# Patient Record
Sex: Female | Born: 1980 | Race: White | Hispanic: No | Marital: Married | Smoking: Never smoker
Health system: Southern US, Community
[De-identification: ages and names within clinical notes are randomized; demographics above are authoritative.]

## PROBLEM LIST (undated history)

## (undated) ENCOUNTER — Emergency Department (HOSPITAL_COMMUNITY): Admission: EM | Discharge status: Against Medical Advice | Payer: 59

## (undated) DIAGNOSIS — N879 Dysplasia of cervix uteri, unspecified: Secondary | ICD-10-CM

## (undated) DIAGNOSIS — B3731 Acute candidiasis of vulva and vagina: Secondary | ICD-10-CM

## (undated) DIAGNOSIS — E669 Obesity, unspecified: Secondary | ICD-10-CM

## (undated) DIAGNOSIS — K219 Gastro-esophageal reflux disease without esophagitis: Secondary | ICD-10-CM

## (undated) DIAGNOSIS — B373 Candidiasis of vulva and vagina: Secondary | ICD-10-CM

## (undated) DIAGNOSIS — D649 Anemia, unspecified: Secondary | ICD-10-CM

## (undated) DIAGNOSIS — J189 Pneumonia, unspecified organism: Secondary | ICD-10-CM

## (undated) DIAGNOSIS — F419 Anxiety disorder, unspecified: Secondary | ICD-10-CM

## (undated) DIAGNOSIS — R002 Palpitations: Secondary | ICD-10-CM

## (undated) DIAGNOSIS — N390 Urinary tract infection, site not specified: Secondary | ICD-10-CM

## (undated) DIAGNOSIS — R42 Dizziness and giddiness: Secondary | ICD-10-CM

## (undated) DIAGNOSIS — R7303 Prediabetes: Secondary | ICD-10-CM

## (undated) DIAGNOSIS — Z8489 Family history of other specified conditions: Secondary | ICD-10-CM

## (undated) DIAGNOSIS — E559 Vitamin D deficiency, unspecified: Secondary | ICD-10-CM

## (undated) HISTORY — DX: Obesity, unspecified: E66.9

## (undated) HISTORY — DX: Vitamin D deficiency, unspecified: E55.9

## (undated) HISTORY — DX: Dysplasia of cervix uteri, unspecified: N87.9

## (undated) HISTORY — DX: Candidiasis of vulva and vagina: B37.3

## (undated) HISTORY — PX: TUBAL LIGATION: SHX77

## (undated) HISTORY — DX: Urinary tract infection, site not specified: N39.0

## (undated) HISTORY — DX: Anxiety disorder, unspecified: F41.9

## (undated) HISTORY — DX: Acute candidiasis of vulva and vagina: B37.31

## (undated) HISTORY — DX: Palpitations: R00.2

## (undated) HISTORY — PX: COLPOSCOPY: SHX161

---
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
## 2001-04-18 ENCOUNTER — Other Ambulatory Visit: Admission: RE | Admit: 2001-04-18 | Discharge: 2001-04-18 | Payer: Self-pay | Admitting: Gynecology

## 2002-05-02 ENCOUNTER — Other Ambulatory Visit: Admission: RE | Admit: 2002-05-02 | Discharge: 2002-05-02 | Payer: Self-pay | Admitting: Gynecology

## 2003-05-07 ENCOUNTER — Other Ambulatory Visit: Admission: RE | Admit: 2003-05-07 | Discharge: 2003-05-07 | Payer: Self-pay | Admitting: Gynecology

## 2004-06-03 ENCOUNTER — Other Ambulatory Visit: Admission: RE | Admit: 2004-06-03 | Discharge: 2004-06-03 | Payer: Self-pay | Admitting: Gynecology

## 2005-06-08 ENCOUNTER — Other Ambulatory Visit: Admission: RE | Admit: 2005-06-08 | Discharge: 2005-06-08 | Payer: Self-pay | Admitting: Gynecology

## 2005-12-27 ENCOUNTER — Encounter (INDEPENDENT_AMBULATORY_CARE_PROVIDER_SITE_OTHER): Payer: Self-pay | Admitting: *Deleted

## 2005-12-27 ENCOUNTER — Inpatient Hospital Stay (HOSPITAL_COMMUNITY): Admission: RE | Admit: 2005-12-27 | Discharge: 2005-12-30 | Payer: Self-pay | Admitting: Obstetrics & Gynecology

## 2005-12-31 ENCOUNTER — Encounter: Admission: RE | Admit: 2005-12-31 | Discharge: 2006-01-08 | Payer: Self-pay | Admitting: Gynecology

## 2006-02-07 ENCOUNTER — Other Ambulatory Visit: Admission: RE | Admit: 2006-02-07 | Discharge: 2006-02-07 | Payer: Self-pay | Admitting: Gynecology

## 2007-03-25 ENCOUNTER — Other Ambulatory Visit: Admission: RE | Admit: 2007-03-25 | Discharge: 2007-03-25 | Payer: Self-pay | Admitting: Gynecology

## 2007-09-17 ENCOUNTER — Emergency Department (HOSPITAL_COMMUNITY): Admission: EM | Admit: 2007-09-17 | Discharge: 2007-09-17 | Payer: Self-pay | Admitting: Emergency Medicine

## 2007-09-19 ENCOUNTER — Other Ambulatory Visit: Admission: RE | Admit: 2007-09-19 | Discharge: 2007-09-19 | Payer: Self-pay | Admitting: Gynecology

## 2007-12-22 ENCOUNTER — Emergency Department (HOSPITAL_COMMUNITY): Admission: EM | Admit: 2007-12-22 | Discharge: 2007-12-22 | Payer: Self-pay | Admitting: Family Medicine

## 2008-04-09 ENCOUNTER — Other Ambulatory Visit: Admission: RE | Admit: 2008-04-09 | Discharge: 2008-04-09 | Payer: Self-pay | Admitting: Gynecology

## 2008-04-10 ENCOUNTER — Emergency Department (HOSPITAL_COMMUNITY): Admission: EM | Admit: 2008-04-10 | Discharge: 2008-04-11 | Payer: Self-pay | Admitting: Emergency Medicine

## 2008-09-11 ENCOUNTER — Ambulatory Visit: Payer: Self-pay | Admitting: Gynecology

## 2008-10-08 ENCOUNTER — Ambulatory Visit: Payer: Self-pay | Admitting: Women's Health

## 2009-01-08 ENCOUNTER — Ambulatory Visit: Payer: Self-pay | Admitting: Women's Health

## 2009-02-15 ENCOUNTER — Ambulatory Visit: Payer: Self-pay | Admitting: Women's Health

## 2009-04-02 ENCOUNTER — Ambulatory Visit: Payer: Self-pay | Admitting: Women's Health

## 2009-04-06 ENCOUNTER — Ambulatory Visit: Payer: Self-pay | Admitting: Women's Health

## 2009-04-09 ENCOUNTER — Ambulatory Visit: Payer: Self-pay | Admitting: Women's Health

## 2009-04-14 ENCOUNTER — Ambulatory Visit: Payer: Self-pay | Admitting: Gynecology

## 2009-04-21 ENCOUNTER — Inpatient Hospital Stay (HOSPITAL_COMMUNITY): Admission: AD | Admit: 2009-04-21 | Discharge: 2009-04-21 | Payer: Self-pay | Admitting: Obstetrics and Gynecology

## 2009-04-28 ENCOUNTER — Inpatient Hospital Stay (HOSPITAL_COMMUNITY): Admission: AD | Admit: 2009-04-28 | Discharge: 2009-04-28 | Payer: Self-pay | Admitting: Obstetrics and Gynecology

## 2009-11-08 ENCOUNTER — Ambulatory Visit: Payer: Self-pay | Admitting: Women's Health

## 2009-11-10 ENCOUNTER — Ambulatory Visit: Payer: Self-pay | Admitting: Women's Health

## 2009-12-10 ENCOUNTER — Ambulatory Visit: Payer: Self-pay | Admitting: Women's Health

## 2010-03-03 ENCOUNTER — Ambulatory Visit (HOSPITAL_COMMUNITY): Admission: RE | Admit: 2010-03-03 | Discharge: 2010-03-03 | Payer: Self-pay | Admitting: Obstetrics and Gynecology

## 2010-04-01 ENCOUNTER — Ambulatory Visit (HOSPITAL_COMMUNITY): Admission: RE | Admit: 2010-04-01 | Discharge: 2010-04-01 | Payer: Self-pay | Admitting: Obstetrics and Gynecology

## 2010-06-04 ENCOUNTER — Ambulatory Visit: Payer: Self-pay | Admitting: Nurse Practitioner

## 2010-06-04 ENCOUNTER — Inpatient Hospital Stay (HOSPITAL_COMMUNITY): Admission: AD | Admit: 2010-06-04 | Discharge: 2010-06-04 | Payer: Self-pay | Admitting: Obstetrics and Gynecology

## 2010-06-20 ENCOUNTER — Inpatient Hospital Stay (HOSPITAL_COMMUNITY): Admission: AD | Admit: 2010-06-20 | Discharge: 2010-06-20 | Payer: Self-pay | Admitting: Obstetrics and Gynecology

## 2010-06-23 ENCOUNTER — Inpatient Hospital Stay (HOSPITAL_COMMUNITY): Admission: AD | Admit: 2010-06-23 | Discharge: 2010-06-23 | Payer: Self-pay | Admitting: Obstetrics and Gynecology

## 2010-06-28 ENCOUNTER — Inpatient Hospital Stay (HOSPITAL_COMMUNITY): Admission: AD | Admit: 2010-06-28 | Discharge: 2010-06-28 | Payer: Self-pay | Admitting: Obstetrics and Gynecology

## 2010-06-28 DIAGNOSIS — O479 False labor, unspecified: Secondary | ICD-10-CM

## 2010-06-29 ENCOUNTER — Inpatient Hospital Stay (HOSPITAL_COMMUNITY): Admission: RE | Admit: 2010-06-29 | Discharge: 2010-07-02 | Payer: Self-pay | Admitting: Obstetrics and Gynecology

## 2010-07-02 ENCOUNTER — Encounter: Admission: RE | Admit: 2010-07-02 | Discharge: 2010-08-01 | Payer: Self-pay | Admitting: Obstetrics and Gynecology

## 2010-07-05 ENCOUNTER — Ambulatory Visit: Admission: RE | Admit: 2010-07-05 | Discharge: 2010-07-05 | Payer: Self-pay | Admitting: Obstetrics and Gynecology

## 2010-08-02 ENCOUNTER — Encounter: Admission: RE | Admit: 2010-08-02 | Discharge: 2010-08-02 | Payer: Self-pay | Admitting: Obstetrics and Gynecology

## 2010-09-02 ENCOUNTER — Encounter: Admission: RE | Admit: 2010-09-02 | Discharge: 2010-09-19 | Payer: Self-pay | Admitting: Obstetrics and Gynecology

## 2011-01-20 LAB — CBC
HCT: 27.2 % — ABNORMAL LOW (ref 36.0–46.0)
HCT: 33.3 % — ABNORMAL LOW (ref 36.0–46.0)
Hemoglobin: 11.2 g/dL — ABNORMAL LOW (ref 12.0–15.0)
Hemoglobin: 9.3 g/dL — ABNORMAL LOW (ref 12.0–15.0)
MCH: 30 pg (ref 26.0–34.0)
MCH: 30.2 pg (ref 26.0–34.0)
MCHC: 33.7 g/dL (ref 30.0–36.0)
MCHC: 34.1 g/dL (ref 30.0–36.0)
MCV: 88.6 fL (ref 78.0–100.0)
MCV: 89 fL (ref 78.0–100.0)
Platelets: 202 10*3/uL (ref 150–400)
Platelets: 247 10*3/uL (ref 150–400)
RBC: 3.07 MIL/uL — ABNORMAL LOW (ref 3.87–5.11)
RBC: 3.74 MIL/uL — ABNORMAL LOW (ref 3.87–5.11)
RDW: 15.1 % (ref 11.5–15.5)
RDW: 15.3 % (ref 11.5–15.5)
WBC: 10.4 10*3/uL (ref 4.0–10.5)
WBC: 9.3 10*3/uL (ref 4.0–10.5)

## 2011-01-20 LAB — RPR: RPR Ser Ql: NONREACTIVE

## 2011-01-20 LAB — SURGICAL PCR SCREEN
MRSA, PCR: NEGATIVE
Staphylococcus aureus: NEGATIVE

## 2011-01-21 LAB — GLUCOSE, CAPILLARY: Glucose-Capillary: 119 mg/dL — ABNORMAL HIGH (ref 70–99)

## 2011-01-21 LAB — BASIC METABOLIC PANEL
BUN: 5 mg/dL — ABNORMAL LOW (ref 6–23)
CO2: 25 mEq/L (ref 19–32)
Calcium: 8.8 mg/dL (ref 8.4–10.5)
Chloride: 105 mEq/L (ref 96–112)
Creatinine, Ser: 0.48 mg/dL (ref 0.4–1.2)
GFR calc Af Amer: >60 mL/min (ref >60)
GFR calc non Af Amer: >60 mL/min (ref >60)
Glucose, Bld: 116 mg/dL — ABNORMAL HIGH (ref 70–99)
Potassium: 3.7 mEq/L (ref 3.5–5.1)
Sodium: 135 mEq/L (ref 135–145)

## 2011-01-21 LAB — URINALYSIS, ROUTINE W REFLEX MICROSCOPIC
Bilirubin Urine: NEGATIVE
Glucose, UA: NEGATIVE mg/dL
Ketones, ur: 15 mg/dL — AB
Leukocytes, UA: NEGATIVE
Nitrite: NEGATIVE
Protein, ur: NEGATIVE mg/dL
Specific Gravity, Urine: 1.015 (ref 1.005–1.030)
Urobilinogen, UA: 0.2 mg/dL (ref 0.0–1.0)
pH: 7 (ref 5.0–8.0)

## 2011-01-21 LAB — CBC
HCT: 31 % — ABNORMAL LOW (ref 36.0–46.0)
Hemoglobin: 10.6 g/dL — ABNORMAL LOW (ref 12.0–15.0)
MCH: 30.1 pg (ref 26.0–34.0)
MCHC: 34.1 g/dL (ref 30.0–36.0)
MCV: 88.5 fL (ref 78.0–100.0)
Platelets: 245 10*3/uL (ref 150–400)
RBC: 3.5 MIL/uL — ABNORMAL LOW (ref 3.87–5.11)
RDW: 14.8 % (ref 11.5–15.5)
WBC: 9.9 10*3/uL (ref 4.0–10.5)

## 2011-01-21 LAB — URINE MICROSCOPIC-ADD ON

## 2011-02-13 LAB — CBC
HCT: 34.2 % — ABNORMAL LOW (ref 36.0–46.0)
HCT: 35.4 % — ABNORMAL LOW (ref 36.0–46.0)
Hemoglobin: 11.9 g/dL — ABNORMAL LOW (ref 12.0–15.0)
Hemoglobin: 12.3 g/dL (ref 12.0–15.0)
MCHC: 34.7 g/dL (ref 30.0–36.0)
MCHC: 34.9 g/dL (ref 30.0–36.0)
MCV: 90.8 fL (ref 78.0–100.0)
MCV: 92.5 fL (ref 78.0–100.0)
Platelets: 277 10*3/uL (ref 150–400)
Platelets: 283 10*3/uL (ref 150–400)
RBC: 3.76 MIL/uL — ABNORMAL LOW (ref 3.87–5.11)
RBC: 3.83 MIL/uL — ABNORMAL LOW (ref 3.87–5.11)
RDW: 13 % (ref 11.5–15.5)
RDW: 13.5 % (ref 11.5–15.5)
WBC: 10.7 10*3/uL — ABNORMAL HIGH (ref 4.0–10.5)
WBC: 9.8 10*3/uL (ref 4.0–10.5)

## 2011-02-13 LAB — BUN: BUN: 5 mg/dL — ABNORMAL LOW (ref 6–23)

## 2011-02-13 LAB — COMPREHENSIVE METABOLIC PANEL
ALT: 18 U/L (ref 0–35)
AST: 20 U/L (ref 0–37)
Albumin: 3.8 g/dL (ref 3.5–5.2)
Alkaline Phosphatase: 67 U/L (ref 39–117)
BUN: 6 mg/dL (ref 6–23)
CO2: 26 mEq/L (ref 19–32)
Calcium: 9 mg/dL (ref 8.4–10.5)
Chloride: 107 mEq/L (ref 96–112)
Creatinine, Ser: 0.69 mg/dL (ref 0.4–1.2)
GFR calc Af Amer: >60 mL/min (ref >60)
GFR calc non Af Amer: >60 mL/min (ref >60)
Glucose, Bld: 90 mg/dL (ref 70–99)
Potassium: 3.9 mEq/L (ref 3.5–5.1)
Sodium: 138 mEq/L (ref 135–145)
Total Bilirubin: 0.6 mg/dL (ref 0.3–1.2)
Total Protein: 6.7 g/dL (ref 6.0–8.3)

## 2011-02-13 LAB — DIFFERENTIAL
Basophils Absolute: 0 10*3/uL (ref 0.0–0.1)
Basophils Absolute: 0.1 10*3/uL (ref 0.0–0.1)
Basophils Relative: 0 % (ref 0–1)
Basophils Relative: 1 % (ref 0–1)
Eosinophils Absolute: 0.1 10*3/uL (ref 0.0–0.7)
Eosinophils Absolute: 0.2 10*3/uL (ref 0.0–0.7)
Eosinophils Relative: 1 % (ref 0–5)
Eosinophils Relative: 2 % (ref 0–5)
Lymphocytes Relative: 26 % (ref 12–46)
Lymphocytes Relative: 30 % (ref 12–46)
Lymphs Abs: 2.8 10*3/uL (ref 0.7–4.0)
Lymphs Abs: 2.9 10*3/uL (ref 0.7–4.0)
Monocytes Absolute: 0.4 10*3/uL (ref 0.1–1.0)
Monocytes Absolute: 0.4 10*3/uL (ref 0.1–1.0)
Monocytes Relative: 4 % (ref 3–12)
Monocytes Relative: 5 % (ref 3–12)
Neutro Abs: 6.2 10*3/uL (ref 1.7–7.7)
Neutro Abs: 7.3 10*3/uL (ref 1.7–7.7)
Neutrophils Relative %: 64 % (ref 43–77)
Neutrophils Relative %: 68 % (ref 43–77)

## 2011-02-13 LAB — CREATININE, SERUM
Creatinine, Ser: 0.67 mg/dL (ref 0.4–1.2)
GFR calc Af Amer: >60 mL/min (ref >60)
GFR calc non Af Amer: >60 mL/min (ref >60)

## 2011-02-13 LAB — ABO/RH: ABO/RH(D): A POS

## 2011-02-13 LAB — HCG, QUANTITATIVE, PREGNANCY: hCG, Beta Chain, Quant, S: 252 m[IU]/mL — ABNORMAL HIGH (ref <5)

## 2011-02-13 LAB — AST: AST: 24 U/L (ref 0–37)

## 2011-02-17 ENCOUNTER — Ambulatory Visit (INDEPENDENT_AMBULATORY_CARE_PROVIDER_SITE_OTHER): Payer: 59 | Admitting: Women's Health

## 2011-02-17 DIAGNOSIS — N644 Mastodynia: Secondary | ICD-10-CM

## 2011-02-17 DIAGNOSIS — N949 Unspecified condition associated with female genital organs and menstrual cycle: Secondary | ICD-10-CM

## 2011-03-24 NOTE — Discharge Summary (Signed)
NAME:  Julie Mcneil, Julie Mcneil              ACCOUNT NO.:  [id number]   MEDICAL RECORD NO.:  [id number]          PATIENT TYPE:  INP   LOCATION:  9130                          FACILITY:  [facility]   PHYSICIAN:  Ivor Costa. Farrel Gobble, M.D. DATE OF BIRTH:  06-03-1981   DATE OF ADMISSION:  12/27/2005  DATE OF DISCHARGE:  12/30/2005                                 DISCHARGE SUMMARY   PRINCIPAL DIAGNOSIS:  Macrosomia.   PRINCIPAL PROCEDURE:  Elective primary cesarean section.   HOSPITAL COURSE:  The patient presented in the afternoon of December 27, 2005 for an elective primary cesarean section secondary to suspected  macrosomia that was picked up on a 33-week ultrasound; at  which point, the  infant was noted to be 6 pounds 4 ounces and greater than 97th percentile in  almost all the diameters. She underwent the cesarean under spinal anesthesia  for delivery of a viable female with a floating vertex. Apgar's 9/9, birth  weight 10 pounds 2 ounces. Normal uterus, tubes and ovaries with an  estimated blood loss of approximately 600 cc.   Her postpartum course was unremarkable. By postpartum day #3, the patient  was ready for discharge. She was breast-feeding without any difficulty. Her  pain was well-controlled with oral analgesia. She was walking without any  problem and was tolerating regular diet.   She remained afebrile and her vitals were stable throughout. On examination,  she was well-appearing. Her abdomen was obese, soft and nontender. Incision  was clean, dry, intact with both staples and Steri-Strips. Her uterus was  difficult to palpate secondary to habitus. The extremities were negative.  The patient was discharged home with instructions to follow up in the office  in six weeks. She had been given Tylox preoperatively. She will use over-the-  counter Motrin as needed. Discharge instructions were reviewed with the  patient.      Ivor Costa. Farrel Gobble, M.D.  Electronically Signed     THL/MEDQ   D:  12/30/2005  T:  12/30/2005  Job:  [id number]

## 2011-03-24 NOTE — Op Note (Signed)
NAME:  Julie Mcneil, Julie Mcneil              ACCOUNT NO.:  [id number]   MEDICAL RECORD NO.:  [id number]          PATIENT TYPE:  INP   LOCATION:  9130                          FACILITY:  [facility]   PHYSICIAN:  Ivor Costa. Farrel Gobble, M.D. DATE OF BIRTH:  1980-12-27   DATE OF PROCEDURE:  12/27/2005  DATE OF DISCHARGE:                                 OPERATIVE REPORT   PREOPERATIVE DIAGNOSIS:  Macrosomia.   POSTOPERATIVE DIAGNOSIS:  Macrosomia.   OPERATION/PROCEDURE:  Primary cesarean section, low flap, transverse.   SURGEON:  Ivor Costa. Farrel Gobble, M.D.   ASSISTANTMarcial Pacas P. Fontaine, M.D.   ANESTHESIA:  Spinal.   FLUIDS REPLACED:  2800 mL lactated Ringer's.   ESTIMATED BLOOD LOSS:  600 mL.   URINE OUTPUT:  100 mL of clear urine.   FINDINGS:  Viable female with a floating vertex, clear amniotic fluid.  Apgars 9 and 9, birth weight 10 pounds 2 ounces.  Normal uterus, tubes and  ovaries.   COMPLICATIONS:  None.   DESCRIPTION OF PROCEDURE:  The patient was taken to the operating room,  spinal anesthesia induced, placed in the supine position with left lateral  displacement, prepped and draped in the usual sterile fashion.  A  Pfannenstiel skin incision was made with scalpel and carried through to the  fascia which was scored in the midline and extended laterally.  The inferior  aspect of the fascial incision was grasped with the Kochers.  Underlying  rectus muscles were dissected off with blunt and sharp dissection.  In  similar fashion, the superior aspect of incision was grasped with Kochers  and underlying rectus muscles were dissected off.  The rectus muscles were  naturally separated in the midline.  The peritoneum was identified and  entered bluntly.  The incision was then extended superiorly and inferiorly.  Good visualization of the underlying bowel and bladder.  Bladder blade was  inserted.  The vesicouterine peritoneum was identified, tented up and  entered sharply with the Metzenbaums.   Bladder flap was created digitally.  The bladder blade was reinserted and the lower uterine segment was incised  in a transverse fashion with the scalpel.  Copious amount of clear amniotic  fluid was noted.  The infant was delivered with the aid of baby Elliots when  the vertex started floating away.  The cord was cut and clamped and the  infant handed off to the waiting pediatricians.  Cord bloods were obtained.  The uterus was massaged and placenta was allowed to separate naturally.  The  uterus was then cleared of all clots and debris.  The uterine incision was  repaired with running locked layer of 0 chromic and a second suture was used  for imbrication.  The pelvis was irrigated with copious amounts of warm  saline.  The adnexa were inspected and noted to be unremarkable as was the  remainder of the pelvis.  The fascia, peritoneum and muscles were inspected  and were noted to be hemostatic.  The fascia was closed with 0 Vicryl in a  running fashion.  The subcutaneous tissue was reapproximated with 3-0 plain.  Skin was closed  with staples.  The patient tolerated the procedure well.  Sponge, lap and  needle counts were correct x2.  She was transferred to the PACU in stable  condition.      Ivor Costa. Farrel Gobble, M.D.  Electronically Signed     THL/MEDQ  D:  12/27/2005  T:  12/28/2005  Job:  [id number]

## 2011-04-04 ENCOUNTER — Ambulatory Visit (INDEPENDENT_AMBULATORY_CARE_PROVIDER_SITE_OTHER): Payer: 59 | Admitting: Gynecology

## 2011-04-04 DIAGNOSIS — N912 Amenorrhea, unspecified: Secondary | ICD-10-CM

## 2011-04-06 ENCOUNTER — Other Ambulatory Visit (INDEPENDENT_AMBULATORY_CARE_PROVIDER_SITE_OTHER): Payer: 59

## 2011-04-06 DIAGNOSIS — O9989 Other specified diseases and conditions complicating pregnancy, childbirth and the puerperium: Secondary | ICD-10-CM

## 2011-04-07 ENCOUNTER — Other Ambulatory Visit: Payer: 59

## 2011-04-12 ENCOUNTER — Other Ambulatory Visit (INDEPENDENT_AMBULATORY_CARE_PROVIDER_SITE_OTHER): Payer: 59

## 2011-04-12 DIAGNOSIS — N912 Amenorrhea, unspecified: Secondary | ICD-10-CM

## 2011-04-14 ENCOUNTER — Ambulatory Visit (INDEPENDENT_AMBULATORY_CARE_PROVIDER_SITE_OTHER): Payer: 59 | Admitting: Gynecology

## 2011-04-14 ENCOUNTER — Inpatient Hospital Stay (HOSPITAL_COMMUNITY)
Admission: AD | Admit: 2011-04-14 | Discharge: 2011-04-14 | Disposition: A | Discharge status: Home / Self Care | Payer: 59 | Source: Ambulatory Visit | Attending: Obstetrics and Gynecology | Admitting: Obstetrics and Gynecology

## 2011-04-14 ENCOUNTER — Inpatient Hospital Stay (HOSPITAL_COMMUNITY): Payer: 59

## 2011-04-14 DIAGNOSIS — O9989 Other specified diseases and conditions complicating pregnancy, childbirth and the puerperium: Secondary | ICD-10-CM

## 2011-04-14 DIAGNOSIS — O200 Threatened abortion: Secondary | ICD-10-CM

## 2011-04-14 DIAGNOSIS — O99891 Other specified diseases and conditions complicating pregnancy: Secondary | ICD-10-CM | POA: Insufficient documentation

## 2011-04-14 DIAGNOSIS — R1031 Right lower quadrant pain: Secondary | ICD-10-CM | POA: Insufficient documentation

## 2011-04-14 LAB — URINALYSIS, ROUTINE W REFLEX MICROSCOPIC
Bilirubin Urine: NEGATIVE
Glucose, UA: NEGATIVE mg/dL
Ketones, ur: NEGATIVE mg/dL
Leukocytes, UA: NEGATIVE
Nitrite: NEGATIVE
Protein, ur: NEGATIVE mg/dL
Specific Gravity, Urine: >1.03 — ABNORMAL HIGH (ref 1.005–1.030)
Urobilinogen, UA: 0.2 mg/dL (ref 0.0–1.0)
pH: 5.5 (ref 5.0–8.0)

## 2011-04-14 LAB — URINE MICROSCOPIC-ADD ON

## 2011-04-14 LAB — POCT PREGNANCY, URINE: Preg Test, Ur: POSITIVE

## 2011-04-21 ENCOUNTER — Other Ambulatory Visit: Payer: 59

## 2011-04-21 ENCOUNTER — Ambulatory Visit (INDEPENDENT_AMBULATORY_CARE_PROVIDER_SITE_OTHER): Payer: 59 | Admitting: Women's Health

## 2011-04-21 DIAGNOSIS — O26849 Uterine size-date discrepancy, unspecified trimester: Secondary | ICD-10-CM

## 2011-04-21 DIAGNOSIS — N912 Amenorrhea, unspecified: Secondary | ICD-10-CM

## 2011-04-24 ENCOUNTER — Ambulatory Visit: Payer: 59 | Admitting: Women's Health

## 2011-04-24 ENCOUNTER — Other Ambulatory Visit: Payer: 59

## 2011-05-01 LAB — RUBELLA ANTIBODY, IGM: Rubella: IMMUNE

## 2011-05-01 LAB — ABO/RH: RH Type: POSITIVE

## 2011-05-01 LAB — ANTIBODY SCREEN: Antibody Screen: NEGATIVE

## 2011-05-01 LAB — RPR: RPR: NONREACTIVE

## 2011-05-01 LAB — HIV ANTIBODY (ROUTINE TESTING W REFLEX): HIV: NONREACTIVE

## 2011-05-01 LAB — HEPATITIS B SURFACE ANTIGEN: Hepatitis B Surface Ag: NEGATIVE

## 2011-05-11 ENCOUNTER — Inpatient Hospital Stay (HOSPITAL_COMMUNITY)
Admission: AD | Admit: 2011-05-11 | Discharge: 2011-05-11 | Disposition: A | Discharge status: Home / Self Care | Payer: 59 | Source: Ambulatory Visit | Attending: Obstetrics and Gynecology | Admitting: Obstetrics and Gynecology

## 2011-05-11 ENCOUNTER — Inpatient Hospital Stay (HOSPITAL_COMMUNITY): Payer: 59

## 2011-05-11 DIAGNOSIS — O9989 Other specified diseases and conditions complicating pregnancy, childbirth and the puerperium: Secondary | ICD-10-CM

## 2011-05-11 DIAGNOSIS — R109 Unspecified abdominal pain: Secondary | ICD-10-CM

## 2011-05-11 DIAGNOSIS — O99891 Other specified diseases and conditions complicating pregnancy: Secondary | ICD-10-CM | POA: Insufficient documentation

## 2011-05-11 LAB — URINALYSIS, ROUTINE W REFLEX MICROSCOPIC
Bilirubin Urine: NEGATIVE
Glucose, UA: NEGATIVE mg/dL
Ketones, ur: NEGATIVE mg/dL
Leukocytes, UA: NEGATIVE
Nitrite: NEGATIVE
Protein, ur: NEGATIVE mg/dL
Specific Gravity, Urine: >1.03 — ABNORMAL HIGH (ref 1.005–1.030)
Urobilinogen, UA: 0.2 mg/dL (ref 0.0–1.0)
pH: 6 (ref 5.0–8.0)

## 2011-05-11 LAB — URINE MICROSCOPIC-ADD ON

## 2011-06-12 ENCOUNTER — Encounter (HOSPITAL_COMMUNITY): Payer: Self-pay | Admitting: *Deleted

## 2011-06-12 ENCOUNTER — Inpatient Hospital Stay (HOSPITAL_COMMUNITY)
Admission: AD | Admit: 2011-06-12 | Discharge: 2011-06-13 | Disposition: A | Discharge status: Home / Self Care | Payer: 59 | Source: Ambulatory Visit | Attending: Obstetrics and Gynecology | Admitting: Obstetrics and Gynecology

## 2011-06-12 DIAGNOSIS — O99891 Other specified diseases and conditions complicating pregnancy: Secondary | ICD-10-CM | POA: Insufficient documentation

## 2011-06-12 DIAGNOSIS — R51 Headache: Secondary | ICD-10-CM | POA: Insufficient documentation

## 2011-06-12 DIAGNOSIS — J329 Chronic sinusitis, unspecified: Secondary | ICD-10-CM

## 2011-06-12 DIAGNOSIS — O210 Mild hyperemesis gravidarum: Secondary | ICD-10-CM | POA: Insufficient documentation

## 2011-06-12 DIAGNOSIS — R109 Unspecified abdominal pain: Secondary | ICD-10-CM | POA: Insufficient documentation

## 2011-06-12 DIAGNOSIS — O219 Vomiting of pregnancy, unspecified: Secondary | ICD-10-CM

## 2011-06-12 LAB — COMPREHENSIVE METABOLIC PANEL
ALT: 8 U/L (ref 0–35)
AST: 9 U/L (ref 0–37)
Albumin: 3.1 g/dL — ABNORMAL LOW (ref 3.5–5.2)
Alkaline Phosphatase: 99 U/L (ref 39–117)
BUN: 13 mg/dL (ref 6–23)
CO2: 27 mEq/L (ref 19–32)
Calcium: 10.2 mg/dL (ref 8.4–10.5)
Chloride: 100 mEq/L (ref 96–112)
Creatinine, Ser: 0.63 mg/dL (ref 0.50–1.10)
GFR calc Af Amer: >60 mL/min (ref >60)
GFR calc non Af Amer: >60 mL/min (ref >60)
Glucose, Bld: 97 mg/dL (ref 70–99)
Potassium: 4 mEq/L (ref 3.5–5.1)
Sodium: 137 mEq/L (ref 135–145)
Total Bilirubin: 0.3 mg/dL (ref 0.3–1.2)
Total Protein: 7.2 g/dL (ref 6.0–8.3)

## 2011-06-12 LAB — URINE MICROSCOPIC-ADD ON

## 2011-06-12 LAB — DIFFERENTIAL
Basophils Absolute: 0 10*3/uL (ref 0.0–0.1)
Basophils Relative: 0 % (ref 0–1)
Eosinophils Absolute: 0.2 10*3/uL (ref 0.0–0.7)
Eosinophils Relative: 1 % (ref 0–5)
Lymphocytes Relative: 20 % (ref 12–46)
Lymphs Abs: 2.8 10*3/uL (ref 0.7–4.0)
Monocytes Absolute: 0.5 10*3/uL (ref 0.1–1.0)
Monocytes Relative: 4 % (ref 3–12)
Neutro Abs: 10.7 10*3/uL — ABNORMAL HIGH (ref 1.7–7.7)
Neutrophils Relative %: 75 % (ref 43–77)

## 2011-06-12 LAB — CBC
HCT: 34.1 % — ABNORMAL LOW (ref 36.0–46.0)
Hemoglobin: 11.7 g/dL — ABNORMAL LOW (ref 12.0–15.0)
MCH: 30.6 pg (ref 26.0–34.0)
MCHC: 34.3 g/dL (ref 30.0–36.0)
MCV: 89.3 fL (ref 78.0–100.0)
Platelets: 261 10*3/uL (ref 150–400)
RBC: 3.82 MIL/uL — ABNORMAL LOW (ref 3.87–5.11)
RDW: 13.6 % (ref 11.5–15.5)
WBC: 14.3 10*3/uL — ABNORMAL HIGH (ref 4.0–10.5)

## 2011-06-12 LAB — URINALYSIS, ROUTINE W REFLEX MICROSCOPIC
Bilirubin Urine: NEGATIVE
Glucose, UA: NEGATIVE mg/dL
Ketones, ur: NEGATIVE mg/dL
Leukocytes, UA: NEGATIVE
Nitrite: NEGATIVE
Protein, ur: NEGATIVE mg/dL
Specific Gravity, Urine: >1.03 — ABNORMAL HIGH (ref 1.005–1.030)
Urobilinogen, UA: 0.2 mg/dL (ref 0.0–1.0)
pH: 6 (ref 5.0–8.0)

## 2011-06-12 MED ORDER — ONDANSETRON 8 MG PO TBDP
8.0000 mg | ORAL_TABLET | Freq: Once | ORAL | Status: AC
  Administered 2011-06-12: 8 mg via ORAL
  Filled 2011-06-12: qty 1

## 2011-06-12 MED ORDER — ONDANSETRON HCL 4 MG/2ML IJ SOLN: 4.0000 mg | Freq: Once | INTRAMUSCULAR | Status: DC

## 2011-06-12 MED ORDER — ACETAMINOPHEN 500 MG PO TABS
1000.0000 mg | ORAL_TABLET | Freq: Once | ORAL | Status: AC
  Administered 2011-06-13: 1000 mg via ORAL
  Filled 2011-06-12: qty 2

## 2011-06-12 MED ORDER — LACTATED RINGERS IV SOLN: INTRAVENOUS | Status: DC

## 2011-06-12 NOTE — ED Provider Notes (Addendum)
History     CSN: [id number] Arrival date & time: 06/12/2011  8:51 PM  Chief Complaint  Patient presents with  . Headache  . Abdominal Cramping   HPI Julie Mcneil is a 30 y.o. WF at 14.[redacted] weeks gestation presents to MAU with a headache that started 4 days ago. Took tylenol and helped a little but seems to get worse at night. Also took tylenol sinus. Tonight began feeling dizzy, nausea, and lower abdominal cramping while outside.     Past Medical History  Diagnosis Date  . Vaginal yeast infection     recurrent  . UTI (urinary tract infection)     recurrent    Past Surgical History  Procedure Date  . Cesarean section     No family history on file.  History  Substance Use Topics  . Smoking status: Never Smoker   . Smokeless tobacco: Not on file  . Alcohol Use: No    OB History    Grav Para Term Preterm Abortions TAB SAB Ect Mult Living   4 2 2  1   1  2       Review of Systems  Constitutional: Positive for fever and fatigue. Negative for chills.  HENT: Positive for sinus pressure. Negative for ear pain, facial swelling and neck pain.   Eyes: Negative.   Respiratory: Negative for cough and wheezing.   Genitourinary: Negative for vaginal bleeding, vaginal discharge and pelvic pain.    Physical Exam  BP 109/70  Pulse 101  Temp(Src) 98.7 F (37.1 C) (Oral)  Resp 16  Ht 5\' 4"  (1.626 m)  Wt 239 lb 6.4 oz (108.591 kg)  BMI 41.09 kg/m2  Physical Exam  Nursing note and vitals reviewed. Constitutional: She is oriented to person, place, and time. She appears well-developed and well-nourished.  HENT:  Head: Normocephalic and atraumatic.         Tenderness over maxillary and frontal sinuses on the left.  Eyes: EOM are normal. Pupils are equal, round, and reactive to light.  Neck: Neck supple.  Cardiovascular: Normal rate and regular rhythm.   Pulmonary/Chest: No respiratory distress. She has no wheezes.  Abdominal: Soft. There is no tenderness.       Gravid,  fundal ht. Consistent with dates. +FHT  Musculoskeletal: Normal range of motion. She exhibits no edema.  Neurological: She is alert and oriented to person, place, and time. No cranial nerve deficit.  Skin: Skin is warm and dry.  Psychiatric: She has a normal mood and affect.    ED Course  Procedures Urine SG > 30.  CBC, CMET, WNL  Assessment: Sinusitis                        Low abdominal cramping in 2nd trimester pregnancy                        Nausea                        Headache  Plan:     Consult with Dr. Henderson Cloud               Tylenol for headache                Rx:       Zofran 8 mg. Po bid, prn             Amoxicillin 500 mg. Po tid x  10 days             Increase po fluids             Follow up in the office.                      [place], [place] 06/13/11 [phone]

## 2011-06-12 NOTE — Progress Notes (Signed)
PT SAYS SHE HAS HAD H/A  FOR 5 DAYS AND AT NIGHT AND SINCE STORM - IT IS WORSE AND  ACROSS FOREHEAD.   FEELS NAUSEA BECAUSE OF H/A     . CRAMPS STARTED  AT 6PM- WHILE SHE WAS OUTSIDE- WENT AND LAYED DOWN-  HELPED SOME. - CRAMPING LESS NOW.   CALLED OFFICE ON Friday ABOUT H/A-  TOLD HER TO TAKE TYLENOL SINUS-    HAS HELPED SOME- IN EVENING H/A WORSE.

## 2011-06-12 NOTE — Progress Notes (Signed)
PT REFUSED IV - DOES NOT LIKE NEEDLES-  AND HAD RATHER  NOT HAVE IV

## 2011-06-12 NOTE — Progress Notes (Signed)
Pt G4 P2 at 15wks, having headache x 5 days and lower abd cramping since 1800.  Denies bleeding or discharge.

## 2011-06-13 MED ORDER — AMOXICILLIN 500 MG PO CAPS: 500.0000 mg | ORAL_CAPSULE | Freq: Three times a day (TID) | ORAL | Status: AC

## 2011-06-13 MED ORDER — ONDANSETRON HCL 4 MG PO TABS: 4.0000 mg | ORAL_TABLET | Freq: Four times a day (QID) | ORAL | Status: AC

## 2011-07-07 ENCOUNTER — Encounter (HOSPITAL_COMMUNITY): Payer: Self-pay

## 2011-07-07 ENCOUNTER — Inpatient Hospital Stay (HOSPITAL_COMMUNITY): Payer: 59

## 2011-07-07 ENCOUNTER — Inpatient Hospital Stay (HOSPITAL_COMMUNITY)
Admission: AD | Admit: 2011-07-07 | Discharge: 2011-07-07 | Disposition: A | Discharge status: Home / Self Care | Payer: 59 | Source: Ambulatory Visit | Attending: Obstetrics and Gynecology | Admitting: Obstetrics and Gynecology

## 2011-07-07 DIAGNOSIS — B9689 Other specified bacterial agents as the cause of diseases classified elsewhere: Secondary | ICD-10-CM | POA: Insufficient documentation

## 2011-07-07 DIAGNOSIS — O2390 Unspecified genitourinary tract infection in pregnancy, unspecified trimester: Secondary | ICD-10-CM | POA: Insufficient documentation

## 2011-07-07 DIAGNOSIS — N760 Acute vaginitis: Secondary | ICD-10-CM | POA: Insufficient documentation

## 2011-07-07 DIAGNOSIS — A499 Bacterial infection, unspecified: Secondary | ICD-10-CM

## 2011-07-07 LAB — WET PREP, GENITAL
Trich, Wet Prep: NONE SEEN
Yeast Wet Prep HPF POC: NONE SEEN

## 2011-07-07 LAB — POCT FERN TEST: Fern Test: NEGATIVE

## 2011-07-07 MED ORDER — METRONIDAZOLE 500 MG PO TABS: 500.0000 mg | ORAL_TABLET | Freq: Two times a day (BID) | ORAL | Status: AC
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
## 2011-07-07 NOTE — ED Provider Notes (Signed)
History     Chief Complaint  Patient presents with  . Vaginal Discharge   HPI  Pt is here with report of leaking of fluid at 1730;  Denies vaginal bleeding or contractions. +sexual intercourse last night.  +fetal movement.   Past Medical History  Diagnosis Date  . Vaginal yeast infection     recurrent  . UTI (urinary tract infection)     recurrent  . No pertinent past medical history     Past Surgical History  Procedure Date  . Cesarean section     No family history on file.  History  Substance Use Topics  . Smoking status: Never Smoker   . Smokeless tobacco: Not on file  . Alcohol Use: No    Allergies:  Allergies  Allergen Reactions  . Macrobid Nausea And Vomiting    Prescriptions prior to admission  Medication Sig Dispense Refill  . Prenatal Vit-Fe Sulfate-FA (PRENATAL VITAMIN PO) Take by mouth.        . pseudoephedrine-acetaminophen (TYLENOL SINUS) 30-500 MG TABS Take 1 tablet by mouth every 6 (six) hours as needed. headache         Review of Systems  Genitourinary:       +vaginal discharge  All other systems reviewed and are negative.   Physical Exam   Blood pressure 121/79, pulse 103, temperature 99.3 F (37.4 C), resp. rate 20, height 5\' 3"  (1.6 m), weight 111.131 kg (245 lb), unknown if currently breastfeeding.  Physical Exam  Constitutional: She is oriented to person, place, and time. She appears well-developed and well-nourished.  HENT:  Head: Normocephalic.  Neck: Normal range of motion. Neck supple.  GI: There is no tenderness.  Genitourinary: No bleeding around the vagina. Vaginal discharge (mucusy) found.       Negative ferns; no pooling seen; cervix closed  Neurological: She is alert and oriented to person, place, and time. She has normal reflexes.  Skin: Skin is warm and dry.    MAU Course  Procedures Consulted w/Dr. Holland>obtain AFI [place] - AFI 12 Wet Prep - Clue  Assessment and Plan  Bacterial Vaginosis  Plan: RX   Flagyl FU in office as scheduled  [facility] 07/07/2011, 8:17 PM

## 2011-07-07 NOTE — Consult Note (Signed)
Reviewed HPI/Exam/labs with Dr. Angelique Holm [place] to check AFI.

## 2011-08-03 LAB — POCT I-STAT, CHEM 8
BUN: 12
Calcium, Ion: 1.21
Chloride: 104
Creatinine, Ser: 1.1
Glucose, Bld: 97
HCT: 41
Hemoglobin: 13.9
Potassium: 3.7
Sodium: 138
TCO2: 25

## 2011-08-03 LAB — URINE MICROSCOPIC-ADD ON

## 2011-08-03 LAB — URINALYSIS, ROUTINE W REFLEX MICROSCOPIC
Bilirubin Urine: NEGATIVE
Glucose, UA: NEGATIVE
Ketones, ur: NEGATIVE
Leukocytes, UA: NEGATIVE
Nitrite: NEGATIVE
Protein, ur: NEGATIVE
Specific Gravity, Urine: 1.013
Urobilinogen, UA: 0.2
pH: 6.5

## 2011-08-03 LAB — PREGNANCY, URINE: Preg Test, Ur: NEGATIVE

## 2011-08-19 ENCOUNTER — Inpatient Hospital Stay (HOSPITAL_COMMUNITY)
Admission: AD | Admit: 2011-08-19 | Discharge: 2011-08-19 | Disposition: A | Discharge status: Home / Self Care | Payer: 59 | Source: Ambulatory Visit | Attending: Obstetrics and Gynecology | Admitting: Obstetrics and Gynecology

## 2011-08-19 ENCOUNTER — Encounter (HOSPITAL_COMMUNITY): Payer: Self-pay

## 2011-08-19 DIAGNOSIS — R1012 Left upper quadrant pain: Secondary | ICD-10-CM | POA: Insufficient documentation

## 2011-08-19 DIAGNOSIS — K219 Gastro-esophageal reflux disease without esophagitis: Secondary | ICD-10-CM | POA: Insufficient documentation

## 2011-08-19 DIAGNOSIS — O99891 Other specified diseases and conditions complicating pregnancy: Secondary | ICD-10-CM | POA: Insufficient documentation

## 2011-08-19 DIAGNOSIS — O212 Late vomiting of pregnancy: Secondary | ICD-10-CM | POA: Insufficient documentation

## 2011-08-19 LAB — CBC
HCT: 33.2 % — ABNORMAL LOW (ref 36.0–46.0)
Hemoglobin: 11 g/dL — ABNORMAL LOW (ref 12.0–15.0)
MCH: 30.4 pg (ref 26.0–34.0)
MCHC: 33.1 g/dL (ref 30.0–36.0)
MCV: 91.7 fL (ref 78.0–100.0)
Platelets: 224 10*3/uL (ref 150–400)
RBC: 3.62 MIL/uL — ABNORMAL LOW (ref 3.87–5.11)
RDW: 14.1 % (ref 11.5–15.5)
WBC: 8.9 10*3/uL (ref 4.0–10.5)

## 2011-08-19 LAB — COMPREHENSIVE METABOLIC PANEL
ALT: 7 U/L (ref 0–35)
AST: 10 U/L (ref 0–37)
Albumin: 2.7 g/dL — ABNORMAL LOW (ref 3.5–5.2)
Alkaline Phosphatase: 89 U/L (ref 39–117)
BUN: 7 mg/dL (ref 6–23)
CO2: 24 mEq/L (ref 19–32)
Calcium: 8.7 mg/dL (ref 8.4–10.5)
Chloride: 102 mEq/L (ref 96–112)
Creatinine, Ser: 0.47 mg/dL — ABNORMAL LOW (ref 0.50–1.10)
Glucose, Bld: 97 mg/dL (ref 70–99)
Potassium: 4 mEq/L (ref 3.5–5.1)
Sodium: 133 mEq/L — ABNORMAL LOW (ref 135–145)
Total Bilirubin: 0.5 mg/dL (ref 0.3–1.2)
Total Protein: 6.6 g/dL (ref 6.0–8.3)

## 2011-08-19 LAB — URINALYSIS, ROUTINE W REFLEX MICROSCOPIC
Bilirubin Urine: NEGATIVE
Glucose, UA: NEGATIVE mg/dL
Ketones, ur: NEGATIVE mg/dL
Leukocytes, UA: NEGATIVE
Nitrite: NEGATIVE
Protein, ur: NEGATIVE mg/dL
Specific Gravity, Urine: 1.015 (ref 1.005–1.030)
Urobilinogen, UA: 0.2 mg/dL (ref 0.0–1.0)
pH: 8.5 — ABNORMAL HIGH (ref 5.0–8.0)

## 2011-08-19 LAB — URINE MICROSCOPIC-ADD ON

## 2011-08-19 MED ORDER — RANITIDINE HCL 150 MG PO CAPS: 150.0000 mg | ORAL_CAPSULE | Freq: Two times a day (BID) | ORAL | Status: DC

## 2011-08-19 MED ORDER — GI COCKTAIL ~~LOC~~
30.0000 mL | Freq: Once | ORAL | Status: AC
  Administered 2011-08-19: 30 mL via ORAL
  Filled 2011-08-19: qty 30

## 2011-08-19 NOTE — ED Provider Notes (Signed)
History   Pt presents today c/o upper abd pain and LUQ pain today. She has also had N&V on and off for the past week. She denies lower abd pain, vag dc, bleeding, fever, or any other sx. She states she may just be having indigestion.  Chief Complaint  Patient presents with  . Emesis   HPI  OB History    Grav Para Term Preterm Abortions TAB SAB Ect Mult Living   4 2 2  0 1 0 0 1 0 2      Past Medical History  Diagnosis Date  . Vaginal yeast infection     recurrent  . UTI (urinary tract infection)     recurrent  . No pertinent past medical history     Past Surgical History  Procedure Date  . Cesarean section     No family history on file.  History  Substance Use Topics  . Smoking status: Never Smoker   . Smokeless tobacco: Not on file  . Alcohol Use: No    Allergies:  Allergies  Allergen Reactions  . Macrobid Nausea And Vomiting    Prescriptions prior to admission  Medication Sig Dispense Refill  . Prenatal Vit-Fe Sulfate-FA (PRENATAL VITAMIN PO) Take by mouth.          Review of Systems  Constitutional: Negative for fever.  Respiratory: Negative for cough, hemoptysis, sputum production, shortness of breath and wheezing.   Cardiovascular: Negative for chest pain, palpitations, orthopnea, claudication and leg swelling.  Gastrointestinal: Positive for nausea, vomiting and abdominal pain. Negative for diarrhea, constipation and blood in stool.  Genitourinary: Negative for dysuria, urgency, frequency and hematuria.  Neurological: Negative for dizziness and headaches.  Psychiatric/Behavioral: Negative for depression and suicidal ideas.   Physical Exam   Blood pressure 100/58, pulse 98, temperature 98.8 F (37.1 C), temperature source Oral, resp. rate 18, height 5\' 4"  (1.626 m), weight 254 lb 9.6 oz (115.486 kg).  Physical Exam  Constitutional: She is oriented to person, place, and time. She appears well-developed and well-nourished. No distress.  HENT:  Head:  Normocephalic and atraumatic.  Eyes: EOM are normal. Pupils are equal, round, and reactive to light.  GI: Soft. She exhibits no distension. There is Tenderness: epigastric tenderness to palpation.. There is no rebound and no guarding.  Neurological: She is alert and oriented to person, place, and time.  Skin: Skin is warm and dry. She is not diaphoretic.  Psychiatric: She has a normal mood and affect. Her behavior is normal. Judgment and thought content normal.    MAU Course  Procedures  Pt given GI cocktail.  NST shows no ctx or uterine irritability.   Assessment and Plan  Gerd/N&V: discussed with pt at length. Will give Rx for zantac and phenergan. Discussed diet, activity, risks, and precautions.  Clinton Gallant. Arbor Leer III, DrHSc, MPAS, PA-C  08/19/2011, 6:27 PM   Henrietta Hoover, PA 08/19/11 1918

## 2011-08-19 NOTE — Progress Notes (Signed)
Pt reports vomiting x 1 week. Vomiting worse today . Reports upper abd pain that radiated toward left side and back

## 2011-09-07 ENCOUNTER — Inpatient Hospital Stay (HOSPITAL_COMMUNITY)
Admission: AD | Admit: 2011-09-07 | Discharge: 2011-09-07 | Disposition: A | Discharge status: Home / Self Care | Payer: 59 | Source: Ambulatory Visit | Attending: Obstetrics and Gynecology | Admitting: Obstetrics and Gynecology

## 2011-09-07 ENCOUNTER — Encounter (HOSPITAL_COMMUNITY): Payer: Self-pay

## 2011-09-07 DIAGNOSIS — O368190 Decreased fetal movements, unspecified trimester, not applicable or unspecified: Secondary | ICD-10-CM

## 2011-09-07 LAB — URINALYSIS, ROUTINE W REFLEX MICROSCOPIC
Bilirubin Urine: NEGATIVE
Glucose, UA: NEGATIVE mg/dL
Ketones, ur: NEGATIVE mg/dL
Nitrite: NEGATIVE
Protein, ur: NEGATIVE mg/dL
Specific Gravity, Urine: 1.025 (ref 1.005–1.030)
Urobilinogen, UA: 0.2 mg/dL (ref 0.0–1.0)
pH: 5.5 (ref 5.0–8.0)

## 2011-09-07 LAB — URINE MICROSCOPIC-ADD ON

## 2011-09-07 NOTE — Progress Notes (Signed)
DENIES  HSV AND MRSA. HAS FELT BABY MOVE  LESS  THAN NORMAL TODAY.  PT HAS EATEN  AND DRANK -  CALLED OFFICE - TOLD TO COME IN.  HAS R LOWER  BACK PAIN- STARTED  ON WED -  DID NOT TELL OFFICE .   NEXT APPOINTMENT  09-18-2011

## 2011-09-07 NOTE — ED Provider Notes (Signed)
Julie Mcneil [id number] @[redacted]w[redacted]d   SUBJECTIVE  HPI:  She reports less fetal movement than usual all day today. She is felt to several sporadic movements but no sustained moving. Since she's been on the monitor she is feeling lots of fetal activity. She is also concerned with right low back to buttock pain that does not radiate down her leg. It's worse when she walks. She has urinary frequency but no dysuria or urgency.  Past Medical History  Diagnosis Date  . Vaginal yeast infection     recurrent  . UTI (urinary tract infection)     recurrent  . No pertinent past medical history     Past Surgical History  Procedure Date  . Cesarean section    History   Social History  . Marital Status: Married    Spouse Name: N/A    Number of Children: N/A  . Years of Education: N/A   Occupational History  . Not on file.   Social History Main Topics  . Smoking status: Never Smoker   . Smokeless tobacco: Not on file  . Alcohol Use: No  . Drug Use: No  . Sexually Active: Yes    Birth Control/ Protection: None   Other Topics Concern  . Not on file   Social History Narrative  . No narrative on file   No current facility-administered medications on file prior to encounter.   Current Outpatient Prescriptions on File Prior to Encounter  Medication Sig Dispense Refill  . prenatal vitamin w/FE, FA (PRENATAL 1 + 1) 27-1 MG TABS Take 1 tablet by mouth daily.        Allergies  Allergen Reactions  . Macrobid Nausea And Vomiting    ROS: Pertinent items in HPI  OBJECTIVE  BP 93/56  Pulse 100  Temp(Src) 98.7 F (37.1 C) (Oral)  Resp 20  Ht 5\' 2"  (1.575 m)  Wt 117.935 kg (260 lb)  BMI 47.55 kg/m2   FHR :135 baseline, reactive, mod variability Toco: No UCs  Physical Exam  Constitutional: She is well-developed, well-nourished, and in no distress.  HENT:  Head: Normocephalic.  Eyes: Pupils are equal, round, and reactive to light.  Neck: Normal range of motion. Neck supple.    Abdominal: Soft. There is no tenderness.  Musculoskeletal: Normal range of motion.       Neg CVAT. TTP in right sciatic region.     Results for orders placed during the hospital encounter of 09/07/11 (from the past 24 hour(s))  URINALYSIS, ROUTINE W REFLEX MICROSCOPIC     Status: Abnormal   Collection Time   09/07/11  7:33 PM      Component Value Range   Color, Urine YELLOW  YELLOW    Appearance CLEAR  CLEAR    Specific Gravity, Urine 1.025  1.005 - 1.030    pH 5.5  5.0 - 8.0    Glucose, UA NEGATIVE  NEGATIVE (mg/dL)   Hgb urine dipstick SMALL (*) NEGATIVE    Bilirubin Urine NEGATIVE  NEGATIVE    Ketones, ur NEGATIVE  NEGATIVE (mg/dL)   Protein, ur NEGATIVE  NEGATIVE (mg/dL)   Urobilinogen, UA 0.2  0.0 - 1.0 (mg/dL)   Nitrite NEGATIVE  NEGATIVE    Leukocytes, UA SMALL (*) NEGATIVE   URINE MICROSCOPIC-ADD ON     Status: Abnormal   Collection Time   09/07/11  7:33 PM      Component Value Range   Squamous Epithelial / LPF FEW (*) RARE    WBC, UA  3-6  <3 (WBC/hpf)   RBC / HPF 0-2  <3 (RBC/hpf)   Bacteria, UA MANY (*) RARE    ASSESSMENT G4P2 at [redacted]w[redacted]d Cat 1 FHR tracing and good maternal perception of FM Musculoskeletal back pain    PLAN Consulted Dr. Marcelle Overlie re equivocal UA. Advise push fluids and await urine C&S result

## 2011-10-14 ENCOUNTER — Encounter (HOSPITAL_COMMUNITY): Payer: Self-pay | Admitting: *Deleted

## 2011-10-14 ENCOUNTER — Inpatient Hospital Stay (HOSPITAL_COMMUNITY)
Admission: AD | Admit: 2011-10-14 | Discharge: 2011-10-14 | Disposition: A | Discharge status: Home / Self Care | Payer: 59 | Source: Ambulatory Visit | Attending: Obstetrics and Gynecology | Admitting: Obstetrics and Gynecology

## 2011-10-14 DIAGNOSIS — O4700 False labor before 37 completed weeks of gestation, unspecified trimester: Secondary | ICD-10-CM | POA: Insufficient documentation

## 2011-10-14 DIAGNOSIS — O479 False labor, unspecified: Secondary | ICD-10-CM

## 2011-10-14 LAB — URINALYSIS, ROUTINE W REFLEX MICROSCOPIC
Bilirubin Urine: NEGATIVE
Glucose, UA: NEGATIVE mg/dL
Ketones, ur: NEGATIVE mg/dL
Nitrite: NEGATIVE
Protein, ur: NEGATIVE mg/dL
Specific Gravity, Urine: >1.03 — ABNORMAL HIGH (ref 1.005–1.030)
Urobilinogen, UA: 0.2 mg/dL (ref 0.0–1.0)
pH: 6 (ref 5.0–8.0)

## 2011-10-14 LAB — URINE MICROSCOPIC-ADD ON

## 2011-10-14 NOTE — Progress Notes (Signed)
Written and verbal d/c instructions given and understanding voiced. Pt d/c with family

## 2011-10-14 NOTE — Progress Notes (Signed)
2045 T. Burleson NP in to see pt

## 2011-10-14 NOTE — ED Notes (Signed)
Easton Rice PA in room with pt  

## 2011-10-14 NOTE — Progress Notes (Signed)
Lilyan Punt NP aware of pt's uterine activity

## 2011-10-14 NOTE — Progress Notes (Signed)
Pt reports started having ctx 2 hrs ago. About 6 min aoart. Reports she generally just doesn't feel well. Pt reports good fetal movement. Denies vag bleeding or discharge.

## 2011-10-14 NOTE — Progress Notes (Signed)
Pitcher of water to pt 

## 2011-10-14 NOTE — ED Provider Notes (Signed)
History   Pt presents today c/o "not feeling well" and having some ctx since earlier today. She reports GFM and denies vag dc or bleeding. Her last episode of intercourse was last night.  Chief Complaint  Patient presents with  . Contractions   HPI  OB History    Grav Para Term Preterm Abortions TAB SAB Ect Mult Living   4 2 2  0 1 0 0 1 0 2      Past Medical History  Diagnosis Date  . Vaginal yeast infection     recurrent  . UTI (urinary tract infection)     recurrent  . No pertinent past medical history     Past Surgical History  Procedure Date  . Cesarean section     Family History  Problem Relation Age of Onset  . Anesthesia problems Neg Hx   . Hypotension Neg Hx   . Malignant hyperthermia Neg Hx   . Pseudochol deficiency Neg Hx     History  Substance Use Topics  . Smoking status: Never Smoker   . Smokeless tobacco: Never Used  . Alcohol Use: No    Allergies:  Allergies  Allergen Reactions  . Macrobid Hives and Nausea And Vomiting    Prescriptions prior to admission  Medication Sig Dispense Refill  . prenatal vitamin w/FE, FA (PRENATAL 1 + 1) 27-1 MG TABS Take 1 tablet by mouth daily.       . ranitidine (ZANTAC) 150 MG capsule Take 150 mg by mouth 2 (two) times daily.          Review of Systems  Constitutional: Negative for fever.  Eyes: Negative for blurred vision.  Cardiovascular: Negative for chest pain.  Gastrointestinal: Positive for abdominal pain. Negative for nausea, vomiting, diarrhea and constipation.  Genitourinary: Negative for dysuria, urgency, frequency and hematuria.  Neurological: Negative for dizziness and headaches.  Psychiatric/Behavioral: Negative for depression and suicidal ideas.   Physical Exam   Blood pressure 125/63, pulse 99, temperature 98.8 F (37.1 C), temperature source Oral, resp. rate 18, height 5\' 4"  (1.626 m), weight 263 lb (119.296 kg), unknown if currently breastfeeding.  Physical Exam  Nursing note and  vitals reviewed. Constitutional: She is oriented to person, place, and time. She appears well-developed and well-nourished. No distress.  HENT:  Head: Normocephalic and atraumatic.  Eyes: EOM are normal. Pupils are equal, round, and reactive to light.  GI: Soft. She exhibits no distension. There is no tenderness. There is no rebound and no guarding.  Genitourinary: No bleeding around the vagina. No vaginal discharge found.       Cervix Lg/closed.  Neurological: She is alert and oriented to person, place, and time.  Skin: Skin is warm and dry. She is not diaphoretic.  Psychiatric: She has a normal mood and affect. Her behavior is normal. Judgment and thought content normal.    MAU Course  Procedures Client given 2 large pitchers of water to drink.  After finishing, mild contractions have stopped. 2100 - Plan of care reviewed with Dr. Renaldo Fiddler and client is to go home. Results for orders placed during the hospital encounter of 10/14/11 (from the past 24 hour(s))  URINALYSIS, ROUTINE W REFLEX MICROSCOPIC     Status: Abnormal   Collection Time   10/14/11  6:00 PM      Component Value Range   Color, Urine YELLOW  YELLOW    APPearance HAZY (*) CLEAR    Specific Gravity, Urine >1.030 (*) 1.005 - 1.030    pH  6.0  5.0 - 8.0    Glucose, UA NEGATIVE  NEGATIVE (mg/dL)   Hgb urine dipstick TRACE (*) NEGATIVE    Bilirubin Urine NEGATIVE  NEGATIVE    Ketones, ur NEGATIVE  NEGATIVE (mg/dL)   Protein, ur NEGATIVE  NEGATIVE (mg/dL)   Urobilinogen, UA 0.2  0.0 - 1.0 (mg/dL)   Nitrite NEGATIVE  NEGATIVE    Leukocytes, UA SMALL (*) NEGATIVE   URINE MICROSCOPIC-ADD ON     Status: Abnormal   Collection Time   10/14/11  6:00 PM      Component Value Range   Squamous Epithelial / LPF MANY (*) RARE    WBC, UA 3-6  <3 (WBC/hpf)   RBC / HPF 0-2  <3 (RBC/hpf)   Bacteria, UA MANY (*) RARE      Assessment and Plan  Care of pt turned over to Nolene Bernheim, FNP.  Assessment False labor - cervix closed  and contractions have stopped.  Plan Will discharge Drink at least 8 8-oz glasses of water every day. Call your doctor if your symptoms worsen   Clinton Gallant. Rice III, DrHSc, MPAS, PA-C  10/14/2011, 7:56 PM   Henrietta Hoover, PA 10/14/11 2001  Nolene Bernheim, NP 10/14/11 2104

## 2011-10-23 ENCOUNTER — Inpatient Hospital Stay (HOSPITAL_COMMUNITY)
Admission: AD | Admit: 2011-10-23 | Discharge: 2011-10-23 | Disposition: A | Discharge status: Home / Self Care | Payer: 59 | Source: Ambulatory Visit | Attending: Obstetrics and Gynecology | Admitting: Obstetrics and Gynecology

## 2011-10-23 ENCOUNTER — Encounter (HOSPITAL_COMMUNITY): Payer: Self-pay | Admitting: *Deleted

## 2011-10-23 DIAGNOSIS — O368190 Decreased fetal movements, unspecified trimester, not applicable or unspecified: Secondary | ICD-10-CM | POA: Insufficient documentation

## 2011-10-23 LAB — URINALYSIS, ROUTINE W REFLEX MICROSCOPIC
Bilirubin Urine: NEGATIVE
Glucose, UA: NEGATIVE mg/dL
Ketones, ur: NEGATIVE mg/dL
Nitrite: NEGATIVE
Protein, ur: NEGATIVE mg/dL
Specific Gravity, Urine: 1.02 (ref 1.005–1.030)
Urobilinogen, UA: 0.2 mg/dL (ref 0.0–1.0)
pH: 6 (ref 5.0–8.0)

## 2011-10-23 LAB — URINE MICROSCOPIC-ADD ON

## 2011-10-23 NOTE — Progress Notes (Signed)
Pt states, " I still have felt movements today but not big movements today. She just feels different"

## 2011-10-29 ENCOUNTER — Encounter (HOSPITAL_COMMUNITY): Payer: Self-pay | Admitting: *Deleted

## 2011-10-29 ENCOUNTER — Emergency Department (INDEPENDENT_AMBULATORY_CARE_PROVIDER_SITE_OTHER)
Admission: EM | Admit: 2011-10-29 | Discharge: 2011-10-29 | Disposition: A | Discharge status: Home / Self Care | Payer: 59 | Source: Home / Self Care

## 2011-10-29 DIAGNOSIS — J019 Acute sinusitis, unspecified: Secondary | ICD-10-CM

## 2011-10-29 DIAGNOSIS — H6690 Otitis media, unspecified, unspecified ear: Secondary | ICD-10-CM

## 2011-10-29 MED ORDER — ACETAMINOPHEN 500 MG PO TABS
1000.0000 mg | ORAL_TABLET | Freq: Once | ORAL | Status: AC
  Administered 2011-10-29: 1000 mg via ORAL

## 2011-10-29 MED ORDER — ACETAMINOPHEN 325 MG PO TABS
ORAL_TABLET | ORAL | Status: AC
  Filled 2011-10-29: qty 3

## 2011-10-29 MED ORDER — AMOXICILLIN-POT CLAVULANATE 875-125 MG PO TABS: 1.0000 | ORAL_TABLET | Freq: Two times a day (BID) | ORAL | Status: DC

## 2011-10-29 NOTE — ED Notes (Signed)
Pt with onset of cough/congestion/fever x one week - onset of earache yesterday right ear increased pain today - pt taking azithromycin x 4 days - feeling worse - pt pregnant due 12/05/11

## 2011-10-29 NOTE — ED Provider Notes (Signed)
History     CSN: [id number]  Arrival date & time 10/29/11  2130   None     Chief Complaint  Patient presents with  . Otalgia  . Nasal Congestion  . Cough  . Fever    (Consider location/radiation/quality/duration/timing/severity/associated sxs/prior treatment) HPI Comments: Pt presents with c/o worsening nasal congestion, sinus pressure and cough. Onset of Rt ear pain yesterday. She was seen by her OB/GYN for her symptoms 3 days ago and prescribed ZPak but states she continues to worsen. Temp yesterday was 101, and this morning was 102.1.  She is taking Tylenol for fever and pain. Nasal mucus is yellow and green in color. Rt maxillary sinus pressure. Cough is sometimes productive with clear phlegm. She is pregnant.   Patient is a 30 y.o. female presenting with ear pain, cough, and fever.  Otalgia Associated symptoms include rhinorrhea and cough. Pertinent negatives include no sore throat.  Cough Associated symptoms include ear pain and rhinorrhea. Pertinent negatives include no chest pain, no chills, no sore throat, no shortness of breath and no wheezing.  Fever Primary symptoms of the febrile illness include fever and cough. Primary symptoms do not include wheezing or shortness of breath.    Past Medical History  Diagnosis Date  . Vaginal yeast infection     recurrent  . UTI (urinary tract infection)     recurrent  . No pertinent past medical history   . Pregnant state, incidental     Past Surgical History  Procedure Date  . Cesarean section     Family History  Problem Relation Age of Onset  . Anesthesia problems Neg Hx   . Hypotension Neg Hx   . Malignant hyperthermia Neg Hx   . Pseudochol deficiency Neg Hx   . Early death Mother   . Heart disease Maternal Grandmother   . Diabetes Maternal Grandmother   . Cancer Maternal Grandmother   . Hypertension Paternal Grandmother   . Hypertension Paternal Grandfather     History  Substance Use Topics  . Smoking  status: Never Smoker   . Smokeless tobacco: Never Used  . Alcohol Use: No    OB History    Grav Para Term Preterm Abortions TAB SAB Ect Mult Living   4 2 2  0 1 0 0 1 0 2      Review of Systems  Constitutional: Positive for fever. Negative for chills.  HENT: Positive for ear pain, congestion, rhinorrhea, postnasal drip and sinus pressure. Negative for sore throat.   Respiratory: Positive for cough. Negative for shortness of breath and wheezing.   Cardiovascular: Negative for chest pain.    Allergies  Macrobid  Home Medications   Current Outpatient Rx  Name Route Sig Dispense Refill  . AMOXICILLIN-POT CLAVULANATE 875-125 MG PO TABS Oral Take 1 tablet by mouth 2 (two) times daily with a meal. 20 tablet 0  . PRENATAL PLUS 27-1 MG PO TABS Oral Take 1 tablet by mouth daily.     Marland Kitchen RANITIDINE HCL 150 MG PO CAPS Oral Take 150 mg by mouth 2 (two) times daily.        BP 125/53  Pulse 103  Temp(Src) 98.3 F (36.8 C) (Oral)  Resp 18  SpO2 100%  Breastfeeding? Unknown  Physical Exam  Nursing note and vitals reviewed. Constitutional: She appears well-developed and well-nourished. No distress.  HENT:  Head: Normocephalic and atraumatic.  Right Ear: External ear and ear canal normal. Tympanic membrane is erythematous.  Left Ear: Tympanic membrane, external  ear and ear canal normal.  Nose: Mucosal edema and rhinorrhea present.  Mouth/Throat: Uvula is midline, oropharynx is clear and moist and mucous membranes are normal. No oropharyngeal exudate, posterior oropharyngeal edema or posterior oropharyngeal erythema.  Neck: Neck supple.  Cardiovascular: Normal rate, regular rhythm and normal heart sounds.   Pulmonary/Chest: Effort normal and breath sounds normal. No respiratory distress.  Lymphadenopathy:    She has no cervical adenopathy.  Neurological: She is alert.  Skin: Skin is warm and dry.  Psychiatric: She has a normal mood and affect.    ED Course  Procedures (including  critical care time)  Labs Reviewed - No data to display No results found.   1. Otitis media, acute   2. Acute sinusitis       MDM   Pregnant pt worsening sinusitis symptoms with Zithromax treatment and onset of ROM.        Melody Comas, [place] 10/29/11 1209

## 2011-10-30 NOTE — ED Provider Notes (Signed)
Medical screening examination/treatment/procedure(s) were performed by non-physician practitioner and as supervising physician I was immediately available for consultation/collaboration.   KINDL,JAMES DOUGLAS MD.    James Douglas Kindl, MD 10/30/11 1744 

## 2011-11-07 ENCOUNTER — Encounter (HOSPITAL_COMMUNITY): Payer: Self-pay | Admitting: *Deleted

## 2011-11-07 ENCOUNTER — Inpatient Hospital Stay (HOSPITAL_COMMUNITY): Payer: 59

## 2011-11-07 ENCOUNTER — Inpatient Hospital Stay (HOSPITAL_COMMUNITY)
Admission: AD | Admit: 2011-11-07 | Discharge: 2011-11-07 | Disposition: A | Discharge status: Home / Self Care | Payer: 59 | Source: Ambulatory Visit | Attending: Obstetrics and Gynecology | Admitting: Obstetrics and Gynecology

## 2011-11-07 DIAGNOSIS — O479 False labor, unspecified: Secondary | ICD-10-CM

## 2011-11-07 DIAGNOSIS — O4700 False labor before 37 completed weeks of gestation, unspecified trimester: Secondary | ICD-10-CM | POA: Insufficient documentation

## 2011-11-07 NOTE — ED Provider Notes (Signed)
History   Pt presents today c/o ctx "on and off" all day. She states they have become more regular over the past 2 hours and she became concerned. She reports GFM and denies vag dc or bleeding. She is scheduled for repeat C/Section on 11/28/11.  Chief Complaint  Patient presents with  . Contractions   HPI  OB History    Grav Para Term Preterm Abortions TAB SAB Ect Mult Living   4 2 2  0 1 0 0 1 0 2      Past Medical History  Diagnosis Date  . Vaginal yeast infection     recurrent  . UTI (urinary tract infection)     recurrent  . No pertinent past medical history   . Pregnant state, incidental     Past Surgical History  Procedure Date  . Cesarean section     Family History  Problem Relation Age of Onset  . Anesthesia problems Neg Hx   . Hypotension Neg Hx   . Malignant hyperthermia Neg Hx   . Pseudochol deficiency Neg Hx   . Early death Mother   . Heart disease Maternal Grandmother   . Diabetes Maternal Grandmother   . Cancer Maternal Grandmother   . Hypertension Paternal Grandmother   . Hypertension Paternal Grandfather     History  Substance Use Topics  . Smoking status: Never Smoker   . Smokeless tobacco: Never Used  . Alcohol Use: No    Allergies:  Allergies  Allergen Reactions  . Macrobid Hives and Nausea And Vomiting    Prescriptions prior to admission  Medication Sig Dispense Refill  . amoxicillin-clavulanate (AUGMENTIN) 875-125 MG per tablet Take 1 tablet by mouth 2 (two) times daily with a meal. Completed course 11/07/11       . prenatal vitamin w/FE, FA (PRENATAL 1 + 1) 27-1 MG TABS Take 1 tablet by mouth daily.       . ranitidine (ZANTAC) 150 MG capsule Take 150 mg by mouth 2 (two) times daily.        Marland Kitchen DISCONTD: amoxicillin-clavulanate (AUGMENTIN) 875-125 MG per tablet Take 1 tablet by mouth 2 (two) times daily with a meal.  20 tablet  0    Review of Systems  Constitutional: Negative for fever.  Eyes: Negative for blurred vision.    Cardiovascular: Negative for chest pain and palpitations.  Gastrointestinal: Positive for abdominal pain. Negative for nausea, vomiting, diarrhea and constipation.  Genitourinary: Negative for dysuria, urgency, frequency and hematuria.  Neurological: Negative for dizziness and headaches.  Psychiatric/Behavioral: Negative for depression and suicidal ideas.   Physical Exam   Blood pressure 116/56, pulse 105, temperature 97.1 F (36.2 C), temperature source Oral, resp. rate 18, height 5\' 4"  (1.626 m), weight 271 lb (122.925 kg).  Physical Exam  Nursing note and vitals reviewed. Constitutional: She is oriented to person, place, and time. She appears well-developed and well-nourished. No distress.  HENT:  Head: Normocephalic and atraumatic.  Eyes: EOM are normal. Pupils are equal, round, and reactive to light.  GI: Soft. She exhibits no distension. There is no tenderness. There is no rebound and no guarding.  Genitourinary: No bleeding around the vagina. No vaginal discharge found.       Cervix Lg/closed.  Neurological: She is alert and oriented to person, place, and time.  Skin: Skin is warm and dry. She is not diaphoretic.  Psychiatric: She has a normal mood and affect. Her behavior is normal. Judgment and thought content normal.    MAU  Course  Procedures  BPP 8/8.  Discussed pt with Dr. Henderson Cloud. Ok to dc to home.  Assessment and Plan  Braxton Hicks: discussed with pt at length. She has f/u scheduled. Reminded of [facility]. Discussed signs and sx of labor. Discussed diet, activity, risks, and precautions.  Clinton Gallant. Zarah Carbon III, DrHSc, MPAS, PA-C  11/07/2011, 8:59 PM   Henrietta Hoover, PA 11/07/11 2250

## 2011-11-07 NOTE — Progress Notes (Signed)
Pt presents to mau for contractions that have been on and off all day but became regular over the last 2 hours.

## 2011-11-07 NOTE — Progress Notes (Signed)
Jenean Lindau, PA at bedside.  Assessment done and poc discussed with pt.  VE done.

## 2011-11-17 ENCOUNTER — Encounter (HOSPITAL_COMMUNITY): Payer: Self-pay | Admitting: Pharmacist

## 2011-11-22 ENCOUNTER — Encounter (HOSPITAL_COMMUNITY): Payer: Self-pay | Admitting: *Deleted

## 2011-11-22 NOTE — H&P (Signed)
  Patient name  Julie Mcneil DICTATION#   [id number] CSN# [id number]  Juluis Mire, MD 11/22/2011 8:04 AM

## 2011-11-22 NOTE — H&P (Signed)
NAME:  Julie Mcneil, Julie Mcneil                   ACCOUNT NO.:  MEDICAL RECORD NO.:  [id number]  LOCATION:                                 FACILITY:  PHYSICIAN:  Juluis Mire, M.D.   DATE OF BIRTH:  1981-06-17  DATE OF ADMISSION: DATE OF DISCHARGE:                             HISTORY & PHYSICAL   The patient is a 31 year old gravida 4, para 2, abortus 1 female, last menstrual period of April 24, given estimated date of confinement of January 29.  This gives her estimated gestational age of [redacted] weeks consistent with prior ultrasound.  The patient has had 2 prior cesarean section.  We are going to proceed with a repeat cesarean section.  She is desirous of permanent sterilization.  Alternatives for birth control have been discussed.  The potential irreversibility of sterilization is explained.  Prenatal course has otherwise been uncomplicated.  ALLERGIES:  She is allergic to [facility].  MEDICATION:  Prenatal vitamins.  PAST MEDICAL HISTORY/FAMILY HISTORY/SOCIAL HISTORY:  Please see prenatal records.  REVIEW OF SYSTEMS:  Noncontributory.  PHYSICAL EXAMINATION:  VITAL SIGNS:  The patient is afebrile, stable vital signs. HEENT:  The patient is normocephalic.  Pupils equal, round, react to light and accommodation.  Extraocular movements were intact.  Sclerae and conjunctivae are clear.  Oropharynx clear. NECK:  Not examined. BREASTS:  No discrete masses. LUNGS:  Clear. CARDIOVASCULAR:  Regular rhythm and rate without murmurs or gallops. ABDOMEN:  Gravid uterus consistent with dates. PELVIC:  Deferred. EXTREMITIES:  Trace edema. NEUROLOGIC:  Grossly within normal limits.  IMPRESSION: 1. Intrauterine pregnancy at 39 weeks with 2 prior cesarean section     for repeat. 2. Multiparity, desires sterility.  PLAN:  The patient to undergo repeat cesarean section with bilateral tubal ligation.  The risks have been discussed including the risk of infection.  The risk of hemorrhage that  could require transfusion with the risk of AIDS or hepatitis.  Excessive bleeding could require hysterectomy.  There is a risk of injury to adjacent organs such as bowel, bladder, or ureters that could require further exploratory surgery.  Risk of deep venous thrombosis and pulmonary embolus.  In terms of sterilization, alternative forms of birth control have been discussed.  Potential irreversibility of sterilization is explained.  Failure rate of 1 in 200 is quoted.  Failures can be in the form of ectopic pregnancy requiring further surgical management.  The patient does understand the indications, risks, and alternatives.     Juluis Mire, M.D.     JSM/MEDQ  D:  11/22/2011  T:  11/22/2011  Job:  [id number]

## 2011-11-23 ENCOUNTER — Encounter (HOSPITAL_COMMUNITY): Payer: Self-pay

## 2011-11-23 ENCOUNTER — Encounter (HOSPITAL_COMMUNITY)
Admission: RE | Admit: 2011-11-23 | Discharge: 2011-11-23 | Disposition: A | Discharge status: Home / Self Care | Payer: 59 | Source: Ambulatory Visit | Attending: Obstetrics and Gynecology | Admitting: Obstetrics and Gynecology

## 2011-11-23 HISTORY — DX: Anemia, unspecified: D64.9

## 2011-11-23 HISTORY — DX: Gastro-esophageal reflux disease without esophagitis: K21.9

## 2011-11-23 LAB — CBC
HCT: 32.2 % — ABNORMAL LOW (ref 36.0–46.0)
Hemoglobin: 10.3 g/dL — ABNORMAL LOW (ref 12.0–15.0)
MCH: 28.5 pg (ref 26.0–34.0)
MCHC: 32 g/dL (ref 30.0–36.0)
MCV: 89.2 fL (ref 78.0–100.0)
Platelets: 222 10*3/uL (ref 150–400)
RBC: 3.61 MIL/uL — ABNORMAL LOW (ref 3.87–5.11)
RDW: 16 % — ABNORMAL HIGH (ref 11.5–15.5)
WBC: 9.2 10*3/uL (ref 4.0–10.5)

## 2011-11-23 LAB — SURGICAL PCR SCREEN
MRSA, PCR: NEGATIVE
Staphylococcus aureus: NEGATIVE

## 2011-11-23 LAB — RPR: RPR Ser Ql: NONREACTIVE

## 2011-11-23 NOTE — Patient Instructions (Addendum)
   Your procedure is scheduled NF:[id number] January 22nd Enter through the Main Entrance of [facility] at: 6am Pick up the phone at the desk and dial [phone] and inform us of your arrival.  Please call this number if you have any problems the morning of surgery: [phone]  Remember: Do not eat food after midnight: Monday Do not drink clear liquids after:midnight Monday Take these medicines the morning of surgery with a SIP OF WATER: Zantac  Do not wear jewelry, make-up, or FINGER nail polish Do not wear lotions, powders, perfumes or deodorant. Do not shave 48 hours prior to surgery. Do not bring valuables to the hospital.  Leave suitcase in the car. After Surgery it may be brought to your room. For patients being admitted to the hospital, checkout time is 11:00am the day of discharge.     Remember to use your hibiclens as instructed.Please shower with 1/2 bottle the evening before your surgery and the other 1/2 bottle the morning of surgery.

## 2011-11-28 ENCOUNTER — Encounter (HOSPITAL_COMMUNITY): Payer: Self-pay | Admitting: Cardiology

## 2011-11-28 ENCOUNTER — Encounter (HOSPITAL_COMMUNITY)
Admission: RE | Disposition: A | Discharge status: Home / Self Care | Payer: Self-pay | Source: Ambulatory Visit | Attending: Obstetrics and Gynecology

## 2011-11-28 ENCOUNTER — Inpatient Hospital Stay (HOSPITAL_COMMUNITY): Payer: 59 | Admitting: Anesthesiology

## 2011-11-28 ENCOUNTER — Encounter (HOSPITAL_COMMUNITY): Payer: Self-pay | Admitting: *Deleted

## 2011-11-28 ENCOUNTER — Encounter (HOSPITAL_COMMUNITY): Payer: Self-pay | Admitting: Anesthesiology

## 2011-11-28 ENCOUNTER — Inpatient Hospital Stay (HOSPITAL_COMMUNITY)
Admission: RE | Admit: 2011-11-28 | Discharge: 2011-12-01 | DRG: 766 | Disposition: A | Discharge status: Home / Self Care | Payer: 59 | Source: Ambulatory Visit | Attending: Obstetrics and Gynecology | Admitting: Obstetrics and Gynecology

## 2011-11-28 ENCOUNTER — Other Ambulatory Visit: Payer: Self-pay | Admitting: Obstetrics and Gynecology

## 2011-11-28 DIAGNOSIS — Z01818 Encounter for other preprocedural examination: Secondary | ICD-10-CM

## 2011-11-28 DIAGNOSIS — O34219 Maternal care for unspecified type scar from previous cesarean delivery: Principal | ICD-10-CM | POA: Diagnosis present

## 2011-11-28 DIAGNOSIS — Z302 Encounter for sterilization: Secondary | ICD-10-CM

## 2011-11-28 DIAGNOSIS — Z01812 Encounter for preprocedural laboratory examination: Secondary | ICD-10-CM

## 2011-11-28 LAB — TYPE AND SCREEN
ABO/RH(D): A POS
Antibody Screen: NEGATIVE

## 2011-11-28 SURGERY — Surgical Case
Anesthesia: Spinal | Laterality: Bilateral

## 2011-11-28 MED ORDER — SENNOSIDES-DOCUSATE SODIUM 8.6-50 MG PO TABS
2.0000 | ORAL_TABLET | Freq: Every day | ORAL | Status: DC
  Administered 2011-11-28 – 2011-11-30 (×3): 2 via ORAL

## 2011-11-28 MED ORDER — ONDANSETRON HCL 4 MG/2ML IJ SOLN: 4.0000 mg | Freq: Three times a day (TID) | INTRAMUSCULAR | Status: DC | PRN

## 2011-11-28 MED ORDER — IBUPROFEN 600 MG PO TABS
600.0000 mg | ORAL_TABLET | Freq: Four times a day (QID) | ORAL | Status: DC
  Administered 2011-11-28 – 2011-12-01 (×10): 600 mg via ORAL
  Filled 2011-11-28 (×2): qty 1

## 2011-11-28 MED ORDER — KETOROLAC TROMETHAMINE 30 MG/ML IJ SOLN
30.0000 mg | Freq: Four times a day (QID) | INTRAMUSCULAR | Status: AC | PRN
  Administered 2011-11-28: 30 mg via INTRAMUSCULAR

## 2011-11-28 MED ORDER — ONDANSETRON HCL 4 MG PO TABS: 4.0000 mg | ORAL_TABLET | ORAL | Status: DC | PRN

## 2011-11-28 MED ORDER — SODIUM CHLORIDE 0.9 % IV SOLN: 1.0000 ug/kg/h | INTRAVENOUS | Status: DC | PRN

## 2011-11-28 MED ORDER — PHENYLEPHRINE HCL 10 MG/ML IJ SOLN
INTRAMUSCULAR | Status: DC | PRN
  Administered 2011-11-28: 120 ug via INTRAVENOUS
  Administered 2011-11-28 (×8): 40 ug via INTRAVENOUS

## 2011-11-28 MED ORDER — KETOROLAC TROMETHAMINE 30 MG/ML IJ SOLN
30.0000 mg | Freq: Four times a day (QID) | INTRAMUSCULAR | Status: AC | PRN
  Filled 2011-11-28: qty 1

## 2011-11-28 MED ORDER — FENTANYL CITRATE 0.05 MG/ML IJ SOLN
INTRAMUSCULAR | Status: AC
  Filled 2011-11-28: qty 2

## 2011-11-28 MED ORDER — OXYTOCIN 20 UNITS IN LACTATED RINGERS INFUSION - SIMPLE
INTRAVENOUS | Status: AC
  Administered 2011-11-28: 125 mL/h via INTRAVENOUS
  Filled 2011-11-28: qty 1000

## 2011-11-28 MED ORDER — BISACODYL 10 MG RE SUPP: 10.0000 mg | Freq: Every day | RECTAL | Status: DC | PRN

## 2011-11-28 MED ORDER — FENTANYL CITRATE 0.05 MG/ML IJ SOLN: 25.0000 ug | INTRAMUSCULAR | Status: DC | PRN

## 2011-11-28 MED ORDER — MORPHINE SULFATE (PF) 0.5 MG/ML IJ SOLN
INTRAMUSCULAR | Status: DC | PRN
  Administered 2011-11-28: .1 mg via INTRATHECAL

## 2011-11-28 MED ORDER — SIMETHICONE 80 MG PO CHEW
80.0000 mg | CHEWABLE_TABLET | Freq: Three times a day (TID) | ORAL | Status: DC
  Administered 2011-11-28 – 2011-11-30 (×11): 80 mg via ORAL

## 2011-11-28 MED ORDER — NALBUPHINE HCL 10 MG/ML IJ SOLN: 5.0000 mg | INTRAMUSCULAR | Status: DC | PRN

## 2011-11-28 MED ORDER — DIBUCAINE 1 % RE OINT: 1.0000 "application " | TOPICAL_OINTMENT | RECTAL | Status: DC | PRN

## 2011-11-28 MED ORDER — DIPHENHYDRAMINE HCL 50 MG/ML IJ SOLN: 25.0000 mg | INTRAMUSCULAR | Status: DC | PRN

## 2011-11-28 MED ORDER — SODIUM CHLORIDE 0.9 % IV SOLN: 250.0000 mL | INTRAVENOUS | Status: DC | PRN

## 2011-11-28 MED ORDER — KETOROLAC TROMETHAMINE 30 MG/ML IJ SOLN
INTRAMUSCULAR | Status: AC
  Administered 2011-11-28: 30 mg via INTRAMUSCULAR
  Filled 2011-11-28: qty 1

## 2011-11-28 MED ORDER — METOCLOPRAMIDE HCL 5 MG/ML IJ SOLN: 10.0000 mg | Freq: Three times a day (TID) | INTRAMUSCULAR | Status: DC | PRN

## 2011-11-28 MED ORDER — SODIUM CHLORIDE 0.9 % IJ SOLN: 3.0000 mL | INTRAMUSCULAR | Status: DC | PRN

## 2011-11-28 MED ORDER — WITCH HAZEL-GLYCERIN EX PADS: 1.0000 "application " | MEDICATED_PAD | CUTANEOUS | Status: DC | PRN

## 2011-11-28 MED ORDER — OXYCODONE-ACETAMINOPHEN 5-325 MG PO TABS
1.0000 | ORAL_TABLET | ORAL | Status: DC | PRN
  Administered 2011-11-28: 1 via ORAL
  Administered 2011-11-29: 2 via ORAL
  Administered 2011-11-29 – 2011-11-30 (×4): 1 via ORAL
  Administered 2011-11-30 – 2011-12-01 (×5): 2 via ORAL
  Filled 2011-11-28 (×3): qty 1
  Filled 2011-11-28: qty 2
  Filled 2011-11-28: qty 1
  Filled 2011-11-28 (×6): qty 2

## 2011-11-28 MED ORDER — TETANUS-DIPHTH-ACELL PERTUSSIS 5-2.5-18.5 LF-MCG/0.5 IM SUSP: 0.5000 mL | Freq: Once | INTRAMUSCULAR | Status: DC

## 2011-11-28 MED ORDER — OXYTOCIN 20 UNITS IN LACTATED RINGERS INFUSION - SIMPLE
INTRAVENOUS | Status: DC | PRN
  Administered 2011-11-28: 20 [IU] via INTRAVENOUS

## 2011-11-28 MED ORDER — PHENYLEPHRINE 40 MCG/ML (10ML) SYRINGE FOR IV PUSH (FOR BLOOD PRESSURE SUPPORT)
PREFILLED_SYRINGE | INTRAVENOUS | Status: AC
  Filled 2011-11-28: qty 15

## 2011-11-28 MED ORDER — ACETAMINOPHEN 10 MG/ML IV SOLN: 1000.0000 mg | Freq: Four times a day (QID) | INTRAVENOUS | Status: AC | PRN

## 2011-11-28 MED ORDER — ZOLPIDEM TARTRATE 5 MG PO TABS: 5.0000 mg | ORAL_TABLET | Freq: Every evening | ORAL | Status: DC | PRN

## 2011-11-28 MED ORDER — OXYTOCIN 20 UNITS IN LACTATED RINGERS INFUSION - SIMPLE
125.0000 mL/h | INTRAVENOUS | Status: AC
  Administered 2011-11-28 (×2): 125 mL/h via INTRAVENOUS

## 2011-11-28 MED ORDER — BUPIVACAINE IN DEXTROSE 0.75-8.25 % IT SOLN
INTRATHECAL | Status: DC | PRN
  Administered 2011-11-28: 12 mg via INTRATHECAL

## 2011-11-28 MED ORDER — LACTATED RINGERS IV SOLN
INTRAVENOUS | Status: DC
  Administered 2011-11-28 (×3): via INTRAVENOUS

## 2011-11-28 MED ORDER — ONDANSETRON HCL 4 MG/2ML IJ SOLN
INTRAMUSCULAR | Status: AC
  Filled 2011-11-28: qty 2

## 2011-11-28 MED ORDER — FENTANYL CITRATE 0.05 MG/ML IJ SOLN
INTRAMUSCULAR | Status: DC | PRN
  Administered 2011-11-28: 25 ug via INTRATHECAL

## 2011-11-28 MED ORDER — LACTATED RINGERS IV SOLN
INTRAVENOUS | Status: DC
  Administered 2011-11-28: 18:00:00 via INTRAVENOUS

## 2011-11-28 MED ORDER — PRENATAL MULTIVITAMIN CH
1.0000 | ORAL_TABLET | Freq: Every day | ORAL | Status: DC
  Administered 2011-11-29: 1 via ORAL
  Filled 2011-11-28: qty 1

## 2011-11-28 MED ORDER — ACETAMINOPHEN 325 MG PO TABS: 325.0000 mg | ORAL_TABLET | ORAL | Status: DC | PRN

## 2011-11-28 MED ORDER — NALOXONE HCL 0.4 MG/ML IJ SOLN: 0.4000 mg | INTRAMUSCULAR | Status: DC | PRN

## 2011-11-28 MED ORDER — FLEET ENEMA 7-19 GM/118ML RE ENEM: 1.0000 | ENEMA | Freq: Every day | RECTAL | Status: DC | PRN

## 2011-11-28 MED ORDER — CEFAZOLIN SODIUM 1-5 GM-% IV SOLN
1.0000 g | INTRAVENOUS | Status: AC
  Administered 2011-11-28: 1 g via INTRAVENOUS

## 2011-11-28 MED ORDER — ONDANSETRON HCL 4 MG/2ML IJ SOLN
INTRAMUSCULAR | Status: DC | PRN
  Administered 2011-11-28: 4 mg via INTRAVENOUS

## 2011-11-28 MED ORDER — MORPHINE SULFATE 0.5 MG/ML IJ SOLN
INTRAMUSCULAR | Status: AC
  Filled 2011-11-28: qty 10

## 2011-11-28 MED ORDER — NALBUPHINE HCL 10 MG/ML IJ SOLN
5.0000 mg | INTRAMUSCULAR | Status: DC | PRN
Start: 2011-11-28 — End: 2011-11-29

## 2011-11-28 MED ORDER — MEPERIDINE HCL 25 MG/ML IJ SOLN: 6.2500 mg | INTRAMUSCULAR | Status: DC | PRN

## 2011-11-28 MED ORDER — PHENYLEPHRINE 40 MCG/ML (10ML) SYRINGE FOR IV PUSH (FOR BLOOD PRESSURE SUPPORT)
PREFILLED_SYRINGE | INTRAVENOUS | Status: AC
  Filled 2011-11-28: qty 5

## 2011-11-28 MED ORDER — SCOPOLAMINE 1 MG/3DAYS TD PT72
1.0000 | MEDICATED_PATCH | Freq: Once | TRANSDERMAL | Status: DC
  Administered 2011-11-28: 1.5 mg via TRANSDERMAL

## 2011-11-28 MED ORDER — PROMETHAZINE HCL 25 MG/ML IJ SOLN: 6.2500 mg | INTRAMUSCULAR | Status: DC | PRN

## 2011-11-28 MED ORDER — SIMETHICONE 80 MG PO CHEW: 80.0000 mg | CHEWABLE_TABLET | ORAL | Status: DC | PRN

## 2011-11-28 MED ORDER — MEPERIDINE HCL 25 MG/ML IJ SOLN
INTRAMUSCULAR | Status: DC | PRN
  Administered 2011-11-28 (×2): 12.5 mg via INTRAVENOUS

## 2011-11-28 MED ORDER — ONDANSETRON HCL 4 MG/2ML IJ SOLN: 4.0000 mg | INTRAMUSCULAR | Status: DC | PRN

## 2011-11-28 MED ORDER — EPHEDRINE 5 MG/ML INJ
INTRAVENOUS | Status: AC
  Filled 2011-11-28: qty 10

## 2011-11-28 MED ORDER — DIPHENHYDRAMINE HCL 25 MG PO CAPS: 25.0000 mg | ORAL_CAPSULE | Freq: Four times a day (QID) | ORAL | Status: DC | PRN

## 2011-11-28 MED ORDER — SODIUM CHLORIDE 0.9 % IJ SOLN: 3.0000 mL | Freq: Two times a day (BID) | INTRAMUSCULAR | Status: DC

## 2011-11-28 MED ORDER — OXYTOCIN 10 UNIT/ML IJ SOLN
INTRAMUSCULAR | Status: AC
  Filled 2011-11-28: qty 4

## 2011-11-28 MED ORDER — MEPERIDINE HCL 25 MG/ML IJ SOLN
INTRAMUSCULAR | Status: AC
  Filled 2011-11-28: qty 1

## 2011-11-28 MED ORDER — DIPHENHYDRAMINE HCL 25 MG PO CAPS
25.0000 mg | ORAL_CAPSULE | ORAL | Status: DC | PRN
  Filled 2011-11-28: qty 1

## 2011-11-28 MED ORDER — SCOPOLAMINE 1 MG/3DAYS TD PT72: 1.0000 | MEDICATED_PATCH | Freq: Once | TRANSDERMAL | Status: DC

## 2011-11-28 MED ORDER — IBUPROFEN 600 MG PO TABS
600.0000 mg | ORAL_TABLET | Freq: Four times a day (QID) | ORAL | Status: DC | PRN
  Administered 2011-11-30: 600 mg via ORAL
  Filled 2011-11-28 (×9): qty 1

## 2011-11-28 MED ORDER — MENTHOL 3 MG MT LOZG: 1.0000 | LOZENGE | OROMUCOSAL | Status: DC | PRN

## 2011-11-28 MED ORDER — LANOLIN HYDROUS EX OINT: 1.0000 "application " | TOPICAL_OINTMENT | CUTANEOUS | Status: DC | PRN

## 2011-11-28 MED ORDER — DIPHENHYDRAMINE HCL 50 MG/ML IJ SOLN: 12.5000 mg | INTRAMUSCULAR | Status: DC | PRN

## 2011-11-28 SURGICAL SUPPLY — 33 items
ADH SKN CLS APL DERMABOND .7 (GAUZE/BANDAGES/DRESSINGS) ×1
CLOTH BEACON ORANGE TIMEOUT ST (SAFETY) ×2 IMPLANT
DERMABOND ADVANCED (GAUZE/BANDAGES/DRESSINGS) ×1
DERMABOND ADVANCED .7 DNX12 (GAUZE/BANDAGES/DRESSINGS) IMPLANT
DRESSING TELFA 8X3 (GAUZE/BANDAGES/DRESSINGS) IMPLANT
ELECT REM PT RETURN 9FT ADLT (ELECTROSURGICAL) ×2
ELECTRODE REM PT RTRN 9FT ADLT (ELECTROSURGICAL) ×1 IMPLANT
EXTRACTOR VACUUM M CUP 4 TUBE (SUCTIONS) IMPLANT
GAUZE SPONGE 4X4 12PLY STRL LF (GAUZE/BANDAGES/DRESSINGS) ×4 IMPLANT
GLOVE BIO SURGEON STRL SZ7 (GLOVE) ×4 IMPLANT
GOWN PREVENTION PLUS LG XLONG (DISPOSABLE) ×4 IMPLANT
KIT ABG SYR 3ML LUER SLIP (SYRINGE) ×2 IMPLANT
NDL HYPO 25X5/8 SAFETYGLIDE (NEEDLE) ×1 IMPLANT
NEEDLE HYPO 25X5/8 SAFETYGLIDE (NEEDLE) ×2 IMPLANT
NS IRRIG 1000ML POUR BTL (IV SOLUTION) ×2 IMPLANT
PACK C SECTION WH (CUSTOM PROCEDURE TRAY) ×2 IMPLANT
PAD ABD 7.5X8 STRL (GAUZE/BANDAGES/DRESSINGS) IMPLANT
SLEEVE SCD COMPRESS KNEE MED (MISCELLANEOUS) IMPLANT
STAPLER VISISTAT 35W (STAPLE) IMPLANT
STRIP CLOSURE SKIN 1/4X4 (GAUZE/BANDAGES/DRESSINGS) ×2 IMPLANT
SUT CHROMIC 0 CT 1 (SUTURE) IMPLANT
SUT CHROMIC 0 CTX 36 (SUTURE) ×4 IMPLANT
SUT CHROMIC 2 0 SH (SUTURE) IMPLANT
SUT MNCRL AB 3-0 PS2 18 (SUTURE) ×1 IMPLANT
SUT MON AB-0 CT1 36 (SUTURE) ×1 IMPLANT
SUT PDS AB 0 CT 36 (SUTURE) ×2 IMPLANT
SUT PLAIN 0 NONE (SUTURE) IMPLANT
SUT PLAIN 2 0 XLH (SUTURE) IMPLANT
SUT VIC AB 3-0 CT1 27 (SUTURE) ×2
SUT VIC AB 3-0 CT1 TAPERPNT 27 (SUTURE) ×1 IMPLANT
TOWEL OR 17X24 6PK STRL BLUE (TOWEL DISPOSABLE) ×4 IMPLANT
TRAY FOLEY CATH 14FR (SET/KITS/TRAYS/PACK) ×2 IMPLANT
WATER STERILE IRR 1000ML POUR (IV SOLUTION) ×2 IMPLANT

## 2011-11-28 NOTE — Op Note (Signed)
NAME:  TOIA, MICALE NO.:  [id number]  MEDICAL RECORD NO.:  [id number]  LOCATION:  [facility]                          FACILITY:  [facility]  PHYSICIAN:  Juluis Mire, M.D.   DATE OF BIRTH:  1981/06/22  DATE OF PROCEDURE:  11/28/2011 DATE OF DISCHARGE:                              OPERATIVE REPORT   PREOPERATIVE DIAGNOSES: 1. Intrauterine pregnancy at 39 weeks, prior cesarean section, for     repeat. 2. Multiparity, desires sterility.  POSTOPERATIVE DIAGNOSES: 1. Intrauterine pregnancy at 39 weeks, prior cesarean section, for     repeat. 2. Multiparity, desires sterility.  OPERATIVE PROCEDURE:  Repeat low transverse cesarean section with bilateral tubal ligation.  SURGEON:  Juluis Mire, MD.  ANESTHESIA:  Spinal.  ESTIMATED BLOOD LOSS:  500-600 mL.  PACKS:  None.  DRAINS:  Included urethral Foley.  INTRAOPERATIVE BLOOD PLACED:  None.  COMPLICATIONS:  None.  INDICATION:  Dictated in history and physical.  PROCEDURE IN DETAIL:  The patient was taken to the OR and placed in supine position with left lateral tilt.  After satisfactory level of spinal anesthesia was obtained, the abdomen was prepped with Betadine and draped in sterile field.  A prior low transverse skin incision was excised.  This was sent to Pathology.  There was a nevus located to the left side of the incision.  The incision was then extended through subcutaneous tissue.  Fascia was entered sharply and incision was fashioned laterally.  Fascia taken off the muscle superiorly and inferiorly.  Rectus muscles were separated in the midline.  Peritoneum was entered sharply.  Incision of peritoneum extended both superiorly and inferiorly.  Low transverse bladder flap was developed.  Low transverse uterine incision begun with a knife, extended laterally using manual traction.  Amniotic fluid was clear.  The infant presented in the vertex presentation was delivered with elevation of head and  fundal pressure.  It was a viable female, weighing 8 pounds 6 ounces.  Apgars were 8 and 9.  Placenta was then delivered manually.  Uterus exteriorized for closure.  Uterus was closed in a running locking suture of 0 chromic using a two-layer closure technique.  The patient was desirous of permanent sterilization.  Mid segment of each tube was elevated with a Babcock tenaculum.  A hole was made in avascular of the mesosalpinx using the Bovie.  Individual ligatures of 0 plain catgut were used to ligate a segment of tube.  The intervening segment tube was then excised.  Cut ends of the tubes were cauterized using the Bovie.  At this point in time, we had good hemostasis at the incision and both tubal ligation.  Urine output was clear and adequate. Uterus was returned to the abdominal cavity.  We thoroughly irrigated the pelvis and had good hemostasis.  Muscles of peritoneum were closed with running suture of 3-0 Vicryl.  Fascia closed in a running suture of 0 PDS.  Subcu was closed with interrupted sutures of 2-0 plain catgut. Skin was closed in running subcuticular of 3-0 Monocryl and Dermabond. Sponge, instrument, and needle count was reported as correct by circulating nurse x2.  Foley catheter remained clear at the time of closure.  The patient tolerated the procedure well, was returned to the recovery room in good condition.     Juluis Mire, M.D.     JSM/MEDQ  D:  11/28/2011  T:  11/28/2011  Job:  [id number]

## 2011-11-28 NOTE — Plan of Care (Signed)
Problem: Discharge Progression Outcomes Goal: Remove staples per MD order Outcome: Not Applicable Date Met:  11/28/11 Pt has dermabond

## 2011-11-28 NOTE — Anesthesia Postprocedure Evaluation (Signed)
  Anesthesia Post-op Note  Patient: Julie Mcneil  Procedure(s) Performed:  CESAREAN SECTION WITH BILATERAL TUBAL LIGATION - REPEAT  Patient Location: PACU and Mother/Baby  Anesthesia Type: Spinal  Level of Consciousness: awake, alert  and oriented  Airway and Oxygen Therapy: Patient Spontanous Breathing   Post-op Assessment: Patient's Cardiovascular Status Stable and Respiratory Function Stable  Post-op Vital Signs: stable  Complications: No apparent anesthesia complications

## 2011-11-28 NOTE — Anesthesia Procedure Notes (Addendum)
Epidural   Anesthesia Regional Block:   Narrative:    Spinal   Spinal  Patient location during procedure: OR Start time: 11/28/2011 7:35 AM Staffing Anesthesiologist: Brayton Caves R Performed by: anesthesiologist  Preanesthetic Checklist Completed: patient identified, site marked, surgical consent, pre-op evaluation, timeout performed, IV checked, risks and benefits discussed and monitors and equipment checked Spinal Block Patient position: sitting Prep: DuraPrep Patient monitoring: heart rate, cardiac monitor, continuous pulse ox and blood pressure Approach: midline Location: L3-4 Injection technique: single-shot Needle Needle type: Sprotte  Needle gauge: 24 G Needle length: 9 cm Assessment Sensory level: T4 Additional Notes Patient identified.  Risk benefits discussed including failed block, incomplete pain control, headache, nerve damage, paralysis, blood pressure changes, nausea, vomiting, reactions to medication both toxic or allergic, and postpartum back pain.  Patient expressed understanding and wished to proceed.  All questions were answered.  Sterile technique used throughout procedure.  CSF was clear.  No parasthesia or other complications.  Please see nursing notes for vital signs.

## 2011-11-28 NOTE — H&P (Signed)
  History and physical exam unchanged 

## 2011-11-28 NOTE — Anesthesia Postprocedure Evaluation (Signed)
  Anesthesia Post-op Note  Patient: Julie Mcneil  Procedure(s) Performed:  CESAREAN SECTION WITH BILATERAL TUBAL LIGATION - REPEAT   Patient is awake, responsive, moving her legs, and has signs of resolution of her numbness. Pain and nausea are reasonably well controlled. Vital signs are stable and clinically acceptable. Oxygen saturation is clinically acceptable. There are no apparent anesthetic complications at this time. Patient is ready for discharge.

## 2011-11-28 NOTE — Anesthesia Preprocedure Evaluation (Signed)
Anesthesia Evaluation  Patient identified by MRN, date of birth, ID band Patient awake    Reviewed: Allergy & Precautions, H&P , NPO status , Patient's Chart, lab work & pertinent test results  Airway Mallampati: III      Dental No notable dental hx.    Pulmonary neg pulmonary ROS,  clear to auscultation  Pulmonary exam normal       Cardiovascular Exercise Tolerance: Good neg cardio ROS regular Normal    Neuro/Psych Negative Neurological ROS  Negative Psych ROS   GI/Hepatic negative GI ROS, Neg liver ROS, GERD-  ,  Endo/Other  Negative Endocrine ROSMorbid obesity  Renal/GU negative Renal ROS  Genitourinary negative   Musculoskeletal   Abdominal Normal abdominal exam  (+)   Peds  Hematology negative hematology ROS (+)   Anesthesia Other Findings   Reproductive/Obstetrics (+) Pregnancy                           Anesthesia Physical Anesthesia Plan  ASA: III  Anesthesia Plan: Spinal   Post-op Pain Management:    Induction:   Airway Management Planned:   Additional Equipment:   Intra-op Plan:   Post-operative Plan:   Informed Consent: I have reviewed the patients History and Physical, chart, labs and discussed the procedure including the risks, benefits and alternatives for the proposed anesthesia with the patient or authorized representative who has indicated his/her understanding and acceptance.     Plan Discussed with: Anesthesiologist, CRNA and Surgeon  Anesthesia Plan Comments:         Anesthesia Quick Evaluation

## 2011-11-28 NOTE — Addendum Note (Signed)
Addendum  created 11/28/11 1647 by Len Blalock, CRNA   Modules edited:Notes Section

## 2011-11-28 NOTE — Brief Op Note (Signed)
11/28/2011  8:36 AM  PATIENT:  Max Sane  31 y.o. female  PRE-OPERATIVE DIAGNOSIS:  2 PREVIOUS;desire steriliztion  POST-OPERATIVE DIAGNOSIS:  2 PREVIOUS;desire steriliztion  PROCEDURE:  Procedure(s): CESAREAN SECTION WITH BILATERAL TUBAL LIGATION  SURGEON:  Surgeon(s): Juluis Mire, MD  PHYSICIAN ASSISTANT:   ASSISTANTS: none   ANESTHESIA:   spinal  EBL:  Total I/O In: 3000 [I.V.:3000] Out: 950 [Urine:150; Blood:800]  BLOOD ADMINISTERED:none  DRAINS: Urinary Catheter (Foley)   LOCAL MEDICATIONS USED:  NONE  SPECIMEN:  Source of Specimen:  skin incision  DISPOSITION OF SPECIMEN:  PATHOLOGY  COUNTS:  YES  TOURNIQUET:  * No tourniquets in log *  DICTATION: .Other Dictation: Dictation Number [id number]  PLAN OF CARE: Admit to inpatient   PATIENT DISPOSITION:  PACU - hemodynamically stable.   Delay start of Pharmacological VTE agent (>24hrs) due to surgical blood loss or risk of bleeding:  {YES/NO/NOT APPLICABLE:20182

## 2011-11-28 NOTE — Transfer of Care (Signed)
Immediate Anesthesia Transfer of Care Note  Patient: Julie Mcneil  Procedure(s) Performed:  CESAREAN SECTION WITH BILATERAL TUBAL LIGATION - REPEAT  Patient Location: PACU  Anesthesia Type: Spinal  Level of Consciousness: awake, alert  and oriented  Airway & Oxygen Therapy: Patient Spontanous Breathing  Post-op Assessment: Report given to PACU RN and Post -op Vital signs reviewed and stable  Post vital signs: Reviewed and stable  Complications: No apparent anesthesia complications

## 2011-11-29 LAB — CBC
HCT: 28 % — ABNORMAL LOW (ref 36.0–46.0)
Hemoglobin: 8.9 g/dL — ABNORMAL LOW (ref 12.0–15.0)
MCH: 28.5 pg (ref 26.0–34.0)
MCHC: 31.8 g/dL (ref 30.0–36.0)
MCV: 89.7 fL (ref 78.0–100.0)
Platelets: 210 10*3/uL (ref 150–400)
RBC: 3.12 MIL/uL — ABNORMAL LOW (ref 3.87–5.11)
RDW: 16.8 % — ABNORMAL HIGH (ref 11.5–15.5)
WBC: 11.6 10*3/uL — ABNORMAL HIGH (ref 4.0–10.5)

## 2011-11-29 MED ORDER — TETANUS-DIPHTH-ACELL PERTUSSIS 5-2.5-18.5 LF-MCG/0.5 IM SUSP
0.5000 mL | Freq: Once | INTRAMUSCULAR | Status: DC
  Filled 2011-11-29: qty 0.5

## 2011-11-29 NOTE — Progress Notes (Signed)
Subjective: Postpartum Day 1: Cesarean Delivery Patient reports incisional pain, tolerating PO, + flatus and no problems voiding.    Objective: Vital signs in last 24 hours: Temp:  [97.8 F (36.6 C)-98.8 F (37.1 C)] 98.2 F (36.8 C) (01/23 0345) Pulse Rate:  [75-99] 80  (01/23 0345) Resp:  [13-22] 18  (01/23 0345) BP: (87-135)/(46-87) 87/56 mmHg (01/23 0345) SpO2:  [96 %-100 %] 98 % (01/23 0345) Weight:  [122.925 kg (271 lb)] 122.925 kg (271 lb) (01/22 1100)  Physical Exam:  General: alert and cooperative Lochia: appropriate Uterine Fundus: firm Incision: healing well DVT Evaluation: No evidence of DVT seen on physical exam.   Basename 11/29/11 0533  HGB 8.9*  HCT 28.0*    Assessment/Plan: Status post Cesarean section. Doing well postoperatively.  Continue current care.  CURTIS,CAROL G 11/29/2011, 8:03 AM

## 2011-11-30 NOTE — Progress Notes (Signed)
Subjective: Postpartum Day 2: Cesarean Delivery Patient reports tolerating PO, + BM and no problems voiding.    Objective: Vital signs in last 24 hours: Temp:  [97.7 F (36.5 C)-98.3 F (36.8 C)] 98.1 F (36.7 C) (01/24 0620) Pulse Rate:  [68-87] 82  (01/24 0620) Resp:  [18] 18  (01/24 0620) BP: (99-116)/(53-76) 110/76 mmHg (01/24 0620) SpO2:  [99 %] 99 % (01/23 0910)  Physical Exam:  General: alert and cooperative Lochia: appropriate Uterine Fundus: firm Incision: healing well DVT Evaluation: No evidence of DVT seen on physical exam.   Basename 11/29/11 0533  HGB 8.9*  HCT 28.0*    Assessment/Plan: Status post Cesarean section. Doing well postoperatively.  Continue current care.  Lance Galas G 11/30/2011, 8:02 AM

## 2011-12-01 MED ORDER — IBUPROFEN 600 MG PO TABS: 600.0000 mg | ORAL_TABLET | Freq: Four times a day (QID) | ORAL | Status: AC | PRN

## 2011-12-01 MED ORDER — OXYCODONE-ACETAMINOPHEN 5-325 MG PO TABS: 1.0000 | ORAL_TABLET | ORAL | Status: AC | PRN

## 2011-12-01 NOTE — Discharge Summary (Signed)
Obstetric Discharge Summary Reason for Admission: cesarean section Prenatal Procedures: ultrasound Intrapartum Procedures: cesarean: low cervical, transverse Postpartum Procedures: none Complications-Operative and Postpartum: none Hemoglobin  Date Value Range Status  11/29/2011 8.9* 12.0-15.0 (g/dL) Final     HCT  Date Value Range Status  11/29/2011 28.0* 36.0-46.0 (%) Final    Discharge Diagnoses: Term Pregnancy-delivered  Discharge Information: Date: 12/01/2011 Activity: pelvic rest Diet: routine Medications: PNV, Ibuprofen and Percocet Condition: stable Instructions: refer to practice specific booklet Discharge to: home   Newborn Data: Live born female  Birth Weight: 8 lb 6.8 oz (3822 g) APGAR: ,   Home with mother.  Julie Mcneil G 12/01/2011, 8:16 AM

## 2011-12-01 NOTE — Progress Notes (Signed)
Subjective: Postpartum Day 3: Cesarean Delivery Patient reports tolerating PO, + BM and no problems voiding.    Objective: Vital signs in last 24 hours: Temp:  [97.3 F (36.3 C)-98 F (36.7 C)] 98 F (36.7 C) (01/25 0551) Pulse Rate:  [80-84] 80  (01/25 0551) Resp:  [18] 18  (01/25 0551) BP: (114-116)/(58-76) 114/72 mmHg (01/25 0551) SpO2:  [97 %] 97 % (01/24 2229)  Physical Exam:  General: alert, cooperative and moderately obese Lochia: appropriate Uterine Fundus: firm Incision: healing well DVT Evaluation: No evidence of DVT seen on physical exam.   Basename 11/29/11 0533  HGB 8.9*  HCT 28.0*    Assessment/Plan: Status post Cesarean section. Doing well postoperatively.  Discharge home with standard precautions and return to clinic in 1 week.  Jakeel Starliper G 12/01/2011, 8:08 AM

## 2011-12-01 NOTE — Progress Notes (Signed)
UR chart review completed.  

## 2012-03-21 ENCOUNTER — Ambulatory Visit (INDEPENDENT_AMBULATORY_CARE_PROVIDER_SITE_OTHER): Payer: 59 | Admitting: Women's Health

## 2012-03-21 ENCOUNTER — Encounter: Payer: Self-pay | Admitting: Women's Health

## 2012-03-21 DIAGNOSIS — R5383 Other fatigue: Secondary | ICD-10-CM

## 2012-03-21 DIAGNOSIS — T148XXA Other injury of unspecified body region, initial encounter: Secondary | ICD-10-CM

## 2012-03-21 DIAGNOSIS — R5381 Other malaise: Secondary | ICD-10-CM

## 2012-03-21 DIAGNOSIS — E669 Obesity, unspecified: Secondary | ICD-10-CM

## 2012-03-21 NOTE — Progress Notes (Signed)
Patient ID: Julie Mcneil, female   DOB: 07-07-1981, 31 y.o.   MRN: [id number] Presents with the complaint of fatigue, bruising on abdomen and upper thighs, amenorrheic, BTL, nursing exclusively 46-month-old baby who weighs 18 pounds. Denies any discharge, urinary symptoms, fever, or changes in bowel elimination. States on occasion will have some right lower quadrant cramping mostly associated with breast feeding. States baby sleeps 10 hours most nights and takes 3 hour naps, but also has a 7, 55-year-old daughters and watches 2 other children 3-4 days per week. Had postpartum exam March of 2013 with a normal Pap. H&H 8.9/28  11/2011 after delivery.  Exam: On abdomen 2 mildly ecchymotic 2 cm areas, that appear to be healing, on upper thighs several small ecchymotic areas that also appear to be healing. Abdomen obese nontender, external genitalia within normal limits, speculum exam: no discharge or odor noted, bimanual limited due to abdominal girth, but no CMT or adnexal fullness or tenderness.  Fatigue and mild bruising  Plan: CBC, TSH. Encouraged to continue prenatal vitamin daily, increase rest as able, encourage daily walking. Reviewed fatigue may be more related to busy life. Instructed to call if abdominal pain continues we'll check an ultrasound.

## 2012-03-21 NOTE — Patient Instructions (Signed)

## 2012-03-22 LAB — CBC WITH DIFFERENTIAL/PLATELET
Basophils Absolute: 0 10*3/uL (ref 0.0–0.1)
Basophils Relative: 0 % (ref 0–1)
Eosinophils Absolute: 0.3 10*3/uL (ref 0.0–0.7)
Eosinophils Relative: 4 % (ref 0–5)
HCT: 38.5 % (ref 36.0–46.0)
Hemoglobin: 12.1 g/dL (ref 12.0–15.0)
Lymphocytes Relative: 29 % (ref 12–46)
Lymphs Abs: 2.4 10*3/uL (ref 0.7–4.0)
MCH: 28.3 pg (ref 26.0–34.0)
MCHC: 31.4 g/dL (ref 30.0–36.0)
MCV: 90.2 fL (ref 78.0–100.0)
Monocytes Absolute: 0.5 10*3/uL (ref 0.1–1.0)
Monocytes Relative: 6 % (ref 3–12)
Neutro Abs: 5 10*3/uL (ref 1.7–7.7)
Neutrophils Relative %: 61 % (ref 43–77)
Platelets: 290 10*3/uL (ref 150–400)
RBC: 4.27 MIL/uL (ref 3.87–5.11)
RDW: 14.7 % (ref 11.5–15.5)
WBC: 8.2 10*3/uL (ref 4.0–10.5)

## 2012-03-22 LAB — TSH: TSH: 2.762 u[IU]/mL (ref 0.350–4.500)

## 2012-04-30 ENCOUNTER — Other Ambulatory Visit (INDEPENDENT_AMBULATORY_CARE_PROVIDER_SITE_OTHER): Payer: 59

## 2012-04-30 ENCOUNTER — Encounter: Payer: Self-pay | Admitting: Internal Medicine

## 2012-04-30 ENCOUNTER — Ambulatory Visit (INDEPENDENT_AMBULATORY_CARE_PROVIDER_SITE_OTHER): Payer: 59 | Admitting: Internal Medicine

## 2012-04-30 VITALS — BP 110/72 | HR 73 | Temp 98.2°F | Ht 64.0 in | Wt 249.4 lb

## 2012-04-30 DIAGNOSIS — Z0000 Encounter for general adult medical examination without abnormal findings: Secondary | ICD-10-CM

## 2012-04-30 DIAGNOSIS — R002 Palpitations: Secondary | ICD-10-CM

## 2012-04-30 DIAGNOSIS — E669 Obesity, unspecified: Secondary | ICD-10-CM

## 2012-04-30 LAB — URINALYSIS, ROUTINE W REFLEX MICROSCOPIC
Bilirubin Urine: NEGATIVE
Ketones, ur: NEGATIVE
Leukocytes, UA: NEGATIVE
Nitrite: NEGATIVE
Specific Gravity, Urine: 1.025 (ref 1.000–1.030)
Total Protein, Urine: NEGATIVE
Urine Glucose: NEGATIVE
Urobilinogen, UA: 0.2 (ref 0.0–1.0)
pH: 6 (ref 5.0–8.0)

## 2012-04-30 LAB — CBC WITH DIFFERENTIAL/PLATELET
Basophils Absolute: 0 10*3/uL (ref 0.0–0.1)
Basophils Relative: 0.1 % (ref 0.0–3.0)
Eosinophils Absolute: 0.2 10*3/uL (ref 0.0–0.7)
Eosinophils Relative: 2.2 % (ref 0.0–5.0)
HCT: 34.8 % — ABNORMAL LOW (ref 36.0–46.0)
Hemoglobin: 11.7 g/dL — ABNORMAL LOW (ref 12.0–15.0)
Lymphocytes Relative: 28.1 % (ref 12.0–46.0)
Lymphs Abs: 2.7 10*3/uL (ref 0.7–4.0)
MCHC: 33.6 g/dL (ref 30.0–36.0)
MCV: 88.1 fl (ref 78.0–100.0)
Monocytes Absolute: 0.3 10*3/uL (ref 0.1–1.0)
Monocytes Relative: 3.4 % (ref 3.0–12.0)
Neutro Abs: 6.4 10*3/uL (ref 1.4–7.7)
Neutrophils Relative %: 66.2 % (ref 43.0–77.0)
Platelets: 226 10*3/uL (ref 150.0–400.0)
RBC: 3.95 Mil/uL (ref 3.87–5.11)
RDW: 13.5 % (ref 11.5–14.6)
WBC: 9.7 10*3/uL (ref 4.5–10.5)

## 2012-04-30 LAB — HEPATIC FUNCTION PANEL
ALT: 11 U/L (ref 0–35)
AST: 19 U/L (ref 0–37)
Albumin: 3.7 g/dL (ref 3.5–5.2)
Alkaline Phosphatase: 106 U/L (ref 39–117)
Bilirubin, Direct: 0.1 mg/dL (ref 0.0–0.3)
Total Bilirubin: 0.8 mg/dL (ref 0.3–1.2)
Total Protein: 7.2 g/dL (ref 6.0–8.3)

## 2012-04-30 LAB — BASIC METABOLIC PANEL
BUN: 12 mg/dL (ref 6–23)
CO2: 25 mEq/L (ref 19–32)
Calcium: 8.9 mg/dL (ref 8.4–10.5)
Chloride: 103 mEq/L (ref 96–112)
Creatinine, Ser: 0.6 mg/dL (ref 0.4–1.2)
GFR: 126.73 mL/min (ref >60.00)
Glucose, Bld: 90 mg/dL (ref 70–99)
Potassium: 4.1 mEq/L (ref 3.5–5.1)
Sodium: 137 mEq/L (ref 135–145)

## 2012-04-30 LAB — LIPID PANEL
Cholesterol: 178 mg/dL (ref 0–200)
HDL: 58.9 mg/dL (ref >39.00)
LDL Cholesterol: 100 mg/dL — ABNORMAL HIGH (ref 0–99)
Total CHOL/HDL Ratio: 3
Triglycerides: 96 mg/dL (ref 0.0–149.0)
VLDL: 19.2 mg/dL (ref 0.0–40.0)

## 2012-04-30 LAB — TSH: TSH: 2.27 u[IU]/mL (ref 0.35–5.50)

## 2012-04-30 NOTE — Progress Notes (Signed)
Subjective:    Patient ID: Julie Mcneil, female    DOB: 1981/09/27, 31 y.o.   MRN: [id number]  HPI New pt to me and our practice, here to establish care-also patient is here today for annual physical. Patient feels well overall.  Concerned about episodic palpations: fluttering in chest 2-3x/day, lasts 10-20 seconds No syncope, dizziness or chest tightness -  no hx same symptoms onset in past 6 weeks ?related to increase Coke intake 3/d (previously none during pregnancy)  No hx syncope or other heart problems; no edema  Past Medical History  Diagnosis Date  . Vaginal yeast infection     recurrent  . UTI (urinary tract infection)     recurrent  . GERD (gastroesophageal reflux disease)   . Obesity 03/21/2012   Family History  Problem Relation Age of Onset  . Anesthesia problems Neg Hx   . Hypotension Neg Hx   . Malignant hyperthermia Neg Hx   . Pseudochol deficiency Neg Hx   . Early death Mother   . Heart disease Maternal Grandmother   . Diabetes Maternal Grandmother   . Cancer Maternal Grandmother   . Hypertension Paternal Grandmother   . Hypertension Paternal Grandfather    History  Substance Use Topics  . Smoking status: Never Smoker   . Smokeless tobacco: Never Used  . Alcohol Use: No     Review of Systems Constitutional: Negative for fever or weight change. Positive for fatigue Respiratory: Negative for cough and shortness of breath.   Cardiovascular: Negative for chest pain or palpitations.  Gastrointestinal: vague occasional RUQ/epigastric abdominal pain, no bowel changes. no nausea and vomiting  GU: no menses x 6mo (since delivery, breastfeeding) Musculoskeletal: Negative for gait problem or joint swelling.  Skin: Negative for rash.  Neurological: Negative for dizziness or headache.  No other specific complaints in a complete review of systems (except as listed in HPI above).     Objective:   Physical Exam BP 110/72  Pulse 73  Temp 98.2 F (36.8 C)  (Oral)  Ht 5\' 4"  (1.626 m)  Wt 249 lb 6.4 oz (113.127 kg)  BMI 42.81 kg/m2  SpO2 98% Wt Readings from Last 3 Encounters:  04/30/12 249 lb 6.4 oz (113.127 kg)  11/28/11 271 lb (122.925 kg)  11/28/11 271 lb (122.925 kg)   Constitutional: She is obese, but appears well-developed and well-nourished. No distress.  infant dtr at side HENT: Head: Normocephalic and atraumatic. Ears: B TMs ok, no erythema or effusion; Nose: Nose normal. Mouth/Throat: Oropharynx is clear and moist. No oropharyngeal exudate.  Eyes: Conjunctivae and EOM are normal. Pupils are equal, round, and reactive to light. No scleral icterus.  Neck: thick, Normal range of motion. Neck supple. No JVD present. No thyromegaly present.  Cardiovascular: Normal rate, regular rhythm and normal heart sounds.  No murmur heard. No BLE edema. Pulmonary/Chest: Effort normal and breath sounds normal. No respiratory distress. She has no wheezes.  Abdominal: obese, soft. Bowel sounds are normal. She exhibits no distension. There is mild epigastric tenderness to deep palpation. no masses Musculoskeletal: Normal range of motion, no joint effusions. No gross deformities Neurological: She is alert and oriented to person, place, and time. No cranial nerve deficit. Coordination normal.  Skin: Skin is warm and dry. No rash noted. No erythema.  Psychiatric: She has a normal mood and affect. Her behavior is normal. Judgment and thought content normal.   Lab Results  Component Value Date   WBC 8.2 03/21/2012   HGB 12.1 03/21/2012  HCT 38.5 03/21/2012   PLT 290 03/21/2012   GLUCOSE 97 08/19/2011   ALT 7 08/19/2011   AST 10 08/19/2011   NA 133* 08/19/2011   K 4.0 08/19/2011   CL 102 08/19/2011   CREATININE 0.47* 08/19/2011   BUN 7 08/19/2011   CO2 24 08/19/2011   TSH 2.762 03/21/2012    ECG: NSR @ 69bpm - no ischemic change     Assessment & Plan:  CPX/v70.0 - Patient has been counseled on age-appropriate routine health concerns for screening  and prevention. These are reviewed and up-to-date. Immunizations are up-to-date or declined. Labs ordered and will be reviewed.  Also see problem list. Medications and labs reviewed today.

## 2012-04-30 NOTE — Assessment & Plan Note (Signed)
Suspect overlap with increase caffeine use and stress/"anxiety" - hx panic attacks but current symptoms "different" ECG today as above - Advised decrease stimulant use and labs as for CPX above No other med tx required at this time

## 2012-04-30 NOTE — Assessment & Plan Note (Signed)
The patient is asked to make an attempt to improve diet and exercise patterns to aid in medical management of this problem.  

## 2012-04-30 NOTE — Patient Instructions (Signed)
It was good to see you today. We have reviewed your prior records including labs and tests today ECG looks good - no evidence for heart problems Test(s) ordered today. Your results will be called to you after review (48-72hours after test completion). If any changes need to be made, you will be notified at that time. Health Maintenance reviewed - all recommended immunizations and age-appropriate screenings are up-to-date. Reduce caffeine intake as discussed and avoid other stimulants Please schedule followup in 1 year, call sooner if problems.  Palpitations   A palpitation is the feeling that your heartbeat is irregular or is faster than normal. Although this is frightening, it usually is not serious. Palpitations may be caused by excesses of smoking, caffeine, or alcohol. They are also brought on by stress and anxiety. Sometimes, they are caused by heart disease. Unless otherwise noted, your caregiver did not find any signs of serious illness at this time. HOME CARE INSTRUCTIONS   To help prevent palpitations:  Drink decaffeinated coffee, tea, and soda pop. Avoid chocolate.   If you smoke or drink alcohol, quit or cut down as much as possible.   Reduce your stress or anxiety level. Biofeedback, yoga, or meditation will help you relax. Physical activity such as swimming, jogging, or walking also may be helpful.  SEEK MEDICAL CARE IF:    You continue to have a fast heartbeat.   Your palpitations occur more often.  SEEK IMMEDIATE MEDICAL CARE IF: You develop chest pain, shortness of breath, severe headache, dizziness, or fainting. Document Released: 10/20/2000 Document Revised: 10/12/2011 Document Reviewed: 12/20/2007 [facility] Patient Information 2012 [place], [place].

## 2012-06-13 ENCOUNTER — Ambulatory Visit (HOSPITAL_COMMUNITY)
Admission: RE | Admit: 2012-06-13 | Discharge: 2012-06-13 | Disposition: A | Discharge status: Home / Self Care | Payer: 59 | Source: Ambulatory Visit | Attending: Obstetrics and Gynecology | Admitting: Obstetrics and Gynecology

## 2012-06-13 NOTE — Progress Notes (Signed)
Adult Lactation Consultation Outpatient Visit Note  Patient Name: Julie Mcneil                             Julie Mcneil, DOB [birth year], approx 7 months old Date of Birth: 19-Aug-1981 Gestational Age at Delivery: Unknown Type of Delivery:   Breastfeeding History: Frequency of Breastfeeding: every 6 hours during the day, when not distracted every 2-3 hours during the day, at night every 2-3 hours Length of Feeding:  Voids: 8 per day Stools: 1 every 3-4 days, this is Julie's usual pattern.  Supplementing / Method: Pumping:  Type of Pump:  DEBP   Frequency:  1 time/day after morning breastfeed  Volume: 2-3 oz.   Comments: Mom is here because Julie Thea Mcneil has been breastfeeding less and she feels her milk supply is decreasing. She has also started [employer] on rice cereal, some vegetables and fruits. Per weight today, Mom reports Pecola Leisure Thea Mcneil has not gained any weight in the past 2 weeks. She has 2 other children and Julie Thea Mcneil is very distracted at the breast. She nurses on the 1st breast a few minutes then stops then  nurses on the 2nd breast then stops. She continues this pattern for about 10 minutes during the day. Mom is not quite ready to wean her Julie and would like to know how to increase her breast milk supply. She is also concerned about no weight gain for Preston Fleeting in the past 2 weeks. Julie Thea Mcneil is approx 7 months old and her weight today is 21 lb. 9.4 oz. Mom reports she is in the 93% for her age per Peds.    Consultation Evaluation: At this feeding assessment, Julie Thea Mcneil would not stay engaged at the breast. She was very distracted and while at the breast for 10-15 minutes she probably on nursed for 5 minutes.  Initial Feeding Assessment: Pre-feed Weight:  21 lb. 9.4 oz/9792 gm Post-feed Weight:  21 lb. 11.0 oz/9839 gm Amount Transferred:  46 ml Comments:  With nursing approx 5 minutes.   Additional Feeding Assessment: Pre-feed  Weight: Post-feed Weight: Amount Transferred: Comments:  Additional Feeding Assessment: Pre-feed Weight: Post-feed Weight: Amount Transferred: Comments:  Total Breast milk Transferred this Visit:  Total Supplement Given:   Additional Interventions: Discussed with mom if she wants to increase her milk supply, she needs to keep Preston Fleeting at the breast for longer periods of time and more frequent, and for now she would need to post pump during the day at least 4-6 times/day. Gave her information on supplements. With Preston Fleeting in the 93% for weight reassured her not to worry about weight at this time since she is getting calories from supplementary foods now and she is getting more active at this age. Also discussed she may be choosing to wean herself now. Mom seemed reassured.   Follow-Up prn     Alfred Levins 06/13/2012, 6:13 PM

## 2012-07-17 ENCOUNTER — Ambulatory Visit (INDEPENDENT_AMBULATORY_CARE_PROVIDER_SITE_OTHER): Payer: 59 | Admitting: Women's Health

## 2012-07-17 ENCOUNTER — Encounter: Payer: Self-pay | Admitting: Women's Health

## 2012-07-17 DIAGNOSIS — B372 Candidiasis of skin and nail: Secondary | ICD-10-CM

## 2012-07-17 MED ORDER — NYSTATIN-TRIAMCINOLONE 100000-0.1 UNIT/GM-% EX OINT: TOPICAL_OINTMENT | CUTANEOUS | Status: DC

## 2012-07-17 NOTE — Patient Instructions (Addendum)
Breastfeeding, Mastitis Breastfeeding is usually the best way to feed your baby. Breastfed babies tend to be healthier and less affected by disease. Breastfed babies may have better brain development and be less likely to be overweight than formula-fed babies. Breastfeeding also benefits the mother. Breastfeeding reduces the risk of breast cancer. It will give you time to be close to your baby and helps create a strong bond. It also delays the return of your periods, stimulates your uterus to contract back to normal, and helps you lose some of the weight you gained during pregnancy. Breastfeeding should not be painful. If there is tenderness at first, it should gradually go away as the days go by. Poor latch-on and positioning are common causes of sore nipples. This is usually because the baby is not getting enough of the areola (the colored portion around the nipple) into his or her mouth, and is sucking mostly on the nipple. In general, nurse early and often, nurse with the nipple and areola in the baby's mouth, not just the nipple and feed your baby on demand. CAUSES  It is common for many women to have a plugged duct in the breast at some point if she breastfeeds. A plugged milk duct feels like a tender, sore, lump in the breast. It is not accompanied by a fever or other symptoms. It happens when a milk duct does not properly drain, and the breast becomes inflamed (red and sore). Then, pressure builds up behind the plug. A plugged duct usually only occurs in one breast at a time.   A breast infection (mastitis), on the other hand, is soreness or a lump in the breast that can be accompanied by a fever or flu-like symptoms, such as feeling run down or very achy. Some women with a breast infection also have nausea and vomiting. You also may have yellowish discharge from the nipple that looks like colostrum, or the breasts feel warm or hot to the touch and appear pink or red. A breast infection can occur when  other family members have a cold or the flu, and like a plugged duct, it usually only occurs in one breast. It is not always easy to tell the difference between a breast infection and a plugged duct because both have similar symptoms and can improve within 24 to 48 hours.   Using one position to breastfeed may not empty your breasts properly or completely and could lead to engorgement and mastitis.   Wearing a bra that is too tight may restrict the flow of your milk.   Mastitis develops because bacteria get under the skin through cracks in the nipple and into the breasts and an infection develops.  TREATMENT    Breastfeed or pump the breast more often on the affected side. This helps loosen the plug, keeps the milk moving freely, and the breast from becoming overly full. Nursing every 2 hours, both day and night on the affected side first can be helpful.   Getting extra sleep or relaxing with your feet up can help speed healing. Often, a plugged duct or breast infection is the first sign that a mother is doing too much and becoming overly tired.   Wear a well-fitting, supportive bra that is not too tight, since this can constrict milk ducts.   If you do not feel better within 24 hours of trying these steps, and you have a fever or your symptoms worsen, call your caregiver. You may need an antibiotic. If you have   a breast infection in which both breasts look affected, or there is pus or blood in the milk, red streaks near the painful area, or your symptoms came suddenly, see your caregiver right away.   Even if you need an antibiotic, continuing to breastfeed during treatment is best for both you and your baby. Most antibiotics passed in your breast milk will not hurt your baby.   Increase your fluid intake especially if you have a fever   If mastitis is not treated quickly and properly, you may develop a breast abscess.  THRUSH   Thrush (yeast) is a fungal infection that can form on your  nipples or in your breast because it thrives on milk.   The infection forms from an overgrowth of the candida organism. Candida usually lives in our bodies and is kept at healthy levels by the natural bacteria in our bodies. But, when the natural balance of bacteria is upset, candida can overgrow, causing an infection. Some of the things that can cause thrush include:    Having an overly moist environment on your skin or nipples that are sore or cracked.   Taking antibiotics, birth control pills, or steroids.   Having a diet that contains large amounts of sugar or foods with yeast.   Women with diabetes have a higher incidence of thrush and fungal infections.   Having a chronic illness like HIV infection, diabetes, or anemia.  These are symptoms of thrush:  Having sore nipples that last more than a few days, even when your baby's latch and positioning is correct.   Getting sore nipples suddenly after several weeks of normal nursing.   Pink, flaky, shiny, itchy, or cracked nipples, or deep pink and blistered nipples.   Shooting pains deep in the breast during or after feedings, or achy breasts. This may be from a candida infection of the milk ducts.   The infection can form in your baby's mouth from having contact with your nipples, and appear as little white spots on the inside of the cheeks, gums, or tongue. It also can appear as a diaper rash (small red dots around a rash) on your baby that will not go away by using regular diaper rash ointments. Many babies with thrush refuse to nurse, or are gassy or cranky.  HOME CARE INSTRUCTIONS  If you or your baby have any of the above symptoms, contact your caregiver and your baby's caregiver so you both can be correctly diagnosed.   You can get medication for your nipples and for your baby. Medication for a mother may be an ointment for the nipples, and your baby can be given a liquid medication for his or her mouth, or an ointment for any  diaper rash.   Change disposable nursing pads often and wash any towels or clothing that have come in contact with the yeast in very hot water above 122 F (50 C).   Wear a clean bra every day.   Only use cotton pads.   Wash your hands often, and wash your baby's hands often, especially if he or she sucks on his or her fingers.   Boil any pacifiers, bottle nipples, or toys your baby puts in his or her mouth once a day for 20 minutes to kill the thrush. After 1 week of treatment, discard pacifiers and nipples and buy new ones. Boil all breast pump parts that touch the milk for 20 minutes daily.   Make sure other family members are free of   thrush or other fungal infections. If they have symptoms, get treatment for them.  MAKE SURE YOU:    Understand these instructions.   Will watch your condition.   Will get help right away if you are not doing well or get worse.  Document Released: 02/17/2005 Document Revised: 10/12/2011 Document Reviewed: 07/08/2008 [employer] Patient Information 2012 [employer]. 

## 2012-07-17 NOTE — Progress Notes (Signed)
Patient ID: Julie Mcneil, female   DOB: 03/20/1981, 32 y.o.   MRN: [id number] Presents with complaint of right breast nipple pain for several days. Denies a fever. Breast-feeding 2-month-old every 3 - 4 hours. Denies leakage of milk. Denies thrush in baby. Having a regular monthly cycle/BTL.  Exam: Breast exam and sitting and lying position, no palpable nodules or erythematous areas. Right breast nipple erythematous.  Probable nipple yeast  Plan: Reviewed importance of keeping breasts clean and dry. Will try Mycolog cream, apply small amount to right nipple after feeding, wash off prior to feeding the baby. Instructed to call if symptoms do not dissipate or if fever develops.

## 2012-09-16 ENCOUNTER — Ambulatory Visit (INDEPENDENT_AMBULATORY_CARE_PROVIDER_SITE_OTHER): Payer: 59 | Admitting: Internal Medicine

## 2012-09-16 ENCOUNTER — Other Ambulatory Visit (INDEPENDENT_AMBULATORY_CARE_PROVIDER_SITE_OTHER): Payer: 59

## 2012-09-16 ENCOUNTER — Encounter: Payer: Self-pay | Admitting: Internal Medicine

## 2012-09-16 VITALS — BP 118/80 | HR 81 | Temp 98.0°F | Resp 16 | Wt 249.0 lb

## 2012-09-16 DIAGNOSIS — J069 Acute upper respiratory infection, unspecified: Secondary | ICD-10-CM

## 2012-09-16 DIAGNOSIS — R1012 Left upper quadrant pain: Secondary | ICD-10-CM

## 2012-09-16 DIAGNOSIS — R6883 Chills (without fever): Secondary | ICD-10-CM

## 2012-09-16 DIAGNOSIS — A088 Other specified intestinal infections: Secondary | ICD-10-CM

## 2012-09-16 DIAGNOSIS — A084 Viral intestinal infection, unspecified: Secondary | ICD-10-CM

## 2012-09-16 LAB — URINALYSIS, ROUTINE W REFLEX MICROSCOPIC
Bilirubin Urine: NEGATIVE
Ketones, ur: NEGATIVE
Leukocytes, UA: NEGATIVE
Nitrite: NEGATIVE
Specific Gravity, Urine: >=1.03 (ref 1.000–1.030)
Total Protein, Urine: NEGATIVE
Urine Glucose: NEGATIVE
Urobilinogen, UA: 0.2 (ref 0.0–1.0)
pH: 5.5 (ref 5.0–8.0)

## 2012-09-16 NOTE — Progress Notes (Signed)
  Subjective:    Patient ID: Julie Mcneil, female    DOB: 1981/01/28, 31 y.o.   MRN: [id number]  Diarrhea  This is a new problem. The current episode started today. The problem occurs less than 2 times per day. The problem has been gradually improving. The stool consistency is described as watery. Associated symptoms include abdominal pain (LUQ, now resolved), chills and a URI. Pertinent negatives include no bloating, fever, headaches, increased  flatus, myalgias, sweats or vomiting. Nothing aggravates the symptoms. There are no known risk factors. She has tried nothing for the symptoms. There is no history of inflammatory bowel disease, irritable bowel syndrome, malabsorption, a recent abdominal surgery or short gut syndrome.    Past Medical History  Diagnosis Date  . Vaginal yeast infection     recurrent  . UTI (urinary tract infection)     recurrent  . GERD (gastroesophageal reflux disease)   . Obesity     Review of Systems  Constitutional: Positive for chills. Negative for fever.  Gastrointestinal: Positive for abdominal pain (LUQ, now resolved) and diarrhea. Negative for vomiting, bloating and flatus.  Musculoskeletal: Negative for myalgias.  Neurological: Negative for headaches.       Objective:   Physical Exam BP 118/80  Pulse 81  Temp 98 F (36.7 C) (Oral)  Resp 16  Wt 249 lb (112.946 kg)  SpO2 97% Wt Readings from Last 3 Encounters:  09/16/12 249 lb (112.946 kg)  04/30/12 249 lb 6.4 oz (113.127 kg)  11/28/11 271 lb (122.925 kg)   Constitutional: She is overweight, but appears well-developed and well-nourished. No distress. nontoxic - Dtr at side Neck: Normal range of motion. Neck supple. No JVD or LAD present. No thyromegaly present.  Cardiovascular: Normal rate, regular rhythm and normal heart sounds.  No murmur heard. No BLE edema. Pulmonary/Chest: Effort normal and breath sounds normal. No respiratory distress. She has no wheezes.  Abdominal: Soft. Bowel  sounds are normal. She exhibits no distension. There is no tenderness. no masses Psychiatric: She has a normal mood and affect. Her behavior is normal. Judgment and thought content normal.   Lab Results  Component Value Date   WBC 9.7 04/30/2012   HGB 11.7* 04/30/2012   HCT 34.8* 04/30/2012   PLT 226.0 04/30/2012   GLUCOSE 90 04/30/2012   CHOL 178 04/30/2012   TRIG 96.0 04/30/2012   HDL 58.90 04/30/2012   LDLCALC 100* 04/30/2012   ALT 11 04/30/2012   AST 19 04/30/2012   NA 137 04/30/2012   K 4.1 04/30/2012   CL 103 04/30/2012   CREATININE 0.6 04/30/2012   BUN 12 04/30/2012   CO2 25 04/30/2012   TSH 2.27 04/30/2012       Assessment & Plan:   Chills x 1 episode today Diarrhea x 1 episode today - Mild LUQ pain, resolved  Suspect viral gastroenteritis - education on same provided Also check UA given hx recurrent UTI without other symptoms symptomatic care advised - empiric antibiotics if dirty UA

## 2012-09-16 NOTE — Patient Instructions (Signed)
It was good to see you today. If you develop worsening symptoms or fever, call and we can reconsider antibiotics, but it does not appear necessary to use antibiotics at this time. Test(s) ordered today. Your results will be released to MyChart (or called to you) after review, usually within 72hours after test completion. If any changes need to be made, you will be notified at that same time. Alternate between ibuprofen and tylenol for aches, pain and fever symptoms as discussed Viral Gastroenteritis Viral gastroenteritis is also known as stomach flu. This condition affects the stomach and intestinal tract. It can cause sudden diarrhea and vomiting. The illness typically lasts 3 to 8 days. Most people develop an immune response that eventually gets rid of the virus. While this natural response develops, the virus can make you quite ill. CAUSES   Many different viruses can cause gastroenteritis, such as rotavirus or noroviruses. You can catch one of these viruses by consuming contaminated food or water. You may also catch a virus by sharing utensils or other personal items with an infected person or by touching a contaminated surface. SYMPTOMS   The most common symptoms are diarrhea and vomiting. These problems can cause a severe loss of body fluids (dehydration) and a body salt (electrolyte) imbalance. Other symptoms may include:  Fever.   Headache.   Fatigue.   Abdominal pain.  DIAGNOSIS   Your caregiver can usually diagnose viral gastroenteritis based on your symptoms and a physical exam. A stool sample may also be taken to test for the presence of viruses or other infections. TREATMENT   This illness typically goes away on its own. Treatments are aimed at rehydration. The most serious cases of viral gastroenteritis involve vomiting so severely that you are not able to keep fluids down. In these cases, fluids must be given through an intravenous line (IV). HOME CARE INSTRUCTIONS    Drink  enough fluids to keep your urine clear or pale yellow. Drink small amounts of fluids frequently and increase the amounts as tolerated.   Ask your caregiver for specific rehydration instructions.   Avoid:   Foods high in sugar.   Alcohol.   Carbonated drinks.   Tobacco.   Juice.   Caffeine drinks.   Extremely hot or cold fluids.   Fatty, greasy foods.   Too much intake of anything at one time.   Dairy products until 24 to 48 hours after diarrhea stops.   You may consume probiotics. Probiotics are active cultures of beneficial bacteria. They may lessen the amount and number of diarrheal stools in adults. Probiotics can be found in yogurt with active cultures and in supplements.   Wash your hands well to avoid spreading the virus.   Only take over-the-counter or prescription medicines for pain, discomfort, or fever as directed by your caregiver. Do not give aspirin to children. Antidiarrheal medicines are not recommended.   Ask your caregiver if you should continue to take your regular prescribed and over-the-counter medicines.   Keep all follow-up appointments as directed by your caregiver.  SEEK IMMEDIATE MEDICAL CARE IF:    You are unable to keep fluids down.   You do not urinate at least once every 6 to 8 hours.   You develop shortness of breath.   You notice blood in your stool or vomit. This may look like coffee grounds.   You have abdominal pain that increases or is concentrated in one small area (localized).   You have persistent vomiting or diarrhea.  You have a fever.   The patient is a child younger than 3 months, and he or she has a fever.   The patient is a child older than 3 months, and he or she has a fever and persistent symptoms.   The patient is a child older than 3 months, and he or she has a fever and symptoms suddenly get worse.   The patient is a baby, and he or she has no tears when crying.  MAKE SURE YOU:    Understand these  instructions.   Will watch your condition.   Will get help right away if you are not doing well or get worse.  Document Released: 10/23/2005 Document Revised: 01/15/2012 Document Reviewed: 08/09/2011 [facility] Patient Information 2013 [place], [place].

## 2012-09-25 ENCOUNTER — Other Ambulatory Visit: Payer: Self-pay | Admitting: Gynecology

## 2012-09-25 ENCOUNTER — Ambulatory Visit (INDEPENDENT_AMBULATORY_CARE_PROVIDER_SITE_OTHER): Payer: 59

## 2012-09-25 ENCOUNTER — Encounter: Payer: Self-pay | Admitting: Women's Health

## 2012-09-25 ENCOUNTER — Ambulatory Visit (INDEPENDENT_AMBULATORY_CARE_PROVIDER_SITE_OTHER): Payer: 59 | Admitting: Women's Health

## 2012-09-25 DIAGNOSIS — R102 Pelvic and perineal pain: Secondary | ICD-10-CM

## 2012-09-25 DIAGNOSIS — E282 Polycystic ovarian syndrome: Secondary | ICD-10-CM

## 2012-09-25 DIAGNOSIS — N852 Hypertrophy of uterus: Secondary | ICD-10-CM

## 2012-09-25 DIAGNOSIS — N920 Excessive and frequent menstruation with regular cycle: Secondary | ICD-10-CM

## 2012-09-25 DIAGNOSIS — N8300 Follicular cyst of ovary, unspecified side: Secondary | ICD-10-CM

## 2012-09-25 MED ORDER — IBUPROFEN 600 MG PO TABS: 600.0000 mg | ORAL_TABLET | Freq: Three times a day (TID) | ORAL | Status: DC | PRN

## 2012-09-25 NOTE — Progress Notes (Signed)
Patient ID: Julie Mcneil, female   DOB: July 17, 1981, 31 y.o.   MRN: [id number] Presents with complaint of lower pelvic bloated discomfort/cramping that causes a stabbing sensation at times for the last few days. Rate 5-6 today, was a 2-3 yesterday. Denies urinary symptoms, vaginal discharge, changes in bowel elimination, or fever. Saw primary care last week had a negative urine. Irregular cycles/BTL continues to breast-feed 3-4 times per day, baby is 58 months old. Had a normal 4-5d cycle in September, 1 day cycle in October, no cycle July or August. History of irregular cycles/PCO S. Abdomen obese,  Exam: Appears well. No CVAT, external genitalia within normal limits, speculum exam cervix pink healthy with no noted discharge or erythema. Bimanual no CMT, minimal discomfort on right adnexal area.  Ultrasound: Anteverted enlarged uterus with prominent endometrial cavity try layered. Right ovary with numerous follicles perimeter of the ovary. Left ovary with numerous follicles perimeter of ovary. Negative cul-de-sac no apparent masses noted.  Probable PCO S. Pelvic bloating/pain  Plan: Options reviewed to withdrawal with Provera every 60 days if no cycle, birth control pills to regulate cycle, will try NuvaRing, sample, proper use, slight risk for blood clots and strokes reviewed, start with next cycle use for 2 cycles, if relief of pain/bloating will continue. Instructed to call if continued problems with irregular cycles/pain. Motrin 600 every 8 hours as needed. Reviewed importance of weight loss, regular exercise to help prevent complications of metabolic syndrome.

## 2012-11-04 ENCOUNTER — Telehealth: Payer: Self-pay | Admitting: *Deleted

## 2012-11-04 DIAGNOSIS — J329 Chronic sinusitis, unspecified: Secondary | ICD-10-CM

## 2012-11-04 MED ORDER — AZITHROMYCIN 250 MG PO TABS: 250.0000 mg | ORAL_TABLET | Freq: Every day | ORAL | Status: DC

## 2012-11-04 NOTE — Telephone Encounter (Signed)
Telephone call, states having ear pain with pressure, facial pressure, green nasal congestion, has had no relief with Mucinex and over-the-counter products. 2 of her 3 daughters have had bronchitis. Requesting a Z-Pak. Office visit if no relief.

## 2012-11-04 NOTE — Telephone Encounter (Signed)
Pt informed with the below note. She has tried robitussin, mucinex  As well but was told not to take a lot because she is breast feeding. She also tired generic zyrtec as well. Her cold has gotten worse and feels like an ear infection may start. Her children as on antibodies as well due to there cold.

## 2012-11-04 NOTE — Telephone Encounter (Signed)
Pt called for 2 things  1. She said that the nuvaring was uncomfortable for her and she would like something else. 2. Pt is requesting a Rx for cold, c/o coughing and running nose, with head congestion.

## 2012-11-04 NOTE — Telephone Encounter (Signed)
Please call and have her finish out current month with nuva ring. When time to start new pack could try Loestrin 1/20, instruct to take daily and have her call if does not have a cycle. Head cold is probably viral, have her try over-the-counter Mucinex and Delsym for the cough.

## 2012-11-15 ENCOUNTER — Telehealth: Payer: Self-pay | Admitting: *Deleted

## 2012-11-15 ENCOUNTER — Encounter: Payer: Self-pay | Admitting: Internal Medicine

## 2012-11-15 ENCOUNTER — Ambulatory Visit (INDEPENDENT_AMBULATORY_CARE_PROVIDER_SITE_OTHER): Payer: 59 | Admitting: Internal Medicine

## 2012-11-15 VITALS — BP 112/82 | HR 72 | Temp 98.1°F | Ht 64.0 in | Wt 256.0 lb

## 2012-11-15 DIAGNOSIS — J209 Acute bronchitis, unspecified: Secondary | ICD-10-CM

## 2012-11-15 DIAGNOSIS — J069 Acute upper respiratory infection, unspecified: Secondary | ICD-10-CM

## 2012-11-15 MED ORDER — PREDNISONE (PAK) 10 MG PO TABS: 10.0000 mg | ORAL_TABLET | ORAL | Status: DC

## 2012-11-15 MED ORDER — ALBUTEROL SULFATE (2.5 MG/3ML) 0.083% IN NEBU: 2.5000 mg | INHALATION_SOLUTION | Freq: Four times a day (QID) | RESPIRATORY_TRACT | Status: DC | PRN

## 2012-11-15 MED ORDER — PROMETHAZINE-DM 6.25-15 MG/5ML PO SYRP: 5.0000 mL | ORAL_SOLUTION | Freq: Four times a day (QID) | ORAL | Status: DC | PRN

## 2012-11-15 MED ORDER — PROMETHAZINE-PHENYLEPHRINE 6.25-5 MG/5ML PO SYRP: 5.0000 mL | ORAL_SOLUTION | ORAL | Status: DC | PRN

## 2012-11-15 NOTE — Telephone Encounter (Signed)
Called pharmacy spoke with Judeth Cornfield she states promethazine DM, promethazine codeine, but if md trying to avoid codeine promethazine DM would be best,,,/lmb

## 2012-11-15 NOTE — Telephone Encounter (Signed)
Please call pharmacy - what alternative do they have in stock? thanks

## 2012-11-15 NOTE — Telephone Encounter (Signed)
Received fax stating promethazine cough syrup is unavailable. Requesting md to change to something else...lmb

## 2012-11-15 NOTE — Patient Instructions (Signed)
It was good to see you today. Continue amoxicillin antibiotics  prescription cough syrup, pred taper x 6 days and Albuterol nebulizer as dicsused - Your prescription(s) have been submitted to your pharmacy. Please take as directed and contact our office if you believe you are having problem(s) with the medication(s). Alternate between ibuprofen and tylenol for aches, pain and fever symptoms as discussed Hydrate, rest and call if worse or unimproved

## 2012-11-15 NOTE — Progress Notes (Signed)
  Subjective:    HPI  complains of cough and cold symptoms  Onset >2 week ago, wax/wane symptoms, but worse at this time  Initially associated with rhinorrhea, sneezing, sore throat, mild headache and low grade fever Now productive cough with mild-mod chest congestion, yellow - also wheeze, worse at night No relief with OTC meds Precipitated by sick contacts -infant dtr with same symptoms   Past Medical History  Diagnosis Date  . Vaginal yeast infection     recurrent  . UTI (urinary tract infection)     recurrent  . GERD (gastroesophageal reflux disease)   . Obesity     Review of Systems Constitutional: No unexpected weight change Pulmonary: No pleurisy or hemoptysis Cardiovascular: No chest pain or palpitations     Objective:   Physical Exam BP 112/82  Pulse 72  Temp 98.1 F (36.7 C) (Oral)  Ht 5\' 4"  (1.626 m)  Wt 256 lb (116.121 kg)  BMI 43.94 kg/m2  SpO2 98% GEN: nontoxic appearing and audible head/chest congestion HENT: NCAT, mild sinus tenderness bilaterally, nares with clear discharge, oropharynx mild erythema, no exudate Eyes: Vision grossly intact, no conjunctivitis Lungs: exp wheeze on L side, scattered rhonchi, no wheeze, no increased work of breathing Cardiovascular: Regular rate and rhythm, no bilateral edema  Lab Results  Component Value Date   WBC 9.7 04/30/2012   HGB 11.7* 04/30/2012   HCT 34.8* 04/30/2012   PLT 226.0 04/30/2012   GLUCOSE 90 04/30/2012   CHOL 178 04/30/2012   TRIG 96.0 04/30/2012   HDL 58.90 04/30/2012   LDLCALC 100* 04/30/2012   ALT 11 04/30/2012   AST 19 04/30/2012   NA 137 04/30/2012   K 4.1 04/30/2012   CL 103 04/30/2012   CREATININE 0.6 04/30/2012   BUN 12 04/30/2012   CO2 25 04/30/2012   TSH 2.27 04/30/2012       Assessment & Plan:  Viral URI >progression to acute bronchitis with acute bronchospasm Cough, postnasal drip related to above   Explained lack of efficacy for antibiotics in viral disease - but continue amox as rx'd  for tooth infection Prescription cough suppression- prometh VC - and pred taper, add albuterol neb - new prescriptions done, reviewed safety with lactation status Symptomatic care with Tylenol or Advil, hydration and rest -  salt gargle advised as needed

## 2012-11-15 NOTE — Telephone Encounter (Signed)
Ok - change to prometh DM - erx done

## 2012-11-22 ENCOUNTER — Ambulatory Visit (INDEPENDENT_AMBULATORY_CARE_PROVIDER_SITE_OTHER): Payer: 59 | Admitting: Internal Medicine

## 2012-11-22 ENCOUNTER — Encounter: Payer: Self-pay | Admitting: Internal Medicine

## 2012-11-22 ENCOUNTER — Telehealth: Payer: Self-pay | Admitting: Internal Medicine

## 2012-11-22 VITALS — BP 116/80 | HR 89 | Temp 98.3°F | Resp 12 | Wt 254.1 lb

## 2012-11-22 DIAGNOSIS — F411 Generalized anxiety disorder: Secondary | ICD-10-CM

## 2012-11-22 DIAGNOSIS — F419 Anxiety disorder, unspecified: Secondary | ICD-10-CM

## 2012-11-22 DIAGNOSIS — F410 Panic disorder [episodic paroxysmal anxiety] without agoraphobia: Secondary | ICD-10-CM

## 2012-11-22 NOTE — Telephone Encounter (Signed)
Patient Information:  Caller Name: Kasiah  Phone: [phone]  Patient: Marque, Bango  Gender: Female  DOB: Oct 28, 1981  Age: 32 Years  PCP: Rene Paci (Adults only)  Pregnant: No  Office Follow Up:  Does the office need to follow up with this patient?: No  Instructions For The Office: N/A  RN Note:  Scheduled office visit vs ED due to office being open.  Symptoms  Reason For Call & Symptoms: Has been having dizziness, weakness, mild chest pain. Reports she woke up this morning feeling achy, but it has gotten worse throughout the day. Stopped a Prednisone patch.  Reviewed Health History In EMR: Yes  Reviewed Medications In EMR: Yes  Reviewed Allergies In EMR: Yes  Reviewed Surgeries / Procedures: Yes  Date of Onset of Symptoms: 11/22/2012  Treatments Tried: Ibuprofen for pain  Treatments Tried Worked: Yes OB / GYN:  LMP: 11/07/2012  Guideline(s) Used:  Dizziness  Disposition Per Guideline:   Go to ED Now (or to Office with PCP Approval)  Reason For Disposition Reached:   Severe dizziness (e.g., unable to stand, requires support to walk, feels like passing out now)  Advice Given:  Call Back If:  You become worse.  Appointment Scheduled:  11/22/2012 15:30:00 Appointment Scheduled Provider:  Rene Paci (Adults only)

## 2012-11-22 NOTE — Patient Instructions (Addendum)
It was good to see you today. Your symptoms appear related to panic attack: dizziness, palpitations, heaviness sensation, etc See information below about panic attack symptoms and let me know if these are becoming more frequent or uncontrollable to consider medications as needed Anxiety and Panic Attacks Your caregiver has informed you that you are having an anxiety or panic attack. There may be many forms of this. Most of the time these attacks come suddenly and without warning. They come at any time of day, including periods of sleep, and at any time of life. They may be strong and unexplained. Although panic attacks are very scary, they are physically harmless. Sometimes the cause of your anxiety is not known. Anxiety is a protective mechanism of the body in its fight or flight mechanism. Most of these perceived danger situations are actually nonphysical situations (such as anxiety over losing a job). CAUSES   The causes of an anxiety or panic attack are many. Panic attacks may occur in otherwise healthy people given a certain set of circumstances. There may be a genetic cause for panic attacks. Some medications may also have anxiety as a side effect. SYMPTOMS   Some of the most common feelings are:  Intense terror.   Dizziness, feeling faint.   Hot and cold flashes.   Fear of going crazy.   Feelings that nothing is real.   Sweating.   Shaking.   Chest pain or a fast heartbeat (palpitations).   Smothering, choking sensations.   Feelings of impending doom and that death is near.   Tingling of extremities, this may be from over-breathing.   Altered reality (derealization).   Being detached from yourself (depersonalization).  Several symptoms can be present to make up anxiety or panic attacks. DIAGNOSIS   The evaluation by your caregiver will depend on the type of symptoms you are experiencing. The diagnosis of anxiety or panic attack is made when no physical illness can be  determined to be a cause of the symptoms. TREATMENT   Treatment to prevent anxiety and panic attacks may include:  Avoidance of circumstances that cause anxiety.   Reassurance and relaxation.   Regular exercise.   Relaxation therapies, such as yoga.   Psychotherapy with a psychiatrist or therapist.   Avoidance of caffeine, alcohol and illegal drugs.   Prescribed medication.  SEEK IMMEDIATE MEDICAL CARE IF:    You experience panic attack symptoms that are different than your usual symptoms.   You have any worsening or concerning symptoms.  Document Released: 10/23/2005 Document Revised: 01/15/2012 Document Reviewed: 02/24/2010 [facility] Patient Information 2013 [place], [place].

## 2012-11-22 NOTE — Progress Notes (Signed)
  Subjective:    Patient ID: Julie Mcneil, female    DOB: [birth year], 32 y.o.   MRN: [id number]  HPI  complains of anxiety and panic attack symptoms - see below Onset this AM, resolved within few hours upon spouse arriving home Feels normal now - embarrassed now for "acting crazy" pred course completed yesterday - prior resp symptoms have resolved Single diarrhea episode last PM, no recurrence - no nausea and vomiting, no fever   Past Medical History  Diagnosis Date  . Vaginal yeast infection     recurrent  . UTI (urinary tract infection)     recurrent  . GERD (gastroesophageal reflux disease)   . Obesity    Review of Systems  Constitutional: Negative for fever, diaphoresis, activity change, fatigue and unexpected weight change.  Respiratory: Positive for chest tightness (resolved) and shortness of breath (resolved).   Cardiovascular: Positive for palpitations (resolved).  Musculoskeletal: Positive for myalgias.  Neurological: Positive for dizziness (resolved), light-headedness and headaches (mild). Negative for seizures.       Objective:   Physical Exam BP 116/80  Pulse 89  Temp 98.3 F (36.8 C) (Oral)  Resp 12  Wt 254 lb 1.3 oz (115.25 kg)  SpO2 97%  LMP 10/22/2012  Breastfeeding? Yes Wt Readings from Last 3 Encounters:  11/22/12 254 lb 1.3 oz (115.25 kg)  11/15/12 256 lb (116.121 kg)  09/16/12 249 lb (112.946 kg)   Constitutional: She is obese, appears well-developed and well-nourished. No distress.  Cardiovascular: Normal rate, regular rhythm and normal heart sounds.  No murmur heard. No BLE edema. Pulmonary/Chest: Effort normal and breath sounds normal. No respiratory distress. She has no wheezes.  Neurological: She is alert and oriented to person, place, and time. No cranial nerve deficit. Coordination normal.  Skin: Skin is warm and dry. No rash noted. No erythema.  Neuro: CN 2-12 sym intact - speech fluent, no drift, neg romberg - gait and balance  normal Psychiatric: She has a slightly anxious mood and affect. Her behavior is normal. Judgment and thought content normal.   Lab Results  Component Value Date   WBC 9.7 04/30/2012   HGB 11.7* 04/30/2012   HCT 34.8* 04/30/2012   PLT 226.0 04/30/2012   GLUCOSE 90 04/30/2012   CHOL 178 04/30/2012   TRIG 96.0 04/30/2012   HDL 58.90 04/30/2012   LDLCALC 100* 04/30/2012   ALT 11 04/30/2012   AST 19 04/30/2012   NA 137 04/30/2012   K 4.1 04/30/2012   CL 103 04/30/2012   CREATININE 0.6 04/30/2012   BUN 12 04/30/2012   CO2 25 04/30/2012   TSH 2.27 04/30/2012       Assessment & Plan:   Anxiety with panic attack symptoms - likely exacerbated by recent pred use Hx same, generally self controlled, no hx meds Education on symptoms and reassurance provided - no indication for meds at this time Pt understands and will call if worse or recurrent symptoms

## 2012-12-21 ENCOUNTER — Ambulatory Visit (INDEPENDENT_AMBULATORY_CARE_PROVIDER_SITE_OTHER): Payer: 59 | Admitting: Internal Medicine

## 2012-12-21 VITALS — BP 110/78 | HR 77 | Temp 98.3°F | Wt 256.0 lb

## 2012-12-21 DIAGNOSIS — J209 Acute bronchitis, unspecified: Secondary | ICD-10-CM

## 2012-12-21 DIAGNOSIS — J329 Chronic sinusitis, unspecified: Secondary | ICD-10-CM

## 2012-12-21 MED ORDER — CEFUROXIME AXETIL 500 MG PO TABS: 500.0000 mg | ORAL_TABLET | Freq: Two times a day (BID) | ORAL | Status: DC

## 2012-12-21 NOTE — Progress Notes (Signed)
  Subjective:    Patient ID: Julie Mcneil, female    DOB: 1980-12-08, 32 y.o.   MRN: [id number]  HPI  C/o URI sx's x 3 wks: sinus congestion and green d/c; coughing more x 1 wk. She is still breast-feeding a little...  Review of Systems  Constitutional: Positive for chills. Negative for fever and fatigue.  HENT: Positive for congestion, sore throat, rhinorrhea, postnasal drip and sinus pressure. Negative for ear discharge.   Respiratory: Positive for cough. Negative for shortness of breath.        Objective:   Physical Exam  Constitutional: No distress.  HENT:  eryth throat; B wax  Eyes: Pupils are equal, round, and reactive to light. No scleral icterus.  Cardiovascular: Normal rate.   No murmur heard. Pulmonary/Chest: No respiratory distress. She has no wheezes. She has no rales.  Abdominal: She exhibits no mass.  Lymphadenopathy:    She has no cervical adenopathy.  Psychiatric: She has a normal mood and affect.          Assessment & Plan:

## 2012-12-21 NOTE — Assessment & Plan Note (Signed)
Ceftin x10 d 

## 2012-12-23 ENCOUNTER — Telehealth: Payer: Self-pay | Admitting: Internal Medicine

## 2012-12-23 ENCOUNTER — Telehealth: Payer: Self-pay

## 2012-12-23 MED ORDER — CEFUROXIME AXETIL 250 MG/5ML PO SUSR: 250.0000 mg | Freq: Two times a day (BID) | ORAL | Status: AC

## 2012-12-23 NOTE — Telephone Encounter (Signed)
done

## 2012-12-23 NOTE — Telephone Encounter (Signed)
Noted. Thx.

## 2012-12-23 NOTE — Telephone Encounter (Signed)
Patient called [facility] stating that she was seen Saturday and given rx for abx pill. She is requesting that this be sent in liquid form. Thanks

## 2012-12-23 NOTE — Telephone Encounter (Signed)
[facility] Triage Call Report Triage Record Num: [id number] Operator: Hyman Bower Patient Name: Zenaida Niece Call Date & Time: 12/20/2012 11:16:33AM Patient Phone: [phone] PCP: Rene Paci Patient Gender: Female PCP Fax : [phone] Patient DOB: January 06, 1981 [facility] Name: [facility] Reason for Call: Caller: Awilda/Patient; PCP: Rene Paci (Adults only); CB#: [phone]; Call regarding Follow Up for Bronchitis; Patient was diagnosed with bronchitis back in Jan 2014. Patient reports that she is starting to have symptoms again. Reports productive cough with dark green mucus, small amount of sinus drainage. Afebrile. Reports she feels short of breath when coughing. Patient has done an Albuterol treatment last night. Triaged per Upper respiratory infection guideline, disposition: see provider within 24 hours for "Pressure/pain above or below eyes, near ears over sinuses and yellow-green drainage from nose or down back of throat." Appointment scheduled 12/21/12 at 10:00am with Dr. Posey Rea. Care advice given per guideline. Advised patient to call back if she starts having any trouble breathing or any new symptoms develop. Also advised patient to ensure office is opening on 12/21/12 before coming to the appointment. Patient verbalized understanding. Protocol(s) Used: Upper Respiratory Infection (URI) Recommended Outcome per Protocol: See Provider within 24 hours Reason for Outcome: Pressure/pain above or below eyes, near ears over sinuses (may also be described as fullness, worsens when bending forward, teeth or eye pain) AND yellow-green drainage from nose or down back of throat. Care Advice: A warm, moist compress placed on face, over eyes for 15 to 20 minutes, 5 to 6 times a day, may help relieve the congestion.

## 2012-12-24 NOTE — Telephone Encounter (Signed)
OV to review symptoms and meds -thanks

## 2012-12-24 NOTE — Telephone Encounter (Signed)
Notified pt back md sent in liquid form. Pt also want to let md know that her anxiety issues is not better. Having them almost everyday. She states some are worse than others. Pls advise...Raechel Chute

## 2012-12-24 NOTE — Telephone Encounter (Signed)
Noitfied pt with md response. Wasn't able to make appt will call bck to schedule...Raechel Chute

## 2013-01-24 ENCOUNTER — Ambulatory Visit: Payer: Self-pay | Admitting: Women's Health

## 2013-01-27 ENCOUNTER — Ambulatory Visit: Payer: Self-pay | Admitting: Women's Health

## 2013-03-13 ENCOUNTER — Ambulatory Visit: Payer: 59 | Admitting: Women's Health

## 2013-03-24 ENCOUNTER — Ambulatory Visit (INDEPENDENT_AMBULATORY_CARE_PROVIDER_SITE_OTHER): Payer: 59 | Admitting: Women's Health

## 2013-03-24 ENCOUNTER — Encounter: Payer: Self-pay | Admitting: Women's Health

## 2013-03-24 ENCOUNTER — Other Ambulatory Visit (HOSPITAL_COMMUNITY)
Admission: RE | Admit: 2013-03-24 | Discharge: 2013-03-24 | Disposition: A | Discharge status: Home / Self Care | Payer: 59 | Source: Ambulatory Visit | Attending: Women's Health | Admitting: Women's Health

## 2013-03-24 VITALS — BP 124/76 | Ht 64.0 in | Wt 226.0 lb

## 2013-03-24 DIAGNOSIS — Z01419 Encounter for gynecological examination (general) (routine) without abnormal findings: Secondary | ICD-10-CM | POA: Insufficient documentation

## 2013-03-24 DIAGNOSIS — J329 Chronic sinusitis, unspecified: Secondary | ICD-10-CM

## 2013-03-24 MED ORDER — AZITHROMYCIN 250 MG PO TABS: ORAL_TABLET | ORAL | Status: DC

## 2013-03-24 NOTE — Progress Notes (Signed)
BARBARAANN AVANS 1980/11/10 [id number]    History:    The patient presents for annual exam.   Cycles every 4-6 weeks for 5-6 days/BTL.  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 Has been having green nasal drainage with facial pressure. Normal Pap history. Labs at primary care. Received 2 gardasil in 2009.   Past medical history, past surgical history, family history and social history were all reviewed and documented in the EPIC chart. 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 Marissa 7, Rolly Salter almost 3, Mackenzie 1.    ROS:  A  ROS was performed and pertinent positives and negatives are included in the history.  Exam:  Filed Vitals:   03/24/13 1013  BP: 124/76    General appearance:  Normal Head/Neck:  Normal, without cervical or supraclavicular adenopathy. Thyroid:  Symmetrical, normal in size, without palpable masses or nodularity. Respiratory  Effort:  Normal  Auscultation:  Clear without wheezing or rhonchi Cardiovascular  Auscultation:  Regular rate, without rubs, murmurs or gallops  Edema/varicosities:  Not grossly evident Abdominal  Soft,nontender, without masses, guarding or rebound.  Liver/spleen:  No organomegaly noted  Hernia:  None appreciated  Skin  Inspection:  Grossly normal  Palpation:  Grossly normal Neurologic/psychiatric  Orientation:  Normal with appropriate conversation.  Mood/affect:  Normal  Genitourinary    Breasts: Examined lying and sitting.     Right: Without masses, retractions, discharge or axillary adenopathy.     Left: Without masses, retractions, discharge or axillary adenopathy.   Inguinal/mons:  Normal without inguinal adenopathy  External genitalia:  Normal  BUS/Urethra/Skene's glands:  Normal  Bladder:  Normal  Vagina:  Normal  Cervix:  Normal  Uterus:   normal in size, shape and contour.  Midline and  mobile  Adnexa/parametria:     Rt: Without masses or tenderness.   Lt: Without masses or tenderness.  Anus and perineum: Normal  Digital rectal exam: Normal sphincter tone without palpated masses or tenderness  Assessment/Plan:  32 y.o. MWF G3P3 for annual exam.     Situational stressors Obesity Cycles every 4-6 weeks/BTL Labs at primary care Sinus infection  Plan: Z-Pak, instructed to call if no relief of nasal drainage and sinus pressure. Instructed to call if cycles space greater than every 2 months. Pap, new Pap screening guidelines reviewed. SBE's, increase regular exercise and decrease calories for weight loss, calcium rich diet, vitamin D 1000 daily encouraged. Reviewed importance of delegating home responsibilities, taking time for self, denies need for counseling.    Harrington Challenger WHNP, 2:00 PM 03/24/2013

## 2013-03-24 NOTE — Patient Instructions (Addendum)

## 2013-03-25 ENCOUNTER — Encounter: Payer: Self-pay | Admitting: Internal Medicine

## 2013-03-25 ENCOUNTER — Ambulatory Visit (INDEPENDENT_AMBULATORY_CARE_PROVIDER_SITE_OTHER): Payer: 59 | Admitting: Internal Medicine

## 2013-03-25 VITALS — BP 110/68 | HR 67 | Temp 98.4°F | Wt 259.8 lb

## 2013-03-25 DIAGNOSIS — R42 Dizziness and giddiness: Secondary | ICD-10-CM

## 2013-03-25 DIAGNOSIS — J0100 Acute maxillary sinusitis, unspecified: Secondary | ICD-10-CM

## 2013-03-25 DIAGNOSIS — F411 Generalized anxiety disorder: Secondary | ICD-10-CM

## 2013-03-25 LAB — URINALYSIS W MICROSCOPIC + REFLEX CULTURE
Bilirubin Urine: NEGATIVE
Casts: NONE SEEN
Crystals: NONE SEEN
Glucose, UA: NEGATIVE mg/dL
Ketones, ur: NEGATIVE mg/dL
Leukocytes, UA: NEGATIVE
Nitrite: POSITIVE — AB
Protein, ur: NEGATIVE mg/dL
Specific Gravity, Urine: 1.024 (ref 1.005–1.030)
Urobilinogen, UA: 0.2 mg/dL (ref 0.0–1.0)
pH: 5.5 (ref 5.0–8.0)

## 2013-03-25 MED ORDER — MECLIZINE HCL 25 MG PO TABS: 25.0000 mg | ORAL_TABLET | Freq: Three times a day (TID) | ORAL | Status: DC | PRN

## 2013-03-25 MED ORDER — LEVOFLOXACIN 500 MG PO TABS: 500.0000 mg | ORAL_TABLET | Freq: Every day | ORAL | Status: DC

## 2013-03-25 MED ORDER — FLUTICASONE PROPIONATE 50 MCG/ACT NA SUSP: 2.0000 | Freq: Every day | NASAL | Status: DC

## 2013-03-25 NOTE — Patient Instructions (Signed)
It was good to see you today. We have reviewed your prior records including labs and tests today Stop Z-Pak Start Levaquin once daily for 10 days to treat sinus infection Start Flonase spray every day to help sinus inflammation and swelling Use meclizine as needed for dizziness nausea symptoms Your prescription(s) have been submitted to your pharmacy. Please take as directed and contact our office if you believe you are having problem(s) with the medication(s). we'll make referral for CT of sinuses to evaluate the infection. Our office will contact you regarding appointment(s) once made. If symptoms unimproved in next 2 weeks, call for reevaluation, call sooner if worse

## 2013-03-25 NOTE — Progress Notes (Signed)
Subjective:    Patient ID: Julie Mcneil, female    DOB: 03-Apr-1981, 32 y.o.   MRN: [id number]  HPI  Complains of dizziness Symptoms are intermittent but present daily for past 5 days Not exacerbated by position or activity - feels "disconnected and lightheaded" Associated with sinus infection, began on Z-Pak yesterday by gynecology for same Also associated with mild headache, left-sided face swelling, nausea without vomiting or abdominal pain Mild increase in acid reflux, treated with Zantac exam and improved Denies chest pain or palpitations -No syncope or near-syncope     Past Medical History  Diagnosis Date  . Vaginal yeast infection     recurrent  . UTI (urinary tract infection)     recurrent  . GERD (gastroesophageal reflux disease)   . Obesity   . Anxiety     Panic attacks  . Cervical dysplasia    Review of Systems  Constitutional: Negative for fever, diaphoresis, activity change, fatigue and unexpected weight change.  HENT: Positive for sinus pressure. Negative for postnasal drip.   Respiratory: Negative for cough and shortness of breath.   Cardiovascular: Negative for chest pain, palpitations and leg swelling.  Gastrointestinal: Positive for nausea. Negative for diarrhea, constipation and abdominal distention.  Neurological: Positive for dizziness and light-headedness. Negative for tremors, seizures, speech difficulty, weakness, numbness and headaches.  Psychiatric/Behavioral: Negative for confusion, sleep disturbance and decreased concentration. The patient is nervous/anxious.        Objective:   Physical Exam  BP 110/68  Pulse 67  Temp(Src) 98.4 F (36.9 C) (Oral)  Wt 259 lb 12.8 oz (117.845 kg)  BMI 44.57 kg/m2  SpO2 98%  LMP 03/13/2013 Wt Readings from Last 3 Encounters:  03/25/13 259 lb 12.8 oz (117.845 kg)  03/24/13 226 lb (102.513 kg)  12/21/12 256 lb (116.121 kg)   Constitutional: She is obese, appears well-developed and well-nourished. No  distress.  HENT: left sided facial swelling of her maxillary region. Tender to palpation over left maxillary sinus. Nose: significant turbinate swelling on left without evidence of purulent discharge. No evidence of polyps. Oropharynx clear without exudate or erythema. Bilateral tympanic membranes obscured with soft cerumen, not removed today because of dizziness Cardiovascular: Normal rate, regular rhythm and normal heart sounds.  No murmur heard. No BLE edema. Pulmonary/Chest: Effort normal and breath sounds normal. No respiratory distress. She has no wheezes.  Neurological: She is alert and oriented to person, place, and time. No cranial nerve deficit. Coordination normal.  Skin: Skin is warm and dry. No rash noted. No erythema.  Neuro: CN 2-12 sym intact - speech fluent, no drift, neg romberg - gait and balance normal Psychiatric: She has a slightly anxious mood and affect. Her behavior is normal. Judgment and thought content normal.   Lab Results  Component Value Date   WBC 9.7 04/30/2012   HGB 11.7* 04/30/2012   HCT 34.8* 04/30/2012   PLT 226.0 04/30/2012   GLUCOSE 90 04/30/2012   CHOL 178 04/30/2012   TRIG 96.0 04/30/2012   HDL 58.90 04/30/2012   LDLCALC 100* 04/30/2012   ALT 11 04/30/2012   AST 19 04/30/2012   NA 137 04/30/2012   K 4.1 04/30/2012   CL 103 04/30/2012   CREATININE 0.6 04/30/2012   BUN 12 04/30/2012   CO2 25 04/30/2012   TSH 2.27 04/30/2012       Assessment & Plan:   Dizziness - neuro and cardiac exam benign L maxillary sinusitis with facial swelling - recurrent with hx same <38mo ago (  12/2012) Hx panic attacks/anxiety - exacerbated by acute illness  Change Zpak to Levaquin Add nasal steroid Meclizine prn CT sinus Reassurance - to call if worse or unimproved

## 2013-03-26 LAB — URINE CULTURE: Colony Count: >=100000

## 2013-03-27 ENCOUNTER — Ambulatory Visit (INDEPENDENT_AMBULATORY_CARE_PROVIDER_SITE_OTHER)
Admission: RE | Admit: 2013-03-27 | Discharge: 2013-03-27 | Disposition: A | Discharge status: Home / Self Care | Payer: 59 | Source: Ambulatory Visit

## 2013-03-27 ENCOUNTER — Other Ambulatory Visit: Payer: 59

## 2013-03-27 DIAGNOSIS — R42 Dizziness and giddiness: Secondary | ICD-10-CM

## 2013-03-27 DIAGNOSIS — J0100 Acute maxillary sinusitis, unspecified: Secondary | ICD-10-CM

## 2013-03-28 ENCOUNTER — Telehealth: Payer: Self-pay

## 2013-03-28 ENCOUNTER — Other Ambulatory Visit: Payer: Self-pay | Admitting: Women's Health

## 2013-03-28 DIAGNOSIS — N390 Urinary tract infection, site not specified: Secondary | ICD-10-CM

## 2013-03-28 NOTE — Telephone Encounter (Signed)
Please call and tell her she does not need to take Cipro at all, Levaquin will treat both. Check test of cure UA in 2 weeks. Hope she is feeling better.

## 2013-03-28 NOTE — Telephone Encounter (Signed)
Ct showed evidence of sinus infection. Continue Levaquin given at office visit

## 2013-03-28 NOTE — Telephone Encounter (Signed)
Phone call from patient requesting her CT results from yesterday.

## 2013-03-28 NOTE — Telephone Encounter (Signed)
Patient informed. She asked me to check with [place] because yesterday PCP had her stop the Z-pac she was on for sinus infection and start on Levaquin 500mg  #10 one daily.  She wanted to know if she should wait and take the Cipro you prescribed once she finishes this or okay to take together.

## 2013-03-28 NOTE — Telephone Encounter (Signed)
Pt notified of results and to make sure she continues the Levaquin. She states she is still dizzy.

## 2013-03-28 NOTE — Telephone Encounter (Signed)
Patient informed.  Order put in system and patient will call for lab appt.

## 2013-03-28 NOTE — Telephone Encounter (Signed)
Message copied by Keenan Bachelor on Fri Mar 28, 2013 12:17 PM ------      Message from: [place], [place] J      Created: Fri Mar 28, 2013  7:32 AM       Please call and informed urine culture positive for infection, Cipro 500 twice daily for 3 days #6 check test of cure in 2 weeks. I don't think she had UTI symptoms at office visit. ------

## 2013-04-11 ENCOUNTER — Telehealth: Payer: Self-pay | Admitting: *Deleted

## 2013-04-11 MED ORDER — PRENATE MINI 29-0.6-0.4-350 MG PO CAPS: 350.0000 mg | ORAL_CAPSULE | Freq: Every day | ORAL | Status: DC

## 2013-04-11 NOTE — Telephone Encounter (Signed)
Okay, please call in for patient.

## 2013-04-11 NOTE — Telephone Encounter (Signed)
Okay to try Prenate mini. May be cheaper to use a Prenatal chewable gummy, they tend to be well tolerated.

## 2013-04-11 NOTE — Telephone Encounter (Signed)
Pt called to see if you would be willing to give Rx for PNV-DHA ACELLA soft gel prenatal vit. Pt is still breast feeding her youngest child and these are the only pill that don't make pt sick. Call back # if needed [phone]. Please advise

## 2013-04-11 NOTE — Telephone Encounter (Signed)
I tried to find rx in system and called pharmacy for this Rx and had problems with finding this vit. Spoke with patient and she is fine with any prenatal vit. That has DHA in it but would like it to be a soft gel. Soft gel doesn't make her sick.  She asked if you know of any soft gel rx that she could take?

## 2013-04-11 NOTE — Telephone Encounter (Signed)
RX SENT

## 2013-06-06 ENCOUNTER — Telehealth: Payer: Self-pay | Admitting: *Deleted

## 2013-06-06 DIAGNOSIS — Z0000 Encounter for general adult medical examination without abnormal findings: Secondary | ICD-10-CM

## 2013-06-06 NOTE — Telephone Encounter (Signed)
Entered labs...lmb

## 2013-06-06 NOTE — Telephone Encounter (Signed)
Message copied by Deatra James on Fri Jun 06, 2013  4:25 PM ------      Message from: Etheleen Sia      Created: Thu Jun 05, 2013  4:34 PM      Regarding: LABS       LABS FOR OCT PHYSICAL ------

## 2013-06-13 ENCOUNTER — Ambulatory Visit: Payer: 59 | Admitting: Family Medicine

## 2013-06-13 ENCOUNTER — Encounter: Payer: Self-pay | Admitting: Family Medicine

## 2013-06-13 ENCOUNTER — Ambulatory Visit (INDEPENDENT_AMBULATORY_CARE_PROVIDER_SITE_OTHER)
Admission: RE | Admit: 2013-06-13 | Discharge: 2013-06-13 | Disposition: A | Discharge status: Home / Self Care | Payer: 59 | Source: Ambulatory Visit | Attending: Family Medicine | Admitting: Family Medicine

## 2013-06-13 ENCOUNTER — Ambulatory Visit (INDEPENDENT_AMBULATORY_CARE_PROVIDER_SITE_OTHER): Payer: 59 | Admitting: Family Medicine

## 2013-06-13 VITALS — BP 132/84 | HR 93 | Wt 260.0 lb

## 2013-06-13 DIAGNOSIS — M25562 Pain in left knee: Secondary | ICD-10-CM

## 2013-06-13 DIAGNOSIS — M25569 Pain in unspecified knee: Secondary | ICD-10-CM

## 2013-06-13 MED ORDER — MELOXICAM 15 MG PO TABS: 15.0000 mg | ORAL_TABLET | Freq: Every day | ORAL | Status: DC

## 2013-06-13 NOTE — Assessment & Plan Note (Addendum)
Patient clinically does not have any signs of instability of the left knee. There is no signs of deep venous thrombosis so further imaging such as Doppler is not necessary. Patient does have ultrasound findings that does show that she has a small effusion with what appears to be coagulated blood. This likely is resolving and will continue to improve. Home exercise program given, meloxicam for pain relief, icing protocol The discussed aspiration as well as injection of Kenalog which patient declined today. Aspiration would help if there was any blood present for diagnosis. We'll discuss this further at followup if still concerning. Patient was given a brace today and fitted properly X-rays ordered to rule out mass of distal femur but this is highly unlikely Followup in 3 weeks for further evaluation if not better.

## 2013-06-13 NOTE — Progress Notes (Signed)
CC: Left knee pain and swelling   HPI: Patient is a very pleasant 32 year old female coming in with left knee pain and swelling. Patient states that it did start approximately 5 days ago. Patient does not remember any true injury and has never injured the knee previously. Patient states the pain is mostly on the anterior aspect of the knee as well as femur and somewhat travels down to the feet. Patient states that it seemed that it was numb for some time but now is improving. Patient has been able to ambulate but finds it difficult to get down to take care of her 3 young children. Patient is unable to squat secondary to pain. Patient did move recently over the course of the last 2 weeks and did do some lifting out of the ordinary but does not remember any injury or bruising. Patient also denies any fevers, chills, any abnormal weight loss or any bruising of the leg. Patient describes the pain more of a dull aching sensation. Patient has tried ibuprofen which does take the pain away but would like to not take a medicine a regular basis. Patient describes the pain as a 4-6/10. No nighttime awakening.  Past medical, surgical, family and social history reviewed. Medications reviewed all in the electronic medical record.   Review of Systems: No headache, visual changes, nausea, vomiting, diarrhea, constipation, dizziness, abdominal pain, skin rash, fevers, chills, night sweats, weight loss, swollen lymph nodes, body aches, chest pain, shortness of breath, mood changes. Positive muscle soreness in joint ache.  Objective:    Blood pressure 132/84, pulse 93, weight 260 lb (117.935 kg), SpO2 98.00%.   General: No apparent distress alert and oriented x3 mood and affect normal, dressed appropriately.  HEENT: Pupils equal, extraocular movements intact Respiratory: Patient's speak in full sentences and does not appear short of breath Cardiovascular: No lower extremity edema, non tender, no erythema Skin:  Warm dry intact with no signs of infection or rash on extremities or on axial skeleton. Abdomen: Soft nontender Neuro: Cranial nerves II through XII are intact, neurovascularly intact in all extremities with 2+ DTRs and 2+ pulses. Lymph: No lymphadenopathy of posterior or anterior cervical chain or axillae bilaterally.  Gait normal with good balance and coordination.  MSK: Non tender with full range of motion and good stability and symmetric strength and tone of shoulders, elbows, wrist, hip, and ankles bilaterally.  Knee: Left On inspection patient does have trace effusion. Palpation normal with no warmth, joint line tenderness, patellar tenderness, or condyle tenderness. Patient is somewhat tender to palpation just superior to the patella. ROM full in flexion and extension and lower leg rotation. Ligaments with solid consistent endpoints including ACL, PCL, LCL, MCL. Negative Mcmurray's, Apley's, and Thessalonian tests. Mild painful patellar compression. Patellar glide without crepitus. Patellar unremarkable with mild weakness of the quadriceps tendon on the left compared to the contralateral side Hamstring and quadriceps strength is normal.  Right knee has no abnormality within normal exam.  MSK US performed of: Left knee This study was ordered, performed, and interpreted by Terrilee Files D.O.  Knee: All structures visualized. Anteromedial, anterolateral, posteromedial, and posterolateral menisci unremarkable without tearing, fraying or displacement. Patellar Tendon unremarkable on long and transverse views without effusion. Quadricep tendon does have some mild bursitis to it. Patient does have a suprapatellar effusion but does have what appears to be coagulated blood. There is mild neovascularization of the tendon itself. This hyperechoic area that is likely coagulated blood does not appear to be  part of the bone. We'll get x-ray to rule out bone mass. This is not a vascular structure. No  abnormality of prepatellar bursa. LCL and MCL unremarkable on long and transverse views. No abnormality of origin of medial or lateral head of the gastrocnemius.  IMPRESSION:  Knee effusion with normal meniscus and likely patellar strain in resolving contusion of the knee. Questionable for resolving hemarthrosis.   Impression and Recommendations:    Left anterior knee pain Patient clinically does not have any signs of instability of the left knee. There is no signs of deep venous thrombosis so further imaging such as Doppler is not necessary. Patient does have ultrasound findings that does show that she has a small effusion with what appears to be coagulated blood. This likely is resolving and will continue to improve. Home exercise program given, meloxicam for pain relief, icing protocol Patient was given a brace today and fitted properly X-rays ordered to rule out mass of distal femur but this is highly unlikely Followup in 3 weeks for further evaluation if not better.    This case required medical decision making of moderate complexity.

## 2013-06-13 NOTE — Patient Instructions (Addendum)
Try the exercises that I am giving you daily.  Avoid deep squats if possible Meloxicam daily for 10 days then as needed. Stop if it hurts your stomach.  Ice 3 times daily Wear brace No new is good news on xray Come back in 3 weeks.

## 2013-06-16 ENCOUNTER — Telehealth: Payer: Self-pay | Admitting: *Deleted

## 2013-06-16 NOTE — Telephone Encounter (Signed)
Pt left msg stating that last Friday she was given a knee brace & she states that she has it as tight as she can get it & the brace is continuing to slide down her leg. Please advise.

## 2013-06-16 NOTE — Telephone Encounter (Signed)
Please tell her she can actually tighten the other side.  Both sides have velcro.  Have her try that.If she is still having trouble have her come in and we will see if she needs a different size.

## 2013-06-16 NOTE — Telephone Encounter (Signed)
Discussed with pt, she is going to come back in to get fitted for a different brace.

## 2013-06-18 ENCOUNTER — Ambulatory Visit (INDEPENDENT_AMBULATORY_CARE_PROVIDER_SITE_OTHER): Payer: 59 | Admitting: Gynecology

## 2013-06-18 ENCOUNTER — Encounter: Payer: Self-pay | Admitting: Gynecology

## 2013-06-18 DIAGNOSIS — IMO0001 Reserved for inherently not codable concepts without codable children: Secondary | ICD-10-CM

## 2013-06-18 DIAGNOSIS — N898 Other specified noninflammatory disorders of vagina: Secondary | ICD-10-CM

## 2013-06-18 DIAGNOSIS — R21 Rash and other nonspecific skin eruption: Secondary | ICD-10-CM

## 2013-06-18 DIAGNOSIS — R350 Frequency of micturition: Secondary | ICD-10-CM

## 2013-06-18 LAB — WET PREP FOR TRICH, YEAST, CLUE
Clue Cells Wet Prep HPF POC: NONE SEEN
Trich, Wet Prep: NONE SEEN
WBC, Wet Prep HPF POC: NONE SEEN
Yeast Wet Prep HPF POC: NONE SEEN

## 2013-06-18 LAB — URINALYSIS W MICROSCOPIC + REFLEX CULTURE
Bilirubin Urine: NEGATIVE
Casts: NONE SEEN
Crystals: NONE SEEN
Glucose, UA: NEGATIVE mg/dL
Ketones, ur: NEGATIVE mg/dL
Leukocytes, UA: NEGATIVE
Nitrite: NEGATIVE
Protein, ur: NEGATIVE mg/dL
Specific Gravity, Urine: >1.03 — ABNORMAL HIGH (ref 1.005–1.030)
Urobilinogen, UA: 0.2 mg/dL (ref 0.0–1.0)
pH: 5.5 (ref 5.0–8.0)

## 2013-06-18 MED ORDER — CIPROFLOXACIN HCL 250 MG PO TABS: 250.0000 mg | ORAL_TABLET | Freq: Two times a day (BID) | ORAL | Status: DC

## 2013-06-18 MED ORDER — NYSTATIN-TRIAMCINOLONE 100000-0.1 UNIT/GM-% EX CREA: TOPICAL_CREAM | Freq: Three times a day (TID) | CUTANEOUS | Status: DC

## 2013-06-18 NOTE — Progress Notes (Signed)
Patient presented to the office today complaining of some low suprapubic discomfort and headache back discomfort. She denied any fever chills nausea or vomiting. Patient with normal menstrual cycles and did one week ago. No vaginal discharge no change in sexual partners. No dysuria only frequency and at times unable to empty completely.  The patient forming also that she notices slight rash on her left abdominal wall with some pruritus. Patient denies any change in detergent or closing or use of any fragments this recently.  Exam: Abdomen nonspecific small erythematous plaque Pelvic: Bartholin urethra Skene was within normal limits Vagina: No lesions or discharge Cervix: No lesions or discharge Bimanual exam: Uterus anteverted normal size shape and consistency nontender  Adnexa: No palpable mass or tenderness Rectal exam: Not done  A wet prep was negative  Urinalysis: 7-to 10 rbc's few bacteria heavy mucus  Assessment/plan: #1 patient with early UTI patient states that in the past similar pattern whereby preliminary urinalysis was insignificant but was recalled back because of positive culture. She will be placed on Cipro 250 mg twice a day for 3 days. #2 nonspecific dermatitis patient will be placed on Mytrex cream to apply twice a day to 3 times a day for the next 7-10 days and if no improvement she will contact the office and will refer to the dermatologist.

## 2013-06-19 LAB — URINE CULTURE
Colony Count: NO GROWTH
Organism ID, Bacteria: NO GROWTH

## 2013-07-04 ENCOUNTER — Ambulatory Visit (INDEPENDENT_AMBULATORY_CARE_PROVIDER_SITE_OTHER): Payer: Self-pay | Admitting: Family Medicine

## 2013-07-04 DIAGNOSIS — M25569 Pain in unspecified knee: Secondary | ICD-10-CM

## 2013-07-04 DIAGNOSIS — Z0289 Encounter for other administrative examinations: Secondary | ICD-10-CM

## 2013-07-07 NOTE — Progress Notes (Signed)
  Subjective:    Patient ID: Julie Mcneil, female    DOB: 02/26/1981, 32 y.o.   MRN: [id number]  HPI  No  show  Review of Systems     Objective:   Physical Exam        Assessment & Plan:

## 2013-08-04 ENCOUNTER — Encounter: Payer: Self-pay | Admitting: Internal Medicine

## 2013-08-04 ENCOUNTER — Ambulatory Visit: Payer: 59 | Admitting: Gynecology

## 2013-08-04 ENCOUNTER — Ambulatory Visit (INDEPENDENT_AMBULATORY_CARE_PROVIDER_SITE_OTHER): Payer: 59 | Admitting: Internal Medicine

## 2013-08-04 VITALS — BP 118/82 | HR 65 | Temp 97.1°F | Ht 64.0 in | Wt 261.8 lb

## 2013-08-04 DIAGNOSIS — N390 Urinary tract infection, site not specified: Secondary | ICD-10-CM

## 2013-08-04 LAB — POCT URINALYSIS DIPSTICK
Bilirubin, UA: NEGATIVE
Glucose, UA: NEGATIVE
Nitrite, UA: NEGATIVE
Protein, UA: 30
Spec Grav, UA: >=1.03
Urobilinogen, UA: 0.2
pH, UA: 5

## 2013-08-04 MED ORDER — CIPROFLOXACIN HCL 500 MG PO TABS: 500.0000 mg | ORAL_TABLET | Freq: Two times a day (BID) | ORAL | Status: DC

## 2013-08-04 NOTE — Patient Instructions (Signed)
Urinary Tract Infection  Urinary tract infections (UTIs) can develop anywhere along your urinary tract. Your urinary tract is your body's drainage system for removing wastes and extra water. Your urinary tract includes two kidneys, two ureters, a bladder, and a urethra. Your kidneys are a pair of bean-shaped organs. Each kidney is about the size of your fist. They are located below your ribs, one on each side of your spine.  CAUSES  Infections are caused by microbes, which are microscopic organisms, including fungi, viruses, and bacteria. These organisms are so small that they can only be seen through a microscope. Bacteria are the microbes that most commonly cause UTIs.  SYMPTOMS   Symptoms of UTIs may vary by age and gender of the patient and by the location of the infection. Symptoms in young women typically include a frequent and intense urge to urinate and a painful, burning feeling in the bladder or urethra during urination. Older women and men are more likely to be tired, shaky, and weak and have muscle aches and abdominal pain. A fever may mean the infection is in your kidneys. Other symptoms of a kidney infection include pain in your back or sides below the ribs, nausea, and vomiting.  DIAGNOSIS  To diagnose a UTI, your caregiver will ask you about your symptoms. Your caregiver also will ask to provide a urine sample. The urine sample will be tested for bacteria and white blood cells. White blood cells are made by your body to help fight infection.  TREATMENT   Typically, UTIs can be treated with medication. Because most UTIs are caused by a bacterial infection, they usually can be treated with the use of antibiotics. The choice of antibiotic and length of treatment depend on your symptoms and the type of bacteria causing your infection.  HOME CARE INSTRUCTIONS   If you were prescribed antibiotics, take them exactly as your caregiver instructs you. Finish the medication even if you feel better after you  have only taken some of the medication.   Drink enough water and fluids to keep your urine clear or pale yellow.   Avoid caffeine, tea, and carbonated beverages. They tend to irritate your bladder.   Empty your bladder often. Avoid holding urine for long periods of time.   Empty your bladder before and after sexual intercourse.   After a bowel movement, women should cleanse from front to back. Use each tissue only once.  SEEK MEDICAL CARE IF:    You have back pain.   You develop a fever.   Your symptoms do not begin to resolve within 3 days.  SEEK IMMEDIATE MEDICAL CARE IF:    You have severe back pain or lower abdominal pain.   You develop chills.   You have nausea or vomiting.   You have continued burning or discomfort with urination.  MAKE SURE YOU:    Understand these instructions.   Will watch your condition.   Will get help right away if you are not doing well or get worse.  Document Released: 08/02/2005 Document Revised: 04/23/2012 Document Reviewed: 12/01/2011  [employer] Patient Information 2014 [employer]

## 2013-08-04 NOTE — Progress Notes (Signed)
HPI  Pt presents to the clinic today with c/o urinary, frequency and dysuria. This started 2 days ago. She denies fever, chills, body aches, nausea or back pain. She has had UTI's in the past and reports this feels the same. She denies vaginal symptoms. She has not taken anything OTC.   Review of Systems  Past Medical History  Diagnosis Date  . Vaginal yeast infection     recurrent  . UTI (urinary tract infection)     recurrent  . GERD (gastroesophageal reflux disease)   . Obesity   . Anxiety     Panic attacks  . Cervical dysplasia     Family History  Problem Relation Age of Onset  . Anesthesia problems Neg Hx   . Hypotension Neg Hx   . Malignant hyperthermia Neg Hx   . Pseudochol deficiency Neg Hx   . Early death Mother   . Heart disease Maternal Grandmother   . Diabetes Maternal Grandmother   . Cancer Maternal Grandmother     Lymphoma  . Hypertension Paternal Grandmother   . Hypertension Paternal Grandfather     History   Social History  . Marital Status: Married    Spouse Name: N/A    Number of Children: N/A  . Years of Education: N/A   Occupational History  . Not on file.   Social History Main Topics  . Smoking status: Never Smoker   . Smokeless tobacco: Never Used  . Alcohol Use: Yes     Comment: Rare  . Drug Use: No  . Sexual Activity: Yes    Birth Control/ Protection: Surgical     Comment: TUBAL LIGATION   Other Topics Concern  . Not on file   Social History Narrative  . No narrative on file    Allergies  Allergen Reactions  . Nitrofurantoin Monohyd Macro Hives and Nausea And Vomiting    Constitutional: Denies fever, malaise, fatigue, headache or abrupt weight changes.   GU: Pt reports urgency, frequency and pain with urination. Denies burning sensation, blood in urine, odor or discharge. Skin: Denies redness, rashes, lesions or ulcercations.   No other specific complaints in a complete review of systems (except as listed in HPI  above).    Objective:   Physical Exam  BP 118/82  Pulse 65  Temp(Src) 97.1 F (36.2 C) (Oral)  Ht 5\' 4"  (1.626 m)  Wt 261 lb 12 oz (118.729 kg)  BMI 44.91 kg/m2  SpO2 98% Wt Readings from Last 3 Encounters:  08/04/13 261 lb 12 oz (118.729 kg)  06/13/13 260 lb (117.935 kg)  03/25/13 259 lb 12.8 oz (117.845 kg)    General: Appears her stated age, well developed, well nourished in NAD. Cardiovascular: Normal rate and rhythm. S1,S2 noted.  No murmur, rubs or gallops noted. No JVD or BLE edema. No carotid bruits noted. Pulmonary/Chest: Normal effort and positive vesicular breath sounds. No respiratory distress. No wheezes, rales or ronchi noted.  Abdomen: Soft and nontender. Normal bowel sounds, no bruits noted. No distention or masses noted. Liver, spleen and kidneys non palpable. Tender to palpation over the bladder area. No CVA tenderness.      Assessment & Plan:   Urgency, frequency and dysuria secondary to UTI:  eRx sent if for Cipro 500 mg BID x 5 days eRx sent in for Diflucan 150 mg for antibiotic induced yeast infection Drink plenty of fluids  RTC as needed or if symptoms persist.

## 2013-08-25 ENCOUNTER — Encounter: Payer: Self-pay | Admitting: Internal Medicine

## 2013-08-25 ENCOUNTER — Ambulatory Visit: Payer: 59

## 2013-08-25 ENCOUNTER — Ambulatory Visit (INDEPENDENT_AMBULATORY_CARE_PROVIDER_SITE_OTHER): Payer: 59 | Admitting: Internal Medicine

## 2013-08-25 VITALS — BP 102/72 | HR 71 | Temp 98.8°F | Ht 64.0 in | Wt 262.4 lb

## 2013-08-25 DIAGNOSIS — G44209 Tension-type headache, unspecified, not intractable: Secondary | ICD-10-CM

## 2013-08-25 DIAGNOSIS — Z23 Encounter for immunization: Secondary | ICD-10-CM

## 2013-08-25 DIAGNOSIS — E669 Obesity, unspecified: Secondary | ICD-10-CM

## 2013-08-25 DIAGNOSIS — Z0000 Encounter for general adult medical examination without abnormal findings: Secondary | ICD-10-CM

## 2013-08-25 LAB — CBC WITH DIFFERENTIAL/PLATELET
Basophils Absolute: 0 10*3/uL (ref 0.0–0.1)
Basophils Relative: 0 % (ref 0–1)
Eosinophils Absolute: 0.3 10*3/uL (ref 0.0–0.7)
Eosinophils Relative: 3 % (ref 0–5)
HCT: 37 % (ref 36.0–46.0)
Hemoglobin: 12.2 g/dL (ref 12.0–15.0)
Lymphocytes Relative: 29 % (ref 12–46)
Lymphs Abs: 2.8 10*3/uL (ref 0.7–4.0)
MCH: 28.5 pg (ref 26.0–34.0)
MCHC: 33 g/dL (ref 30.0–36.0)
MCV: 86.4 fL (ref 78.0–100.0)
Monocytes Absolute: 0.5 10*3/uL (ref 0.1–1.0)
Monocytes Relative: 5 % (ref 3–12)
Neutro Abs: 6.2 10*3/uL (ref 1.7–7.7)
Neutrophils Relative %: 63 % (ref 43–77)
Platelets: 316 10*3/uL (ref 150–400)
RBC: 4.28 MIL/uL (ref 3.87–5.11)
RDW: 14.7 % (ref 11.5–15.5)
WBC: 9.8 10*3/uL (ref 4.0–10.5)

## 2013-08-25 LAB — BASIC METABOLIC PANEL
BUN: 15 mg/dL (ref 6–23)
CO2: 22 mEq/L (ref 19–32)
Calcium: 9.4 mg/dL (ref 8.4–10.5)
Chloride: 103 mEq/L (ref 96–112)
Creat: 0.8 mg/dL (ref 0.50–1.10)
Glucose, Bld: 70 mg/dL (ref 70–99)
Potassium: 4.3 mEq/L (ref 3.5–5.3)
Sodium: 136 mEq/L (ref 135–145)

## 2013-08-25 LAB — LIPID PANEL
Cholesterol: 189 mg/dL (ref 0–200)
HDL: 54 mg/dL (ref >39)
LDL Cholesterol: 113 mg/dL — ABNORMAL HIGH (ref 0–99)
Total CHOL/HDL Ratio: 3.5 Ratio
Triglycerides: 109 mg/dL (ref <150)
VLDL: 22 mg/dL (ref 0–40)

## 2013-08-25 LAB — HEPATIC FUNCTION PANEL
ALT: 15 U/L (ref 0–35)
AST: 18 U/L (ref 0–37)
Albumin: 4.2 g/dL (ref 3.5–5.2)
Alkaline Phosphatase: 77 U/L (ref 39–117)
Bilirubin, Direct: 0.1 mg/dL (ref 0.0–0.3)
Indirect Bilirubin: 0.6 mg/dL (ref 0.0–0.9)
Total Bilirubin: 0.7 mg/dL (ref 0.3–1.2)
Total Protein: 7.5 g/dL (ref 6.0–8.3)

## 2013-08-25 NOTE — Patient Instructions (Addendum)
It was good to see you today.  Your annual flu shot was given and/or updated today.  Health Maintenance reviewed - all recommended immunizations and age-appropriate screenings are up-to-date.  We have reviewed your prior records including labs and tests today  Medications reviewed and updated, no changes recommended at this time.  Test(s) ordered today. Your results will be released to MyChart (or called to you) after review, usually within 72hours after test completion. If any changes need to be made, you will be notified at that same time.  Work on lifestyle changes as discussed (low fat, low carb, increased protein diet; improved exercise efforts; weight loss) to control sugar, blood pressure and cholesterol levels and/or reduce risk of developing other medical problems. Look into [email or web address] or other type of food journal to assist you in this process.  Please schedule followup in 1 year, call sooner if problems.    Health Maintenance, Females A healthy lifestyle and preventative care can promote health and wellness.  Maintain regular health, dental, and eye exams.  Eat a healthy diet. Foods like vegetables, fruits, whole grains, low-fat dairy products, and lean protein foods contain the nutrients you need without too many calories. Decrease your intake of foods high in solid fats, added sugars, and salt. Get information about a proper diet from your caregiver, if necessary.  Regular physical exercise is one of the most important things you can do for your health. Most adults should get at least 150 minutes of moderate-intensity exercise (any activity that increases your heart rate and causes you to sweat) each week. In addition, most adults need muscle-strengthening exercises on 2 or more days a week.   Maintain a healthy weight. The body mass index (BMI) is a screening tool to identify possible weight problems. It provides an estimate of body fat based on height and weight. Your  caregiver can help determine your BMI, and can help you achieve or maintain a healthy weight. For adults 20 years and older:  A BMI below 18.5 is considered underweight.  A BMI of 18.5 to 24.9 is normal.  A BMI of 25 to 29.9 is considered overweight.  A BMI of 30 and above is considered obese.  Maintain normal blood lipids and cholesterol by exercising and minimizing your intake of saturated fat. Eat a balanced diet with plenty of fruits and vegetables. Blood tests for lipids and cholesterol should begin at age 15 and be repeated every 5 years. If your lipid or cholesterol levels are high, you are over 50, or you are a high risk for heart disease, you may need your cholesterol levels checked more frequently.Ongoing high lipid and cholesterol levels should be treated with medicines if diet and exercise are not effective.  If you smoke, find out from your caregiver how to quit. If you do not use tobacco, do not start.  If you are pregnant, do not drink alcohol. If you are breastfeeding, be very cautious about drinking alcohol. If you are not pregnant and choose to drink alcohol, do not exceed 1 drink per day. One drink is considered to be 12 ounces (355 mL) of beer, 5 ounces (148 mL) of wine, or 1.5 ounces (44 mL) of liquor.  Avoid use of street drugs. Do not share needles with anyone. Ask for help if you need support or instructions about stopping the use of drugs.  High blood pressure causes heart disease and increases the risk of stroke. Blood pressure should be checked at least every 1 to 2  years. Ongoing high blood pressure should be treated with medicines, if weight loss and exercise are not effective.  If you are 58 to 32 years old, ask your caregiver if you should take aspirin to prevent strokes.  Diabetes screening involves taking a blood sample to check your fasting blood sugar level. This should be done once every 3 years, after age 25, if you are within normal weight and without risk  factors for diabetes. Testing should be considered at a younger age or be carried out more frequently if you are overweight and have at least 1 risk factor for diabetes.  Breast cancer screening is essential preventative care for women. You should practice "breast self-awareness." This means understanding the normal appearance and feel of your breasts and may include breast self-examination. Any changes detected, no matter how small, should be reported to a caregiver. Women in their 62s and 30s should have a clinical breast exam (CBE) by a caregiver as part of a regular health exam every 1 to 3 years. After age 68, women should have a CBE every year. Starting at age 37, women should consider having a mammogram (breast X-ray) every year. Women who have a family history of breast cancer should talk to their caregiver about genetic screening. Women at a high risk of breast cancer should talk to their caregiver about having an MRI and a mammogram every year.  The Pap test is a screening test for cervical cancer. Women should have a Pap test starting at age 45. Between ages 88 and 70, Pap tests should be repeated every 2 years. Beginning at age 54, you should have a Pap test every 3 years as long as the past 3 Pap tests have been normal. If you had a hysterectomy for a problem that was not cancer or a condition that could lead to cancer, then you no longer need Pap tests. If you are between ages 61 and 18, and you have had normal Pap tests going back 10 years, you no longer need Pap tests. If you have had past treatment for cervical cancer or a condition that could lead to cancer, you need Pap tests and screening for cancer for at least 20 years after your treatment. If Pap tests have been discontinued, risk factors (such as a new sexual partner) need to be reassessed to determine if screening should be resumed. Some women have medical problems that increase the chance of getting cervical cancer. In these cases, your  caregiver may recommend more frequent screening and Pap tests.  The human papillomavirus (HPV) test is an additional test that may be used for cervical cancer screening. The HPV test looks for the virus that can cause the cell changes on the cervix. The cells collected during the Pap test can be tested for HPV. The HPV test could be used to screen women aged 37 years and older, and should be used in women of any age who have unclear Pap test results. After the age of 53, women should have HPV testing at the same frequency as a Pap test.  Colorectal cancer can be detected and often prevented. Most routine colorectal cancer screening begins at the age of 80 and continues through age 9. However, your caregiver may recommend screening at an earlier age if you have risk factors for colon cancer. On a yearly basis, your caregiver may provide home test kits to check for hidden blood in the stool. Use of a small camera at the end of a tube,  to directly examine the colon (sigmoidoscopy or colonoscopy), can detect the earliest forms of colorectal cancer. Talk to your caregiver about this at age 65, when routine screening begins. Direct examination of the colon should be repeated every 5 to 10 years through age 24, unless early forms of pre-cancerous polyps or small growths are found.  Hepatitis C blood testing is recommended for all people born from [birth year] through 1965 and any individual with known risks for hepatitis C.  Practice safe sex. Use condoms and avoid high-risk sexual practices to reduce the spread of sexually transmitted infections (STIs). Sexually active women aged 79 and younger should be checked for Chlamydia, which is a common sexually transmitted infection. Older women with new or multiple partners should also be tested for Chlamydia. Testing for other STIs is recommended if you are sexually active and at increased risk.  Osteoporosis is a disease in which the bones lose minerals and strength with  aging. This can result in serious bone fractures. The risk of osteoporosis can be identified using a bone density scan. Women ages 32 and over and women at risk for fractures or osteoporosis should discuss screening with their caregivers. Ask your caregiver whether you should be taking a calcium supplement or vitamin D to reduce the rate of osteoporosis.  Menopause can be associated with physical symptoms and risks. Hormone replacement therapy is available to decrease symptoms and risks. You should talk to your caregiver about whether hormone replacement therapy is right for you.  Use sunscreen with a sun protection factor (SPF) of 30 or greater. Apply sunscreen liberally and repeatedly throughout the day. You should seek shade when your shadow is shorter than you. Protect yourself by wearing long sleeves, pants, a wide-brimmed hat, and sunglasses year round, whenever you are outdoors.  Notify your caregiver of new moles or changes in moles, especially if there is a change in shape or color. Also notify your caregiver if a mole is larger than the size of a pencil eraser.  Stay current with your immunizations. Document Released: 05/08/2011 Document Revised: 01/15/2012 Document Reviewed: 05/08/2011 [facility] Patient Information 2014 [place], [place]. Exercise to Lose Weight Exercise and a healthy diet may help you lose weight. Your doctor may suggest specific exercises. EXERCISE IDEAS AND TIPS  Choose low-cost things you enjoy doing, such as walking, bicycling, or exercising to workout videos.  Take stairs instead of the elevator.  Walk during your lunch break.  Park your car further away from work or school.  Go to a gym or an exercise class.  Start with 5 to 10 minutes of exercise each day. Build up to 30 minutes of exercise 4 to 6 days a week.  Wear shoes with good support and comfortable clothes.  Stretch before and after working out.  Work out until you breathe harder and your heart  beats faster.  Drink extra water when you exercise.  Do not do so much that you hurt yourself, feel dizzy, or get very short of breath. Exercises that burn about 150 calories:  Running 1  miles in 15 minutes.  Playing volleyball for 45 to 60 minutes.  Washing and waxing a car for 45 to 60 minutes.  Playing touch football for 45 minutes.  Walking 1  miles in 35 minutes.  Pushing a stroller 1  miles in 30 minutes.  Playing basketball for 30 minutes.  Raking leaves for 30 minutes.  Bicycling 5 miles in 30 minutes.  Walking 2 miles in 30 minutes.  Dancing  for 30 minutes.  Shoveling snow for 15 minutes.  Swimming laps for 20 minutes.  Walking up stairs for 15 minutes.  Bicycling 4 miles in 15 minutes.  Gardening for 30 to 45 minutes.  Jumping rope for 15 minutes.  Washing windows or floors for 45 to 60 minutes. Document Released: 11/25/2010 Document Revised: 01/15/2012 Document Reviewed: 11/25/2010 [facility] Patient Information 2014 [place], [place].

## 2013-08-25 NOTE — Assessment & Plan Note (Signed)
The patient is reassured that these symptoms do not appear to represent a serious or threatening condition.

## 2013-08-25 NOTE — Progress Notes (Signed)
Subjective:    Patient ID: Julie Mcneil, female    DOB: [birth year], 32 y.o.   MRN: [id number]  HPI  The patient is here today for routine physical exam.  Chronic medical issues reviewed -  Obesity - weight stable. Patient trying to exercise and watch diet more carefully.    complains of Headaches/fatigue - patient with increase in frequency of headaches lately and with more fatigue.  She has been under increased stress recently with her oldest child needing to be evaluated by GI specialist.  Headaches occur 2-3 times per week.  Patient taking Ibuprofen for symptom relief which helps.  No visual changes/weakness/dizziness/syncope.  She has never tried RX medications for headaches before.  Located right frontal headaches without aura.   Past Medical History  Diagnosis Date  . Vaginal yeast infection     recurrent  . UTI (urinary tract infection)     recurrent  . GERD (gastroesophageal reflux disease)   . Obesity   . Anxiety     Panic attacks  . Cervical dysplasia    Family History  Problem Relation Age of Onset  . Anesthesia problems Neg Hx   . Hypotension Neg Hx   . Malignant hyperthermia Neg Hx   . Pseudochol deficiency Neg Hx   . Early death Mother   . Heart disease Maternal Grandmother   . Diabetes Maternal Grandmother   . Cancer Maternal Grandmother     Lymphoma  . Hypertension Paternal Grandmother   . Hypertension Paternal Grandfather   . Stroke Paternal Grandmother    History  Substance Use Topics  . Smoking status: Never Smoker   . Smokeless tobacco: Never Used  . Alcohol Use: Yes     Comment: Rare    Review of Systems  Constitutional: Negative for fever, chills and activity change.  HENT: Negative for congestion, ear discharge, ear pain, sinus pressure and sore throat.   Eyes: Negative for discharge and itching.  Respiratory: Negative for chest tightness, shortness of breath and wheezing.   Cardiovascular: Negative for chest pain, palpitations and leg  swelling.  Gastrointestinal: Negative for nausea, vomiting, abdominal pain, diarrhea, constipation and abdominal distention.  Genitourinary: Positive for flank pain. Negative for dysuria, frequency and difficulty urinating.  Musculoskeletal: Negative.   Skin: Negative.   Neurological: Positive for headaches. Negative for dizziness, syncope, light-headedness and numbness.  All other systems reviewed and are negative.       Objective:   Physical Exam  Constitutional: She is oriented to person, place, and time. She appears well-developed and well-nourished. No distress.  HENT:  Head: Normocephalic and atraumatic.  Nose: Nose normal.  Mouth/Throat: Oropharynx is clear and moist. No oropharyngeal exudate.  Soft cerumen bilaterally  Eyes: Conjunctivae and EOM are normal. Pupils are equal, round, and reactive to light. Right eye exhibits no discharge. Left eye exhibits no discharge. No scleral icterus.  Neck: Normal range of motion. Neck supple. No thyromegaly present.  Cardiovascular: Normal rate and regular rhythm.  Exam reveals no gallop.   Murmur heard. Pulmonary/Chest: Effort normal and breath sounds normal. No respiratory distress. She has no wheezes.  Abdominal: Soft. Bowel sounds are normal. She exhibits no distension and no mass. There is no tenderness. There is no rebound and no guarding.  Musculoskeletal: Normal range of motion. She exhibits no edema and no tenderness.  Lymphadenopathy:    She has no cervical adenopathy.  Neurological: She is alert and oriented to person, place, and time.  Skin: Skin is warm and dry.  No rash noted. She is not diaphoretic.  Psychiatric: She has a normal mood and affect. Her behavior is normal. Judgment and thought content normal.     Wt Readings from Last 3 Encounters:  08/25/13 262 lb 6.4 oz (119.024 kg)  08/04/13 261 lb 12 oz (118.729 kg)  06/13/13 260 lb (117.935 kg)   BP Readings from Last 3 Encounters:  08/25/13 102/72  08/04/13  118/82  06/13/13 132/84   Lab Results  Component Value Date   WBC 9.7 04/30/2012   HGB 11.7* 04/30/2012   HCT 34.8* 04/30/2012   PLT 226.0 04/30/2012   GLUCOSE 90 04/30/2012   CHOL 178 04/30/2012   TRIG 96.0 04/30/2012   HDL 58.90 04/30/2012   LDLCALC 100* 04/30/2012   ALT 11 04/30/2012   AST 19 04/30/2012   NA 137 04/30/2012   K 4.1 04/30/2012   CL 103 04/30/2012   CREATININE 0.6 04/30/2012   BUN 12 04/30/2012   CO2 25 04/30/2012   TSH 2.27 04/30/2012        Assessment & Plan:  CPx/v70.0- Patient has been counseled on age-appropriate routine health concerns for screening and prevention. These are reviewed and up-to-date. Immunizations are up-to-date or declined. Labs ordered and reviewed.  See problem list -  Headaches/fatigue - suspect tension/stress related, no neuro deficits or red flags on hx/exam - reassurance offered today.  Pt encouraged to exercise and eat a well balanced diet.  Advised to continue Ibuprofen alternating with Tylenol for symptom relief.  Pt to call back with worsening symptoms.

## 2013-08-25 NOTE — Assessment & Plan Note (Addendum)
The patient is asked to make an attempt to improve diet and exercise patterns to aid in medical management of this problem.  Wt Readings from Last 3 Encounters:  08/25/13 262 lb 6.4 oz (119.024 kg)  08/04/13 261 lb 12 oz (118.729 kg)  06/13/13 260 lb (117.935 kg)

## 2013-08-26 LAB — URINALYSIS, MICROSCOPIC ONLY
Bacteria, UA: NONE SEEN
Casts: NONE SEEN
Crystals: NONE SEEN
Squamous Epithelial / LPF: NONE SEEN

## 2013-08-26 LAB — URINALYSIS, ROUTINE W REFLEX MICROSCOPIC
Bilirubin Urine: NEGATIVE
Glucose, UA: NEGATIVE mg/dL
Ketones, ur: NEGATIVE mg/dL
Leukocytes, UA: NEGATIVE
Nitrite: NEGATIVE
Protein, ur: NEGATIVE mg/dL
Specific Gravity, Urine: 1.016 (ref 1.005–1.030)
Urobilinogen, UA: 0.2 mg/dL (ref 0.0–1.0)
pH: 5 (ref 5.0–8.0)

## 2013-08-26 LAB — TSH: TSH: 2.59 u[IU]/mL (ref 0.350–4.500)

## 2013-11-02 ENCOUNTER — Other Ambulatory Visit: Payer: Self-pay

## 2013-11-02 ENCOUNTER — Emergency Department (HOSPITAL_COMMUNITY): Payer: 59

## 2013-11-02 ENCOUNTER — Emergency Department (HOSPITAL_COMMUNITY)
Admission: EM | Admit: 2013-11-02 | Discharge: 2013-11-03 | Disposition: A | Discharge status: Home / Self Care | Payer: 59 | Attending: Emergency Medicine | Admitting: Emergency Medicine

## 2013-11-02 ENCOUNTER — Encounter (HOSPITAL_COMMUNITY): Payer: Self-pay | Admitting: Emergency Medicine

## 2013-11-02 DIAGNOSIS — H53149 Visual discomfort, unspecified: Secondary | ICD-10-CM | POA: Insufficient documentation

## 2013-11-02 DIAGNOSIS — R112 Nausea with vomiting, unspecified: Secondary | ICD-10-CM | POA: Insufficient documentation

## 2013-11-02 DIAGNOSIS — Z3202 Encounter for pregnancy test, result negative: Secondary | ICD-10-CM | POA: Insufficient documentation

## 2013-11-02 DIAGNOSIS — E669 Obesity, unspecified: Secondary | ICD-10-CM | POA: Insufficient documentation

## 2013-11-02 DIAGNOSIS — Z87440 Personal history of urinary (tract) infections: Secondary | ICD-10-CM | POA: Insufficient documentation

## 2013-11-02 DIAGNOSIS — Z8719 Personal history of other diseases of the digestive system: Secondary | ICD-10-CM | POA: Insufficient documentation

## 2013-11-02 DIAGNOSIS — Z8619 Personal history of other infectious and parasitic diseases: Secondary | ICD-10-CM | POA: Insufficient documentation

## 2013-11-02 DIAGNOSIS — R51 Headache: Secondary | ICD-10-CM | POA: Insufficient documentation

## 2013-11-02 DIAGNOSIS — Z8742 Personal history of other diseases of the female genital tract: Secondary | ICD-10-CM | POA: Insufficient documentation

## 2013-11-02 DIAGNOSIS — R42 Dizziness and giddiness: Secondary | ICD-10-CM | POA: Insufficient documentation

## 2013-11-02 DIAGNOSIS — R0789 Other chest pain: Secondary | ICD-10-CM | POA: Insufficient documentation

## 2013-11-02 DIAGNOSIS — R519 Headache, unspecified: Secondary | ICD-10-CM

## 2013-11-02 MED ORDER — ONDANSETRON 8 MG PO TBDP
8.0000 mg | ORAL_TABLET | Freq: Once | ORAL | Status: AC
  Administered 2013-11-02: 8 mg via ORAL
  Filled 2013-11-02: qty 1

## 2013-11-02 MED ORDER — DIPHENHYDRAMINE HCL 50 MG/ML IJ SOLN
25.0000 mg | Freq: Once | INTRAMUSCULAR | Status: AC
  Administered 2013-11-03: 25 mg via INTRAVENOUS
  Filled 2013-11-02: qty 1

## 2013-11-02 MED ORDER — METOCLOPRAMIDE HCL 5 MG/ML IJ SOLN
10.0000 mg | Freq: Once | INTRAMUSCULAR | Status: AC
  Administered 2013-11-03: 10 mg via INTRAVENOUS
  Filled 2013-11-02: qty 2

## 2013-11-02 MED ORDER — SODIUM CHLORIDE 0.9 % IV BOLUS (SEPSIS)
1000.0000 mL | Freq: Once | INTRAVENOUS | Status: AC
  Administered 2013-11-03: 1000 mL via INTRAVENOUS

## 2013-11-02 NOTE — ED Provider Notes (Signed)
CSN: [id number]     Arrival date & time 11/02/13  2246 History   First MD Initiated Contact with Patient 11/02/13 2322     Chief Complaint  Patient presents with  . Chest Pain   (Consider location/radiation/quality/duration/timing/severity/associated sxs/prior Treatment) HPI Comments: 32 year old female presents with chest pain described as a tightness over the last 5-6 hours. She states it initially started while she is at the store has since improved. No shortness of breath. She's been having dizziness, right-sided headache, and nausea and vomiting over the last 2 days. It has been coming off and on. When it comes it is gradually worsening. She has a history of headaches but not particularly like this. She describes the dizziness as both a room spinning but also a lightheadedness. She states the dizziness seems to get worse and she lays flat or turns her head to the left. She states it feels like a pressure around her eye but has not noted any blurry vision. She does have a little bit of photophobia. Has not had any weakness or trouble walking. The symptoms do not [place] worse with standing. Her chest pain currently feels better after her most recent episode of vomiting. Denies any diarrhea or abdominal pain. She's currently on her period which has been normal   Past Medical History  Diagnosis Date  . Vaginal yeast infection     recurrent  . UTI (urinary tract infection)     recurrent  . GERD (gastroesophageal reflux disease)   . Obesity   . Anxiety     Panic attacks  . Cervical dysplasia    Past Surgical History  Procedure Laterality Date  . Cesarean section  [id number],[id number]    11-28-11  . Tubal ligation      2013 C-SECTION  . Colposcopy     Family History  Problem Relation Age of Onset  . Anesthesia problems Neg Hx   . Hypotension Neg Hx   . Malignant hyperthermia Neg Hx   . Pseudochol deficiency Neg Hx   . Early death Mother   . Heart disease Maternal Grandmother   . Diabetes  Maternal Grandmother   . Cancer Maternal Grandmother     Lymphoma  . Hypertension Paternal Grandmother   . Hypertension Paternal Grandfather   . Stroke Paternal Grandmother    History  Substance Use Topics  . Smoking status: Never Smoker   . Smokeless tobacco: Never Used  . Alcohol Use: Yes     Comment: Rare   OB History   Grav Para Term Preterm Abortions TAB SAB Ect Mult Living   4 3 3  0 1 0 0 1 0 3     Review of Systems  Constitutional: Negative for fever and chills.  Eyes: Positive for photophobia. Negative for visual disturbance.  Respiratory: Negative for shortness of breath.   Cardiovascular: Positive for chest pain.  Gastrointestinal: Positive for nausea and vomiting. Negative for abdominal pain.  Neurological: Positive for dizziness and headaches. Negative for weakness.  All other systems reviewed and are negative.    Allergies  Nitrofurantoin monohyd macro  Home Medications   Current Outpatient Rx  Name  Route  Sig  Dispense  Refill  . ibuprofen (ADVIL,MOTRIN) 200 MG tablet   Oral   Take 200 mg by mouth every 6 (six) hours as needed.          BP 139/84  Pulse 87  Temp(Src) 98.5 F (36.9 C) (Oral)  Resp 19  Ht 5\' 3"  (1.6 m)  Wt  258 lb (117.028 kg)  BMI 45.71 kg/m2  SpO2 100%  LMP 11/02/2013 Physical Exam  Nursing note and vitals reviewed. Constitutional: She is oriented to person, place, and time. She appears well-developed and well-nourished.  HENT:  Head: Normocephalic and atraumatic.  Right Ear: External ear normal.  Left Ear: External ear normal.  Nose: Nose normal.  Eyes: EOM are normal. Pupils are equal, round, and reactive to light. Right eye exhibits no discharge. Left eye exhibits no discharge.  Cardiovascular: Normal rate, regular rhythm and normal heart sounds.   Pulmonary/Chest: Effort normal and breath sounds normal.  Abdominal: Soft. There is no tenderness.  Neurological: She is alert and oriented to person, place, and time. She  has normal strength. No cranial nerve deficit or sensory deficit. She exhibits normal muscle tone. Coordination and gait normal. GCS eye subscore is 4. GCS verbal subscore is 5. GCS motor subscore is 6.  5 out of 5 strength in all 4 extremities. Normal cerebellar testing. Normal gait.  Skin: Skin is warm and dry.    ED Course  Procedures (including critical care time) Labs Review Labs Reviewed  CBC - Abnormal; Notable for the following:    WBC 14.4 (*)    HCT 35.5 (*)    All other components within normal limits  URINALYSIS, ROUTINE W REFLEX MICROSCOPIC - Abnormal; Notable for the following:    APPearance CLOUDY (*)    Hgb urine dipstick LARGE (*)    Protein, ur 30 (*)    All other components within normal limits  BASIC METABOLIC PANEL  URINE MICROSCOPIC-ADD ON  POCT I-STAT TROPONIN I  POCT PREGNANCY, URINE   Imaging Review Dg Chest 2 View  11/02/2013   CLINICAL DATA:  Chest pain.  EXAM: CHEST  2 VIEW  COMPARISON:  None.  FINDINGS: Cardiomediastinal silhouette unremarkable. Lungs clear. Bronchovascular markings normal. Pulmonary vascularity normal. No pneumothorax. No pleural effusions. Visualized bony thorax intact.  IMPRESSION: Normal examination.   Electronically Signed   By: Hulan Saas M.D.   On: 11/02/2013 23:28    EKG Interpretation   None       Date: 11/03/2013  Rate: 98  Rhythm: normal sinus rhythm  QRS Axis: normal  Intervals: normal  ST/T Wave abnormalities: normal  Conduction Disutrbances:none  Narrative Interpretation:   Old EKG Reviewed: unchanged   MDM   1. Headache   2. Atypical chest pain    Patient's HA c/w a likely migraine. Gradual on and off with dizziness, nausea/vomiting and photophobia. Normal neuro exam, including gait. Well appearance. CP constant and now resolved after 6-7 hours. With neg EKG and trop, feel ACS is r/o. PERC neg and low risk for PE. Given normal neuro exam, well appearance and benign appearing HA I feel imaging is not  emergently indicated. Dizziness resolved with fluids and pain control. She feels ok to go, will f/u with PCP.    Audree Camel, MD 11/03/13 1005

## 2013-11-02 NOTE — ED Notes (Signed)
Pt reports having centralized chest pain with radiation to the neck that started at 1700. Pt reports dizziness, shortness of breath, nausea, and emesis. Pt had one episode of emesis during triage, which she reported made her feel better. Pt reports the dizziness and nausea began on Friday. Pt is A/O x4 and vital signs are WDL.

## 2013-11-03 LAB — CBC
HCT: 35.5 % — ABNORMAL LOW (ref 36.0–46.0)
Hemoglobin: 12 g/dL (ref 12.0–15.0)
MCH: 28.8 pg (ref 26.0–34.0)
MCHC: 33.8 g/dL (ref 30.0–36.0)
MCV: 85.3 fL (ref 78.0–100.0)
Platelets: 326 10*3/uL (ref 150–400)
RBC: 4.16 MIL/uL (ref 3.87–5.11)
RDW: 13.5 % (ref 11.5–15.5)
WBC: 14.4 10*3/uL — ABNORMAL HIGH (ref 4.0–10.5)

## 2013-11-03 LAB — URINALYSIS, ROUTINE W REFLEX MICROSCOPIC
Bilirubin Urine: NEGATIVE
Glucose, UA: NEGATIVE mg/dL
Ketones, ur: NEGATIVE mg/dL
Leukocytes, UA: NEGATIVE
Nitrite: NEGATIVE
Protein, ur: 30 mg/dL — AB
Specific Gravity, Urine: 1.023 (ref 1.005–1.030)
Urobilinogen, UA: 0.2 mg/dL (ref 0.0–1.0)
pH: 7.5 (ref 5.0–8.0)

## 2013-11-03 LAB — BASIC METABOLIC PANEL
BUN: 13 mg/dL (ref 6–23)
CO2: 26 mEq/L (ref 19–32)
Calcium: 9.1 mg/dL (ref 8.4–10.5)
Chloride: 103 mEq/L (ref 96–112)
Creatinine, Ser: 0.72 mg/dL (ref 0.50–1.10)
GFR calc Af Amer: >90 mL/min (ref >90)
GFR calc non Af Amer: >90 mL/min (ref >90)
Glucose, Bld: 99 mg/dL (ref 70–99)
Potassium: 4.1 mEq/L (ref 3.5–5.1)
Sodium: 141 mEq/L (ref 135–145)

## 2013-11-03 LAB — POCT I-STAT TROPONIN I: Troponin i, poc: 0.04 ng/mL (ref 0.00–0.08)

## 2013-11-03 LAB — URINE MICROSCOPIC-ADD ON

## 2013-11-03 LAB — POCT PREGNANCY, URINE: Preg Test, Ur: NEGATIVE

## 2013-11-03 MED ORDER — ONDANSETRON HCL 4 MG PO TABS: 4.0000 mg | ORAL_TABLET | Freq: Four times a day (QID) | ORAL | Status: DC | PRN

## 2013-11-03 MED ORDER — KETOROLAC TROMETHAMINE 30 MG/ML IJ SOLN
30.0000 mg | Freq: Once | INTRAMUSCULAR | Status: AC
  Administered 2013-11-03: 30 mg via INTRAVENOUS

## 2013-11-03 MED ORDER — KETOROLAC TROMETHAMINE 30 MG/ML IJ SOLN
INTRAMUSCULAR | Status: AC
  Filled 2013-11-03: qty 1

## 2013-11-07 ENCOUNTER — Ambulatory Visit (INDEPENDENT_AMBULATORY_CARE_PROVIDER_SITE_OTHER): Payer: 59 | Admitting: Internal Medicine

## 2013-11-07 ENCOUNTER — Encounter: Payer: Self-pay | Admitting: Internal Medicine

## 2013-11-07 VITALS — BP 102/80 | HR 73 | Temp 97.1°F | Wt 260.8 lb

## 2013-11-07 DIAGNOSIS — H6123 Impacted cerumen, bilateral: Secondary | ICD-10-CM

## 2013-11-07 DIAGNOSIS — R51 Headache: Secondary | ICD-10-CM

## 2013-11-07 DIAGNOSIS — J069 Acute upper respiratory infection, unspecified: Secondary | ICD-10-CM

## 2013-11-07 DIAGNOSIS — H6120 Impacted cerumen, unspecified ear: Secondary | ICD-10-CM

## 2013-11-07 MED ORDER — AMOXICILLIN 875 MG PO TABS: 875.0000 mg | ORAL_TABLET | Freq: Two times a day (BID) | ORAL | Status: DC

## 2013-11-07 NOTE — Progress Notes (Signed)
Subjective:    Patient ID: Julie Mcneil, female    DOB: 1981-08-03, 33 y.o.   MRN: [id number]  HPI Ms. Gasner presents for evaluation of a headache over the past week. Her symptoms started as dizziness following her ear popping. 24 hrs later she developed a headach. She did go to the ED Sunday, 11/03/13 due to nausea, emesis and continued HA: records reviewed: she had normal chemistry panel, normal CXR, normal EKG, negative troponin. U/A with blood (day two of menses). CBC with WBC 14.4. She was given IV fluids, benadryl, zofran, and an IV pain medication (?). She did leave with continued headache.   Since the ED visit she has continued to feel a pressure headache in the right periorbital region, fullness in the ear but no fever or chills, no rhinorrhea.   Past Medical History  Diagnosis Date  . Vaginal yeast infection     recurrent  . UTI (urinary tract infection)     recurrent  . GERD (gastroesophageal reflux disease)   . Obesity   . Anxiety     Panic attacks  . Cervical dysplasia    Past Surgical History  Procedure Laterality Date  . Cesarean section  2007,2011    11-28-11  . Tubal ligation      20 13 C-SECTION  . Colposcopy     Family History  Problem Relation Age of Onset  . Anesthesia problems Neg Hx   . Hypotension Neg Hx   . Malignant hyperthermia Neg Hx   . Pseudochol deficiency Neg Hx   . Early death Mother   . Heart disease Maternal Grandmother   . Diabetes Maternal Grandmother   . Cancer Maternal Grandmother     Lymphoma  . Hypertension Paternal Grandmother   . Hypertension Paternal Grandfather   . Stroke Paternal Grandmother    History   Social History  . Marital Status: Married    Spouse Name: N/A    Number of Children: N/A  . Years of Education: N/A   Occupational History  . Not on file.   Social History Main Topics  . Smoking status: Never Smoker   . Smokeless tobacco: Never Used  . Alcohol Use: Yes     Comment: Rare  . Drug Use: No  .  Sexual Activity: Yes    Birth Control/ Protection: Surgical     Comment: TUBAL LIGATION   Other Topics Concern  . Not on file   Social History Narrative  . No narrative on file       Review of Systems System review is negative for any constitutional, cardiac, pulmonary, GI or neuro symptoms or complaints other than as described in the HPI.     Objective:   Physical Exam Filed Vitals:   11/07/13 0924  BP: 102/80  Pulse: 73  Temp: 97.1 F (36.2 C)   Gen'l - overweight woman in no acute distress HEENT - EACs with cerumen, after irrigation TMs are normal, Mild discomfort to percussion over the right frontal and maxillary sinus. Cor - RRR Pulm - normal respirations Neuro - CN II-XII normal, normal gait and station        Assessment & Plan:  1. Headache - not a classic tension headache and suspect that a sinus infection is the proximal cause of the headache. Symptoms today and the report from  ED not classic for migraine.  Plan Take the ibuprofen 200 mg 2 tabs every 6 hours as needed for headache  2. Sinus infection - not  typical, no fever or snot but tenderness in the sinus area, congestion affecting right eustachian tube and may be triggering headache.  Plan Amoxicillin 875 mg twice a day  Low dose pseudoephedrine (behind the counter) 30 mg twice a day  Vitamin C  Hydrate.  For lack of improvement please come back.

## 2013-11-07 NOTE — Progress Notes (Signed)
Pre visit review using our clinic review tool, if applicable. No additional management support is needed unless otherwise documented below in the visit note. 

## 2013-11-07 NOTE — Patient Instructions (Signed)
1. Headache - not a classic tension headache and suspect that a sinus infection is the proximal cause of the headache. Symptoms today and the report from  ED not classic for migraine.  Plan Take the ibuprofen 200 mg 2 tabs every 6 hours as needed for headache  2. Sinus infection - not typical, no fever or snot but tenderness in the sinus area, congestion affecting right eustachian tube and may be triggering headache.  Plan Amoxicillin 875 mg twice a day  Low dose pseudoephedrine (behind the counter) 30 mg twice a day  Vitamin C  Hydrate.  For lack of improvement please come back.

## 2014-01-22 ENCOUNTER — Ambulatory Visit (INDEPENDENT_AMBULATORY_CARE_PROVIDER_SITE_OTHER): Payer: 59 | Admitting: Internal Medicine

## 2014-01-22 ENCOUNTER — Other Ambulatory Visit (INDEPENDENT_AMBULATORY_CARE_PROVIDER_SITE_OTHER): Payer: 59

## 2014-01-22 ENCOUNTER — Encounter: Payer: Self-pay | Admitting: Internal Medicine

## 2014-01-22 VITALS — BP 122/88 | HR 91 | Temp 98.2°F | Ht 64.0 in | Wt 267.5 lb

## 2014-01-22 DIAGNOSIS — B349 Viral infection, unspecified: Secondary | ICD-10-CM

## 2014-01-22 DIAGNOSIS — R002 Palpitations: Secondary | ICD-10-CM

## 2014-01-22 DIAGNOSIS — R42 Dizziness and giddiness: Secondary | ICD-10-CM

## 2014-01-22 DIAGNOSIS — B9789 Other viral agents as the cause of diseases classified elsewhere: Secondary | ICD-10-CM

## 2014-01-22 LAB — BASIC METABOLIC PANEL
BUN: 13 mg/dL (ref 6–23)
CO2: 24 mEq/L (ref 19–32)
Calcium: 9.6 mg/dL (ref 8.4–10.5)
Chloride: 104 mEq/L (ref 96–112)
Creatinine, Ser: 0.7 mg/dL (ref 0.4–1.2)
GFR: 98.02 mL/min (ref >60.00)
Glucose, Bld: 97 mg/dL (ref 70–99)
Potassium: 4 mEq/L (ref 3.5–5.1)
Sodium: 139 mEq/L (ref 135–145)

## 2014-01-22 LAB — CBC WITH DIFFERENTIAL/PLATELET
Basophils Absolute: 0.1 10*3/uL (ref 0.0–0.1)
Basophils Relative: 0.7 % (ref 0.0–3.0)
Eosinophils Absolute: 0.3 10*3/uL (ref 0.0–0.7)
Eosinophils Relative: 2.2 % (ref 0.0–5.0)
HCT: 36.6 % (ref 36.0–46.0)
Hemoglobin: 12.1 g/dL (ref 12.0–15.0)
Lymphocytes Relative: 22.3 % (ref 12.0–46.0)
Lymphs Abs: 2.5 10*3/uL (ref 0.7–4.0)
MCHC: 33.1 g/dL (ref 30.0–36.0)
MCV: 85.7 fl (ref 78.0–100.0)
Monocytes Absolute: 0.5 10*3/uL (ref 0.1–1.0)
Monocytes Relative: 4.6 % (ref 3.0–12.0)
Neutro Abs: 7.9 10*3/uL — ABNORMAL HIGH (ref 1.4–7.7)
Neutrophils Relative %: 70.2 % (ref 43.0–77.0)
Platelets: 353 10*3/uL (ref 150.0–400.0)
RBC: 4.28 Mil/uL (ref 3.87–5.11)
RDW: 14.2 % (ref 11.5–14.6)
WBC: 11.3 10*3/uL — ABNORMAL HIGH (ref 4.5–10.5)

## 2014-01-22 LAB — HEPATIC FUNCTION PANEL
ALT: 16 U/L (ref 0–35)
AST: 16 U/L (ref 0–37)
Albumin: 4.2 g/dL (ref 3.5–5.2)
Alkaline Phosphatase: 88 U/L (ref 39–117)
Bilirubin, Direct: 0.1 mg/dL (ref 0.0–0.3)
Total Bilirubin: 0.6 mg/dL (ref 0.3–1.2)
Total Protein: 7.7 g/dL (ref 6.0–8.3)

## 2014-01-22 LAB — TSH: TSH: 2.28 u[IU]/mL (ref 0.35–5.50)

## 2014-01-22 NOTE — Patient Instructions (Addendum)
Your EKG was OK today  Please continue all other medications as before, and refills have been done if requested. Please have the pharmacy call with any other refills you may need.  You will be contacted regarding the referral for: echocardiogram, holter monitor, and cardiology  Please go to the LAB in the Basement (turn left off the elevator) for the tests to be done today You will be contacted by phone if any changes need to be made immediately.  Otherwise, you will receive a letter about your results with an explanation, but please check with MyChart first.

## 2014-01-22 NOTE — Progress Notes (Signed)
Subjective:    Patient ID: Julie SaneElizabeth A Karr, female    DOB: 10/22/1981, 33 y.o.   MRN: [id number]  HPI  Here to f/u, has already made appt with card/dr hilty for apr 9, but with what sounds like viral illness last 2 days (after one of her children with similar just prior) with upper resp congestion, ear popping, fatigue, several episodes water diarrheam and dizziness but no ST, HA, cough, fever. Pt denies chest pain, increased sob or doe, wheezing, orthopnea, PND, increased LE swelling, or syncope but for 2 days in a row now has had 2 min episodes of sudden onset heart flip flops with abrupt start and stop, assoc with worsening dizziness, ? Mild sob, and definite LE weakness. No falls or syncope.  Has 2 small children, became concerned yest, made card appt, then came here after second episode this am.  Last cbc and TSH normal Past Medical History  Diagnosis Date  . Vaginal yeast infection     recurrent  . UTI (urinary tract infection)     recurrent  . GERD (gastroesophageal reflux disease)   . Obesity   . Anxiety     Panic attacks  . Cervical dysplasia    Past Surgical History  Procedure Laterality Date  . Cesarean section  [id number],2011    11-28-11  . Tubal ligation      2013 C-SECTION  . Colposcopy      reports that she has never smoked. She has never used smokeless tobacco. She reports that she drinks alcohol. She reports that she does not use illicit drugs. family history includes Cancer in her maternal grandmother; Diabetes in her maternal grandmother; Early death in her mother; Heart disease in her maternal grandmother; Hypertension in her paternal grandfather and paternal grandmother; Stroke in her paternal grandmother. There is no history of Anesthesia problems, Hypotension, Malignant hyperthermia, or Pseudochol deficiency. Allergies  Allergen Reactions  . Nitrofurantoin Monohyd Macro Hives and Nausea And Vomiting   No current outpatient prescriptions on file prior to visit.    No current facility-administered medications on file prior to visit.   Review of Systems  Constitutional: Negative for unexpected weight change, or unusual diaphoresis  HENT: Negative for tinnitus.   Eyes: Negative for photophobia and visual disturbance.  Respiratory: Negative for choking and stridor.   Gastrointestinal: Negative for vomiting and blood in stool.  Genitourinary: Negative for hematuria and decreased urine volume.  Musculoskeletal: Negative for acute joint swelling Skin: Negative for color change and wound.  Neurological: Negative for tremors and numbness other than noted  Psychiatric/Behavioral: Negative for decreased concentration or  hyperactivity.       Objective:   Physical Exam BP 122/88  Pulse 91  Temp(Src) 98.2 F (36.8 C) (Oral)  Ht 5\' 4"  (1.626 m)  Wt 267 lb 8 oz (121.337 kg)  BMI 45.89 kg/m2  SpO2 97% VS noted,  Constitutional: Pt appears well-developed and well-nourished.  HENT: Head: NCAT.  Right Ear: External ear normal.  Left Ear: External ear normal.  Bilat tm's with mild erythema.  Julie sinus areas non tender.  Pharynx with mild erythema, no exudate Eyes: Conjunctivae and EOM are normal. Pupils are equal, round, and reactive to light.  Neck: Normal range of motion. Neck supple.  Cardiovascular: Normal rate and regular rhythm.   Pulmonary/Chest: Effort normal and breath sounds normal.  Abd:  Soft, NT, non-distended, + BS Neurological: Pt is alert. Not confused  Skin: Skin is warm. No erythema.  Psychiatric: Pt behavior is  normal. Thought content normal.     Assessment & Plan:

## 2014-01-22 NOTE — Assessment & Plan Note (Addendum)
?   Reactive to mild acute illness, but with dizzy/weakness will need holter, echo, labs today, and f/u with card as planned; ECG reviewed as per emr

## 2014-01-22 NOTE — Assessment & Plan Note (Signed)
C/w mild transient illness now improved today, for fluids, tylenol, no other specific eval at this time, exam o/w benign

## 2014-01-22 NOTE — Progress Notes (Signed)
Pre visit review using our clinic review tool, if applicable. No additional management support is needed unless otherwise documented below in the visit note. 

## 2014-01-23 ENCOUNTER — Encounter: Payer: Self-pay | Admitting: Internal Medicine

## 2014-01-26 ENCOUNTER — Telehealth: Payer: Self-pay | Admitting: Internal Medicine

## 2014-01-26 NOTE — Telephone Encounter (Signed)
Patient Information:  Caller Name: Julie Mcneil  Phone: [phone]  Patient: Julie Mcneil, Julie Mcneil  Gender: Female  DOB: 03/02/1981  Age: 33 Years  PCP: Rene PaciLeschber, Valerie (Adults only)  Pregnant: No  Office Follow Up:  Does the office need to follow up with this patient?: Yes  Instructions For The Office: Calling because dizziness continues,feels off balance like she is going to tip over,palpitations,numbness and sli weakness in hands and arms; also sinus pressure and headache which are new sxs. Was seen at office on 01/21/14 for dizziness, palpitations and numbness in arms and hands. Afebrile. Has an appt with cardiologist on 02/12/14. No appts available today per scheduler. [facility] Please advise.  RN Note:  Will send Mcneil message to the office to follow-up with pt.  Symptoms  Reason For Call & Symptoms: Calling because dizziness continues,feels off balance like she is going to tip over,palpitations,numbness and sli weakness in hands and arms; also sinus pressure and headache which are new sxs. Was seen at office on 01/21/14 for dizziness, palpitations and numbness in arms and hands. Afebrile. Has an appt with cardiologist on 02/12/14.  Reviewed Health History In EMR: Yes  Reviewed Medications In EMR: Yes  Reviewed Allergies In EMR: Yes  Reviewed Surgeries / Procedures: Yes  Date of Onset of Symptoms: 01/18/2014 OB / GYN:  LMP: 01/23/2014  Guideline(s) Used:  Dizziness  Disposition Per Guideline:   Go to Office Now  Reason For Disposition Reached:   Spinning or tilting sensation (vertigo) present now  Advice Given:  Call Back If:  You become worse.  Patient Will Follow Care Advice:  YES

## 2014-01-30 ENCOUNTER — Encounter: Payer: Self-pay | Admitting: Radiology

## 2014-01-30 ENCOUNTER — Encounter (INDEPENDENT_AMBULATORY_CARE_PROVIDER_SITE_OTHER): Payer: 59

## 2014-01-30 DIAGNOSIS — R002 Palpitations: Secondary | ICD-10-CM

## 2014-01-30 DIAGNOSIS — R42 Dizziness and giddiness: Secondary | ICD-10-CM

## 2014-01-30 NOTE — Progress Notes (Signed)
Patient ID: Julie SaneElizabeth A Mcneil, female   DOB: 11/04/1981, 33 y.o.   MRN: [id number] E cardio 24hr holter applied

## 2014-02-09 ENCOUNTER — Ambulatory Visit (HOSPITAL_COMMUNITY)
Admission: RE | Admit: 2014-02-09 | Discharge: 2014-02-09 | Disposition: A | Discharge status: Home / Self Care | Payer: 59 | Source: Ambulatory Visit | Attending: Cardiology | Admitting: Cardiology

## 2014-02-09 DIAGNOSIS — R42 Dizziness and giddiness: Secondary | ICD-10-CM

## 2014-02-09 DIAGNOSIS — R5381 Other malaise: Secondary | ICD-10-CM

## 2014-02-09 DIAGNOSIS — R002 Palpitations: Secondary | ICD-10-CM

## 2014-02-09 DIAGNOSIS — R5383 Other fatigue: Secondary | ICD-10-CM

## 2014-02-09 NOTE — Progress Notes (Signed)
2D Echo Performed 02/09/2014    Lorianna Spadaccini, RCS  

## 2014-02-12 ENCOUNTER — Ambulatory Visit: Payer: 59 | Admitting: Internal Medicine

## 2014-02-12 ENCOUNTER — Telehealth: Payer: Self-pay | Admitting: *Deleted

## 2014-02-12 NOTE — Telephone Encounter (Signed)
24 hour monitor reviewed by  Dr. Patty SermonsBrackbill (office DOD), interpretation: normal holter, no arrhythmia.  Faxed to Dr Felicity CoyerLeschber and Dr Rennis GoldenHilty

## 2014-03-16 ENCOUNTER — Ambulatory Visit (INDEPENDENT_AMBULATORY_CARE_PROVIDER_SITE_OTHER): Payer: 59 | Admitting: Gynecology

## 2014-03-16 ENCOUNTER — Encounter: Payer: Self-pay | Admitting: Gynecology

## 2014-03-16 VITALS — BP 124/80

## 2014-03-16 DIAGNOSIS — R350 Frequency of micturition: Secondary | ICD-10-CM

## 2014-03-16 DIAGNOSIS — N390 Urinary tract infection, site not specified: Secondary | ICD-10-CM

## 2014-03-16 LAB — URINALYSIS W MICROSCOPIC + REFLEX CULTURE
Bilirubin Urine: NEGATIVE
Casts: NONE SEEN
Crystals: NONE SEEN
Glucose, UA: NEGATIVE mg/dL
Ketones, ur: NEGATIVE mg/dL
Leukocytes, UA: NEGATIVE
Nitrite: NEGATIVE
Protein, ur: NEGATIVE mg/dL
Specific Gravity, Urine: >1.03 — ABNORMAL HIGH (ref 1.005–1.030)
Urobilinogen, UA: 0.2 mg/dL (ref 0.0–1.0)
pH: 5.5 (ref 5.0–8.0)

## 2014-03-16 MED ORDER — SULFAMETHOXAZOLE-TMP DS 800-160 MG PO TABS: 1.0000 | ORAL_TABLET | Freq: Two times a day (BID) | ORAL | Status: DC

## 2014-03-16 MED ORDER — FLUCONAZOLE 150 MG PO TABS: 150.0000 mg | ORAL_TABLET | Freq: Once | ORAL | Status: DC

## 2014-03-16 NOTE — Progress Notes (Signed)
   33 year old presented to the office today complaining of one-day history of urinary frequency. Patient denied any fever, chills, nausea or vomiting. Patient with some mild back discomfort. Patient with previous tubal sterilization procedure. The patient's last menstrual cycle was 2 weeks ago.  Exam: Back: No CVA tenderness Abdomen: Suprapubic tenderness is noted Pelvic exam: Not done  Urinalysis: RBC 11-20, WBC 3-6 bacteria few other yeast  Assessment/plan: Patient with clinical evidence of urinary tract infection. Patient will be prescribed Septra DS to take 1 by mouth twice a day for 7 days. For her yeast OB prescribe Diflucan 150 mg one by mouth today and then a second tablet after completion of her antibiotic.

## 2014-03-16 NOTE — Patient Instructions (Signed)
Urinary Tract Infection  Urinary tract infections (UTIs) can develop anywhere along your urinary tract. Your urinary tract is your body's drainage system for removing wastes and extra water. Your urinary tract includes two kidneys, two ureters, a bladder, and a urethra. Your kidneys are a pair of bean-shaped organs. Each kidney is about the size of your fist. They are located below your ribs, one on each side of your spine.  CAUSES  Infections are caused by microbes, which are microscopic organisms, including fungi, viruses, and bacteria. These organisms are so small that they can only be seen through a microscope. Bacteria are the microbes that most commonly cause UTIs.  SYMPTOMS   Symptoms of UTIs may vary by age and gender of the patient and by the location of the infection. Symptoms in young women typically include a frequent and intense urge to urinate and a painful, burning feeling in the bladder or urethra during urination. Older women and men are more likely to be tired, shaky, and weak and have muscle aches and abdominal pain. A fever may mean the infection is in your kidneys. Other symptoms of a kidney infection include pain in your back or sides below the ribs, nausea, and vomiting.  DIAGNOSIS  To diagnose a UTI, your caregiver will ask you about your symptoms. Your caregiver also will ask to provide a urine sample. The urine sample will be tested for bacteria and white blood cells. White blood cells are made by your body to help fight infection.  TREATMENT   Typically, UTIs can be treated with medication. Because most UTIs are caused by a bacterial infection, they usually can be treated with the use of antibiotics. The choice of antibiotic and length of treatment depend on your symptoms and the type of bacteria causing your infection.  HOME CARE INSTRUCTIONS   If you were prescribed antibiotics, take them exactly as your caregiver instructs you. Finish the medication even if you feel better after you  have only taken some of the medication.   Drink enough water and fluids to keep your urine clear or pale yellow.   Avoid caffeine, tea, and carbonated beverages. They tend to irritate your bladder.   Empty your bladder often. Avoid holding urine for long periods of time.   Empty your bladder before and after sexual intercourse.   After a bowel movement, women should cleanse from front to back. Use each tissue only once.  SEEK MEDICAL CARE IF:    You have back pain.   You develop a fever.   Your symptoms do not begin to resolve within 3 days.  SEEK IMMEDIATE MEDICAL CARE IF:    You have severe back pain or lower abdominal pain.   You develop chills.   You have nausea or vomiting.   You have continued burning or discomfort with urination.  MAKE SURE YOU:    Understand these instructions.   Will watch your condition.   Will get help right away if you are not doing well or get worse.  Document Released: 08/02/2005 Document Revised: 04/23/2012 Document Reviewed: 12/01/2011  [employer] Patient Information 2014 [employer]

## 2014-03-17 LAB — URINE CULTURE
Colony Count: NO GROWTH
Organism ID, Bacteria: NO GROWTH

## 2014-03-18 ENCOUNTER — Ambulatory Visit: Payer: 59 | Admitting: Internal Medicine

## 2014-03-25 ENCOUNTER — Encounter: Payer: 59 | Admitting: Women's Health

## 2014-03-27 ENCOUNTER — Encounter: Payer: 59 | Admitting: Women's Health

## 2014-04-03 ENCOUNTER — Encounter: Payer: 59 | Admitting: Women's Health

## 2014-04-03 ENCOUNTER — Encounter: Payer: Self-pay | Admitting: Internal Medicine

## 2014-04-03 ENCOUNTER — Ambulatory Visit (INDEPENDENT_AMBULATORY_CARE_PROVIDER_SITE_OTHER): Payer: 59 | Admitting: Internal Medicine

## 2014-04-03 ENCOUNTER — Ambulatory Visit (HOSPITAL_COMMUNITY)
Admission: RE | Admit: 2014-04-03 | Discharge: 2014-04-03 | Disposition: A | Discharge status: Home / Self Care | Payer: 59 | Source: Ambulatory Visit | Attending: Internal Medicine | Admitting: Internal Medicine

## 2014-04-03 VITALS — BP 110/80 | HR 81 | Temp 98.2°F | Resp 16 | Ht 64.0 in | Wt 272.0 lb

## 2014-04-03 DIAGNOSIS — R51 Headache: Secondary | ICD-10-CM | POA: Insufficient documentation

## 2014-04-03 DIAGNOSIS — R519 Headache, unspecified: Secondary | ICD-10-CM | POA: Insufficient documentation

## 2014-04-03 MED ORDER — SUMATRIPTAN-NAPROXEN SODIUM 85-500 MG PO TABS: 1.0000 | ORAL_TABLET | ORAL | Status: DC | PRN

## 2014-04-03 NOTE — Assessment & Plan Note (Signed)
She has a new and different headache with some atypical features I am concerned that she may have a CNS lesion so I have ordered a stat CT scan This may be an atypical migraine so I gave her a sample of treximet to try as well

## 2014-04-03 NOTE — Progress Notes (Signed)
Subjective:    Patient ID: Julie Mcneil, female    DOB: 1981-05-15, 33 y.o.   MRN: [id number]  Headache  This is a new problem. The current episode started yesterday. The problem occurs constantly. The problem has been unchanged. The pain is located in the right unilateral region. The pain radiates to the right neck. The pain quality is not similar to prior headaches. The quality of the pain is described as aching. The pain is at a severity of 2/10. The pain is mild. Associated symptoms include blurred vision, dizziness, nausea, neck pain (right), tingling (right arm) and weakness (right arm). Pertinent negatives include no abdominal pain, abnormal behavior, anorexia, back pain, coughing, drainage, ear pain, eye pain, eye redness, eye watering, facial sweating, fever, hearing loss, insomnia, loss of balance, muscle aches, numbness, phonophobia, photophobia, rhinorrhea, scalp tenderness, seizures, sinus pressure, sore throat, swollen glands, tinnitus, visual change, vomiting or weight loss. The symptoms are aggravated by menstrual cycle. She has tried NSAIDs for the symptoms. The treatment provided mild relief. Her past medical history is significant for obesity. There is no history of cancer, cluster headaches, hypertension, immunosuppression, migraine headaches, migraines in the family, pseudotumor cerebri, recent head traumas, sinus disease or TMJ.      Review of Systems  Constitutional: Negative for fever, chills, weight loss, diaphoresis, appetite change and fatigue.  HENT: Negative for ear pain, hearing loss, rhinorrhea, sinus pressure, sore throat and tinnitus.   Eyes: Positive for blurred vision. Negative for photophobia, pain, redness and visual disturbance.  Respiratory: Negative.  Negative for cough.   Cardiovascular: Negative.  Negative for chest pain, palpitations and leg swelling.  Gastrointestinal: Positive for nausea. Negative for vomiting, abdominal pain and anorexia.    Endocrine: Negative.   Genitourinary: Negative.   Musculoskeletal: Positive for neck pain (right). Negative for arthralgias, back pain, gait problem, joint swelling, myalgias and neck stiffness.  Skin: Negative.   Allergic/Immunologic: Negative.   Neurological: Positive for dizziness, tingling (right arm), weakness (right arm) and headaches. Negative for tremors, seizures, syncope, facial asymmetry, speech difficulty, light-headedness, numbness and loss of balance.  Hematological: Negative.  Does not bruise/bleed easily.  Psychiatric/Behavioral: Negative.  The patient does not have insomnia.        Objective:   Physical Exam  Vitals reviewed. Constitutional: She is oriented to person, place, and time. She appears well-developed and well-nourished. No distress.  HENT:  Head: Normocephalic and atraumatic.  Mouth/Throat: Oropharynx is clear and moist. No oropharyngeal exudate.  Eyes: Conjunctivae and EOM are normal. Pupils are equal, round, and reactive to light. Right eye exhibits no discharge. Left eye exhibits no discharge. No scleral icterus.  Neck: Normal range of motion. Neck supple. No JVD present. No tracheal deviation present. No thyromegaly present.  Cardiovascular: Normal rate, regular rhythm, normal heart sounds and intact distal pulses.  Exam reveals no gallop and no friction rub.   No murmur heard. Pulmonary/Chest: Effort normal and breath sounds normal. No stridor. No respiratory distress. She has no wheezes. She has no rales. She exhibits no tenderness.  Abdominal: Soft. Bowel sounds are normal. She exhibits no distension and no mass. There is no tenderness. There is no rebound and no guarding.  Musculoskeletal: Normal range of motion. She exhibits no edema and no tenderness.  Lymphadenopathy:    She has no cervical adenopathy.  Neurological: She is alert and oriented to person, place, and time. She has normal strength. She displays no atrophy, no tremor and normal reflexes.  No cranial nerve  deficit or sensory deficit. She exhibits normal muscle tone. She displays a negative Romberg sign. She displays no seizure activity. Coordination and gait normal. She displays no Babinski's sign on the right side. She displays no Babinski's sign on the left side.  Reflex Scores:      Tricep reflexes are 1+ on the right side and 1+ on the left side.      Bicep reflexes are 1+ on the right side and 1+ on the left side.      Brachioradialis reflexes are 1+ on the right side and 1+ on the left side.      Patellar reflexes are 1+ on the right side and 1+ on the left side.      Achilles reflexes are 1+ on the right side and 1+ on the left side. Skin: Skin is warm and dry. No rash noted. She is not diaphoretic. No erythema. No pallor.  Psychiatric: She has a normal mood and affect. Her behavior is normal. Judgment and thought content normal.      Lab Results  Component Value Date   WBC 11.3* 01/22/2014   HGB 12.1 01/22/2014   HCT 36.6 01/22/2014   PLT 353.0 01/22/2014   GLUCOSE 97 01/22/2014   CHOL 189 08/25/2013   TRIG 109 08/25/2013   HDL 54 08/25/2013   LDLCALC 113* 08/25/2013   ALT 16 01/22/2014   AST 16 01/22/2014   NA 139 01/22/2014   K 4.0 01/22/2014   CL 104 01/22/2014   CREATININE 0.7 01/22/2014   BUN 13 01/22/2014   CO2 24 01/22/2014   TSH 2.28 01/22/2014      Assessment & Plan:

## 2014-04-03 NOTE — Patient Instructions (Signed)
Migraine Headache A migraine headache is an intense, throbbing pain on one or both sides of your head. A migraine can last for 30 minutes to several hours. CAUSES  The exact cause of a migraine headache is not always known. However, a migraine may be caused when nerves in the brain become irritated and release chemicals that cause inflammation. This causes pain. Certain things may also trigger migraines, such as:  Alcohol.  Smoking.  Stress.  Menstruation.  Aged cheeses.  Foods or drinks that contain nitrates, glutamate, aspartame, or tyramine.  Lack of sleep.  Chocolate.  Caffeine.  Hunger.  Physical exertion.  Fatigue.  Medicines used to treat chest pain (nitroglycerine), birth control pills, estrogen, and some blood pressure medicines. SIGNS AND SYMPTOMS  Pain on one or both sides of your head.  Pulsating or throbbing pain.  Severe pain that prevents daily activities.  Pain that is aggravated by any physical activity.  Nausea, vomiting, or both.  Dizziness.  Pain with exposure to bright lights, loud noises, or activity.  General sensitivity to bright lights, loud noises, or smells. Before you get a migraine, you may get warning signs that a migraine is coming (aura). An aura may include:  Seeing flashing lights.  Seeing bright spots, halos, or zig-zag lines.  Having tunnel vision or blurred vision.  Having feelings of numbness or tingling.  Having trouble talking.  Having muscle weakness. DIAGNOSIS  A migraine headache is often diagnosed based on:  Symptoms.  Physical exam.  A CT scan or MRI of your head. These imaging tests cannot diagnose migraines, but they can help rule out other causes of headaches. TREATMENT Medicines may be given for pain and nausea. Medicines can also be given to help prevent recurrent migraines.  HOME CARE INSTRUCTIONS  Only take over-the-counter or prescription medicines for pain or discomfort as directed by your  health care provider. The use of long-term narcotics is not recommended.  Lie down in a dark, quiet room when you have a migraine.  Keep a journal to find out what may trigger your migraine headaches. For example, write down:  What you eat and drink.  How much sleep you get.  Any change to your diet or medicines.  Limit alcohol consumption.  Quit smoking if you smoke.  Get 7 9 hours of sleep, or as recommended by your health care provider.  Limit stress.  Keep lights dim if bright lights bother you and make your migraines worse. SEEK IMMEDIATE MEDICAL CARE IF:   Your migraine becomes severe.  You have a fever.  You have a stiff neck.  You have vision loss.  You have muscular weakness or loss of muscle control.  You start losing your balance or have trouble walking.  You feel faint or pass out.  You have severe symptoms that are different from your first symptoms. MAKE SURE YOU:   Understand these instructions.  Will watch your condition.  Will get help right away if you are not doing well or get worse. Document Released: 10/23/2005 Document Revised: 08/13/2013 Document Reviewed: 06/30/2013 [employer] Patient Information 2014 [employer].  

## 2014-04-03 NOTE — Progress Notes (Signed)
Pre visit review using our clinic review tool, if applicable. No additional management support is needed unless otherwise documented below in the visit note. 

## 2014-04-04 ENCOUNTER — Encounter: Payer: Self-pay | Admitting: Internal Medicine

## 2014-04-14 ENCOUNTER — Encounter: Payer: 59 | Admitting: Women's Health

## 2014-08-07 ENCOUNTER — Emergency Department (HOSPITAL_COMMUNITY)
Admission: EM | Admit: 2014-08-07 | Discharge: 2014-08-08 | Disposition: A | Discharge status: Home / Self Care | Payer: 59 | Attending: Emergency Medicine | Admitting: Emergency Medicine

## 2014-08-07 ENCOUNTER — Encounter (HOSPITAL_COMMUNITY): Payer: Self-pay | Admitting: Emergency Medicine

## 2014-08-07 DIAGNOSIS — R1012 Left upper quadrant pain: Secondary | ICD-10-CM | POA: Diagnosis not present

## 2014-08-07 DIAGNOSIS — R000 Tachycardia, unspecified: Secondary | ICD-10-CM | POA: Insufficient documentation

## 2014-08-07 DIAGNOSIS — Z87440 Personal history of urinary (tract) infections: Secondary | ICD-10-CM | POA: Diagnosis not present

## 2014-08-07 DIAGNOSIS — S39012A Strain of muscle, fascia and tendon of lower back, initial encounter: Secondary | ICD-10-CM | POA: Insufficient documentation

## 2014-08-07 DIAGNOSIS — Z8619 Personal history of other infectious and parasitic diseases: Secondary | ICD-10-CM | POA: Diagnosis not present

## 2014-08-07 DIAGNOSIS — E669 Obesity, unspecified: Secondary | ICD-10-CM | POA: Insufficient documentation

## 2014-08-07 DIAGNOSIS — Z87410 Personal history of cervical dysplasia: Secondary | ICD-10-CM | POA: Insufficient documentation

## 2014-08-07 DIAGNOSIS — Y929 Unspecified place or not applicable: Secondary | ICD-10-CM | POA: Insufficient documentation

## 2014-08-07 DIAGNOSIS — Z8659 Personal history of other mental and behavioral disorders: Secondary | ICD-10-CM | POA: Diagnosis not present

## 2014-08-07 DIAGNOSIS — Z8719 Personal history of other diseases of the digestive system: Secondary | ICD-10-CM | POA: Insufficient documentation

## 2014-08-07 DIAGNOSIS — R509 Fever, unspecified: Secondary | ICD-10-CM | POA: Diagnosis present

## 2014-08-07 DIAGNOSIS — Y939 Activity, unspecified: Secondary | ICD-10-CM | POA: Diagnosis not present

## 2014-08-07 DIAGNOSIS — X58XXXA Exposure to other specified factors, initial encounter: Secondary | ICD-10-CM | POA: Insufficient documentation

## 2014-08-07 DIAGNOSIS — J039 Acute tonsillitis, unspecified: Secondary | ICD-10-CM | POA: Diagnosis not present

## 2014-08-07 MED ORDER — ACETAMINOPHEN 500 MG PO TABS
1000.0000 mg | ORAL_TABLET | Freq: Once | ORAL | Status: AC
  Administered 2014-08-08: 1000 mg via ORAL
  Filled 2014-08-07: qty 2

## 2014-08-07 MED ORDER — GI COCKTAIL ~~LOC~~
30.0000 mL | Freq: Once | ORAL | Status: DC
  Filled 2014-08-07: qty 30

## 2014-08-07 MED ORDER — METHOCARBAMOL 500 MG PO TABS
1000.0000 mg | ORAL_TABLET | Freq: Once | ORAL | Status: DC
  Filled 2014-08-07: qty 2

## 2014-08-07 NOTE — ED Provider Notes (Signed)
CSN: [id number]     Arrival date & time 08/07/14  2223 History   First MD Initiated Contact with Patient 08/07/14 2304     Chief Complaint  Patient presents with  . Fever  . Back Pain     (Consider location/radiation/quality/duration/timing/severity/associated sxs/prior Treatment) HPI Presents with fever up to 102 today. She also complains of diffuse myalgias. She has sore throat and states she woke she woke with low back pain. Back pain is worse with movement. Denies any urinary symptoms including frequency, dysuria, hematuria or urgency. She has no nausea, vomiting or diarrhea. Recently treated for UTI with amoxicillin. No known sick contacts. Past Medical History  Diagnosis Date  . Vaginal yeast infection     recurrent  . UTI (urinary tract infection)     recurrent  . GERD (gastroesophageal reflux disease)   . Obesity   . Anxiety     Panic attacks  . Cervical dysplasia    Past Surgical History  Procedure Laterality Date  . Cesarean section  4098,1191    11-28-11  . Tubal ligation      2013 C-SECTION  . Colposcopy     Family History  Problem Relation Age of Onset  . Anesthesia problems Neg Hx   . Hypotension Neg Hx   . Malignant hyperthermia Neg Hx   . Pseudochol deficiency Neg Hx   . Early death Mother   . Heart disease Maternal Grandmother   . Diabetes Maternal Grandmother   . Cancer Maternal Grandmother     Lymphoma  . Hypertension Paternal Grandmother   . Hypertension Paternal Grandfather   . Stroke Paternal Grandmother    History  Substance Use Topics  . Smoking status: Never Smoker   . Smokeless tobacco: Never Used  . Alcohol Use: No     Comment: Rare   OB History   Grav Para Term Preterm Abortions TAB SAB Ect Mult Living   4 3 3  0 1 0 0 1 0 3     Review of Systems  Constitutional: Positive for fever, chills and fatigue.  HENT: Positive for sore throat. Negative for rhinorrhea and sinus pressure.   Respiratory: Positive for cough. Negative for  shortness of breath.   Cardiovascular: Negative for chest pain, palpitations and leg swelling.  Gastrointestinal: Negative for nausea, vomiting, abdominal pain and diarrhea.  Genitourinary: Negative for dysuria, frequency, hematuria, flank pain, vaginal bleeding, vaginal discharge, difficulty urinating and pelvic pain.  Musculoskeletal: Positive for back pain and myalgias. Negative for neck pain and neck stiffness.  Skin: Negative for rash and wound.  Neurological: Negative for dizziness, weakness, light-headedness, numbness and headaches.  All other systems reviewed and are negative.     Allergies  Nitrofurantoin monohyd macro  Home Medications   Prior to Admission medications   Medication Sig Start Date End Date Taking? Authorizing Provider  ibuprofen (ADVIL,MOTRIN) 200 MG tablet Take 600 mg by mouth every 6 (six) hours as needed for fever or mild pain.   Yes Historical Provider, MD   BP 128/79  Pulse 124  Temp(Src) 100.2 F (37.9 C) (Oral)  Resp 20  Wt 270 lb (122.471 kg)  SpO2 99%  LMP 08/06/2014 Physical Exam  Nursing note and vitals reviewed. Constitutional: She is oriented to person, place, and time. She appears well-developed and well-nourished. No distress.  HENT:  Head: Normocephalic and atraumatic.  Mouth/Throat: Oropharyngeal exudate present.  Bilateral enlarged tonsils with exudate. Uvula is midline.  Eyes: EOM are normal. Pupils are equal, round, and reactive  to light.  Neck: Normal range of motion. Neck supple.  No meningismus  Cardiovascular: Regular rhythm.   Tachycardia  Pulmonary/Chest: Effort normal and breath sounds normal. No respiratory distress. She has no wheezes. She has no rales. She exhibits no tenderness.  Abdominal: Soft. Bowel sounds are normal. She exhibits no distension and no mass. There is tenderness. There is no rebound (mild left upper quadrant tenderness with palpation. No rebound or guarding.) and no guarding.  Musculoskeletal: Normal  range of motion. She exhibits no edema and no tenderness.  No CVA tenderness bilaterally. No midline thoracic or lumbar tenderness. Mild right-sided para spinal lumbar tenderness. Bilateral negative straight-leg raise. 2+ dorsalis pedis pulses.  Neurological: She is alert and oriented to person, place, and time.  Skin: Skin is warm and dry. No rash noted. No erythema.  Psychiatric: She has a normal mood and affect. Her behavior is normal.    ED Course  Procedures (including critical care time) Labs Review Labs Reviewed  URINALYSIS, ROUTINE W REFLEX MICROSCOPIC    Imaging Review No results found.   EKG Interpretation None      MDM   Final diagnoses:  None   Patient feeling better after medication. Clinical exam consistent with tonsillitis. Urine without evidence of infection. Back pain is likely musculoskeletal. Patient's admits to using ibuprofen frequently. Left upper quadrant pain is likely mild gastritis. Advised to use Tylenol for fever and pain control. Return precautions given.     Loren Raceravid Edwinna Rochette, MD 08/08/14 [phone]

## 2014-08-07 NOTE — ED Notes (Signed)
Patient did not want to be in and out cathed.

## 2014-08-07 NOTE — ED Notes (Signed)
PT states that she began having fever, chills, and back pain this am; pt states that she just finished anbx for a UTI this am; pt states that ibuprofen helped but once it started to wear off she began to have fever and chills again

## 2014-08-08 LAB — URINE MICROSCOPIC-ADD ON

## 2014-08-08 LAB — URINALYSIS, ROUTINE W REFLEX MICROSCOPIC
Bilirubin Urine: NEGATIVE
Glucose, UA: NEGATIVE mg/dL
Ketones, ur: NEGATIVE mg/dL
Leukocytes, UA: NEGATIVE
Nitrite: NEGATIVE
Protein, ur: NEGATIVE mg/dL
Specific Gravity, Urine: 1.027 (ref 1.005–1.030)
Urobilinogen, UA: 0.2 mg/dL (ref 0.0–1.0)
pH: 5.5 (ref 5.0–8.0)

## 2014-08-08 MED ORDER — METHOCARBAMOL 500 MG PO TABS: 1000.0000 mg | ORAL_TABLET | Freq: Three times a day (TID) | ORAL | Status: DC | PRN

## 2014-08-08 MED ORDER — PENICILLIN G BENZATHINE 1200000 UNIT/2ML IM SUSP
1.2000 10*6.[IU] | Freq: Once | INTRAMUSCULAR | Status: AC
  Administered 2014-08-08: 1.2 10*6.[IU] via INTRAMUSCULAR
  Filled 2014-08-08: qty 2

## 2014-08-08 NOTE — ED Notes (Signed)
Patient does not feel she needs the robaxin and she has to drive home.

## 2014-08-08 NOTE — Discharge Instructions (Signed)
Muscle Strain A muscle strain is an injury that occurs when a muscle is stretched beyond its normal length. Usually a small number of muscle fibers are torn when this happens. Muscle strain is rated in degrees. First-degree strains have the least amount of muscle fiber tearing and pain. Second-degree and third-degree strains have increasingly more tearing and pain.  Usually, recovery from muscle strain takes 1-2 weeks. Complete healing takes 5-6 weeks.  CAUSES  Muscle strain happens when a sudden, violent force placed on a muscle stretches it too far. This may occur with lifting, sports, or a fall.  RISK FACTORS Muscle strain is especially common in athletes.  SIGNS AND SYMPTOMS At the site of the muscle strain, there may be:  Pain.  Bruising.  Swelling.  Difficulty using the muscle due to pain or lack of normal function. DIAGNOSIS  Your health care provider will perform a physical exam and ask about your medical history. TREATMENT  Often, the best treatment for a muscle strain is resting, icing, and applying cold compresses to the injured area.  HOME CARE INSTRUCTIONS   Use the PRICE method of treatment to promote muscle healing during the first 2-3 days after your injury. The PRICE method involves:  Protecting the muscle from being injured again.  Restricting your activity and resting the injured body part.  Icing your injury. To do this, put ice in a plastic bag. Place a towel between your skin and the bag. Then, apply the ice and leave it on from 15-20 minutes each hour. After the third day, switch to moist heat packs.  Apply compression to the injured area with a splint or elastic bandage. Be careful not to wrap it too tightly. This may interfere with blood circulation or increase swelling.  Elevate the injured body part above the level of your heart as often as you can.  Only take over-the-counter or prescription medicines for pain, discomfort, or fever as directed by your  health care provider.  Warming up prior to exercise helps to prevent future muscle strains. SEEK MEDICAL CARE IF:   You have increasing pain or swelling in the injured area.  You have numbness, tingling, or a significant loss of strength in the injured area. MAKE SURE YOU:   Understand these instructions.  Will watch your condition.  Will get help right away if you are not doing well or get worse. Document Released: 10/23/2005 Document Revised: 08/13/2013 Document Reviewed: 05/22/2013 [facility] Patient Information 2015 [place], [place]. This information is not intended to replace advice given to you by your health care provider. Make sure you discuss any questions you have with your health care provider.  Pharyngitis Pharyngitis is redness, pain, and swelling (inflammation) of your pharynx.  CAUSES  Pharyngitis is usually caused by infection. Most of the time, these infections are from viruses (viral) and are part of a cold. However, sometimes pharyngitis is caused by bacteria (bacterial). Pharyngitis can also be caused by allergies. Viral pharyngitis may be spread from person to person by coughing, sneezing, and personal items or utensils (cups, forks, spoons, toothbrushes). Bacterial pharyngitis may be spread from person to person by more intimate contact, such as kissing.  SIGNS AND SYMPTOMS  Symptoms of pharyngitis include:   Sore throat.   Tiredness (fatigue).   Low-grade fever.   Headache.  Joint pain and muscle aches.  Skin rashes.  Swollen lymph nodes.  Plaque-like film on throat or tonsils (often seen with bacterial pharyngitis). DIAGNOSIS  Your health care provider will ask  you questions about your illness and your symptoms. Your medical history, along with a physical exam, is often all that is needed to diagnose pharyngitis. Sometimes, a rapid strep test is done. Other lab tests may also be done, depending on the suspected cause.  TREATMENT  Viral pharyngitis  will usually get better in 3-4 days without the use of medicine. Bacterial pharyngitis is treated with medicines that kill germs (antibiotics).  HOME CARE INSTRUCTIONS   Drink enough water and fluids to keep your urine clear or pale yellow.   Only take over-the-counter or prescription medicines as directed by your health care provider:   If you are prescribed antibiotics, make sure you finish them even if you start to feel better.   Do not take aspirin.   Get lots of rest.   Gargle with 8 oz of salt water ( tsp of salt per 1 qt of water) as often as every 1-2 hours to soothe your throat.   Throat lozenges (if you are not at risk for choking) or sprays may be used to soothe your throat. SEEK MEDICAL CARE IF:   You have large, tender lumps in your neck.  You have a rash.  You cough up green, yellow-brown, or bloody spit. SEEK IMMEDIATE MEDICAL CARE IF:   Your neck becomes stiff.  You drool or are unable to swallow liquids.  You vomit or are unable to keep medicines or liquids down.  You have severe pain that does not go away with the use of recommended medicines.  You have trouble breathing (not caused by a stuffy nose). MAKE SURE YOU:   Understand these instructions.  Will watch your condition.  Will get help right away if you are not doing well or get worse. Document Released: 10/23/2005 Document Revised: 08/13/2013 Document Reviewed: 06/30/2013 [facility] Patient Information 2015 [place], [place]. This information is not intended to replace advice given to you by your health care provider. Make sure you discuss any questions you have with your health care provider.

## 2014-08-12 ENCOUNTER — Telehealth: Payer: Self-pay | Admitting: *Deleted

## 2014-08-12 NOTE — Telephone Encounter (Signed)
[facility] Triage Call Report Triage Record Num: [id number] Operator: Jean Munro Patient Name: Julie Mcneil Call Date & Time: 08/07/2014 9:46:43PM Patient Phone: [phone] PCP: Valerie Leschber Patient Gender: Female PCP Fax : [phone] Patient DOB: 10/06/1981 Practice Name: [facility] Reason for Call: Caller: [name]/Patient; PCP: Leschber, Valerie (Adults only); CB#: [phone]; Call regarding UTI, lower back pain on the right side, and fever; Onset 07/31/2014 of UTI symptoms and started on Amoxicillin. Says her symptoms did not improve through out the week. Now today developed Temp 102 this evening. Having chills and right sided back pain. Was originally seen at [facility] by Dr McComb. Triaged with Urinary Symptoms - Female and disposition of See Provider within 4 hours by "Urinary tract symptoms and fever 101.5 or higher or vomiting." Recommended that she been seen this evening. Verbalized understanding and states she will go in now. Protocol(s) Used: Urinary Symptoms - Female Recommended Outcome per Protocol: See Provider within 4 hours Reason for Outcome: Urinary tract symptoms AND fever 101.5 F (38.6 C) or higher or vomiting Care Advice: ~ SYMPTOM / CONDITION MANAGEMENT Systemic Inflammatory Response Syndrome (SIRS): Watch for signs of a generalized, whole body infection. Occurs within days of a localized infection, especially of the urinary, GI, respiratory or nervous systems; or after a traumatic injury or invasive procedure. - Call EMS 911 if symptoms have worsened, such as increasing confusion or unusual drowsiness; cold and clammy skin; no urine output; rapid respiration (>30/min.) or slow respiration (<10/min.); struggling to breathe. - Go to the ED immediately for early symptoms of rapid pulse >90/min. or rapid breathing >20/min. at rest; chills; oral temperature >100.4 F (38 C) or <96.8 F (36 C) when associated with conditions  noted. ~ Analgesic/Antipyretic Advice - NSAIDs: Consider aspirin, ibuprofen, naproxen or ketoprofen for pain or fever as directed on label or by pharmacist/provider. PRECAUTIONS: - If over 65 years of age, should not take longer than 1 week without consulting provider. EXCEPTIONS: - Should not be used if taking blood thinners or have bleeding problems. - Do not use if have history of sensitivity/allergy to any of these medications; or history of cardiovascular, ulcer, kidney, liver disease or diabetes unless approved by provider. - Do not exceed recommended dose or frequency. ~ Increase intake of fluids. Try to drink 8 oz. (.2 liter) every hour when awake, unless on restricted fluids for other medical reasons. Include at least two 8 oz. (.2 liter) glasses of unsweetened cranberry juice each day. Take sips of fluid or eat ice chips if nauseated or vomiting. ~ 08/07/2014 10:09:52PM Page 1 of 1 [email or web address]_[email or web address]_[email or web address] 

## 2014-08-22 ENCOUNTER — Telehealth: Payer: Self-pay

## 2014-08-22 ENCOUNTER — Encounter: Payer: Self-pay | Admitting: Internal Medicine

## 2014-08-22 NOTE — Telephone Encounter (Signed)
Pt had to cancel appt today because her husband was called into work and she has three little children and she did not want to bring them into the office.  Per Dr. Fabian SharpPanosh request called and spoke with pt about her diarrhea.  Pt states she has had diarrhea for about 2 weeks. Pt states her stomach does not hurt, denies fever, and blood in her stool. The last antibiotic she had was a bicillin shot on Oct. 6, 2015 and the diarrhea started a day or so after receiving the shot.  Pt states she is currently taking an OTC probitic twice a day, eating yogurt, and is watching her diet closely.  Pt's stool had started to be firm but then the diarrhea hit again.  Pt states she ate a salad yesterday and soon after she was in the restroom on and off for a few hours. Pt is going to try to come into the office on Monday for an appointment.  Pls advise if there is anything else that could be done over the weekend.

## 2014-08-22 NOTE — Telephone Encounter (Signed)
Per Dr. Fabian SharpPanosh pt can try taking Imodium and continuing the probiotic. Advised pt to be careful with the Immodium because taking too much can results in constipation. Advised pt of these recommendations and pt verbalized understanding.

## 2014-08-22 NOTE — Progress Notes (Signed)
Document opened and reviewed for sat clinic  OV but appt  canceled same day .

## 2014-08-22 NOTE — Telephone Encounter (Signed)
Also advised pt to call office first thing Monday morning to set up an appt to be evaluated.

## 2014-08-25 ENCOUNTER — Telehealth: Payer: Self-pay | Admitting: *Deleted

## 2014-08-25 NOTE — Telephone Encounter (Signed)
[facility] Triage Call Report Triage Record Num: [id number] Operator: Donnella Shamammy Childress Patient Name: Julie Mcneil Call Date & Time: 08/21/2014 5:24:13PM Patient Phone: [phone] PCP: Rene PaciValerie Leschber Patient Gender: Female PCP Fax : [phone] Patient DOB: 10/22/1981 [facility] Reason for Call: Caller: Tiney/Patient; PCP: Rene PaciLeschber, Valerie (Adults only); CB#: [phone]; Call regarding Diarrhea; onset 08/09/14 after getting a PCN shot in the ED 10/3 for strep; having 5-10 episodes daily; denies vomiting; afebrile; All emergent sxs of Diarrhea or Other Change in Bowel Habits protocol r/o except "diarrhea began after starting an abx"; disp see within 24hrs; appt scheduled for 10/17 at 1030 with Dr.Panosh Protocol(s) Used: Diarrhea or Other Change in Bowel Habits Recommended Outcome per Protocol: See Provider within 24 hours Reason for Outcome: Diarrhea began 3-5 days after starting an antibiotic Care Advice: ~ SYMPTOM / CONDITION MANAGEMENT Diarrheal Care: - Drink 2-3 quarts (2-3 liters) per day of low sugar content fluids, including over the counter oral hydration solution, unless directed otherwise by provider. - If accompanied by vomiting, take the fluids in frequent small sips or suck on ice chips. - Eat easily digested foods (such as bananas, rice, applesauce, toast, cooked cereals, soup, crackers, baked or boiled potato, or baked chicken or [place]key without skin). - Do not eat high fiber, high fat, high sugar content foods, or highly seasoned foods. - Do not drink caffeinated or alcoholic beverages. - Avoid milk and milk products while having symptoms. As symptoms improve, gradually add back to diet. - Application of A&D ointment or witch hazel medicated pads may help anal irritation. - Antidiarrheal medications are usually unnecessary. If symptoms are severe, consider nonprescription antidiarrheal and anti-motility drugs as directed by label or a  provider. Do not take if have high fever or bloody diarrhea. If pregnant, do not take any medications not approved by your provider. - Consult your provider for advice regarding continuing prescription medication. ~ Consider eating yogurts or other foods that contain probiotics, especially if you have had antibiotic associated diarrhea before. Ask your provider if probiotic supplements could help you before you begin to take the supplements. ~ Go to the ED if you have developed signs and symptoms of dehydration such as very dry mouth and tongue; increased pulse rate at rest; no urine output for 12 hours or more; increasing weakness or drowsiness, or lightheadedness when trying to sit upright or standing. ~ 08/21/2014 5:35:12PM Page 1 of 1 CAN_[email or web address]_V2

## 2014-09-07 ENCOUNTER — Encounter (HOSPITAL_COMMUNITY): Payer: Self-pay | Admitting: Emergency Medicine

## 2014-11-16 ENCOUNTER — Other Ambulatory Visit (INDEPENDENT_AMBULATORY_CARE_PROVIDER_SITE_OTHER): Payer: 59

## 2014-11-16 ENCOUNTER — Ambulatory Visit (INDEPENDENT_AMBULATORY_CARE_PROVIDER_SITE_OTHER): Payer: 59 | Admitting: Internal Medicine

## 2014-11-16 ENCOUNTER — Encounter: Payer: Self-pay | Admitting: Internal Medicine

## 2014-11-16 VITALS — BP 112/72 | HR 76 | Temp 98.0°F | Resp 16 | Ht 64.0 in | Wt 276.0 lb

## 2014-11-16 DIAGNOSIS — R109 Unspecified abdominal pain: Secondary | ICD-10-CM

## 2014-11-16 DIAGNOSIS — K30 Functional dyspepsia: Secondary | ICD-10-CM

## 2014-11-16 LAB — COMPREHENSIVE METABOLIC PANEL
ALT: 18 U/L (ref 0–35)
AST: 17 U/L (ref 0–37)
Albumin: 3.9 g/dL (ref 3.5–5.2)
Alkaline Phosphatase: 91 U/L (ref 39–117)
BUN: 13 mg/dL (ref 6–23)
CO2: 26 mEq/L (ref 19–32)
Calcium: 8.9 mg/dL (ref 8.4–10.5)
Chloride: 105 mEq/L (ref 96–112)
Creatinine, Ser: 0.8 mg/dL (ref 0.4–1.2)
GFR: 94.53 mL/min (ref >60.00)
Glucose, Bld: 110 mg/dL — ABNORMAL HIGH (ref 70–99)
Potassium: 4.1 mEq/L (ref 3.5–5.1)
Sodium: 138 mEq/L (ref 135–145)
Total Bilirubin: 0.9 mg/dL (ref 0.2–1.2)
Total Protein: 7.5 g/dL (ref 6.0–8.3)

## 2014-11-16 LAB — LIPASE: Lipase: 26 U/L (ref 11.0–59.0)

## 2014-11-16 LAB — HEMOGLOBIN A1C: Hgb A1c MFr Bld: 5.9 % (ref 4.6–6.5)

## 2014-11-16 NOTE — Patient Instructions (Signed)
We will check your blood work today to make sure we can not missing anything with the swimminghead feeling or the stomach.   We will have you try maalox or tums when you get the pain in your stomach. If it works then likely it is coming from acid reflex or heartburn. If it does not work then we can look for other causes.   We will call you back with the lab results and they will be on my chart as well.   Please call back with any problems or questions.   Heartburn Heartburn is a painful, burning sensation in the chest. It may feel worse in certain positions, such as lying down or bending over. It is caused by stomach acid backing up into the tube that carries food from the mouth down to the stomach (lower esophagus).  CAUSES   Large meals.  Certain foods and drinks.  Exercise.  Increased acid production.  Being overweight or obese.  Certain medicines. SYMPTOMS   Burning pain in the chest or lower throat.  Bitter taste in the mouth.  Coughing. DIAGNOSIS  If the usual treatments for heartburn do not improve your symptoms, then tests may be done to see if there is another condition present. Possible tests may include:  X-rays.  Endoscopy. This is when a tube with a light and a camera on the end is used to examine the esophagus and the stomach.  A test to measure the amount of acid in the esophagus (pH test).  A test to see if the esophagus is working properly (esophageal manometry).  Blood, breath, or stool tests to check for bacteria that cause ulcers. TREATMENT   Your caregiver may tell you to use certain over-the-counter medicines (antacids, acid reducers) for mild heartburn.  Your caregiver may prescribe medicines to decrease the acid in your stomach or protect your stomach lining.  Your caregiver may recommend certain diet changes.  For severe cases, your caregiver may recommend that the head of your bed be elevated on blocks. (Sleeping with more pillows is not an  effective treatment as it only changes the position of your head and does not improve the main problem of stomach acid refluxing into the esophagus.) HOME CARE INSTRUCTIONS   Take all medicines as directed by your caregiver.  Raise the head of your bed by putting blocks under the legs if instructed to by your caregiver.  Do not exercise right after eating.  Avoid eating 2 or 3 hours before bed. Do not lie down right after eating.  Eat small meals throughout the day instead of 3 large meals.  Stop smoking if you smoke.  Maintain a healthy weight.  Identify foods and beverages that make your symptoms worse and avoid them. Foods you may want to avoid include:  Peppers.  Chocolate.  High-fat foods, including fried foods.  Spicy foods.  Garlic and onions.  Citrus fruits, including oranges, grapefruit, lemons, and limes.  Food containing tomatoes or tomato products.  Mint.  Carbonated drinks, caffeinated drinks, and alcohol.  Vinegar. SEEK IMMEDIATE MEDICAL CARE IF:  You have severe chest pain that goes down your arm or into your jaw or neck.  You feel sweaty, dizzy, or lightheaded.  You are short of breath.  You vomit blood.  You have difficulty or pain with swallowing.  You have bloody or black, tarry stools.  You have episodes of heartburn more than 3 times a week for more than 2 weeks. MAKE SURE YOU:  Understand  these instructions.  Will watch your condition.  Will get help right away if you are not doing well or get worse. Document Released: 03/11/2009 Document Revised: 01/15/2012 Document Reviewed: 04/09/2011 [facility] Patient Information 2015 [place], [place]. This information is not intended to replace advice given to you by your health care provider. Make sure you discuss any questions you have with your health care provider.

## 2014-11-16 NOTE — Progress Notes (Signed)
Pre visit review using our clinic review tool, if applicable. No additional management support is needed unless otherwise documented below in the visit note. 

## 2014-11-16 NOTE — Progress Notes (Signed)
   Subjective:    Patient ID: Julie Mcneil, female    DOB: 04/20/1981, 34 y.o.   MRN: [id number]  HPI The patient is a 34 YO female who is coming in today for an acute visit for flank pain. She notices the pain in her left upper abdomen. She notices that eating out or fried foods seem to bring it on. Generally they eat healthy at home and she does not notice it. It can last for minutes to hours once it starts. It starts more intense but then gets gradually less until it is gone. She denies nausea, vomiting, diarrhea, constipation associated with it. She has not tried anything except tylenol for the pain which helps some.   Review of Systems  Constitutional: Negative for fever, activity change, appetite change, fatigue and unexpected weight change.  HENT: Negative.   Respiratory: Negative for cough, chest tightness, shortness of breath and wheezing.   Cardiovascular: Negative for chest pain, palpitations and leg swelling.  Gastrointestinal: Negative for nausea, abdominal pain, diarrhea, constipation and abdominal distention.  Genitourinary: Positive for flank pain.  Musculoskeletal: Negative.   Neurological: Negative.       Objective:   Physical Exam  Constitutional: She is oriented to person, place, and time. She appears well-developed and well-nourished.  HENT:  Head: Normocephalic and atraumatic.  Eyes: EOM are normal.  Neck: Normal range of motion.  Cardiovascular: Normal rate and regular rhythm.   Pulmonary/Chest: Effort normal and breath sounds normal.  Abdominal: Soft. Bowel sounds are normal. She exhibits no distension. There is no tenderness. There is no rebound.  Musculoskeletal: She exhibits no edema.  Neurological: She is alert and oriented to person, place, and time.  Skin: Skin is warm and dry.   Filed Vitals:   11/16/14 0816  BP: 112/72  Pulse: 76  Temp: 98 F (36.7 C)  TempSrc: Oral  Resp: 16  Height: 5\' 4"  (1.626 m)  Weight: 276 lb (125.193 kg)  SpO2: 98%        Assessment & Plan:

## 2014-11-17 DIAGNOSIS — R109 Unspecified abdominal pain: Secondary | ICD-10-CM | POA: Insufficient documentation

## 2014-11-17 NOTE — Assessment & Plan Note (Signed)
Mostly left upper quadrant. Will trial maalox or tums for the pain. If effective could be coming from GERD. If no relief can check abdominal US to check for gallstones given association with fried and fatty foods. If GERD can trial PPI.

## 2014-11-18 ENCOUNTER — Telehealth: Payer: Self-pay | Admitting: Internal Medicine

## 2014-11-18 ENCOUNTER — Encounter: Payer: Self-pay | Admitting: Internal Medicine

## 2014-11-18 MED ORDER — MECLIZINE HCL 25 MG PO TABS: 25.0000 mg | ORAL_TABLET | Freq: Two times a day (BID) | ORAL | Status: DC | PRN

## 2014-11-18 NOTE — Telephone Encounter (Signed)
Patient returning your call.

## 2014-11-18 NOTE — Telephone Encounter (Signed)
Can you please call and relay her lab information. Please let her know we can send in a medicine to try for the dizziness called meclizine. If this is not effective we can send her to someone to try to find out about her dizziness. Unfortunately we did spend most of the time for the acute complaint she had with the pain in her stomach that seemed to be the biggest concern at the visit. I apologize if she feels like we wasted her time.

## 2014-11-19 ENCOUNTER — Encounter: Payer: Self-pay | Admitting: Women's Health

## 2014-11-19 ENCOUNTER — Ambulatory Visit (INDEPENDENT_AMBULATORY_CARE_PROVIDER_SITE_OTHER): Payer: 59 | Admitting: Women's Health

## 2014-11-19 VITALS — BP 120/82 | Ht 64.0 in | Wt 274.0 lb

## 2014-11-19 DIAGNOSIS — R350 Frequency of micturition: Secondary | ICD-10-CM

## 2014-11-19 DIAGNOSIS — N898 Other specified noninflammatory disorders of vagina: Secondary | ICD-10-CM

## 2014-11-19 DIAGNOSIS — N3001 Acute cystitis with hematuria: Secondary | ICD-10-CM

## 2014-11-19 LAB — URINALYSIS W MICROSCOPIC + REFLEX CULTURE
Bilirubin Urine: NEGATIVE
Casts: NONE SEEN
Crystals: NONE SEEN
Glucose, UA: NEGATIVE mg/dL
Ketones, ur: NEGATIVE mg/dL
Leukocytes, UA: NEGATIVE
Nitrite: NEGATIVE
Protein, ur: NEGATIVE mg/dL
Specific Gravity, Urine: 1.025 (ref 1.005–1.030)
Urobilinogen, UA: 0.2 mg/dL (ref 0.0–1.0)
WBC, UA: NONE SEEN WBC/hpf (ref <3)
pH: 5.5 (ref 5.0–8.0)

## 2014-11-19 LAB — WET PREP FOR TRICH, YEAST, CLUE
Clue Cells Wet Prep HPF POC: NONE SEEN
Trich, Wet Prep: NONE SEEN
WBC, Wet Prep HPF POC: NONE SEEN
Yeast Wet Prep HPF POC: NONE SEEN

## 2014-11-19 MED ORDER — SULFAMETHOXAZOLE-TRIMETHOPRIM 800-160 MG PO TABS: 1.0000 | ORAL_TABLET | Freq: Two times a day (BID) | ORAL | Status: DC

## 2014-11-19 NOTE — Progress Notes (Signed)
Presents with numerous vague complaints. States urine has a strange odor, has had increased urinary frequency with urgency mild pain and burning for 2 weeks. Denies vaginal discharge. States has had a mild headache, occasional dizziness, vague generalized achiness. Has 3 children 1 recently recovered from pneumonia, one currently with a UTI and has had poor sleep in the past month. Was seen at primary care several days ago with normal labs. Monthly cycle/BTL.  Exam: Appears well, obese. External genitalia within normal limits, speculum exam scant discharge, wet prep negative. Bimanual limited due to obesity. Mild tenderness at suprapubic area. No adnexal tenderness. UA: Moderate blood, no wbc's, 11-20 rbc's, many bacteria.  Probable UTI Possible viral illness  Plan: Septra DS twice daily for 3 days prescription, proper use given and reviewed. Instructed to call if no relief of symptoms. Increase rest, keep scheduled annual exam office visit in 2 weeks. Urine culture pending.

## 2014-11-19 NOTE — Patient Instructions (Signed)

## 2014-11-22 LAB — URINE CULTURE: Colony Count: >=100000

## 2014-12-07 ENCOUNTER — Encounter: Payer: Self-pay | Admitting: Women's Health

## 2014-12-11 ENCOUNTER — Telehealth: Payer: Self-pay | Admitting: *Deleted

## 2014-12-11 NOTE — Telephone Encounter (Signed)
Pt called requesting Rx for sinus infection asked if nancy could fill for her. Pt does have a PCP I explained to patient nancy is not here today  And best to call PCP. Pt will do this,.

## 2014-12-28 ENCOUNTER — Encounter: Payer: Self-pay | Admitting: Nurse Practitioner

## 2014-12-28 ENCOUNTER — Other Ambulatory Visit: Payer: Self-pay | Admitting: Nurse Practitioner

## 2014-12-28 ENCOUNTER — Ambulatory Visit (INDEPENDENT_AMBULATORY_CARE_PROVIDER_SITE_OTHER): Payer: 59 | Admitting: Nurse Practitioner

## 2014-12-28 VITALS — BP 122/70 | HR 72 | Temp 98.8°F | Ht 64.0 in | Wt 277.0 lb

## 2014-12-28 DIAGNOSIS — Z6839 Body mass index (BMI) 39.0-39.9, adult: Secondary | ICD-10-CM | POA: Insufficient documentation

## 2014-12-28 DIAGNOSIS — R8290 Unspecified abnormal findings in urine: Secondary | ICD-10-CM | POA: Insufficient documentation

## 2014-12-28 DIAGNOSIS — Z6841 Body Mass Index (BMI) 40.0 and over, adult: Secondary | ICD-10-CM | POA: Insufficient documentation

## 2014-12-28 LAB — POCT URINALYSIS DIPSTICK
Bilirubin, UA: NEGATIVE
Blood, UA: POSITIVE
Glucose, UA: NEGATIVE
Ketones, UA: NEGATIVE
Leukocytes, UA: NEGATIVE
Nitrite, UA: NEGATIVE
Protein, UA: 15
Spec Grav, UA: 1.015
Urobilinogen, UA: 0.2
pH, UA: 7.5

## 2014-12-28 MED ORDER — SULFAMETHOXAZOLE-TRIMETHOPRIM 800-160 MG PO TABS: 1.0000 | ORAL_TABLET | Freq: Two times a day (BID) | ORAL | Status: DC

## 2014-12-28 NOTE — Progress Notes (Signed)
Pre visit review using our clinic review tool, if applicable. No additional management support is needed unless otherwise documented below in the visit note. 

## 2014-12-28 NOTE — Patient Instructions (Signed)
Start antibiotic. Our office will call if we need to change the antibiotic.  Sip hydrating fluids (water, juice, colorless soda, decaff tea) every hour to flush kidneys.  Return in 1 month to recheck urine or sooner if symptoms do not improve or you feel worse.  To decrease risk for diabetes,  Cut out refined sugar:anything that is sweet when you eat or drink it except fresh fruit. Cut out refined grains: white bread, rolls, biscuits, bagels, muffins, pasta and cereals. Breads & cereals that have 4 gm or more of fiber per serving are good. Whole wheat pasta, brown rice and quinoa are good. Develop lifelong habits of exercise most days of the week: take a 30 minute walk. The benefits include weight loss, lower risk for heart disease, diabetes, stroke, high blood pressure, lower rates of depression & dementia, better sleep quality & bone health. Eat fruits, vegetables, nuts, seeds, whole grains, meat & eggs 3-4 times week. Cut out cheese or limit to 1-2  Times week. Cut out or limit alcoholic beverages for weight loss. If you do these things, you will loose weight.   Urinary Tract Infection Urinary tract infections (UTIs) can develop anywhere along your urinary tract. Your urinary tract is your body's drainage system for removing wastes and extra water. Your urinary tract includes two kidneys, two ureters, a bladder, and a urethra. Your kidneys are a pair of bean-shaped organs. Each kidney is about the size of your fist. They are located below your ribs, one on each side of your spine. CAUSES Infections are caused by microbes, which are microscopic organisms, including fungi, viruses, and bacteria. These organisms are so small that they can only be seen through a microscope. Bacteria are the microbes that most commonly cause UTIs. SYMPTOMS  Symptoms of UTIs may vary by age and gender of the patient and by the location of the infection. Symptoms in young women typically include a frequent and intense  urge to urinate and a painful, burning feeling in the bladder or urethra during urination. Older women and men are more likely to be tired, shaky, and weak and have muscle aches and abdominal pain. A fever may mean the infection is in your kidneys. Other symptoms of a kidney infection include pain in your back or sides below the ribs, nausea, and vomiting. DIAGNOSIS To diagnose a UTI, your caregiver will ask you about your symptoms. Your caregiver also will ask to provide a urine sample. The urine sample will be tested for bacteria and white blood cells. White blood cells are made by your body to help fight infection. TREATMENT  Typically, UTIs can be treated with medication. Because most UTIs are caused by a bacterial infection, they usually can be treated with the use of antibiotics. The choice of antibiotic and length of treatment depend on your symptoms and the type of bacteria causing your infection. HOME CARE INSTRUCTIONS  If you were prescribed antibiotics, take them exactly as your caregiver instructs you. Finish the medication even if you feel better after you have only taken some of the medication.  Drink enough water and fluids to keep your urine clear or pale yellow.  Avoid caffeine, tea, and carbonated beverages. They tend to irritate your bladder.  Empty your bladder often. Avoid holding urine for long periods of time.  Empty your bladder before and after sexual intercourse.  After a bowel movement, women should cleanse from front to back. Use each tissue only once. SEEK MEDICAL CARE IF:   You have  back pain.  You develop a fever.  Your symptoms do not begin to resolve within 3 days. SEEK IMMEDIATE MEDICAL CARE IF:   You have severe back pain or lower abdominal pain.  You develop chills.  You have nausea or vomiting.  You have continued burning or discomfort with urination. MAKE SURE YOU:   Understand these instructions.  Will watch your condition.  Will get help  right away if you are not doing well or get worse. Document Released: 08/02/2005 Document Revised: 04/23/2012 Document Reviewed: 12/01/2011 [facility] Patient Information 2014 [place], [place].

## 2014-12-28 NOTE — Progress Notes (Signed)
   Subjective:    Patient ID: Julie SaneElizabeth A Burkhammer, female    DOB: 04/04/1981, 34 y.o.   MRN: [id number]  HPI Comments: Last A1C 5.9, recent. Pt states before she had children she had a UTI nearly every month. Then none for 6 yrs. Last 3 yrs, UTI have started again-about 3/yr.  Pt asking how to lose weight-advised she is at increased risk for developing DM. Discussed specific diet & activity changes.  Urinary Tract Infection  This is a new problem. The current episode started 1 to 4 weeks ago (2 wks). The problem occurs intermittently. The problem has been waxing and waning. The patient is experiencing no pain. There has been no fever. She is sexually active. There is no history of pyelonephritis. Associated symptoms include urgency. Pertinent negatives include no chills, discharge, flank pain, frequency, hesitancy, nausea or vomiting. Associated symptoms comments: Malodorous urine. She has tried nothing for the symptoms. treated for UTI 6 wks ago  y gynecology. Reviewed note: urine cx pos for e coli, senstv to bactrim. Pt was treated w/bactrim X 3 d. got bettr for 2-3 weeks, then symptoms returned.      Review of Systems  Constitutional: Negative for chills.  Gastrointestinal: Positive for abdominal pain (suprapubic pain). Negative for nausea and vomiting.  Genitourinary: Positive for urgency. Negative for hesitancy, frequency and flank pain.       Objective:   Physical Exam  Constitutional: She is oriented to person, place, and time. She appears well-developed and well-nourished. No distress.  HENT:  Head: Normocephalic and atraumatic.  Eyes: Conjunctivae are normal. Right eye exhibits no discharge. Left eye exhibits no discharge.  Cardiovascular: Normal rate.   Pulmonary/Chest: Effort normal. No respiratory distress.  Abdominal: Soft. She exhibits no distension and no mass. There is tenderness (LLQ). There is no rebound and no guarding.  Obese   Neurological: She is alert and oriented  to person, place, and time.  Skin: Skin is warm and dry.  Psychiatric: She has a normal mood and affect. Her behavior is normal. Thought content normal.  Vitals reviewed.         Assessment & Plan:  1. Abnormal urine odor - Urine culture - POCT urinalysis dipstick-pos blood - sulfamethoxazole-trimethoprim (BACTRIM DS) 800-160 MG per tablet; Take 1 tablet by mouth 2 (two) times daily.  Dispense: 6 tablet; Refill: 0  2. Severe obesity (BMI >= 40) 30 min walk daily Cut out alcohol, refined sugar & grains Limit Meat, eggs, cheese to 3 times/week All grains should have 4 gm fiber or more per serving Unlimited fruits & veges, handful nuts/seeds daily  F/u 4 wks-check urine.

## 2014-12-30 LAB — URINE CULTURE: Colony Count: >=100000

## 2014-12-31 ENCOUNTER — Telehealth: Payer: Self-pay | Admitting: Nurse Practitioner

## 2014-12-31 NOTE — Telephone Encounter (Signed)
Called pt with results. Mailbox full, unable to leave message.

## 2014-12-31 NOTE — Telephone Encounter (Signed)
pls call pt: Ask if feeling better. 

## 2015-01-04 NOTE — Telephone Encounter (Signed)
Called pt with results. Mailbox full, unable to leave message. 2nd attempt.

## 2015-01-04 NOTE — Telephone Encounter (Signed)
Finally got a hold of pt. Patient stated that she did not take antibiotic that was prescribed. Per Layne, pt should start the antibiotic.

## 2015-01-06 ENCOUNTER — Telehealth: Payer: Self-pay | Admitting: *Deleted

## 2015-01-06 MED ORDER — NYSTATIN 100000 UNIT/GM EX CREA: 1.0000 "application " | TOPICAL_CREAM | Freq: Two times a day (BID) | CUTANEOUS | Status: DC

## 2015-01-06 NOTE — Telephone Encounter (Signed)
Pt called c/o external yeast in groin area, itching and red, irritation, had UTI treated at PCP.  Pt said you prescribed nystatin cream in past and this cleared it up. Pt asked if she could have Rx for this.

## 2015-01-06 NOTE — Telephone Encounter (Signed)
Pt informed, rx sent 

## 2015-01-06 NOTE — Telephone Encounter (Signed)
Yes, please call in refill for nystatin. Office visit if no relief.

## 2015-01-19 ENCOUNTER — Telehealth: Payer: Self-pay | Admitting: Internal Medicine

## 2015-01-19 DIAGNOSIS — R899 Unspecified abnormal finding in specimens from other organs, systems and tissues: Secondary | ICD-10-CM

## 2015-01-19 NOTE — Telephone Encounter (Signed)
Patient called [facility] stating that she began taking sulfamethoxazole-trimethoprim (BACTRIM DS) 800-160 MG per tablet [[id number]][[id number]] last weekend. Ever since she started taking the meds, she has 30 minutes of "bone shattering" chills. [facility] advised we may need to change RX.

## 2015-01-19 NOTE — Telephone Encounter (Signed)
[facility] - Client TELEPHONE ADVICE RECORD [facility]  Patient Name: Julie Mcneil  DOB: 04/10/1981    Initial Comment caller states she woke up this morning with extreme chills and nauseous she thinks it might be a reaction to her antibiotic    Nurse Assessment  Nurse: Laural BenesJohnson, RN, Dondra SpryGail Date/Time Lamount Cohen(Eastern Time): 01/19/2015 4:08:28 PM  Confirm and document reason for call. If symptomatic, describe symptoms. ---Julie Mcneil is taking an antibiotic called bactrim DS and woke up with this 3am shaking all over lasting 30 minutes nauseated -- got up took another dose of bactrim DS generic and started with shaking all over and nausea persists.  Has the patient traveled out of the country within the last 30 days? ---No  Does the patient require triage? ---Yes  Related visit to physician within the last 2 weeks? ---Yes   MD office 2 weeks and started antibiotics this past weekend UTI  Does the PT have any chronic conditions? (i.e. diabetes, asthma, etc.) ---Unknown  Did the patient indicate they were pregnant? ---No     Guidelines    Guideline Title Affirmed Question Affirmed Notes  Nausea Taking prescription medication that could cause nausea (e.g., narcotics/opiates, antibiotics, OCPs, many others)    Final Disposition User   Call PCP within 24 Hours Olmit[place]Johnson, RN, [place]    Comments  Patient having side effects of antibiotic she was put on for UTI -started it this weekend; has has teeth chattering chills lasting for 30 minutes and nausea that persists after chills subside; happens every time she takes a dose of medication body is sore from the chills -- advised to hold the night dose and nurse will see if MD wants to change the medication or have her come in. Called back line spoke with Fun[place]ustin and gave information.

## 2015-01-20 ENCOUNTER — Telehealth: Payer: Self-pay | Admitting: *Deleted

## 2015-01-20 NOTE — Telephone Encounter (Signed)
Called pt and informed that triage note was sent to Dr. Yetta BarreJones for review and advisement.

## 2015-01-20 NOTE — Telephone Encounter (Signed)
[facility] - Client TELEPHONE ADVICE RECORD [facility] Patient Name: Julie Mcneil Klare Gender: Female DOB: 02/27/1981 Age: [age] Y 3 M 10 D Return Phone Number: [phone] (Primary) Address: [address] City[place]State[place]Zip: [place] [address] Client [facility] - Client Client Site [facility] Contact Type Call Call Type Triage / Clinical Relationship To Patient Self Appointment Disposition EMR Appointment Attempted - Not Scheduled Info pasted into Epic Yes Return Phone Number [phone] (Primary) Chief Complaint Nausea Initial Comment caller states she woke up this morning with extreme chills and nauseous she thinks it might be a reaction to her antibiotic PreDisposition Call Doctor Nurse Assessment Nurse: Laural BenesJohnson, RN, Dondra SpryGail Date/Time Lamount Cohen(Eastern Time): 01/19/2015 4:08:28 PM Confirm and document reason for call. If symptomatic, describe symptoms. ---Lanora Manislizabeth is taking an antibiotic called bactrim DS and woke up with this 3am shaking all over lasting 30 minutes nauseated -- got up took another dose of bactrim DS generic and started with shaking all over and nausea persists. Has the patient traveled out of the country within the last 30 days? ---No Does the patient require triage? ---Yes Related visit to physician within the last 2 weeks? ---Yes MD office 2 weeks and started antibiotics this past weekend UTI Does the PT have any chronic conditions? (i.e. diabetes, asthma, etc.) ---Unknown Did the patient indicate they were pregnant? ---No Guidelines Guideline Title Affirmed Question Affirmed Notes Nurse Date/Time (Eastern Time) Nausea Taking prescription medication that could cause nausea (e.g., narcotics/opiates, antibiotics, OCPs, many others) Laural BenesJohnson, Teacher, adult educationN, Gail 01/19/2015 4:15:19 PM Disp. Time Lamount Cohen(Eastern Time) Disposition Final User 01/19/2015 4:16:45 PM Call PCP within 24 Hours Yes Laural BenesJohnson, Charity fundraiserN,  Dondra SpryGail

## 2015-01-20 NOTE — Telephone Encounter (Signed)
Pt came in for urine odor on 12/28/14. Abx rx. Pt did not start until 2 weeks later. Culture came back but no susceptibility testing indicated in notes. Triage notes imply that there is a possible allergy to abx. Will communicate any instructions to pt.

## 2015-01-20 NOTE — Telephone Encounter (Signed)
None of this makes any sense to me

## 2015-01-21 NOTE — Telephone Encounter (Signed)
Spoke to Dr. Yetta BarreJones: per conversation, he has ordered another specimen to be collected. This one needs to be a clean change. Informed that there were foreign vaginal bacteria collected in the last specimen.

## 2015-01-21 NOTE — Telephone Encounter (Signed)
Called patient.  Informed her to stop antibiotic per Dr. Yetta BarreJones and return to lab for a clean catch urine.  Order dispatched.

## 2015-01-21 NOTE — Telephone Encounter (Signed)
Orders have been entered per verbal via Dr. Yetta BarreJones.  Pt has been informed to dc abx and to leave another sample that is clean catch for accurate results.

## 2015-01-25 ENCOUNTER — Encounter: Payer: Self-pay | Admitting: Internal Medicine

## 2015-02-01 ENCOUNTER — Encounter: Payer: Self-pay | Admitting: Internal Medicine

## 2015-02-15 ENCOUNTER — Encounter: Payer: Self-pay | Admitting: Family

## 2015-02-15 ENCOUNTER — Ambulatory Visit (INDEPENDENT_AMBULATORY_CARE_PROVIDER_SITE_OTHER): Payer: 59 | Admitting: Family

## 2015-02-15 VITALS — BP 118/90 | HR 81 | Temp 98.6°F | Resp 18 | Ht 64.0 in | Wt 271.1 lb

## 2015-02-15 DIAGNOSIS — Z0000 Encounter for general adult medical examination without abnormal findings: Secondary | ICD-10-CM | POA: Diagnosis not present

## 2015-02-15 DIAGNOSIS — Z0001 Encounter for general adult medical examination with abnormal findings: Secondary | ICD-10-CM | POA: Insufficient documentation

## 2015-02-15 NOTE — Progress Notes (Signed)
Subjective:    Patient ID: Julie Mcneil, female    DOB: 07/08/1981, 34 y.o.   MRN: [id number]  Chief Complaint  Patient presents with  . CPE    Fasting    HPI:  Julie Sanelizabeth A Virgo is a 34 y.o. female who presents today for an annual wellness visit.   1) Health Maintenance -   Diet - Averages about 2-3 meals per day; whole grains, fruits, vegetables, and some meats; eats lots of whole wheat pasta; 2 cups of caffeine per day.   Exercise - Walks 15-30 minutes per daily   2) Preventative Exams / Immunizations:  Dental -- Up to date  Vision -- Up to date    Health Maintenance  Topic Date Due  . INFLUENZA VACCINE  11/17/2015 (Originally 06/07/2015)  . PAP SMEAR  03/24/2016  . TETANUS/TDAP  08/06/2021  . HIV Screening  Completed    Immunization History  Administered Date(s) Administered  . HPV Quadrivalent 02/05/2008  . Influenza,inj,Quad PF,36+ Mos 08/25/2013  . Tdap 08/07/2011    Allergies  Allergen Reactions  . Nitrofurantoin Monohyd Macro Hives and Nausea And Vomiting    Current Outpatient Prescriptions on File Prior to Visit  Medication Sig Dispense Refill  . ibuprofen (ADVIL,MOTRIN) 200 MG tablet Take 600 mg by mouth every 6 (six) hours as needed for fever or mild pain.     No current facility-administered medications on file prior to visit.    Past Medical History  Diagnosis Date  . Vaginal yeast infection     recurrent  . UTI (urinary tract infection)     recurrent  . GERD (gastroesophageal reflux disease)   . Obesity   . Anxiety     Panic attacks  . Cervical dysplasia     Past Surgical History  Procedure Laterality Date  . Cesarean section  [id number],2011    11-28-11  . Tubal ligation      2013 C-SECTION  . Colposcopy      Family History  Problem Relation Age of Onset  . Anesthesia problems Neg Hx   . Hypotension Neg Hx   . Malignant hyperthermia Neg Hx   . Pseudochol deficiency Neg Hx   . Early death Mother   . Heart disease Maternal  Grandmother   . Diabetes Maternal Grandmother   . Cancer Maternal Grandmother     Lymphoma  . Hypertension Paternal Grandmother   . Hypertension Paternal Grandfather   . Stroke Paternal Grandmother     History   Social History  . Marital Status: Married    Spouse Name: N/A  . Number of Children: N/A  . Years of Education: N/A   Occupational History  . Not on file.   Social History Main Topics  . Smoking status: Never Smoker   . Smokeless tobacco: Never Used  . Alcohol Use: No  . Drug Use: No  . Sexual Activity: Yes    Birth Control/ Protection: Surgical     Comment: TUBAL LIGATION   Other Topics Concern  . Not on file   Social History Narrative    Review of Systems  Constitutional: Denies fever, chills, fatigue, or significant weight gain/loss. HENT: Head: Denies headache or neck pain Ears: Denies changes in hearing, ringing in ears, earache, drainage Nose: Denies discharge, stuffiness, itching, nosebleed, sinus pain Throat: Denies sore throat, hoarseness, dry mouth, sores, thrush Eyes: Denies loss/changes in vision, pain, redness, blurry/double vision, flashing lights Cardiovascular: Denies chest pain/discomfort, tightness, palpitations, shortness of breath with activity, difficulty  lying down, swelling, sudden awakening with shortness of breath Respiratory: Denies shortness of breath, cough, sputum production, wheezing Gastrointestinal: Denies dysphasia, heartburn, change in appetite, nausea, change in bowel habits, rectal bleeding, constipation, diarrhea, yellow skin or eyes Genitourinary: Denies frequency, urgency, burning/pain, blood in urine, incontinence, change in urinary strength. Musculoskeletal: Denies muscle/joint pain, stiffness, back pain, redness or swelling of joints, trauma Skin: Denies rashes, lumps, itching, dryness, color changes, or hair/nail changes Neurological: Denies dizziness, fainting, seizures, weakness, numbness, tingling,  tremor Psychiatric - Denies nervousness, stress, depression or memory loss Endocrine: Denies heat or cold intolerance, sweating, frequent urination, excessive thirst, changes in appetite Hematologic: Denies ease of bruising or bleeding     Objective:    BP 118/90 mmHg  Pulse 81  Temp(Src) 98.6 F (37 C) (Oral)  Resp 18  Ht  (1.626 m)  Wt 271 lb 1.9 oz (122.979 kg)  BMI 46.51 kg/m2  SpO2 99% Nursing note and vital signs reviewed.  Physical Exam  Constitutional: She is oriented to person, place, and time. She appears well-developed and well-nourished.  HENT:  Head: Normocephalic.  Right Ear: Hearing, tympanic membrane, external ear and ear canal normal.  Left Ear: Hearing, tympanic membrane, external ear and ear canal normal.  Nose: Nose normal.  Mouth/Throat: Uvula is midline, oropharynx is clear and moist and mucous membranes are normal.  Eyes: Conjunctivae and EOM are normal. Pupils are equal, round, and reactive to light.  Neck: Neck supple. No JVD present. No tracheal deviation present. No thyromegaly present.  Cardiovascular: Normal rate, regular rhythm, normal heart sounds and intact distal pulses.   Pulmonary/Chest: Effort normal and breath sounds normal.  Abdominal: Soft. Bowel sounds are normal. She exhibits no distension and no mass. There is no tenderness. There is no rebound and no guarding.  Musculoskeletal: Normal range of motion. She exhibits no edema or tenderness.  Lymphadenopathy:    She has no cervical adenopathy.  Neurological: She is alert and oriented to person, place, and time. She has normal reflexes. No cranial nerve deficit. She exhibits normal muscle tone. Coordination normal.  Skin: Skin is warm and dry.  Psychiatric: She has a normal mood and affect. Her behavior is normal. Judgment and thought content normal.       Assessment & Plan:

## 2015-02-15 NOTE — Progress Notes (Signed)
Pre visit review using our clinic review tool, if applicable. No additional management support is needed unless otherwise documented below in the visit note. 

## 2015-02-15 NOTE — Assessment & Plan Note (Signed)
1) Anticipatory Guidance: Discussed importance of wearing a seatbelt while driving and not texting while driving; changing batteries in smoke detector at least once annually; wearing suntan lotion when outside; eating a balanced and moderate diet; getting physical activity at least 30 minutes per day.  2) Immunizations / Screenings / Labs:  All immunizations are up-to-date per recommendations. All screenings are up-to-date per recommendations. Obtain CBC, BMET, Lipid profile and TSH.  Overall well physical exam. Patient's major cardiovascular risk factor at present is severe obesity. Discussed  Importance of increasing nutrient density of her diet and also increasing physical activity to 30 minutes most days of the week of moderate level intensity. He lose approximately 5-10% of her body weight. Patient indicates motivation to do so. Follow-up prevention exam in one year. Follow-up office visit pending lab work if needed.

## 2015-02-15 NOTE — Patient Instructions (Signed)
Thank you for choosing [facility].  Summary/Instructions:  Please stop by the lab on the basement level of the building for your blood work. Your results will be released to [facility] (or called to you) after review, usually within 72 hours after test completion. If any changes need to be made, you will be notified at that same time.  If your symptoms worsen or fail to improve, please contact our office for further instruction, or in case of emergency go directly to the emergency room at the closest medical facility.    Health Maintenance Adopting a healthy lifestyle and getting preventive care can go a long way to promote health and wellness. Talk with your health care provider about what schedule of regular examinations is right for you. This is a good chance for you to check in with your provider about disease prevention and staying healthy. In between checkups, there are plenty of things you can do on your own. Experts have done a lot of research about which lifestyle changes and preventive measures are most likely to keep you healthy. Ask your health care provider for more information. WEIGHT AND DIET  Eat a healthy diet  Be sure to include plenty of vegetables, fruits, low-fat dairy products, and lean protein.  Do not eat a lot of foods high in solid fats, added sugars, or salt.  Get regular exercise. This is one of the most important things you can do for your health.  Most adults should exercise for at least 150 minutes each week. The exercise should increase your heart rate and make you sweat (moderate-intensity exercise).  Most adults should also do strengthening exercises at least twice a week. This is in addition to the moderate-intensity exercise.  Maintain a healthy weight  Body mass index (BMI) is a measurement that can be used to identify possible weight problems. It estimates body fat based on height and weight. Your health care provider can help determine your BMI  and help you achieve or maintain a healthy weight.  For females 27 years of age and older:   A BMI below 18.5 is considered underweight.  A BMI of 18.5 to 24.9 is normal.  A BMI of 25 to 29.9 is considered overweight.  A BMI of 30 and above is considered obese.  Watch levels of cholesterol and blood lipids  You should start having your blood tested for lipids and cholesterol at 34 years of age, then have this test every 5 years.  You may need to have your cholesterol levels checked more often if:  Your lipid or cholesterol levels are high.  You are older than 34 years of age.  You are at high risk for heart disease.  CANCER SCREENING   Lung Cancer  Lung cancer screening is recommended for adults 64-54 years old who are at high risk for lung cancer because of a history of smoking.  A yearly low-dose CT scan of the lungs is recommended for people who:  Currently smoke.  Have quit within the past 15 years.  Have at least a 30-pack-year history of smoking. A pack year is smoking an average of one pack of cigarettes a day for 1 year.  Yearly screening should continue until it has been 15 years since you quit.  Yearly screening should stop if you develop a health problem that would prevent you from having lung cancer treatment.  Breast Cancer  Practice breast self-awareness. This means understanding how your breasts normally appear and feel.  It  also means doing regular breast self-exams. Let your health care provider know about any changes, no matter how small.  If you are in your 20s or 30s, you should have a clinical breast exam (CBE) by a health care provider every 1-3 years as part of a regular health exam.  If you are 40 or older, have a CBE every year. Also consider having a breast X-ray (mammogram) every year.  If you have a family history of breast cancer, talk to your health care provider about genetic screening.  If you are at high risk for breast cancer,  talk to your health care provider about having an MRI and a mammogram every year.  Breast cancer gene (BRCA) assessment is recommended for women who have family members with BRCA-related cancers. BRCA-related cancers include:  Breast.  Ovarian.  Tubal.  Peritoneal cancers.  Results of the assessment will determine the need for genetic counseling and BRCA1 and BRCA2 testing. Cervical Cancer Routine pelvic examinations to screen for cervical cancer are no longer recommended for nonpregnant women who are considered low risk for cancer of the pelvic organs (ovaries, uterus, and vagina) and who do not have symptoms. A pelvic examination may be necessary if you have symptoms including those associated with pelvic infections. Ask your health care provider if a screening pelvic exam is right for you.   The Pap test is the screening test for cervical cancer for women who are considered at risk.  If you had a hysterectomy for a problem that was not cancer or a condition that could lead to cancer, then you no longer need Pap tests.  If you are older than 65 years, and you have had normal Pap tests for the past 10 years, you no longer need to have Pap tests.  If you have had past treatment for cervical cancer or a condition that could lead to cancer, you need Pap tests and screening for cancer for at least 20 years after your treatment.  If you no longer get a Pap test, assess your risk factors if they change (such as having a new sexual partner). This can affect whether you should start being screened again.  Some women have medical problems that increase their chance of getting cervical cancer. If this is the case for you, your health care provider may recommend more frequent screening and Pap tests.  The human papillomavirus (HPV) test is another test that may be used for cervical cancer screening. The HPV test looks for the virus that can cause cell changes in the cervix. The cells collected  during the Pap test can be tested for HPV.  The HPV test can be used to screen women 30 years of age and older. Getting tested for HPV can extend the interval between normal Pap tests from three to five years.  An HPV test also should be used to screen women of any age who have unclear Pap test results.  After 34 years of age, women should have HPV testing as often as Pap tests.  Colorectal Cancer  This type of cancer can be detected and often prevented.  Routine colorectal cancer screening usually begins at 34 years of age and continues through 34 years of age.  Your health care provider may recommend screening at an earlier age if you have risk factors for colon cancer.  Your health care provider may also recommend using home test kits to check for hidden blood in the stool.  A small camera at the end   of a tube can be used to examine your colon directly (sigmoidoscopy or colonoscopy). This is done to check for the earliest forms of colorectal cancer.  Routine screening usually begins at age 50.  Direct examination of the colon should be repeated every 5-10 years through 34 years of age. However, you may need to be screened more often if early forms of precancerous polyps or small growths are found. Skin Cancer  Check your skin from head to toe regularly.  Tell your health care provider about any new moles or changes in moles, especially if there is a change in a mole's shape or color.  Also tell your health care provider if you have a mole that is larger than the size of a pencil eraser.  Always use sunscreen. Apply sunscreen liberally and repeatedly throughout the day.  Protect yourself by wearing long sleeves, pants, a wide-brimmed hat, and sunglasses whenever you are outside. HEART DISEASE, DIABETES, AND HIGH BLOOD PRESSURE   Have your blood pressure checked at least every 1-2 years. High blood pressure causes heart disease and increases the risk of stroke.  If you are  between 55 years and 79 years old, ask your health care provider if you should take aspirin to prevent strokes.  Have regular diabetes screenings. This involves taking a blood sample to check your fasting blood sugar level.  If you are at a normal weight and have a low risk for diabetes, have this test once every three years after 34 years of age.  If you are overweight and have a high risk for diabetes, consider being tested at a younger age or more often. PREVENTING INFECTION  Hepatitis B  If you have a higher risk for hepatitis B, you should be screened for this virus. You are considered at high risk for hepatitis B if:  You were born in a country where hepatitis B is common. Ask your health care provider which countries are considered high risk.  Your parents were born in a high-risk country, and you have not been immunized against hepatitis B (hepatitis B vaccine).  You have HIV or AIDS.  You use needles to inject street drugs.  You live with someone who has hepatitis B.  You have had sex with someone who has hepatitis B.  You get hemodialysis treatment.  You take certain medicines for conditions, including cancer, organ transplantation, and autoimmune conditions. Hepatitis C  Blood testing is recommended for:  Everyone born from 1945 through 1965.  Anyone with known risk factors for hepatitis C. Sexually transmitted infections (STIs)  You should be screened for sexually transmitted infections (STIs) including gonorrhea and chlamydia if:  You are sexually active and are younger than 34 years of age.  You are older than 34 years of age and your health care provider tells you that you are at risk for this type of infection.  Your sexual activity has changed since you were last screened and you are at an increased risk for chlamydia or gonorrhea. Ask your health care provider if you are at risk.  If you do not have HIV, but are at risk, it may be recommended that you  take a prescription medicine daily to prevent HIV infection. This is called pre-exposure prophylaxis (PrEP). You are considered at risk if:  You are sexually active and do not regularly use condoms or know the HIV status of your partner(s).  You take drugs by injection.  You are sexually active with a partner who has HIV.   Talk with your health care provider about whether you are at high risk of being infected with HIV. If you choose to begin PrEP, you should first be tested for HIV. You should then be tested every 3 months for as long as you are taking PrEP.  PREGNANCY   If you are premenopausal and you may become pregnant, ask your health care provider about preconception counseling.  If you may become pregnant, take 400 to 800 micrograms (mcg) of folic acid every day.  If you want to prevent pregnancy, talk to your health care provider about birth control (contraception). OSTEOPOROSIS AND MENOPAUSE   Osteoporosis is a disease in which the bones lose minerals and strength with aging. This can result in serious bone fractures. Your risk for osteoporosis can be identified using a bone density scan.  If you are 79 years of age or older, or if you are at risk for osteoporosis and fractures, ask your health care provider if you should be screened.  Ask your health care provider whether you should take a calcium or vitamin D supplement to lower your risk for osteoporosis.  Menopause may have certain physical symptoms and risks.  Hormone replacement therapy may reduce some of these symptoms and risks. Talk to your health care provider about whether hormone replacement therapy is right for you.  HOME CARE INSTRUCTIONS   Schedule regular health, dental, and eye exams.  Stay current with your immunizations.   Do not use any tobacco products including cigarettes, chewing tobacco, or electronic cigarettes.  If you are pregnant, do not drink alcohol.  If you are breastfeeding, limit how  much and how often you drink alcohol.  Limit alcohol intake to no more than 1 drink per day for nonpregnant women. One drink equals 12 ounces of beer, 5 ounces of wine, or 1 ounces of hard liquor.  Do not use street drugs.  Do not share needles.  Ask your health care provider for help if you need support or information about quitting drugs.  Tell your health care provider if you often feel depressed.  Tell your health care provider if you have ever been abused or do not feel safe at home. Document Released: 05/08/2011 Document Revised: 03/09/2014 Document Reviewed: 09/24/2013 [facility] Patient Information 2015 [place], [place]. This information is not intended to replace advice given to you by your health care provider. Make sure you discuss any questions you have with your health care provider.

## 2015-03-15 ENCOUNTER — Other Ambulatory Visit (HOSPITAL_COMMUNITY): Payer: Self-pay | Admitting: Obstetrics and Gynecology

## 2015-03-17 LAB — CYTOLOGY - PAP

## 2015-05-08 ENCOUNTER — Ambulatory Visit (INDEPENDENT_AMBULATORY_CARE_PROVIDER_SITE_OTHER): Payer: 59 | Admitting: Family Medicine

## 2015-05-08 ENCOUNTER — Encounter: Payer: Self-pay | Admitting: Family Medicine

## 2015-05-08 VITALS — BP 112/80 | HR 77 | Temp 98.1°F | Ht 64.0 in | Wt 275.2 lb

## 2015-05-08 DIAGNOSIS — R110 Nausea: Secondary | ICD-10-CM | POA: Diagnosis not present

## 2015-05-08 DIAGNOSIS — G44209 Tension-type headache, unspecified, not intractable: Secondary | ICD-10-CM | POA: Diagnosis not present

## 2015-05-08 MED ORDER — ONDANSETRON 4 MG PO TBDP: 4.0000 mg | ORAL_TABLET | Freq: Three times a day (TID) | ORAL | Status: DC | PRN

## 2015-05-08 NOTE — Patient Instructions (Addendum)
Headache  Some features of tension/some of migraine  I think toradol and antinausea shot with resting up would help you get over this sooner.   You opted to continue ibuprofen for the headache and take antinausea medicine as needed.   Zofran as needed for nausea [facility] [place]    I really think rest is what you need and think stressors are contributing to both headaches and abdominal issues.    I suspect the abdominal issues potentially could be IBS. I would consider talking to Dr. Felicity CoyerLeschber more about this. You can continue as needed imodium for now

## 2015-05-08 NOTE — Progress Notes (Signed)
PCP: Rene PaciValerie Leschber, MD  Subjective:  Julie Mcneil is a 34 y.o. year old very pleasant female patient who presents with headache and GI symptoms -complains of headache with nausea on and off since tuesday. Mild dizziness with this. Pain progresses throughout the day. Ibuprofen 600mg  takes edge off. No vomiting. Does feel some itching/anxious feeling in her skin. Pain from frontal to occiptal area and into neck. Throbbing sensation. Feels bloated. alternating constipation and diarrhea for 1 week. Takes immodium and helps somewhat. This is typical for her headache pattern. Admits a lot of her plate this weekend and times of stress seem to be worse for chronic issues with GI and headaches.   BTL. LMP June 22nd then ended beginning of this week (usually 10 days) also on birth control to regulate periods.   ROS- no blurred vision, fever, stiff neck, extremity weakness.   Pertinent Past Medical History- tension headaches diagnosed 2014, worsening/changing headache pattern 2015 leading to CT head 04/03/14 which was normal.   Medications- reviewed  Current Outpatient Prescriptions  Medication Sig Dispense Refill  . ibuprofen (ADVIL,MOTRIN) 200 MG tablet Take 600 mg by mouth every 6 (six) hours as needed for fever or mild pain.     Objective: BP 112/80 mmHg  Pulse 77  Temp(Src) 98.1 F (36.7 C) (Oral)  Ht 5\' 4"  (1.626 m)  Wt 275 lb 4 oz (124.853 kg)  BMI 47.22 kg/m2  SpO2 97%  LMP 04/29/2015 Gen: NAD, resting comfortably in bed HEENT: nares normal, oropharynx normal without pharyngeal exudate, TM normal bilaterally, Mucous membranes are moist. NCAT. PERRLA.  CV: RRR no mrg  Lungs: CTAB  Abd: soft/nontender/nondistended/normal bowel sounds. Morbidly obese.  MSK: moves all extremities, no edema  Skin: warm and dry, no rash  Neuro: CN II-XII intact, sensation and reflexes normal throughout, 5/5 muscle strength in bilateral upper and lower extremities. Normal finger to nose. Normal rapid  alternating movements.   Assessment/Plan:  Tension headache Flare this week. Mixed features of tension (diffuse including neck) and migraine (photophobia, throbbing, nausea) though the nausea may be caused with recurrent GI symptoms which may be IBS. Patient with a lot of stress this week. Advised she needed rest and I advised toradol/phenergan shot and go home and sleep. She would prefer oral option for nausea and continue ibuprofen and follow up in clinic next week if symptoms do not resolve (usually she has spells of both GI and headache symptoms that last about a week). Reassuring normal neuro exam and patient has had head imaging almost within a yea. No red flags. Advised PCP follow up to discuss GI portion of symptoms   Return precautions advised.   Meds ordered this encounter  Medications  . ondansetron (ZOFRAN-ODT) 4 MG disintegrating tablet    Sig: Take 1 tablet (4 mg total) by mouth every 8 (eight) hours as needed for nausea or vomiting.    Dispense:  20 tablet    Refill:  0

## 2015-05-08 NOTE — Progress Notes (Signed)
Pre visit review using our clinic review tool, if applicable. No additional management support is needed unless otherwise documented below in the visit note. 

## 2015-05-08 NOTE — Assessment & Plan Note (Addendum)
Flare this week. Mixed features of tension (diffuse including neck) and migraine (photophobia, throbbing, nausea) though the nausea may be caused with recurrent GI symptoms which may be IBS. Patient with a lot of stress this week. Advised Julie Mcneil needed rest and I advised toradol/phenergan shot and go home and sleep. Julie Mcneil would prefer oral option for nausea and continue ibuprofen and follow up in clinic next week if symptoms do not resolve (usually Julie Mcneil has spells of both GI and headache symptoms that last about a week). Reassuring normal neuro exam and patient has had head imaging almost within a yea. No red flags. Advised PCP follow up to discuss GI portion of symptoms

## 2015-05-14 ENCOUNTER — Ambulatory Visit (INDEPENDENT_AMBULATORY_CARE_PROVIDER_SITE_OTHER): Payer: 59 | Admitting: Women's Health

## 2015-05-14 ENCOUNTER — Encounter: Payer: Self-pay | Admitting: Women's Health

## 2015-05-14 VITALS — BP 132/86

## 2015-05-14 DIAGNOSIS — N926 Irregular menstruation, unspecified: Secondary | ICD-10-CM

## 2015-05-14 DIAGNOSIS — G44019 Episodic cluster headache, not intractable: Secondary | ICD-10-CM

## 2015-05-14 MED ORDER — IBUPROFEN 200 MG PO TABS: 600.0000 mg | ORAL_TABLET | Freq: Four times a day (QID) | ORAL | Status: DC | PRN

## 2015-05-14 NOTE — Progress Notes (Signed)
Patient ID: Max SaneElizabeth A Mcneil, female   DOB: 03/28/1981, 34 y.o.   MRN: [id number] Presents with complaint of irregular periods,  history of amenorrhea, irregular cycles when younger and became regular monthly cycles after birth of first child 10 years ago and have remained regular monthly cycles, youngest child is 3-1/2. Last 6 months cycles have become more irregular, cycles lasting 5-10 days, stopping for a week then spotting, spotting throughout the month for the past 3 months. Denies vaginal discharge, urinary symptoms, abdominal pain. Has had increased headaches in the past month, worse yesterday than today, has seen primary care concerning headaches. 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 Normal TSH 2 months ago. BTL.  Exam: Appears well, abdomen obese, external genitalia within normal limits, speculum exam no visible blood in vault, no discharge, cervix without visible lesion or polyp. Bimanual no CMT limited exam due to girth.   DUB/anovulatory bleeding Morbid obesity  Plan: Sonohysterogram with Dr. Lily PeerFernandez, schedule after next cycle,  Continue keeping menstrual calendar. Motrin 600 when necessary for headaches and follow-up with primary care if headaches persist.

## 2015-05-14 NOTE — Patient Instructions (Signed)
Sonohysterogram A sonohysterogram is a procedure to examine the inside of your uterus. This exam uses sound waves sent to a computer to make real-time pictures of the inside of your uterus. To get the best images, a germ-free, saltwater solution (sterile saline) is injected into your uterus through your vagina. A sonohysterogram can show whether there is scarring or abnormal growths inside your uterus. It can also show whether your uterus is an abnormal shape or whether the lining is too thin.  LET [facility] PROVIDER KNOW ABOUT:  Any allergies you have.  All medicines you are taking, including vitamins, herbs, eyedrops, creams, and over-the-counter medicines.  Previous problems you or members of your family have had with the use of anesthetics.  Any blood disorders you have.  Previous surgeries you have had.  Medical conditions you have.  The dates of your last period.  Possibility of pregnancy. RISKS AND COMPLICATIONS Generally, a sonohysterogram is a safe procedure. However, as with any procedure, problems can occur. Possible problems include:  Bleeding.  Infection. BEFORE THE PROCEDURE  Your health care provider may have you take an over-the-counter pain medicine.  You may get a prescription for antibiotic medicine.  Your health care provider may give you a pregnancy test before the procedure.  You will empty your bladder. PROCEDURE   You will lie down on the examining table with your knees raised or your feet in stirrups.  Your health care provider may do a pelvic exam before starting the procedure.  A slender, handheld device (transducer) will be lubricated and placed into your vagina.  The transducer will be positioned to send sound waves to your uterus.  The sound waves will bounce back to the transducer. They will be sent to a computer.  The computer will turn the sound waves into live images.  Your health care provider will view the images on a screen  during the procedure.  Your health care provider will remove the transducer from your vagina and use an instrument to widen the opening (speculum).  A swab will be used to clean the opening to your uterus (cervix).  A long, thin tube (catheter) will then be placed through your cervix into your uterus.  Your health care provider will fill your uterus with sterile saline through the catheter. You may feel some cramping.  The speculum will be removed.  The transducer will be placed back in your vagina to take more images. AFTER THE PROCEDURE After the procedure, it is typical to have light bleeding from your vagina, cramping, and watery vaginal discharge.  Document Released: 03/09/2014 Document Reviewed: 10/06/2013 [facility] Patient Information 2015 [place]. This information is not intended to replace advice given to you by your health care provider. Make sure you discuss any questions you have with your health care provider. Dysfunctional Uterine Bleeding Normally, menstrual periods begin between ages 6111 to 6917 in [place] women. A normal menstrual cycle/period may begin every 23 days up to 35 days and lasts from 1 to 7 days. Around 12 to 14 days before your menstrual period starts, ovulation (ovary produces an egg) occurs. When counting the time between menstrual periods, count from the first day of bleeding of the previous period to the first day of bleeding of the next period. Dysfunctional (abnormal) uterine bleeding is bleeding that is different from a normal menstrual period. Your periods may come earlier or later than usual. They may be lighter, have blood clots or be heavier. You may have bleeding between  periods, or you may skip one period or more. You may have bleeding after sexual intercourse, bleeding after menopause, or no menstrual period. CAUSES   Pregnancy (normal, miscarriage, tubal).  IUDs (intrauterine device, birth control).  Birth control pills.  Hormone  treatment.  Menopause.  Infection of the cervix.  Blood clotting problems.  Infection of the inside lining of the uterus.  Endometriosis, inside lining of the uterus growing in the pelvis and other female organs.  Adhesions (scar tissue) inside the uterus.  Obesity or severe weight loss.  Uterine polyps inside the uterus.  Cancer of the vagina, cervix, or uterus.  Ovarian cysts or polycystic ovary syndrome.  Medical problems (diabetes, thyroid disease).  Uterine fibroids (noncancerous tumor).  Problems with your female hormones.  Endometrial hyperplasia, very thick lining and enlarged cells inside of the uterus.  Medicines that interfere with ovulation.  Radiation to the pelvis or abdomen.  Chemotherapy. DIAGNOSIS   Your doctor will discuss the history of your menstrual periods, medicines you are taking, changes in your weight, stress in your life, and any medical problems you may have.  Your doctor will do a physical and pelvic examination.  Your doctor may want to perform certain tests to make a diagnosis, such as:  Pap test.  Blood tests.  Cultures for infection.  CT scan.  Ultrasound.  Hysteroscopy.  Laparoscopy.  MRI.  Hysterosalpingography.  D and C.  Endometrial biopsy. TREATMENT  Treatment will depend on the cause of the dysfunctional uterine bleeding (DUB). Treatment may include:  Observing your menstrual periods for a couple of months.  Prescribing medicines for medical problems, including:  Antibiotics.  Hormones.  Birth control pills.  Removing an IUD (intrauterine device, birth control).  Surgery:  D and C (scrape and remove tissue from inside the uterus).  Laparoscopy (examine inside the abdomen with a lighted tube).  Uterine ablation (destroy lining of the uterus with electrical current, laser, heat, or freezing).  Hysteroscopy (examine cervix and uterus with a lighted tube).  Hysterectomy (remove the  uterus). HOME CARE INSTRUCTIONS   If medicines were prescribed, take exactly as directed. Do not change or switch medicines without consulting your caregiver.  Long term heavy bleeding may result in iron deficiency. Your caregiver may have prescribed iron pills. They help replace the iron that your body lost from heavy bleeding. Take exactly as directed.  Do not take aspirin or medicines that contain aspirin one week before or during your menstrual period. Aspirin may make the bleeding worse.  If you need to change your sanitary pad or tampon more than once every 2 hours, stay in bed with your feet elevated and a cold pack on your lower abdomen. Rest as much as possible, until the bleeding stops or slows down.  Eat well-balanced meals. Eat foods high in iron. Examples are:  Leafy green vegetables.  Whole-grain breads and cereals.  Eggs.  Meat.  Liver.  Do not try to lose weight until the abnormal bleeding has stopped and your blood iron level is back to normal. Do not lift more than ten pounds or do strenuous activities when you are bleeding.  For a couple of months, make note on your calendar, marking the start and ending of your period, and the type of bleeding (light, medium, heavy, spotting, clots or missed periods). This is for your caregiver to better evaluate your problem. SEEK MEDICAL CARE IF:   You develop nausea (feeling sick to your stomach) and vomiting, dizziness, or diarrhea while you  are taking your medicine.  You are getting lightheaded or weak.  You have any problems that may be related to the medicine you are taking.  You develop pain with your DUB.  You want to remove your IUD.  You want to stop or change your birth control pills or hormones.  You have any type of abnormal bleeding mentioned above.  You are over 53 years old and have not had a menstrual period yet.  You are 34 years old and you are still having menstrual periods.  You have any of the  symptoms mentioned above.  You develop a rash. SEEK IMMEDIATE MEDICAL CARE IF:   An oral temperature above 102 F (38.9 C) develops.  You develop chills.  You are changing your sanitary pad or tampon more than once an hour.  You develop abdominal pain.  You pass out or faint. Document Released: 10/20/2000 Document Revised: 01/15/2012 Document Reviewed: 09/21/2009 [facility] Patient Information 2015 [place], [place]. This information is not intended to replace advice given to you by your health care provider. Make sure you discuss any questions you have with your health care provider.

## 2015-05-17 ENCOUNTER — Ambulatory Visit (INDEPENDENT_AMBULATORY_CARE_PROVIDER_SITE_OTHER): Payer: 59 | Admitting: Adult Health

## 2015-05-17 ENCOUNTER — Telehealth: Payer: Self-pay | Admitting: Internal Medicine

## 2015-05-17 ENCOUNTER — Encounter: Payer: Self-pay | Admitting: Adult Health

## 2015-05-17 VITALS — BP 148/100 | HR 85 | Temp 98.8°F | Resp 19 | Ht 64.0 in | Wt 274.9 lb

## 2015-05-17 DIAGNOSIS — G43011 Migraine without aura, intractable, with status migrainosus: Secondary | ICD-10-CM

## 2015-05-17 MED ORDER — SUMATRIPTAN SUCCINATE 50 MG PO TABS: 50.0000 mg | ORAL_TABLET | ORAL | Status: DC | PRN

## 2015-05-17 NOTE — Telephone Encounter (Signed)
Patient Name: Julie Mcneil DOB: 05/18/1981 Initial Comment Caller states she has had a headache for a few days. Also feeling nauseous and fatigued. Pain from neck to head. Nurse Assessment Nurse: Elijah Birkaldwell, RN, Lynda Date/Time (Eastern Time): 05/17/2015 11:07:04 AM Confirm and document reason for call. If symptomatic, describe symptoms. ---Caller states she has had a headache for a couple weeks, mostly on right side on eyeball & ear. Also feeling nauseous and fatigued. Pain from neck to head. Seen by Dr., they thought it might be stress. It seems to be worse especially after sexual activity. Feels dizzy at times. Taking Ibuprofen which helps some. Has the patient traveled out of the country within the last 30 days? ---Not Applicable Does the patient require triage? ---Yes Related visit to physician within the last 2 weeks? ---Yes Does the PT have any chronic conditions? (i.e. diabetes, asthma, etc.) ---No Did the patient indicate they were pregnant? ---No Guidelines Guideline Title Affirmed Question Affirmed Notes Headache [1] MODERATE headache (e.g., interferes with normal activities) AND [2] present > 24 hours AND [3] unexplained (Exceptions: analgesics not tried, typical migraine, or headache part of viral illness) Final Disposition User See Physician within 24 Hours Andersonaldwell, RN, Stark BrayLynda Comments Caller states she mentioned the headaches to her OB/GYN Dr. who laughed it off as stress. Caller states she has been dealing with this for a couple weeks now, no relief. Taking Ibuprofen twice a day.

## 2015-05-17 NOTE — Patient Instructions (Addendum)
It was great meeting you today!  I am sorry you are feeling so bad. I have sent a prescription for imitrex to your pharmacy. Take one as soon as you get it, if you do not notice any change in two hours, you can take another. Do not take more than two in 24 hours. I would also add 600mg  of Ibuprofen every 6 hours for the next two days.   Migraine Headache A migraine headache is an intense, throbbing pain on one or both sides of your head. A migraine can last for 30 minutes to several hours. CAUSES  The exact cause of a migraine headache is not always known. However, a migraine may be caused when nerves in the brain become irritated and release chemicals that cause inflammation. This causes pain. Certain things may also trigger migraines, such as:  Alcohol.  Smoking.  Stress.  Menstruation.  Aged cheeses.  Foods or drinks that contain nitrates, glutamate, aspartame, or tyramine.  Lack of sleep.  Chocolate.  Caffeine.  Hunger.  Physical exertion.  Fatigue.  Medicines used to treat chest pain (nitroglycerine), birth control pills, estrogen, and some blood pressure medicines. SIGNS AND SYMPTOMS  Pain on one or both sides of your head.  Pulsating or throbbing pain.  Severe pain that prevents daily activities.  Pain that is aggravated by any physical activity.  Nausea, vomiting, or both.  Dizziness.  Pain with exposure to bright lights, loud noises, or activity.  General sensitivity to bright lights, loud noises, or smells. Before you get a migraine, you may get warning signs that a migraine is coming (aura). An aura may include:  Seeing flashing lights.  Seeing bright spots, halos, or zigzag lines.  Having tunnel vision or blurred vision.  Having feelings of numbness or tingling.  Having trouble talking.  Having muscle weakness. DIAGNOSIS  A migraine headache is often diagnosed based on:  Symptoms.  Physical exam.  A CT scan or MRI of your head. These  imaging tests cannot diagnose migraines, but they can help rule out other causes of headaches. TREATMENT Medicines may be given for pain and nausea. Medicines can also be given to help prevent recurrent migraines.  HOME CARE INSTRUCTIONS  Only take over-the-counter or prescription medicines for pain or discomfort as directed by your health care provider. The use of long-term narcotics is not recommended.  Lie down in a dark, quiet room when you have a migraine.  Keep a journal to find out what may trigger your migraine headaches. For example, write down:  What you eat and drink.  How much sleep you get.  Any change to your diet or medicines.  Limit alcohol consumption.  Quit smoking if you smoke.  Get 7-9 hours of sleep, or as recommended by your health care provider.  Limit stress.  Keep lights dim if bright lights bother you and make your migraines worse. SEEK IMMEDIATE MEDICAL CARE IF:   Your migraine becomes severe.  You have a fever.  You have a stiff neck.  You have vision loss.  You have muscular weakness or loss of muscle control.  You start losing your balance or have trouble walking.  You feel faint or pass out.  You have severe symptoms that are different from your first symptoms. MAKE SURE YOU:   Understand these instructions.  Will watch your condition.  Will get help right away if you are not doing well or get worse. Document Released: 10/23/2005 Document Revised: 03/09/2014 Document Reviewed: 06/30/2013 ExitCare Patient  Information 2015 [employer], [employer]. This information is not intended to replace advice given to you by your health care provider. Make sure you discuss any questions you have with your health care provider.  

## 2015-05-17 NOTE — Telephone Encounter (Signed)
It looks like she got an appointment over at the [facility]

## 2015-05-17 NOTE — Progress Notes (Signed)
Pre visit review using our clinic review tool, if applicable. No additional management support is needed unless otherwise documented below in the visit note. 

## 2015-05-17 NOTE — Progress Notes (Signed)
Subjective:    Patient ID: Julie Mcneil, female    DOB: 1981-07-28, 34 y.o.   MRN: [id number]  HPI   34 year old female who presents to the office for three weeks of right sided headaches that radiate from right eye to neck. She endorses that she becomes nauseated when the headaches become bad. Described as throbbing sometimes and pressure other times.   Pain right now is 3/10 with pain up to 5/10. The headache has been constant, eases up, but then the pain comes back.   Laying down helps with the pain. Tylenol and motrin "take the edge off". Denies photophobia.   Thursday night she had sex with her husband and after sex was when her head hurt the most, in the back of the head and lasted for about 15-20 minutes.   Has quit caffeine in the last 30 days     Review of Systems  Constitutional: Positive for activity change and fatigue. Negative for fever, chills, diaphoresis, appetite change and unexpected weight change.  HENT: Positive for congestion (right side nasal congestion), ear discharge and sinus pressure (right sided). Negative for facial swelling, postnasal drip and rhinorrhea.   Eyes: Negative.   Respiratory: Negative.   Cardiovascular: Negative.   Musculoskeletal: Positive for myalgias, arthralgias and neck pain. Negative for neck stiffness.  Neurological: Negative.   Psychiatric/Behavioral: Negative.   All other systems reviewed and are negative.  Past Medical History  Diagnosis Date  . Vaginal yeast infection     recurrent  . UTI (urinary tract infection)     recurrent  . GERD (gastroesophageal reflux disease)   . Obesity   . Anxiety     Panic attacks  . Cervical dysplasia     History   Social History  . Marital Status: Married    Spouse Name: N/A  . Number of Children: 3  . Years of Education: 15   Occupational History  . 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 Smoking status: Never Smoker   . Smokeless  tobacco: Never Used  . Alcohol Use: No  . Drug Use: No  . Sexual Activity: Yes    Birth Control/ Protection: Surgical     Comment: TUBAL LIGATION   Other Topics Concern  . Not on file   Social History Narrative   Fun: Going to [employer]    Past Surgical History  Procedure Laterality Date  . Cesarean section  4098,1191    11-28-11  . Tubal ligation      2013 C-SECTION  . Colposcopy      Family History  Problem Relation Age of Onset  . Anesthesia problems Neg Hx   . Hypotension Neg Hx   . Malignant hyperthermia Neg Hx   . Pseudochol deficiency Neg Hx   . Early death Mother   . Heart disease Maternal Grandmother   . Diabetes Maternal Grandmother   . Cancer Maternal Grandmother     Lymphoma  . Hypertension Paternal Grandmother   . Hypertension Paternal Grandfather   . Stroke Paternal Grandmother     Allergies  Allergen Reactions  . Nitrofurantoin Monohyd Macro Hives and Nausea And Vomiting    Current Outpatient Prescriptions on File Prior to Visit  Medication Sig Dispense Refill  . ibuprofen (ADVIL,MOTRIN) 200 MG tablet Take 3 tablets (600 mg total) by mouth every 6 (six) hours as needed for fever or mild pain. 30 tablet 1  . ondansetron (  ZOFRAN-ODT) 4 MG disintegrating tablet Take 1 tablet (4 mg total) by mouth every 8 (eight) hours as needed for nausea or vomiting. (Patient not taking: Reported on 05/14/2015) 20 tablet 0   No current facility-administered medications on file prior to visit.    BP 148/100 mmHg  Pulse 85  Temp(Src) 98.8 F (37.1 C) (Oral)  Resp 19  Ht 5\' 4"  (1.626 m)  Wt 274 lb 14.4 oz (124.694 kg)  BMI 47.16 kg/m2  SpO2 98%  LMP 04/29/2015       Objective:   Physical Exam  Constitutional: She is oriented to person, place, and time. She appears well-developed and well-nourished. No distress.  HENT:  Head: Normocephalic and atraumatic.  Right Ear: External ear normal.  Left Ear: External ear normal.  Nose: Nose normal.  Mouth/Throat:  Oropharynx is clear and moist.  TM visualized in bilateral ears. No effusion or signs of infections  Eyes: Conjunctivae and EOM are normal. Pupils are equal, round, and reactive to light.  Neck: Normal range of motion. Neck supple.  Cardiovascular: Normal rate, regular rhythm, normal heart sounds and intact distal pulses.  Exam reveals no gallop and no friction rub.   No murmur heard. Pulmonary/Chest: Effort normal and breath sounds normal. No respiratory distress. She has no wheezes. She has no rales. She exhibits no tenderness.  Musculoskeletal: Normal range of motion. She exhibits no edema or tenderness.  No nuchal rigidity  Negative Kernig's sign  Neurological: She is alert and oriented to person, place, and time. She has normal reflexes.  Skin: Skin is warm and dry. No rash noted. She is not diaphoretic. No erythema. No pallor.  Psychiatric: She has a normal mood and affect. Her behavior is normal. Judgment and thought content normal.  Nursing note and vitals reviewed.      Assessment & Plan:  1. Intractable migraine without aura and with status migrainosus - Refused IM toradol - SUMAtriptan (IMITREX) 50 MG tablet; Take 1 tablet (50 mg total) by mouth every 2 (two) hours as needed for migraine. May repeat in 2 hours if headache persists or recurs.  Dispense: 10 tablet; Refill: 0 - CT Head Wo Contrast; Future - Follow up if no improvement in 2-3 days.  - Go to the ER with increased pain or vision changes.

## 2015-05-17 NOTE — Telephone Encounter (Signed)
OV with any provider.

## 2015-05-19 ENCOUNTER — Ambulatory Visit (INDEPENDENT_AMBULATORY_CARE_PROVIDER_SITE_OTHER)
Admission: RE | Admit: 2015-05-19 | Discharge: 2015-05-19 | Disposition: A | Discharge status: Home / Self Care | Payer: 59 | Source: Ambulatory Visit | Attending: Adult Health | Admitting: Adult Health

## 2015-05-19 DIAGNOSIS — G43011 Migraine without aura, intractable, with status migrainosus: Secondary | ICD-10-CM

## 2015-05-20 ENCOUNTER — Telehealth: Payer: Self-pay | Admitting: Internal Medicine

## 2015-05-20 ENCOUNTER — Telehealth: Payer: Self-pay

## 2015-05-20 NOTE — Telephone Encounter (Signed)
Spoke to pt and she did inform that [facility] contacted regarding the results.  Pt did mention that the sumitriptan is causing tightness in her chest when she takes it. She does state that it does work but the side effect is a bit much to handle.

## 2015-05-20 NOTE — Telephone Encounter (Signed)
[facility]. Called and advised pt of results and pt verbalized understanding.

## 2015-05-20 NOTE — Telephone Encounter (Signed)
Pt saw cory yesterday Pt would like results of CT scan done yeserday Please call back.

## 2015-05-20 NOTE — Telephone Encounter (Signed)
Pt called and wanted to know the results of the CT scan.  Instructed pt to call the [facility] office since they have the results (proper protocol).    I am happy to give results if you can/will release to me.

## 2015-05-20 NOTE — Telephone Encounter (Signed)
Stefannie,   I have released the results. Thank you for calling her.   Kandee Keen- Dorman Calderwood

## 2015-05-21 MED ORDER — BUTALBITAL-ASPIRIN-CAFFEINE 50-325-40 MG PO CAPS: 1.0000 | ORAL_CAPSULE | Freq: Two times a day (BID) | ORAL | Status: DC | PRN

## 2015-05-21 NOTE — Telephone Encounter (Signed)
Have printed and signed fiorinal that can be faxed to her pharmacy to try for the headaches. Can take twice a day as needed for headaches.

## 2015-05-21 NOTE — Telephone Encounter (Signed)
She can stop the Imitrex and follow up with PCP.   Thanks,   [facility]

## 2015-05-21 NOTE — Telephone Encounter (Signed)
Can I have some advisement for temporary relief. Will call and have her come in for a visit sooner.

## 2015-05-24 ENCOUNTER — Ambulatory Visit (INDEPENDENT_AMBULATORY_CARE_PROVIDER_SITE_OTHER): Payer: 59 | Admitting: Internal Medicine

## 2015-05-24 ENCOUNTER — Encounter: Payer: Self-pay | Admitting: Internal Medicine

## 2015-05-24 ENCOUNTER — Other Ambulatory Visit (INDEPENDENT_AMBULATORY_CARE_PROVIDER_SITE_OTHER): Payer: 59

## 2015-05-24 ENCOUNTER — Ambulatory Visit (INDEPENDENT_AMBULATORY_CARE_PROVIDER_SITE_OTHER)
Admission: RE | Admit: 2015-05-24 | Discharge: 2015-05-24 | Disposition: A | Discharge status: Home / Self Care | Payer: 59 | Source: Ambulatory Visit | Attending: Internal Medicine | Admitting: Internal Medicine

## 2015-05-24 VITALS — BP 126/72 | HR 84 | Temp 98.7°F | Resp 16 | Ht 64.0 in | Wt 277.0 lb

## 2015-05-24 DIAGNOSIS — R519 Headache, unspecified: Secondary | ICD-10-CM

## 2015-05-24 DIAGNOSIS — R51 Headache: Secondary | ICD-10-CM | POA: Diagnosis not present

## 2015-05-24 DIAGNOSIS — M542 Cervicalgia: Secondary | ICD-10-CM | POA: Insufficient documentation

## 2015-05-24 LAB — COMPREHENSIVE METABOLIC PANEL
ALT: 15 U/L (ref 0–35)
AST: 14 U/L (ref 0–37)
Albumin: 4.2 g/dL (ref 3.5–5.2)
Alkaline Phosphatase: 94 U/L (ref 39–117)
BUN: 14 mg/dL (ref 6–23)
CO2: 27 mEq/L (ref 19–32)
Calcium: 9.5 mg/dL (ref 8.4–10.5)
Chloride: 101 mEq/L (ref 96–112)
Creatinine, Ser: 0.73 mg/dL (ref 0.40–1.20)
GFR: 97.22 mL/min (ref >60.00)
Glucose, Bld: 90 mg/dL (ref 70–99)
Potassium: 4 mEq/L (ref 3.5–5.1)
Sodium: 136 mEq/L (ref 135–145)
Total Bilirubin: 0.6 mg/dL (ref 0.2–1.2)
Total Protein: 7.4 g/dL (ref 6.0–8.3)

## 2015-05-24 LAB — CBC WITH DIFFERENTIAL/PLATELET
Basophils Absolute: 0 10*3/uL (ref 0.0–0.1)
Basophils Relative: 0.4 % (ref 0.0–3.0)
Eosinophils Absolute: 0.2 10*3/uL (ref 0.0–0.7)
Eosinophils Relative: 2 % (ref 0.0–5.0)
HCT: 35.6 % — ABNORMAL LOW (ref 36.0–46.0)
Hemoglobin: 11.8 g/dL — ABNORMAL LOW (ref 12.0–15.0)
Lymphocytes Relative: 23.8 % (ref 12.0–46.0)
Lymphs Abs: 2.9 10*3/uL (ref 0.7–4.0)
MCHC: 33.3 g/dL (ref 30.0–36.0)
MCV: 84.5 fl (ref 78.0–100.0)
Monocytes Absolute: 0.5 10*3/uL (ref 0.1–1.0)
Monocytes Relative: 4.2 % (ref 3.0–12.0)
Neutro Abs: 8.5 10*3/uL — ABNORMAL HIGH (ref 1.4–7.7)
Neutrophils Relative %: 69.6 % (ref 43.0–77.0)
Platelets: 342 10*3/uL (ref 150.0–400.0)
RBC: 4.21 Mil/uL (ref 3.87–5.11)
RDW: 14.4 % (ref 11.5–15.5)
WBC: 12.2 10*3/uL — ABNORMAL HIGH (ref 4.0–10.5)

## 2015-05-24 LAB — SEDIMENTATION RATE: Sed Rate: 37 mm/hr — ABNORMAL HIGH (ref 0–22)

## 2015-05-24 MED ORDER — METHYLPREDNISOLONE 4 MG PO TBPK: ORAL_TABLET | ORAL | Status: DC

## 2015-05-24 NOTE — Progress Notes (Signed)
Pre visit review using our clinic review tool, if applicable. No additional management support is needed unless otherwise documented below in the visit note. 

## 2015-05-24 NOTE — Patient Instructions (Signed)

## 2015-05-24 NOTE — Progress Notes (Signed)
Subjective:  Patient ID: Julie Mcneil, female    DOB: 09/18/1981  Age: 34 y.o. MRN: [id number]  CC: Headache and Neck Pain   HPI Julie Sanelizabeth A Vanderschaaf presents for evaluation of headache and neck pain. She is new to me. She has been seen by other providers over the last 3-4 weeks with a complaint of right sided headache that she describes as a pressure sensation. She also has right-sided neck pain that radiates up into the back of her scalp and towards her right shoulder. She's had occasional episode of nausea and burping but she has not vomited. She's not had any trouble with her vision or hearing and she denies any numbness, weakness, tingling. There has been no slurred speech or ataxia. She had a CT scan done a week ago and it was normal. She has tried Fiorinal which did not help. She tried sumatriptan which helps some. She tells me that ibuprofen helps as well.  Outpatient Prescriptions Prior to Visit  Medication Sig Dispense Refill  . ibuprofen (ADVIL,MOTRIN) 200 MG tablet Take 3 tablets (600 mg total) by mouth every 6 (six) hours as needed for fever or mild pain. 30 tablet 1  . butalbital-aspirin-caffeine (FIORINAL) 50-325-40 MG per capsule Take 1 capsule by mouth 2 (two) times daily as needed for headache. 30 capsule 0  . ondansetron (ZOFRAN-ODT) 4 MG disintegrating tablet Take 1 tablet (4 mg total) by mouth every 8 (eight) hours as needed for nausea or vomiting. (Patient not taking: Reported on 05/14/2015) 20 tablet 0  . SUMAtriptan (IMITREX) 50 MG tablet Take 1 tablet (50 mg total) by mouth every 2 (two) hours as needed for migraine. May repeat in 2 hours if headache persists or recurs. 10 tablet 0   No facility-administered medications prior to visit.    ROS Review of Systems  Constitutional: Positive for fatigue. Negative for fever, chills, diaphoresis, activity change, appetite change and unexpected weight change.  HENT: Negative for congestion, facial swelling, postnasal drip,  rhinorrhea, sinus pressure, sneezing, sore throat, trouble swallowing and voice change.   Eyes: Negative.  Negative for photophobia, redness and visual disturbance.  Respiratory: Negative.  Negative for cough, choking, chest tightness, shortness of breath and stridor.   Cardiovascular: Negative.  Negative for chest pain, palpitations and leg swelling.  Gastrointestinal: Positive for nausea. Negative for vomiting, abdominal pain, diarrhea, constipation, blood in stool, abdominal distention, anal bleeding and rectal pain.  Endocrine: Negative.   Genitourinary: Negative.   Musculoskeletal: Positive for neck pain. Negative for back pain and arthralgias.  Skin: Negative.  Negative for rash.  Neurological: Positive for headaches. Negative for dizziness, tremors, seizures, syncope, facial asymmetry, speech difficulty, weakness, light-headedness and numbness.  Hematological: Negative.  Negative for adenopathy. Does not bruise/bleed easily.  Psychiatric/Behavioral: Negative.     Objective:  BP 126/72 mmHg  Pulse 84  Temp(Src) 98.7 F (37.1 C) (Oral)  Resp 16  Ht 5\' 4"  (1.626 m)  Wt 277 lb (125.646 kg)  BMI 47.52 kg/m2  SpO2 98%  LMP 04/29/2015  BP Readings from Last 3 Encounters:  05/24/15 126/72  05/17/15 148/100  05/14/15 132/86    Wt Readings from Last 3 Encounters:  05/24/15 277 lb (125.646 kg)  05/17/15 274 lb 14.4 oz (124.694 kg)  05/08/15 275 lb 4 oz (124.853 kg)    Physical Exam  Constitutional: She is oriented to person, place, and time. She appears well-developed and well-nourished.  Non-toxic appearance. She does not have a sickly appearance. She does not appear  ill. No distress.  HENT:  Mouth/Throat: Oropharynx is clear and moist. No oropharyngeal exudate.  Eyes: Conjunctivae and EOM are normal. Pupils are equal, round, and reactive to light. Right eye exhibits no discharge. Left eye exhibits no discharge. No scleral icterus.  Neck: Normal range of motion. Neck supple.  No JVD present. No tracheal deviation present. No thyromegaly present.  Cardiovascular: Normal rate, regular rhythm, normal heart sounds and intact distal pulses.  Exam reveals no gallop and no friction rub.   No murmur heard. Pulmonary/Chest: Effort normal and breath sounds normal. No stridor. No respiratory distress. She has no wheezes. She has no rales. She exhibits no tenderness.  Abdominal: Soft. Bowel sounds are normal. She exhibits no distension and no mass. There is no tenderness. There is no rebound and no guarding.  Musculoskeletal: Normal range of motion. She exhibits no edema or tenderness.       Cervical back: Normal. She exhibits normal range of motion, no tenderness, no bony tenderness, no swelling, no edema, no deformity, no laceration, no pain, no spasm and normal pulse.  Lymphadenopathy:    She has no cervical adenopathy.  Neurological: She is alert and oriented to person, place, and time. She has normal strength. She displays no atrophy, no tremor and normal reflexes. No cranial nerve deficit or sensory deficit. She exhibits normal muscle tone. She displays no seizure activity. Coordination and gait normal. She displays no Babinski's sign on the right side. She displays no Babinski's sign on the left side.  Reflex Scores:      Tricep reflexes are 0 on the right side and 0 on the left side.      Bicep reflexes are 0 on the right side and 0 on the left side.      Brachioradialis reflexes are 0 on the right side and 0 on the left side.      Patellar reflexes are 0 on the right side and 0 on the left side.      Achilles reflexes are 0 on the right side and 0 on the left side. Skin: Skin is warm and dry. No rash noted. She is not diaphoretic. No erythema. No pallor.  Psychiatric: She has a normal mood and affect. Her behavior is normal. Judgment and thought content normal.  Vitals reviewed.   Lab Results  Component Value Date   WBC 12.2* 05/24/2015   HGB 11.8* 05/24/2015   HCT  35.6* 05/24/2015   PLT 342.0 05/24/2015   GLUCOSE 90 05/24/2015   CHOL 189 08/25/2013   TRIG 109 08/25/2013   HDL 54 08/25/2013   LDLCALC 113* 08/25/2013   ALT 15 05/24/2015   AST 14 05/24/2015   NA 136 05/24/2015   K 4.0 05/24/2015   CL 101 05/24/2015   CREATININE 0.73 05/24/2015   BUN 14 05/24/2015   CO2 27 05/24/2015   TSH 2.28 01/22/2014   HGBA1C 5.9 11/16/2014    Dg Cervical Spine Complete  05/24/2015   CLINICAL DATA:  Cervicalgia with right-sided radicular symptoms  EXAM: CERVICAL SPINE  4+ VIEWS  COMPARISON:  None.  FINDINGS: Frontal, lateral, open-mouth odontoid, and bilateral oblique views were obtained. There is no fracture or spondylolisthesis. Prevertebral soft tissues and predental space regions are normal. Disc spaces appear unremarkable. There is mild facet hypertrophy at C3-4 and C4-5 bilaterally and at C5-6 on the left.  IMPRESSION: Mild facet osteoarthritic changes several levels. No fracture or spondylolisthesis. No appreciable disc space narrowing.   Electronically Signed   By:  Bretta Bang III M.D.   On: 05/24/2015 16:17    Assessment & Plan:   Bianca was seen today for headache and neck pain.  Diagnoses and all orders for this visit:  Headache, unspecified headache type- her exam is normal, recent CT scan was normal, her labs are normal with the exception of a slightly elevated sedimentation rate but this is not suspicious for vasculitis. She has a protracted migraine headache and will try a course of steroids to reduce the pain and inflammation. Orders: -     methylPREDNISolone (MEDROL DOSEPAK) 4 MG TBPK tablet; TAKE AS DIRECTED -     Comprehensive metabolic panel; Future -     CBC with Differential/Platelet; Future -     Sedimentation rate; Future  Neck pain on right side- the plain films of her neck show mild facet osteoarthritic changes, this may be the cause of her neck and head pain. Will refer to sports medicine for further evaluation. Will try  a course of steroids for symptomatic relief. Orders: -     DG Cervical Spine Complete; Future   I have discontinued Ms. Naser ondansetron, SUMAtriptan, and butalbital-aspirin-caffeine. I am also having her start on methylPREDNISolone. Additionally, I am having her maintain her ibuprofen.  Meds ordered this encounter  Medications  . methylPREDNISolone (MEDROL DOSEPAK) 4 MG TBPK tablet    Sig: TAKE AS DIRECTED    Dispense:  21 tablet    Refill:  0     Follow-up: Return in about 1 week (around 05/31/2015).  Sanda Linger, MD

## 2015-06-04 ENCOUNTER — Ambulatory Visit (INDEPENDENT_AMBULATORY_CARE_PROVIDER_SITE_OTHER): Payer: 59 | Admitting: Women's Health

## 2015-06-04 ENCOUNTER — Encounter: Payer: Self-pay | Admitting: Women's Health

## 2015-06-04 VITALS — BP 120/84 | Ht 64.0 in | Wt 270.0 lb

## 2015-06-04 DIAGNOSIS — N912 Amenorrhea, unspecified: Secondary | ICD-10-CM | POA: Diagnosis not present

## 2015-06-04 LAB — HCG, SERUM, QUALITATIVE: Preg, Serum: NEGATIVE

## 2015-06-04 LAB — PREGNANCY, URINE: Preg Test, Ur: NEGATIVE

## 2015-06-04 NOTE — Patient Instructions (Signed)
Secondary Amenorrhea  Secondary amenorrhea is the stopping of menstrual flow for 3-6 months in a female who has previously had periods. There are many possible causes. Most of these causes are not serious. Usually, treating the underlying problem causing the loss of menses will return your periods to normal. CAUSES  Some common and uncommon causes of not menstruating include:  Malnutrition.  Low blood sugar (hypoglycemia).  Polycystic ovary disease.  Stress or fear.  Breastfeeding.  Hormone imbalance.  Ovarian failure.  Medicines.  Extreme obesity.  Cystic fibrosis.  Low body weight or drastic weight reduction from any cause.  Early menopause.  Removal of ovaries or uterus.  Contraceptives.  Illness.  Long-term (chronic) illnesses.  Cushing syndrome.  Thyroid problems.  Birth control pills, patches, or vaginal rings for birth control. RISK FACTORS You may be at greater risk of secondary amenorrhea if:  You have a family history of this condition.  You have an eating disorder.  You do athletic training. DIAGNOSIS  A diagnosis is made by your health care provider taking a medical history and doing a physical exam. This will include a pelvic exam to check for problems with your reproductive organs. Pregnancy must be ruled out. Often, numerous blood tests are done to measure different hormones in the body. Urine testing may be done. Specialized exams (ultrasound, CT scan, MRI, or hysteroscopy) may have to be done as well as measuring the body mass index (BMI). TREATMENT  Treatment depends on the cause of the amenorrhea. If an eating disorder is present, this can be treated with an adequate diet and therapy. Chronic illnesses may improve with treatment of the illness. Amenorrhea may be corrected with medicines, lifestyle changes, or surgery. If the amenorrhea cannot be corrected, it is sometimes possible to create a false menstruation with medicines. HOME CARE  INSTRUCTIONS  Maintain a healthy diet.  Manage weight problems.  Exercise regularly but not excessively.  Get adequate sleep.  Manage stress.  Be aware of changes in your menstrual cycle. Keep a record of when your periods occur. Note the date your period starts, how long it lasts, and any problems. SEEK MEDICAL CARE IF: Your symptoms do not get better with treatment. Document Released: 12/04/2006 Document Revised: 06/25/2013 Document Reviewed: 04/10/2013 [employer] Patient Information 2015 [employer]. This information is not intended to replace advice given to you by your health care provider. Make sure you discuss any questions you have with your health care provider.  

## 2015-06-04 NOTE — Progress Notes (Signed)
Patient ID: Julie Mcneil, female   DOB: [birth year], 34 y.o.   MRN: [id number] Presents with complaint of questionable positive UPT 1 with 2 negative UPT's. Took a picture of it on her phone - Faint line. BTL cycles every 4-6 weeks currently at 5 weeks. States is worried about  ectopic pregnancy. Has had mild nausea, breast tenderness. States has noted scant pink on toilet tissue. Denies vaginal discharge, urinary symptoms or abdominal pain  3 daughters ages 28, 73, 5 all doing well.  Exam: Appears well, obese, external genitalia within normal limits, speculum exam no visible blood or discharge noted, bimanual limited abdominal girth. UPT negative  Irregular cycle Anxiety  Plan: Qualitative hCG. Reviewed will triage based on hCG, most likely not pregnant. Will watch for cycle if no bleeding within 2 weeks instructed to call. Reviewed her symptoms may be premenstrual.

## 2015-06-14 ENCOUNTER — Encounter: Payer: Self-pay | Admitting: Family Medicine

## 2015-06-14 ENCOUNTER — Ambulatory Visit (INDEPENDENT_AMBULATORY_CARE_PROVIDER_SITE_OTHER): Payer: 59 | Admitting: Family Medicine

## 2015-06-14 VITALS — BP 116/82 | HR 77 | Ht 64.0 in | Wt 273.0 lb

## 2015-06-14 DIAGNOSIS — R51 Headache: Secondary | ICD-10-CM

## 2015-06-14 DIAGNOSIS — M999 Biomechanical lesion, unspecified: Secondary | ICD-10-CM | POA: Insufficient documentation

## 2015-06-14 DIAGNOSIS — M9901 Segmental and somatic dysfunction of cervical region: Secondary | ICD-10-CM | POA: Diagnosis not present

## 2015-06-14 DIAGNOSIS — M9902 Segmental and somatic dysfunction of thoracic region: Secondary | ICD-10-CM | POA: Diagnosis not present

## 2015-06-14 DIAGNOSIS — G4486 Cervicogenic headache: Secondary | ICD-10-CM | POA: Insufficient documentation

## 2015-06-14 MED ORDER — CYCLOBENZAPRINE HCL 10 MG PO TABS: 10.0000 mg | ORAL_TABLET | Freq: Every day | ORAL | Status: DC

## 2015-06-14 NOTE — Progress Notes (Signed)
[facility] [address] [place], [place] [address] Phone: [phone] Subjective:    Referred from Rene Paci, MD  CC: Neck pain and headache   [id number] Julie Mcneil is a 34 y.o. female coming in with complaint of  Patient does have been having headaches for approximately 2 months. Patient has had lab workup as well as some x-rays. These were reviewed by me. Patient's x-ray showed mild osteophytic changes. Patient states that it seems to be on the right side. At the base of the skull. Causing more of a headache. Patient states that it's more of a constant dull throbbing aching sensation that has not been responding well to medications at this time. Patient has tried anti-inflammatory's. Patient rates the severity of pain a 6 out of 10. Denies any visual changes or any photophobia. Denies any nighttime awakening.  Past Medical History  Diagnosis Date  . Vaginal yeast infection     recurrent  . UTI (urinary tract infection)     recurrent  . GERD (gastroesophageal reflux disease)   . Obesity   . Anxiety     Panic attacks  . Cervical dysplasia    Past Surgical History  Procedure Laterality Date  . Cesarean section  [id number]    11-28-11  . Tubal ligation      2013 C-SECTION  . Colposcopy     History  Substance Use Topics  . Smoking status: Never Smoker   . Smokeless tobacco: Never Used  . Alcohol Use: No   Allergies  Allergen Reactions  . Nitrofurantoin Monohyd Macro Hives and Nausea And Vomiting   Family History  Problem Relation Age of Onset  . Anesthesia problems Neg Hx   . Hypotension Neg Hx   . Malignant hyperthermia Neg Hx   . Pseudochol deficiency Neg Hx   . Early death Mother   . Heart disease Maternal Grandmother   . Diabetes Maternal Grandmother   . Cancer Maternal Grandmother     Lymphoma  . Hypertension Paternal Grandmother   . Hypertension Paternal Grandfather   . Stroke Paternal Grandmother         Past medical history, social, surgical and family history all reviewed in electronic medical record.   Review of Systems: No headache, visual changes, nausea, vomiting, diarrhea, constipation, dizziness, abdominal pain, skin rash, fevers, chills, night sweats, weight loss, swollen lymph nodes, body aches, joint swelling, muscle aches, chest pain, shortness of breath, mood changes.   Objective Blood pressure 116/82, pulse 77, height 5\' 4"  (1.626 m), weight 273 lb (123.832 kg), SpO2 99 %.  General: No apparent distress alert and oriented x3 mood and affect normal, dressed appropriately.  HEENT: Pupils equal, extraocular movements intact  Respiratory: Patient's speak in full sentences and does not appear short of breath  Cardiovascular: No lower extremity edema, non tender, no erythema  Skin: Warm dry intact with no signs of infection or rash on extremities or on axial skeleton.  Abdomen: Soft nontender  Neuro: Cranial nerves II through XII are intact, neurovascularly intact in all extremities with 2+ DTRs and 2+ pulses.  Lymph: No lymphadenopathy of posterior or anterior cervical chain or axillae bilaterally.  Gait normal with good balance and coordination.  MSK:  Non tender with full range of motion and good stability and symmetric strength and tone of shoulders, elbows, wrist, hip, knee and ankles bilaterally.  Neck: Inspection unremarkable. No palpable stepoffs. Negative Spurling's maneuver. Full neck range of motion mild tightness noted on  the right paraspinal musculature of the cervical spine. Grip strength and sensation normal in bilateral hands Strength good C4 to T1 distribution No sensory change to C4 to T1 Negative Hoffman sign bilaterally Reflexes normal Osteopathic findings C2 flexed rotated and side bent right C4 flexed rotated and side bent left T3 extended rotated and side bent right  Procedure note 97110; 15 minutes spent for Therapeutic exercises as stated in above  notes.  This included exercises focusing on stretching, strengthening, with significant focus on eccentric aspects. Basic scapular stabilization to include adduction and depression of scapula Scaption, focusing on proper movement and good control Internal and External rotation utilizing a theraband, with elbow tucked at side entire time Rows with theraband  Proper technique shown and discussed handout in great detail with ATC.  All questions were discussed and answered.     Impression and Recommendations:     This case required medical decision making of moderate complexity.

## 2015-06-14 NOTE — Progress Notes (Signed)
Pre visit review using our clinic review tool, if applicable. No additional management support is needed unless otherwise documented below in the visit note. 

## 2015-06-14 NOTE — Assessment & Plan Note (Signed)
I do believe the patient is having more of a cervicogenic headache. I do think that this is likely coming from patient's poor posture. Patient was given postural exercises and scapular strengthening exercises. We discussed range of motion exercises and changes in diet as well as over-the-counter natural supplementations. Patient given a muscle relaxer to take at night if necessary. Patient will see if she makes any improvement with this and come back again in 3-4 weeks.

## 2015-06-14 NOTE — Patient Instructions (Addendum)
Good to see you Julie Mcneil 20 minutes 2 times daily. Usually after activity and before bed. Flexeril at night 2 teniis ball in tube sock and lay on them where head meets neck. Stand on wall with heels, butt shoulder and head touching for a gol of 5 minutes day.  pennsaid pinkie amount topically 2 times daily as needed.  Vitamin D 2000 iU daily [place] 500mg  twice daily Exercises 3 times a week.  See me again in 3 weeks.    Standing:  Secure a rubber exercise band/tubing so that it is at the height of your shoulders when you are either standing or sitting on a firm arm-less chair.  Grasp an end of the band/tubing in each hand and have your palms face each other. Straighten your elbows and lift your hands straight in front of you at shoulder height. Step back away from the secured end of band/tubing until it becomes tense.  Squeeze your shoulder blades together. Keeping your elbows locked and your hands at shoulder-height, bring your hands out to your side.  Hold __________ seconds. Slowly ease the tension on the band/tubing as you reverse the directions and return to the starting position. Repeat __________ times. Complete this exercise __________ times per day. STRENGTH - Scapular Retractors  Secure a rubber exercise band/tubing so that it is at the height of your shoulders when you are either standing or sitting on a firm arm-less chair.  With a palm-down grip, grasp an end of the band/tubing in each hand. Straighten your elbows and lift your hands straight in front of you at shoulder height. Step back away from the secured end of band/tubing until it becomes tense.  Squeezing your shoulder blades together, draw your elbows back as you bend them. Keep your upper arm lifted away from your body throughout the exercise.  Hold __________ seconds. Slowly ease the tension on the band/tubing as you reverse the directions and return to the starting position. Repeat __________ times. Complete this  exercise __________ times per day. STRENGTH - Shoulder Extensors   Secure a rubber exercise band/tubing so that it is at the height of your shoulders when you are either standing or sitting on a firm arm-less chair.  With a thumbs-up grip, grasp an end of the band/tubing in each hand. Straighten your elbows and lift your hands straight in front of you at shoulder height. Step back away from the secured end of band/tubing until it becomes tense.  Squeezing your shoulder blades together, pull your hands down to the sides of your thighs. Do not allow your hands to go behind you.  Hold for __________ seconds. Slowly ease the tension on the band/tubing as you reverse the directions and return to the starting position. Repeat __________ times. Complete this exercise __________ times per day.  STRENGTH - Scapular Retractors and External Rotators  Secure a rubber exercise band/tubing so that it is at the height of your shoulders when you are either standing or sitting on a firm arm-less chair.  With a palm-down grip, grasp an end of the band/tubing in each hand. Bend your elbows 90 degrees and lift your elbows to shoulder height at your sides. Step back away from the secured end of band/tubing until it becomes tense.  Squeezing your shoulder blades together, rotate your shoulder so that your upper arm and elbow remain stationary, but your fists travel upward to head-height.  Hold __________ for seconds. Slowly ease the tension on the band/tubing as you reverse the directions and  return to the starting position. Repeat __________ times. Complete this exercise __________ times per day.  STRENGTH - Scapular Retractors and External Rotators, Rowing  Secure a rubber exercise band/tubing so that it is at the height of your shoulders when you are either standing or sitting on a firm arm-less chair.  With a palm-down grip, grasp an end of the band/tubing in each hand. Straighten your elbows and lift your  hands straight in front of you at shoulder height. Step back away from the secured end of band/tubing until it becomes tense.  Step 1: Squeeze your shoulder blades together. Bending your elbows, draw your hands to your chest as if you are rowing a boat. At the end of this motion, your hands and elbow should be at shoulder-height and your elbows should be out to your sides.  Step 2: Rotate your shoulder to raise your hands above your head. Your forearms should be vertical and your upper-arms should be horizontal.  Hold for __________ seconds. Slowly ease the tension on the band/tubing as you reverse the directions and return to the starting position. Repeat __________ times. Complete this exercise __________ times per day.  STRENGTH - Scapular Retractors and Elevators  Secure a rubber exercise band/tubing so that it is at the height of your shoulders when you are either standing or sitting on a firm arm-less chair.  With a thumbs-up grip, grasp an end of the band/tubing in each hand. Step back away from the secured end of band/tubing until it becomes tense.  Squeezing your shoulder blades together, straighten your elbows and lift your hands straight over your head.  Hold for __________ seconds. Slowly ease the tension on the band/tubing as you reverse the directions and return to the starting position. Repeat __________ times. Complete this exercise __________ times per day.  Document Released: 10/23/2005 Document Revised: 01/15/2012 Document Reviewed: 02/04/2009 [facility] Patient Information 2015 [place], [place]. This information is not intended to replace advice given to you by your health care provider. Make sure you discuss any questions you have with your health care provider.

## 2015-06-14 NOTE — Assessment & Plan Note (Signed)
Decision today to treat with OMT was based on Physical Exam  After verbal consent patient was treated with HVLA, ME, FPR techniques in cervical, thoracic areas  Patient tolerated the procedure well with improvement in symptoms  Patient given exercises, stretches and lifestyle modifications  See medications in patient instructions if given  Patient will follow up in 3 weeks      

## 2015-06-29 ENCOUNTER — Emergency Department (HOSPITAL_COMMUNITY)
Admission: EM | Admit: 2015-06-29 | Discharge: 2015-06-29 | Disposition: A | Discharge status: Home / Self Care | Payer: 59 | Attending: Emergency Medicine | Admitting: Emergency Medicine

## 2015-06-29 ENCOUNTER — Encounter (HOSPITAL_COMMUNITY): Payer: Self-pay | Admitting: Emergency Medicine

## 2015-06-29 DIAGNOSIS — R42 Dizziness and giddiness: Secondary | ICD-10-CM | POA: Insufficient documentation

## 2015-06-29 DIAGNOSIS — R002 Palpitations: Secondary | ICD-10-CM | POA: Insufficient documentation

## 2015-06-29 DIAGNOSIS — Z3202 Encounter for pregnancy test, result negative: Secondary | ICD-10-CM | POA: Insufficient documentation

## 2015-06-29 DIAGNOSIS — Z8742 Personal history of other diseases of the female genital tract: Secondary | ICD-10-CM | POA: Insufficient documentation

## 2015-06-29 DIAGNOSIS — Z8659 Personal history of other mental and behavioral disorders: Secondary | ICD-10-CM | POA: Insufficient documentation

## 2015-06-29 DIAGNOSIS — E669 Obesity, unspecified: Secondary | ICD-10-CM | POA: Insufficient documentation

## 2015-06-29 DIAGNOSIS — Z87440 Personal history of urinary (tract) infections: Secondary | ICD-10-CM | POA: Insufficient documentation

## 2015-06-29 DIAGNOSIS — Z8619 Personal history of other infectious and parasitic diseases: Secondary | ICD-10-CM | POA: Insufficient documentation

## 2015-06-29 DIAGNOSIS — Z8719 Personal history of other diseases of the digestive system: Secondary | ICD-10-CM | POA: Insufficient documentation

## 2015-06-29 LAB — URINALYSIS, ROUTINE W REFLEX MICROSCOPIC
Bilirubin Urine: NEGATIVE
Glucose, UA: NEGATIVE mg/dL
Ketones, ur: NEGATIVE mg/dL
Leukocytes, UA: NEGATIVE
Nitrite: NEGATIVE
Protein, ur: NEGATIVE mg/dL
Specific Gravity, Urine: 1.007 (ref 1.005–1.030)
Urobilinogen, UA: 0.2 mg/dL (ref 0.0–1.0)
pH: 5.5 (ref 5.0–8.0)

## 2015-06-29 LAB — BASIC METABOLIC PANEL
Anion gap: 6 (ref 5–15)
BUN: 11 mg/dL (ref 6–20)
CO2: 26 mmol/L (ref 22–32)
Calcium: 9.1 mg/dL (ref 8.9–10.3)
Chloride: 107 mmol/L (ref 101–111)
Creatinine, Ser: 0.78 mg/dL (ref 0.44–1.00)
GFR calc Af Amer: >60 mL/min (ref >60)
GFR calc non Af Amer: >60 mL/min (ref >60)
Glucose, Bld: 103 mg/dL — ABNORMAL HIGH (ref 65–99)
Potassium: 4 mmol/L (ref 3.5–5.1)
Sodium: 139 mmol/L (ref 135–145)

## 2015-06-29 LAB — CBC
HCT: 35.3 % — ABNORMAL LOW (ref 36.0–46.0)
Hemoglobin: 12.8 g/dL (ref 12.0–15.0)
MCH: 31.8 pg (ref 26.0–34.0)
MCHC: 36.3 g/dL — ABNORMAL HIGH (ref 30.0–36.0)
MCV: 87.6 fL (ref 78.0–100.0)
Platelets: 291 10*3/uL (ref 150–400)
RBC: 4.03 MIL/uL (ref 3.87–5.11)
RDW: 13.7 % (ref 11.5–15.5)
WBC: 11.2 10*3/uL — ABNORMAL HIGH (ref 4.0–10.5)

## 2015-06-29 LAB — URINE MICROSCOPIC-ADD ON

## 2015-06-29 LAB — I-STAT TROPONIN, ED: Troponin i, poc: 0 ng/mL (ref 0.00–0.08)

## 2015-06-29 LAB — T4, FREE: Free T4: 0.65 ng/dL (ref 0.61–1.12)

## 2015-06-29 LAB — PHOSPHORUS: Phosphorus: 4 mg/dL (ref 2.5–4.6)

## 2015-06-29 LAB — MAGNESIUM: Magnesium: 2.2 mg/dL (ref 1.7–2.4)

## 2015-06-29 LAB — CBG MONITORING, ED: Glucose-Capillary: 100 mg/dL — ABNORMAL HIGH (ref 65–99)

## 2015-06-29 LAB — TSH: TSH: 2.262 u[IU]/mL (ref 0.350–4.500)

## 2015-06-29 LAB — I-STAT BETA HCG BLOOD, ED (MC, WL, AP ONLY): I-stat hCG, quantitative: <5 m[IU]/mL (ref <5)

## 2015-06-29 MED ORDER — SODIUM CHLORIDE 0.9 % IV BOLUS (SEPSIS)
1000.0000 mL | Freq: Once | INTRAVENOUS | Status: AC
  Administered 2015-06-29: 1000 mL via INTRAVENOUS

## 2015-06-29 MED ORDER — MECLIZINE HCL 25 MG PO TABS
25.0000 mg | ORAL_TABLET | Freq: Once | ORAL | Status: AC
  Administered 2015-06-29: 25 mg via ORAL
  Filled 2015-06-29: qty 1

## 2015-06-29 MED ORDER — MECLIZINE HCL 50 MG PO TABS: 50.0000 mg | ORAL_TABLET | Freq: Three times a day (TID) | ORAL | Status: DC | PRN

## 2015-06-29 NOTE — ED Notes (Signed)
Pt reports feeling "unsteadiness but not dizziness" starting Sunday. Denies chest pain and SOB. Says, "I feel like I need to pass out and it's like I'm going to pass out." Also says, "I'm not short of breath, it just feels like when I take a deep breath in that my heart is fluttering." Was at [facility] Sunday but did not immerse her head in the water. Speaking full/clear sentences. RR even/unlabored. No other c/c.

## 2015-06-29 NOTE — ED Provider Notes (Signed)
CSN: [id number]     Arrival date & time 06/29/15  1144 History   First MD Initiated Contact with Patient 06/29/15 1205     Chief Complaint  Patient presents with  . Palpitations  . Dizziness   (Consider location/radiation/quality/duration/timing/severity/associated sxs/prior Treatment)  HPI  34 year old female who presents with dizziness and palpations. She is otherwise healthy. Reports that two days ago, developed sensation of intermittent dizziness. Difficult for patient to describe, but states that at times she feels "motion sickness, like I'm unsteady" with feeling of imbalance. This is associated with her "heart feeling flip floppy." Reports seeing PCP about this one year ago, and reports not fully figuring out what had caused it. Reports having on and off cold symptoms recently, but currently denies fevers, cough, difficulty breathing, or chest pain. Denies vomiting, abdominal pain, urinary symptoms, diarrhea, melena, hematochezia. Denies headache, vision changes, speech changes, weakness/numbness, ataxia.  Past Medical History  Diagnosis Date  . Vaginal yeast infection     recurrent  . UTI (urinary tract infection)     recurrent  . GERD (gastroesophageal reflux disease)   . Obesity   . Anxiety     Panic attacks  . Cervical dysplasia    Past Surgical History  Procedure Laterality Date  . Cesarean section  4098,1191    11-28-11  . Tubal ligation      2013 C-SECTION  . Colposcopy     Family History  Problem Relation Age of Onset  . Anesthesia problems Neg Hx   . Hypotension Neg Hx   . Malignant hyperthermia Neg Hx   . Pseudochol deficiency Neg Hx   . Early death Mother   . Heart disease Maternal Grandmother   . Diabetes Maternal Grandmother   . Cancer Maternal Grandmother     Lymphoma  . Hypertension Paternal Grandmother   . Hypertension Paternal Grandfather   . Stroke Paternal Grandmother    Social History  Substance Use Topics  . Smoking status: Never Smoker   .  Smokeless tobacco: Never Used  . Alcohol Use: No   OB History    Gravida Para Term Preterm AB TAB SAB Ectopic Multiple Living   0 1 0 0 1 0 3     Review of Systems 10/14 systems reviewed and are negative other than those stated in the HPI  Allergies  Sumatriptan and Nitrofurantoin monohyd macro  Home Medications   Prior to Admission medications   Medication Sig Start Date End Date Taking? Authorizing Provider  ibuprofen (ADVIL,MOTRIN) 200 MG tablet Take 3 tablets (600 mg total) by mouth every 6 (six) hours as needed for fever or mild pain. 05/14/15  Yes Harrington Challenger, NP  cyclobenzaprine (FLEXERIL) 10 MG tablet Take 1 tablet (10 mg total) by mouth at bedtime. Patient not taking: Reported on 06/29/2015 06/14/15   Judi Saa, DO  meclizine (ANTIVERT) 50 MG tablet Take 1 tablet (50 mg total) by mouth 3 (three) times daily as needed for dizziness. 06/29/15   Lavera Guise, MD   BP 136/84 mmHg  Pulse 84  Temp(Src) 98.3 F (36.8 C) (Oral)  Resp 15  SpO2 100%  LMP 06/06/2015 (Approximate) Physical Exam Physical Exam  Nursing note and vitals reviewed. Constitutional: Well developed, well nourished, non-toxic, and in no acute distress Head: Normocephalic and atraumatic.  Mouth/Throat: Oropharynx is clear and moist.  Neck: Normal range of motion. Neck supple.  Cardiovascular: Normal rate and regular rhythm.  No edema. Pulmonary/Chest: Effort normal  and breath sounds normal.  Abdominal: Soft. There is no tenderness. There is no rebound and no guarding.  Musculoskeletal: Normal range of motion.  Skin: Skin is warm and dry.  Psychiatric: Cooperative Neurological:  Alert, oriented to person, place, time, and situation. Memory grossly in tact. Fluent speech. No dysarthria or aphasia.  Cranial nerves: VF are full. Pupils are symmetric, and reactive to light. EOMI without nystagmus. No gaze deviation. Facial muscles symmetric with activation. Sensation to light touch over face in tact  bilaterally. Hearing grossly in tact. Palate elevates symmetrically. Head turn and shoulder shrug are intact. Tongue midline.  Muscle bulk and tone normal. No pronator drift. Moves all extremities symmetrically. Sensation to light touch is in tact throughout in bilateral upper and lower extremities. Coordination reveals no dysmetria with finger to nose. Gait is narrow-based and steady. Non-ataxic.   ED Course  Procedures (including critical care time) Labs Review Labs Reviewed  BASIC METABOLIC PANEL - Abnormal; Notable for the following:    Glucose, Bld 103 (*)    All other components within normal limits  CBC - Abnormal; Notable for the following:    WBC 11.2 (*)    HCT 35.3 (*)    MCHC 36.3 (*)    All other components within normal limits  URINALYSIS, ROUTINE W REFLEX MICROSCOPIC (NOT AT [facility]) - Abnormal; Notable for the following:    Hgb urine dipstick MODERATE (*)    All other components within normal limits  CBG MONITORING, ED - Abnormal; Notable for the following:    Glucose-Capillary 100 (*)    All other components within normal limits  TSH  T4, FREE  MAGNESIUM  PHOSPHORUS  URINE MICROSCOPIC-ADD ON  I-STAT TROPOININ, ED  I-STAT BETA HCG BLOOD, ED ([facility], [facility], [facility] ONLY)    EKG Interpretation  Date/Time:  Tuesday June 29 2015 12:02:34 EDT Ventricular Rate:  82 PR Interval:  164 QRS Duration: 95 QT Interval:  381 QTC Calculation: 445 R Axis:   75 Text Interpretation:  Sinus rhythm Low voltage, precordial leads Baseline wander in lead(s) I V2 No significant change since last tracing Confirmed by LIU MD, Annabelle Harman ([id number]) on 06/29/2015 7:50:34 PM Also confirmed by Verdie Mosher MD, DANA ([id number])  on 06/29/2015 7:51:51 PM        MDM   Final diagnoses:  Dizziness    In short, this is a 34 year old female who presents with palpitations and dizziness. She is non-toxic on presentation and in no acute distress. Vital signs are within normal limits and she has normal orthostatic vital  signs. EKG shows normal sinus rhythm, without stigmata of arrhythmia. Exam including cardiopulmonary exam and neuro exam unremarkable. History difficult discern from lightheadedness vs vertigo. She does report brief symptoms while using restroom in ED, but no further occurrence while on monitor in the ED or with ambulation in her room. No electrolyte or metabolic derangements noted. Remainder of blood work unremarkable. No events while on the cardiac monitor. Does not seem cardiogenic in nature, but discussed potential holter monitoring with patient with PCP. IN recent setting of URI, may be vestibular neuritis or other peripheral vertigo. No concerning features on exam or history concerning for central vertigo, and she is able to ambulate steadily. At this time, low suspicion for serious or toxic etiology of symptoms. She will follow-up with PCP for persistent symptoms. Strict return and follow-up instructions reviewed. She expressed understanding of all discharge instructions and felt comfortable with the plan of care.     Annabelle Harman  Gearldine Bienenstock, MD 06/29/15 2006

## 2015-06-29 NOTE — ED Notes (Signed)
Attempted IV access x2, no success.

## 2015-06-29 NOTE — Discharge Instructions (Signed)
Please follow-up with your primary care provider regarding further work-up, including potential holter monitoring. Return without fail for worsening symptoms, including passing out, chest pain, difficulty breathing, inability to walk, vision or speech changes, weakness or numbness, or any other symptoms concerning to you.  Dizziness Dizziness is a common problem. It is a feeling of unsteadiness or light-headedness. You may feel like you are about to faint. Dizziness can lead to injury if you stumble or fall. A person of any age group can suffer from dizziness, but dizziness is more common in older adults. CAUSES  Dizziness can be caused by many different things, including:  Middle ear problems.  Standing for too long.  Infections.  An allergic reaction.  Aging.  An emotional response to something, such as the sight of blood.  Side effects of medicines.  Tiredness.  Problems with circulation or blood pressure.  Excessive use of alcohol or medicines, or illegal drug use.  Breathing too fast (hyperventilation).  An irregular heart rhythm (arrhythmia).  A low red blood cell count (anemia).  Pregnancy.  Vomiting, diarrhea, fever, or other illnesses that cause body fluid loss (dehydration).  Diseases or conditions such as Parkinson's disease, high blood pressure (hypertension), diabetes, and thyroid problems.  Exposure to extreme heat. DIAGNOSIS  Your health care provider will ask about your symptoms, perform a physical exam, and perform an electrocardiogram (ECG) to record the electrical activity of your heart. Your health care provider may also perform other heart or blood tests to determine the cause of your dizziness. These may include:  Transthoracic echocardiogram (TTE). During echocardiography, sound waves are used to evaluate how blood flows through your heart.  Transesophageal echocardiogram (TEE).  Cardiac monitoring. This allows your health care provider to monitor  your heart rate and rhythm in real time.  Holter monitor. This is a portable device that records your heartbeat and can help diagnose heart arrhythmias. It allows your health care provider to track your heart activity for several days if needed.  Stress tests by exercise or by giving medicine that makes the heart beat faster. TREATMENT  Treatment of dizziness depends on the cause of your symptoms and can vary greatly. HOME CARE INSTRUCTIONS   Drink enough fluids to keep your urine clear or pale yellow. This is especially important in very hot weather. In older adults, it is also important in cold weather.  Take your medicine exactly as directed if your dizziness is caused by medicines. When taking blood pressure medicines, it is especially important to get up slowly.  Rise slowly from chairs and steady yourself until you feel okay.  In the morning, first sit up on the side of the bed. When you feel okay, stand slowly while holding onto something until you know your balance is fine.  Move your legs often if you need to stand in one place for a long time. Tighten and relax your muscles in your legs while standing.  Have someone stay with you for 1-2 days if dizziness continues to be a problem. Do this until you feel you are well enough to stay alone. Have the person call your health care provider if he or she notices changes in you that are concerning.  Do not drive or use heavy machinery if you feel dizzy.  Do not drink alcohol. SEEK IMMEDIATE MEDICAL CARE IF:   Your dizziness or light-headedness gets worse.  You feel nauseous or vomit.  You have problems talking, walking, or using your arms, hands, or legs.  You feel weak.  You are not thinking clearly or you have trouble forming sentences. It may take a friend or family member to notice this.  You have chest pain, abdominal pain, shortness of breath, or sweating.  Your vision changes.  You notice any bleeding.  You have  side effects from medicine that seems to be getting worse rather than better. MAKE SURE YOU:   Understand these instructions.  Will watch your condition.  Will get help right away if you are not doing well or get worse. Document Released: 04/18/2001 Document Revised: 10/28/2013 Document Reviewed: 05/12/2011 [facility] Patient Information 2015 [place], [place]. This information is not intended to replace advice given to you by your health care provider. Make sure you discuss any questions you have with your health care provider.

## 2015-07-02 ENCOUNTER — Telehealth: Payer: Self-pay | Admitting: *Deleted

## 2015-07-02 ENCOUNTER — Other Ambulatory Visit: Payer: Self-pay | Admitting: Women's Health

## 2015-07-02 MED ORDER — AMOXICILLIN 500 MG PO CAPS: 500.0000 mg | ORAL_CAPSULE | Freq: Three times a day (TID) | ORAL | Status: DC

## 2015-07-02 NOTE — Telephone Encounter (Signed)
Pt called c/o sinus infection yellowish drainage, nose congestion, was seen in ER for vertigo on 06/29/15. Pt PCP if out of the office this week, asked if you are willing to recommend Rx or something?

## 2015-07-02 NOTE — Telephone Encounter (Signed)
TC states has had increased nasal congestion jaw pain that radiates into her teeth has had sinus infections in the past, using over-the-counter Zyrtec with minimal relief, had several days of amoxicillin left ovary used which helped, then symptoms came back more intense after 2 days. States has not felt well for the past week, Zyrtec seen to help the vertigo more than the meclizine. Was seen in the Hospital for vertigo 3 days ago. Amoxicillin 500 3 times a day for 5 days will call in instructed to call if no relief.

## 2015-07-05 ENCOUNTER — Ambulatory Visit: Payer: 59 | Admitting: Family Medicine

## 2015-07-05 DIAGNOSIS — Z0289 Encounter for other administrative examinations: Secondary | ICD-10-CM

## 2015-07-07 ENCOUNTER — Encounter: Payer: 59 | Admitting: Women's Health

## 2015-07-28 ENCOUNTER — Encounter: Payer: Self-pay | Admitting: Women's Health

## 2015-07-28 ENCOUNTER — Ambulatory Visit (INDEPENDENT_AMBULATORY_CARE_PROVIDER_SITE_OTHER): Payer: 59 | Admitting: Women's Health

## 2015-07-28 VITALS — BP 128/80 | Ht 64.0 in | Wt 270.0 lb

## 2015-07-28 DIAGNOSIS — Z01419 Encounter for gynecological examination (general) (routine) without abnormal findings: Secondary | ICD-10-CM | POA: Diagnosis not present

## 2015-07-28 DIAGNOSIS — R739 Hyperglycemia, unspecified: Secondary | ICD-10-CM

## 2015-07-28 DIAGNOSIS — R7309 Other abnormal glucose: Secondary | ICD-10-CM

## 2015-07-28 NOTE — Patient Instructions (Signed)
Diabetes Mellitus and Food It is important for you to manage your blood sugar (glucose) level. Your blood glucose level can be greatly affected by what you eat. Eating healthier foods in the appropriate amounts throughout the day at about the same time each day will help you control your blood glucose level. It can also help slow or prevent worsening of your diabetes mellitus. Healthy eating may even help you improve the level of your blood pressure and reach or maintain a healthy weight.  HOW CAN FOOD AFFECT ME? Carbohydrates Carbohydrates affect your blood glucose level more than any other type of food. Your dietitian will help you determine how many carbohydrates to eat at each meal and teach you how to count carbohydrates. Counting carbohydrates is important to keep your blood glucose at a healthy level, especially if you are using insulin or taking certain medicines for diabetes mellitus. Alcohol Alcohol can cause sudden decreases in blood glucose (hypoglycemia), especially if you use insulin or take certain medicines for diabetes mellitus. Hypoglycemia can be a life-threatening condition. Symptoms of hypoglycemia (sleepiness, dizziness, and disorientation) are similar to symptoms of having too much alcohol.  If your health care provider has given you approval to drink alcohol, do so in moderation and use the following guidelines:  Women should not have more than one drink per day, and men should not have more than two drinks per day. One drink is equal to:  12 oz of beer.  5 oz of wine.  1 oz of hard liquor.  Do not drink on an empty stomach.  Keep yourself hydrated. Have water, diet soda, or unsweetened iced tea.  Regular soda, juice, and other mixers might contain a lot of carbohydrates and should be counted. WHAT FOODS ARE NOT RECOMMENDED? As you make food choices, it is important to remember that all foods are not the same. Some foods have fewer nutrients per serving than other  foods, even though they might have the same number of calories or carbohydrates. It is difficult to get your body what it needs when you eat foods with fewer nutrients. Examples of foods that you should avoid that are high in calories and carbohydrates but low in nutrients include:  Trans fats (most processed foods list trans fats on the Nutrition Facts label).  Regular soda.  Juice.  Candy.  Sweets, such as cake, pie, doughnuts, and cookies.  Fried foods. WHAT FOODS CAN I EAT? Have nutrient-rich foods, which will nourish your body and keep you healthy. The food you should eat also will depend on several factors, including:  The calories you need.  The medicines you take.  Your weight.  Your blood glucose level.  Your blood pressure level.  Your cholesterol level. You also should eat a variety of foods, including:  Protein, such as meat, poultry, fish, tofu, nuts, and seeds (lean animal proteins are best).  Fruits.  Vegetables.  Dairy products, such as milk, cheese, and yogurt (low fat is best).  Breads, grains, pasta, cereal, rice, and beans.  Fats such as olive oil, trans fat-free margarine, canola oil, avocado, and olives. DOES EVERYONE WITH DIABETES MELLITUS HAVE THE SAME MEAL PLAN? Because every person with diabetes mellitus is different, there is not one meal plan that works for everyone. It is very important that you meet with a dietitian who will help you create a meal plan that is just right for you. Document Released: 07/20/2005 Document Revised: 10/28/2013 Document Reviewed: 09/19/2013 [employer] Patient Information 2015 [employer]. This   information is not intended to replace advice given to you by your health care provider. Make sure you discuss any questions you have with your health care provider.  

## 2015-07-28 NOTE — Progress Notes (Signed)
Patient ID: Julie Mcneil, female   DOB: 04/07/1981, 34 y.o.   MRN: [id number] Presented for annual exam, reviewed had annual exam at  GYN office that delivered her last baby, 03/2015 and had a normal Pap. Reviewed annual exam not necessary today. Having regular monthly cycle/BTL. Denies any problems. History of hemoglobin A1c 5.9 in January 2016 at annual exam with primary care. Struggling with weight.  Exam: Appears well, obese.  History of elevated hemoglobin A1c Obesity Monthly cycle/BTL  Plan: Reviewed importance of weight loss for health, Weight Watchers reviewed and encouraged. We'll recheck hemoglobin A1c, annual GYN exam May 2017. Encourage daily 30 minute walk after getting children off to school.

## 2015-07-29 LAB — URINALYSIS W MICROSCOPIC + REFLEX CULTURE
Bacteria, UA: NONE SEEN [HPF]
Bilirubin Urine: NEGATIVE
Casts: NONE SEEN [LPF]
Crystals: NONE SEEN [HPF]
Glucose, UA: NEGATIVE
Ketones, ur: NEGATIVE
Leukocytes, UA: NEGATIVE
Nitrite: NEGATIVE
Protein, ur: NEGATIVE
Specific Gravity, Urine: 1.025 (ref 1.001–1.035)
WBC, UA: NONE SEEN WBC/HPF (ref <=5)
Yeast: NONE SEEN [HPF]
pH: 5.5 (ref 5.0–8.0)

## 2015-07-29 LAB — HEMOGLOBIN A1C
Hgb A1c MFr Bld: 5.8 % — ABNORMAL HIGH (ref <5.7)
Mean Plasma Glucose: 120 mg/dL — ABNORMAL HIGH (ref <117)

## 2015-07-30 ENCOUNTER — Other Ambulatory Visit: Payer: Self-pay | Admitting: *Deleted

## 2015-07-30 MED ORDER — SULFAMETHOXAZOLE-TRIMETHOPRIM 800-160 MG PO TABS: 1.0000 | ORAL_TABLET | Freq: Two times a day (BID) | ORAL | Status: DC

## 2015-07-31 LAB — URINE CULTURE: Colony Count: >=100000

## 2015-08-25 ENCOUNTER — Other Ambulatory Visit (INDEPENDENT_AMBULATORY_CARE_PROVIDER_SITE_OTHER): Payer: 59

## 2015-08-25 ENCOUNTER — Ambulatory Visit (INDEPENDENT_AMBULATORY_CARE_PROVIDER_SITE_OTHER): Payer: 59 | Admitting: Family

## 2015-08-25 ENCOUNTER — Encounter: Payer: Self-pay | Admitting: Family

## 2015-08-25 ENCOUNTER — Telehealth: Payer: Self-pay | Admitting: Internal Medicine

## 2015-08-25 VITALS — BP 112/78 | HR 67 | Temp 98.1°F | Resp 18 | Ht 64.0 in | Wt 264.0 lb

## 2015-08-25 DIAGNOSIS — R197 Diarrhea, unspecified: Secondary | ICD-10-CM | POA: Diagnosis not present

## 2015-08-25 DIAGNOSIS — R109 Unspecified abdominal pain: Secondary | ICD-10-CM

## 2015-08-25 LAB — URINALYSIS, ROUTINE W REFLEX MICROSCOPIC
Bilirubin Urine: NEGATIVE
Ketones, ur: NEGATIVE
Leukocytes, UA: NEGATIVE
Nitrite: NEGATIVE
Specific Gravity, Urine: >=1.03 — AB (ref 1.000–1.030)
Total Protein, Urine: NEGATIVE
Urine Glucose: NEGATIVE
Urobilinogen, UA: 0.2 (ref 0.0–1.0)
pH: 6 (ref 5.0–8.0)

## 2015-08-25 MED ORDER — ELUXADOLINE 100 MG PO TABS: 100.0000 mg | ORAL_TABLET | Freq: Two times a day (BID) | ORAL | Status: DC

## 2015-08-25 NOTE — Assessment & Plan Note (Signed)
Chronic diarrhea of undetermined origin unlikely infectious given the length of time of symptoms. Symptoms are consistent with IBS-D. Start Viberzi. Follow up in 1 month or sooner if symptoms do not improve.

## 2015-08-25 NOTE — Telephone Encounter (Signed)
Pt called stated that Eluxadoline (VIBERZI) 100 MG TABS need PA for insurance to cover. Pt was advise from pharmacy to see if we have any sample because it will be couple weeks before this can be done. Please call pt back

## 2015-08-25 NOTE — Progress Notes (Signed)
Subjective:    Patient ID: Julie Mcneil, female    DOB: 02/19/1981, 34 y.o.   MRN: [id number]  Chief Complaint  Patient presents with  . Diarrhea    has had issues with diarrhea on and off for a month, has times where she is constipated and then has issues with going to much, and also has a back pack that she describes as almost feeling numb    HPI:  Julie Mcneil is a 34 y.o. female who  has a past medical history of Vaginal yeast infection; UTI (urinary tract infection); GERD (gastroesophageal reflux disease); Obesity; Anxiety; and Cervical dysplasia. and presents today for an acute office visit.    This is a new problem. Associated symptom of diarrhea that waxes and wanes and the frequency has occurred more in the last month. Bowel movements generally occur between 2-10 times per day. Consistency of bowel movements ranges from firm to liquid. Modifying factors include immodium and probiotics which she states she has not been able to tell a difference. Denies any particular triggers although does have increased frequency during her menstrual cycle. Does have a sharp and crampy pain that prior to a bowel movement and is relieved after. Denies blood in stool.   2.) Flank numbness - Associated symptom of flank numbess has been going on for several days. Recently treated for UTI. Denies trauma or sounds/sensations that are heard or felt. Denies fevers, chills, or urinary symptoms.   Allergies  Allergen Reactions  . Sumatriptan Shortness Of Breath    Tight chest  . Nitrofurantoin Monohyd Macro Hives and Nausea And Vomiting     Current Outpatient Prescriptions on File Prior to Visit  Medication Sig Dispense Refill  . ibuprofen (ADVIL,MOTRIN) 200 MG tablet Take 3 tablets (600 mg total) by mouth every 6 (six) hours as needed for fever or mild pain. 30 tablet 1   No current facility-administered medications on file prior to visit.     Past Surgical History  Procedure Laterality  Date  . Cesarean section  [id number],2011    11-28-11  . Tubal ligation      2013 C-SECTION  . Colposcopy      Review of Systems  Constitutional: Negative for fever and chills.  Gastrointestinal: Positive for abdominal pain (occasional cramping) and diarrhea. Negative for nausea, vomiting, constipation, blood in stool and rectal pain.  Genitourinary: Positive for flank pain (Right flank numbness).      Objective:    BP 112/78 mmHg  Pulse 67  Temp(Src) 98.1 F (36.7 C) (Oral)  Resp 18  Ht 5\' 4"  (1.626 m)  Wt 264 lb (119.75 kg)  BMI 45.29 kg/m2  SpO2 99%  LMP 07/08/2015 Nursing note and vital signs reviewed.  Physical Exam  Constitutional: She is oriented to person, place, and time. She appears well-developed and well-nourished. No distress.  Cardiovascular: Normal rate, regular rhythm, normal heart sounds and intact distal pulses.   Pulmonary/Chest: Effort normal and breath sounds normal.  Abdominal: Soft. Normal appearance and bowel sounds are normal. She exhibits no mass. There is no hepatosplenomegaly. There is tenderness in the suprapubic area and left lower quadrant. There is no rigidity, no rebound, no guarding, no CVA tenderness, no tenderness at McBurney's point and negative Murphy's sign.  Neurological: She is alert and oriented to person, place, and time.  Skin: Skin is warm and dry.  Psychiatric: She has a normal mood and affect. Her behavior is normal. Judgment and thought content normal.  Assessment & Plan:   Problem List Items Addressed This Visit      Other   Flank pain    Flank numbness of undetermined origin and recent treatment for UTI. Obtain UA with micro. Does not appear to be orthopedic.Follow up pending UA results.       Relevant Orders   Urinalysis, Routine w reflex microscopic   Diarrhea - Primary    Chronic diarrhea of undetermined origin unlikely infectious given the length of time of symptoms. Symptoms are consistent with IBS-D. Start  Viberzi. Follow up in 1 month or sooner if symptoms do not improve.       Relevant Medications   Eluxadoline (VIBERZI) 100 MG TABS

## 2015-08-25 NOTE — Assessment & Plan Note (Signed)
Flank numbness of undetermined origin and recent treatment for UTI. Obtain UA with micro. Does not appear to be orthopedic.Follow up pending UA results.

## 2015-08-25 NOTE — Progress Notes (Signed)
Pre visit review using our clinic review tool, if applicable. No additional management support is needed unless otherwise documented below in the visit note.   Refused flu shot for today

## 2015-08-25 NOTE — Patient Instructions (Addendum)
Thank you for choosing [facility].  Summary/Instructions:  Your prescription(s) have been submitted to your pharmacy or been printed and provided for you. Please take as directed and contact our office if you believe you are having problem(s) with the medication(s) or have any questions.  Please stop by the lab on the basement level of the building for your blood work. Your results will be released to MyChart (or called to you) after review, usually within 72 hours after test completion. If any changes need to be made, you will be notified at that same time.  If your symptoms worsen or fail to improve, please contact our office for further instruction, or in case of emergency go directly to the emergency room at the closest medical facility.    Irritable Bowel Syndrome, Adult Irritable bowel syndrome (IBS) is not one specific disease. It is a group of symptoms that affects the organs responsible for digestion (gastrointestinal or GI tract).  To regulate how your GI tract works, your body sends signals back and forth between your intestines and your brain. If you have IBS, there may be a problem with these signals. As a result, your GI tract does not function normally. Your intestines may become more sensitive and overreact to certain things. This is especially true when you eat certain foods or when you are under stress.  There are four types of IBS. These may be determined based on the consistency of your stool:   IBS with diarrhea.   IBS with constipation.   Mixed IBS.   Unsubtyped IBS.  It is important to know which type of IBS you have. Some treatments are more likely to be helpful for certain types of IBS.  CAUSES  The exact cause of IBS is not known. RISK FACTORS You may have a higher risk of IBS if:  You are a woman.  You are younger than 34 years old.  You have a family history of IBS.  You have mental health problems.  You have had bacterial infection of your  GI tract. SIGNS AND SYMPTOMS  Symptoms of IBS vary from person to person. The main symptom is abdominal pain or discomfort. Additional symptoms usually include one or more of the following:   Diarrhea, constipation, or both.   Abdominal swelling or bloating.   Feeling full or sick after eating a small or regular-size meal.   Frequent gas.   Mucus in the stool.   A feeling of having more stool left after a bowel movement.  Symptoms tend to come and go. They may be associated with stress, psychiatric conditions, or nothing at all.  DIAGNOSIS  There is no specific test to diagnose IBS. Your health care provider will make a diagnosis based on a physical exam, medical history, and your symptoms. You may have other tests to rule out other conditions that may be causing your symptoms. These may include:   Blood tests.   X-rays.   CT scan.  Endoscopy and colonoscopy. This is a test in which your GI tract is viewed with a long, thin, flexible tube. TREATMENT There is no cure for IBS, but treatment can help relieve symptoms. IBS treatment often includes:   Changes to your diet, such as:  Eating more fiber.  Avoiding foods that cause symptoms.  Drinking more water.  Eating regular, medium-sized portioned meals.  Medicines. These may include:  Fiber supplements if you have constipation.  Medicine to control diarrhea (antidiarrheal medicines).  Medicine to help control muscle  spasms in your GI tract (antispasmodic medicines).  Medicines to help with any mental health issues, such as antidepressants or tranquilizers.  Therapy.  Talk therapy may help with anxiety, depression, or other mental health issues that can make IBS symptoms worse.  Stress reduction.  Managing your stress can help keep symptoms under control. HOME CARE INSTRUCTIONS   Take medicines only as directed by your health care provider.  Eat a healthy diet.  Avoid foods and drinks with added  sugar.  Include more whole grains, fruits, and vegetables gradually into your diet. This may be especially helpful if you have IBS with constipation.  Avoid any foods and drinks that make your symptoms worse. These may include dairy products and caffeinated or carbonated drinks.  Do not eat large meals.  Drink enough fluid to keep your urine clear or pale yellow.  Exercise regularly. Ask your health care provider for recommendations of good activities for you.  Keep all follow-up visits as directed by your health care provider. This is important. SEEK MEDICAL CARE IF:   You have constant pain.  You have trouble or pain with swallowing.  You have worsening diarrhea. SEEK IMMEDIATE MEDICAL CARE IF:   You have severe and worsening abdominal pain.   You have diarrhea and:   You have a rash, stiff neck, or severe headache.   You are irritable, sleepy, or difficult to awaken.   You are weak, dizzy, or extremely thirsty.   You have bright red blood in your stool or you have black tarry stools.   You have unusual abdominal swelling that is painful.   You vomit continuously.   You vomit blood (hematemesis).   You have both abdominal pain and a fever.    This information is not intended to replace advice given to you by your health care provider. Make sure you discuss any questions you have with your health care provider.   Document Released: 10/23/2005 Document Revised: 11/13/2014 Document Reviewed: 07/10/2014 Elsevier Interactive Patient Education [employer] Inc.

## 2015-08-26 NOTE — Telephone Encounter (Signed)
Received an incoming fax from [facility] stating pt needed PA for medication below but they also stated that the rx can be changed to lomotil and be covered. Can you change it to that medication? Please Julie Mcneil

## 2015-08-27 MED ORDER — DIPHENOXYLATE-ATROPINE 2.5-0.025 MG PO TABS: 1.0000 | ORAL_TABLET | Freq: Four times a day (QID) | ORAL | Status: DC | PRN

## 2015-08-27 NOTE — Telephone Encounter (Signed)
Would like to start her on this medication. Ok for lomitil until then.

## 2015-08-27 NOTE — Telephone Encounter (Signed)
Called pt to let her know to start lomotil until the PA was done. Pt aware.

## 2015-08-27 NOTE — Addendum Note (Signed)
Addended by: Jeanine LuzALONE, GREGORY D on: 08/27/2015 08:43 AM   Modules accepted: Orders

## 2015-09-20 ENCOUNTER — Other Ambulatory Visit (INDEPENDENT_AMBULATORY_CARE_PROVIDER_SITE_OTHER): Payer: 59

## 2015-09-20 ENCOUNTER — Encounter: Payer: Self-pay | Admitting: Family

## 2015-09-20 ENCOUNTER — Ambulatory Visit (INDEPENDENT_AMBULATORY_CARE_PROVIDER_SITE_OTHER): Payer: 59 | Admitting: Family

## 2015-09-20 VITALS — BP 112/84 | HR 87 | Temp 97.8°F | Resp 18 | Ht 64.0 in | Wt 264.0 lb

## 2015-09-20 DIAGNOSIS — R5383 Other fatigue: Secondary | ICD-10-CM | POA: Diagnosis not present

## 2015-09-20 LAB — CBC
HCT: 36.9 % (ref 36.0–46.0)
Hemoglobin: 12.1 g/dL (ref 12.0–15.0)
MCHC: 32.8 g/dL (ref 30.0–36.0)
MCV: 85 fl (ref 78.0–100.0)
Platelets: 355 10*3/uL (ref 150.0–400.0)
RBC: 4.34 Mil/uL (ref 3.87–5.11)
RDW: 13.5 % (ref 11.5–15.5)
WBC: 12.1 10*3/uL — ABNORMAL HIGH (ref 4.0–10.5)

## 2015-09-20 LAB — IBC PANEL
Iron: 33 ug/dL — ABNORMAL LOW (ref 42–145)
Saturation Ratios: 8.2 % — ABNORMAL LOW (ref 20.0–50.0)
Transferrin: 289 mg/dL (ref 212.0–360.0)

## 2015-09-20 LAB — HEMOGLOBIN A1C: Hgb A1c MFr Bld: 5.6 % (ref 4.6–6.5)

## 2015-09-20 NOTE — Patient Instructions (Signed)
Thank you for choosing [facility].  Summary/Instructions:  Please stop by the lab on the basement level of the building for your blood work. Your results will be released to MyChart (or called to you) after review, usually within 72 hours after test completion. If any changes need to be made, you will be notified at that same time.  If your symptoms worsen or fail to improve, please contact our office for further instruction, or in case of emergency go directly to the emergency room at the closest medical facility.   Fatigue Fatigue is feeling tired all of the time, a lack of energy, or a lack of motivation. Occasional or mild fatigue is often a normal response to activity or life in general. However, long-lasting (chronic) or extreme fatigue may indicate an underlying medical condition. HOME CARE INSTRUCTIONS  Watch your fatigue for any changes. The following actions may help to lessen any discomfort you are feeling:  Talk to your health care provider about how much sleep you need each night. Try to get the required amount every night.  Take medicines only as directed by your health care provider.  Eat a healthy and nutritious diet. Ask your health care provider if you need help changing your diet.  Drink enough fluid to keep your urine clear or pale yellow.  Practice ways of relaxing, such as yoga, meditation, massage therapy, or acupuncture.  Exercise regularly.   Change situations that cause you stress. Try to keep your work and personal routine reasonable.  Do not abuse illegal drugs.  Limit alcohol intake to no more than 1 drink per day for nonpregnant women and 2 drinks per day for men. One drink equals 12 ounces of beer, 5 ounces of wine, or 1 ounces of hard liquor.  Take a multivitamin, if directed by your health care provider. SEEK MEDICAL CARE IF:   Your fatigue does not get better.  You have a fever.   You have unintentional weight loss or gain.  You  have headaches.   You have difficulty:   Falling asleep.  Sleeping throughout the night.  You feel angry, guilty, anxious, or sad.   You are unable to have a bowel movement (constipation).   You skin is dry.   Your legs or another part of your body is swollen.  SEEK IMMEDIATE MEDICAL CARE IF:   You feel confused.   Your vision is blurry.  You feel faint or pass out.   You have a severe headache.   You have severe abdominal, pelvic, or back pain.   You have chest pain, shortness of breath, or an irregular or fast heartbeat.   You are unable to urinate or you urinate less than normal.   You develop abnormal bleeding, such as bleeding from the rectum, vagina, nose, lungs, or nipples.  You vomit blood.   You have thoughts about harming yourself or committing suicide.   You are worried that you might harm someone else.    This information is not intended to replace advice given to you by your health care provider. Make sure you discuss any questions you have with your health care provider.   Document Released: 08/20/2007 Document Revised: 11/13/2014 Document Reviewed: 02/24/2014 Elsevier Interactive Patient Education 2016 Elsevier Inc.   Dizziness Dizziness is a common problem. It is a feeling of unsteadiness or light-headedness. You may feel like you are about to faint. Dizziness can lead to injury if you stumble or fall. Anyone can become dizzy, but dizziness  is more common in older adults. This condition can be caused by a number of things, including medicines, dehydration, or illness. HOME CARE INSTRUCTIONS Taking these steps may help with your condition: Eating and Drinking  Drink enough fluid to keep your urine clear or pale yellow. This helps to keep you from becoming dehydrated. Try to drink more clear fluids, such as water.  Do not drink alcohol.  Limit your caffeine intake if directed by your health care provider.  Limit your salt  intake if directed by your health care provider. Activity  Avoid making quick movements.  Rise slowly from chairs and steady yourself until you feel okay.  In the morning, first sit up on the side of the bed. When you feel okay, stand slowly while you hold onto something until you know that your balance is fine.  Move your legs often if you need to stand in one place for a long time. Tighten and relax your muscles in your legs while you are standing.  Do not drive or operate heavy machinery if you feel dizzy.  Avoid bending down if you feel dizzy. Place items in your home so that they are easy for you to reach without leaning over. Lifestyle  Do not use any tobacco products, including cigarettes, chewing tobacco, or electronic cigarettes. If you need help quitting, ask your health care provider.  Try to reduce your stress level, such as with yoga or meditation. Talk with your health care provider if you need help. General Instructions  Watch your dizziness for any changes.  Take medicines only as directed by your health care provider. Talk with your health care provider if you think that your dizziness is caused by a medicine that you are taking.  Tell a friend or a family member that you are feeling dizzy. If he or she notices any changes in your behavior, have this person call your health care provider.  Keep all follow-up visits as directed by your health care provider. This is important. SEEK MEDICAL CARE IF:  Your dizziness does not go away.  Your dizziness or light-headedness gets worse.  You feel nauseous.  You have reduced hearing.  You have new symptoms.  You are unsteady on your feet or you feel like the room is spinning. SEEK IMMEDIATE MEDICAL CARE IF:  You vomit or have diarrhea and are unable to eat or drink anything.  You have problems talking, walking, swallowing, or using your arms, hands, or legs.  You feel generally weak.  You are not thinking  clearly or you have trouble forming sentences. It may take a friend or family member to notice this.  You have chest pain, abdominal pain, shortness of breath, or sweating.  Your vision changes.  You notice any bleeding.  You have a headache.  You have neck pain or a stiff neck.  You have a fever.   This information is not intended to replace advice given to you by your health care provider. Make sure you discuss any questions you have with your health care provider.   Document Released: 04/18/2001 Document Revised: 03/09/2015 Document Reviewed: 10/19/2014 Elsevier Interactive Patient Education [employer]

## 2015-09-20 NOTE — Assessment & Plan Note (Signed)
Fatigue most likely multifactorial. Obtain TSH, IBC panel, Ferritin, CBC, B12/Folate, Methylmalonic acid and A1c. Cannot rule out underlying anxiety, depression or unlikely cardiovascular disease. May possible be related to menstrual cycle. Epworth Sleepiness Scale of 13 so sleep issues cannot be fully ruled out. Discussed drinking plenty of fluids and adequate nutrition. Follow up pending blood work.

## 2015-09-20 NOTE — Progress Notes (Signed)
Subjective:    Patient ID: Julie Mcneil, female    DOB: 06/07/1981, [age] y.o.   MRN: [id number]  Chief Complaint  Patient presents with  . Dizziness    has had dizzy spells where she feels tired to where she could fall over, this usually happens the week after her period and she does have heavy cycles    HPI:  Julie Mcneil is a 34 y.o. female who  has a past medical history of Vaginal yeast infection; UTI (urinary tract infection); GERD (gastroesophageal reflux disease); Obesity; Anxiety; and Cervical dysplasia. and presents today for an acute office visit.   Associated symptom of dizzy spells and extreme fatigue has been going on for the last 2 weeks. Notes that it is the most significant right after her menstrual cycle. Describes the dizziness as a lightheadedness. Modifying factors include rest and eating something. Does have caffeine that helps periodically. Menstrual cycles typically last about 5-6 days and 4-5 of those days are generally heavy. States that she does drink about 7-8 cups of water. Does drink about 2 cups of caffeine daily. She does snore at night. Epworth Sleepiness Score of 13.    Allergies  Allergen Reactions  . Sumatriptan Shortness Of Breath    Tight chest  . Nitrofurantoin Monohyd Macro Hives and Nausea And Vomiting     Current Outpatient Prescriptions on File Prior to Visit  Medication Sig Dispense Refill  . diphenoxylate-atropine (LOMOTIL) 2.5-0.025 MG tablet Take 1 tablet by mouth 4 (four) times daily as needed for diarrhea or loose stools. 60 tablet 0  . Eluxadoline (VIBERZI) 100 MG TABS Take 100 mg by mouth 2 (two) times daily. 60 tablet 2  . ibuprofen (ADVIL,MOTRIN) 200 MG tablet Take 3 tablets (600 mg total) by mouth every 6 (six) hours as needed for fever or mild pain. 30 tablet 1   No current facility-administered medications on file prior to visit.    Past Medical History  Diagnosis Date  . Vaginal yeast infection     recurrent  . UTI  (urinary tract infection)     recurrent  . GERD (gastroesophageal reflux disease)   . Obesity   . Anxiety     Panic attacks  . Cervical dysplasia      Past Surgical History  Procedure Laterality Date  . Cesarean section  [id number],2011    11-28-11  . Tubal ligation      2013 C-SECTION  . Colposcopy     Review of Systems  Constitutional: Positive for fatigue. Negative for fever, chills and unexpected weight change.  Respiratory: Negative for chest tightness and shortness of breath.   Cardiovascular: Negative for chest pain, palpitations and leg swelling.  Neurological: Positive for dizziness and light-headedness. Negative for syncope and weakness.      Objective:    BP 112/84 mmHg  Pulse 87  Temp(Src) 97.8 F (36.6 C) (Oral)  Resp 18  Ht 5\' 4"  (1.626 m)  Wt 264 lb (119.75 kg)  BMI 45.29 kg/m2  SpO2 99% Nursing note and vital signs reviewed.  Physical Exam  Constitutional: She is oriented to person, place, and time. She appears well-developed and well-nourished. No distress.  HENT:  Right Ear: Hearing, tympanic membrane, external ear and ear canal normal.  Left Ear: Hearing, tympanic membrane, external ear and ear canal normal.  Nose: Nose normal. Right sinus exhibits no maxillary sinus tenderness and no frontal sinus tenderness. Left sinus exhibits no maxillary sinus tenderness and no frontal sinus tenderness.  Mouth/Throat: Uvula is midline, oropharynx is clear and moist and mucous membranes are normal.  Cardiovascular: Normal rate, regular rhythm, normal heart sounds and intact distal pulses.   Pulmonary/Chest: Effort normal and breath sounds normal.  Neurological: She is alert and oriented to person, place, and time.  Skin: Skin is warm and dry. There is pallor.  Psychiatric: She has a normal mood and affect. Her behavior is normal. Judgment and thought content normal.       Assessment & Plan:   Problem List Items Addressed This Visit      Other   Fatigue -  Primary    Fatigue most likely multifactorial. Obtain TSH, IBC panel, Ferritin, CBC, B12/Folate, Methylmalonic acid and A1c. Cannot rule out underlying anxiety, depression or unlikely cardiovascular disease. May possible be related to menstrual cycle. Epworth Sleepiness Scale of 13 so sleep issues cannot be fully ruled out. Discussed drinking plenty of fluids and adequate nutrition. Follow up pending blood work.      Relevant Orders   B12 and Folate Panel   CBC   IBC panel   Hemoglobin A1c   Ferritin   TSH   Methylmalonic Acid

## 2015-09-20 NOTE — Progress Notes (Signed)
Pre visit review using our clinic review tool, if applicable. No additional management support is needed unless otherwise documented below in the visit note. 

## 2015-09-21 ENCOUNTER — Ambulatory Visit: Payer: 59 | Admitting: Women's Health

## 2015-09-21 ENCOUNTER — Encounter: Payer: Self-pay | Admitting: Family

## 2015-09-21 LAB — B12 AND FOLATE PANEL
Folate: 7.5 ng/mL (ref >5.9)
Vitamin B-12: 394 pg/mL (ref 211–911)

## 2015-09-21 LAB — TSH: TSH: 2.41 u[IU]/mL (ref 0.35–4.50)

## 2015-09-21 LAB — FERRITIN: Ferritin: 24.3 ng/mL (ref 10.0–291.0)

## 2015-09-22 NOTE — Telephone Encounter (Addendum)
-----   Message from Hulen Lusteronna B Mobley sent at 09/21/2015  9:37 AM EST ----- Regarding: canceled todays appt Contact: [phone] Harriett Sineancy pt canceled uti appt today went to PCP  [facility]. She has questions about her cycle.  Not sure if you want to continue this based on her sx's now.    Would like to talk to you when you have time at the above #.  Thanks :)  Telephone call to [place], having regular monthly cycle/BTL. Reviewed does not need a sonohysterogram at this time cycles have regulated. Feeling more fatigued reports low iron at primary care. Will start a multivitamin with iron in it daily, increase iron rich foods and diet and try to rest more.

## 2015-09-23 ENCOUNTER — Encounter: Payer: Self-pay | Admitting: Family

## 2015-09-23 LAB — METHYLMALONIC ACID, SERUM: Methylmalonic Acid, Quant: 95 nmol/L (ref 87–318)

## 2015-10-22 ENCOUNTER — Telehealth: Payer: Self-pay | Admitting: *Deleted

## 2015-10-22 MED ORDER — AMOXICILLIN 500 MG PO TABS: ORAL_TABLET | ORAL | Status: DC

## 2015-10-22 NOTE — Telephone Encounter (Signed)
Pt said that she would like Rx, Z-pak causes heart palpations, asked if you could give Rx for amoxicillin?

## 2015-10-22 NOTE — Telephone Encounter (Signed)
Pt called c/o sinus infection, greenish mucous, cough, headache, cough, drainage. Pt called PCP and has yet to here back, pt is leaving out of town tomorrow for the holiday. Please advise

## 2015-10-22 NOTE — Telephone Encounter (Signed)
OK, office visit would be better, could treat  symptoms with Robitussin DM, Tylenol, she does have a history of sinus infection if she thinks she really needs a Z-Pak okay to call in, office visit if no relief.

## 2015-10-22 NOTE — Telephone Encounter (Signed)
Pt aware Rx sent.  

## 2015-10-22 NOTE — Telephone Encounter (Signed)
Okay, amoxicillin 500 mg 3 times a day for 5 days.

## 2015-11-15 ENCOUNTER — Ambulatory Visit: Payer: Self-pay | Admitting: Internal Medicine

## 2015-11-22 ENCOUNTER — Encounter: Payer: Self-pay | Admitting: Internal Medicine

## 2015-11-22 ENCOUNTER — Ambulatory Visit (INDEPENDENT_AMBULATORY_CARE_PROVIDER_SITE_OTHER): Payer: BLUE CROSS/BLUE SHIELD | Admitting: Internal Medicine

## 2015-11-22 VITALS — BP 132/82 | HR 78 | Temp 98.2°F | Resp 20 | Ht 64.0 in | Wt 264.0 lb

## 2015-11-22 DIAGNOSIS — J019 Acute sinusitis, unspecified: Secondary | ICD-10-CM

## 2015-11-22 DIAGNOSIS — F419 Anxiety disorder, unspecified: Secondary | ICD-10-CM | POA: Insufficient documentation

## 2015-11-22 DIAGNOSIS — F411 Generalized anxiety disorder: Secondary | ICD-10-CM

## 2015-11-22 MED ORDER — LEVOFLOXACIN 500 MG PO TABS: 500.0000 mg | ORAL_TABLET | Freq: Every day | ORAL | Status: DC

## 2015-11-22 NOTE — Progress Notes (Signed)
   Subjective:    Patient ID: Julie Mcneil, female    DOB: 07/10/1981, 35 y.o.   MRN: [id number]  HPI   Here with 2-3 wks acute onset fever, facial pain, pressure, headache, general weakness and malaise, and greenish d/c, with mild ST and cough, but pt denies chest pain, wheezing, increased sob or doe, orthopnea, PND, increased LE swelling, palpitations, dizziness or syncope.  Felt some improved with 3 out of 5 days prescribed amoxil last wk, but now recurred, also making her anxiety worse.Denies worsening depressive symptoms, suicidal ideation, or panic; has ongoing anxiety, Past Medical History  Diagnosis Date  . Vaginal yeast infection     recurrent  . UTI (urinary tract infection)     recurrent  . GERD (gastroesophageal reflux disease)   . Obesity   . Anxiety     Panic attacks  . Cervical dysplasia    Past Surgical History  Procedure Laterality Date  . Cesarean section  [id number],2011    11-28-11  . Tubal ligation      2013 C-SECTION  . Colposcopy      reports that she has never smoked. She has never used smokeless tobacco. She reports that she does not drink alcohol or use illicit drugs. family history includes Cancer in her maternal grandmother; Diabetes in her maternal grandmother; Early death in her mother; Heart disease in her maternal grandmother; Hypertension in her paternal grandfather and paternal grandmother; Stroke in her paternal grandmother. There is no history of Anesthesia problems, Hypotension, Malignant hyperthermia, or Pseudochol deficiency. Allergies  Allergen Reactions  . Sumatriptan Shortness Of Breath    Tight chest  . Nitrofurantoin Monohyd Macro Hives and Nausea And Vomiting   Current Outpatient Prescriptions on File Prior to Visit  Medication Sig Dispense Refill  . ibuprofen (ADVIL,MOTRIN) 200 MG tablet Take 3 tablets (600 mg total) by mouth every 6 (six) hours as needed for fever or mild pain. 30 tablet 1   No current facility-administered medications  on file prior to visit.    Review of Systems All otherwise neg per pt     Objective:   Physical Exam BP 132/82 mmHg  Pulse 78  Temp(Src) 98.2 F (36.8 C) (Oral)  Resp 20  Ht 5\' 4"  (1.626 m)  Wt 264 lb (119.75 kg)  BMI 45.29 kg/m2  SpO2 97% VS noted, mild ill Constitutional: Pt appears in no significant distress HENT: Head: NCAT.  Right Ear: External ear normal.  Left Ear: External ear normal.  Eyes: . Pupils are equal, round, and reactive to light. Conjunctivae and EOM are normal Bilat tm's with mild erythema.  Julie sinus areas mild tender.  Pharynx with mild erythema, no exudate Neck: Normal range of motion. Neck supple.  Cardiovascular: Normal rate and regular rhythm.   Pulmonary/Chest: Effort normal and breath sounds without rales or wheezing.  Neurological: Pt is alert. Not confused , motor grossly intact Skin: Skin is warm. No rash, no LE edema Psychiatric: Pt behavior is normal. No agitation. 1-2+ nervous    Assessment & Plan:

## 2015-11-22 NOTE — Patient Instructions (Addendum)
Please take all new medication as prescribed - he antibiotic  You can also take Delsym OTC for cough, and/or Mucinex (or it's generic off brand) for congestion, and tylenol as needed for pain.  Please continue all other medications as before, and refills have been done if requested.  Please have the pharmacy call with any other refills you may need.  Please keep your appointments with your specialists as you may have planned

## 2015-11-22 NOTE — Assessment & Plan Note (Signed)
Quite prominent in her presentation, suspect possible underlying disorder,  to f/u any worsening symptoms or concerns

## 2015-11-22 NOTE — Progress Notes (Signed)
Pre visit review using our clinic review tool, if applicable. No additional management support is needed unless otherwise documented below in the visit note. 

## 2015-11-22 NOTE — Assessment & Plan Note (Signed)
Mild to mod, for antibx course,  to f/u any worsening symptoms or concerns 

## 2015-11-25 ENCOUNTER — Telehealth: Payer: Self-pay | Admitting: Internal Medicine

## 2015-11-25 NOTE — Telephone Encounter (Signed)
[facility] - Client TELEPHONE ADVICE RECORD [facility]  Patient Name: Julie Mcneil  DOB: 1981/04/07    Initial Comment Caller states she was rx levaquin for sinus infection, now achy all over, especially right arm, also nauseated.   Nurse Assessment  Nurse: Izola Price, RN, Cala Bradford Date/Time Lamount Cohen Time): 11/25/2015 3:33:39 PM  Confirm and document reason for call. If symptomatic, describe symptoms. You must click the next button to save text entered. ---Caller states she was rx levaquin for sinus infection, now achy all over, especially right arm, also nauseated. Was in office on Monday. Can hardly move arm above the chest (right). No fever  Has the patient traveled out of the country within the last 30 days? ---No  Does the patient have any new or worsening symptoms? ---Yes  Will a triage be completed? ---Yes  Related visit to physician within the last 2 weeks? ---Yes  Does the PT have any chronic conditions? (i.e. diabetes, asthma, etc.) ---No  Did the patient indicate they were pregnant? ---No  Is this a behavioral health or substance abuse call? ---No     Guidelines    Guideline Title Affirmed Question Affirmed Notes  Shoulder Pain [1] SEVERE pain AND [2] not improved 2 hours after pain medicine    Final Disposition User   Go to ED Now (or PCP triage) Izola Price, RN, Cala Bradford    Comments  Pt wanted nurse to see if anyone in the office could see her so nurse called backline and spoke with [place] who stated that no one had any openings since they stop seeing pt at 4 but Pt Dr. had absolutely no openings. Nurse advised pt to go to Er which she agreed to do .   Referrals  [facility] - ED   Disagree/Comply: Comply

## 2015-11-30 ENCOUNTER — Telehealth: Payer: Self-pay | Admitting: Internal Medicine

## 2015-11-30 NOTE — Telephone Encounter (Signed)
Patient called to advise that she spoke to [facility] last week regarding a reaction to levofloxacin (LEVAQUIN) 500 MG tablet [[id number]]. States that she was asking for something else to be called in because the medication was causing body aches. They advised her to go to the ED. She states that she cannot afford to go there for something like this. Please notify patient if it is possible to call in another RX/. She states that she is still feeling bad because she has not been able to take the medication.

## 2015-12-02 MED ORDER — AZITHROMYCIN 250 MG PO TABS: ORAL_TABLET | ORAL | Status: DC

## 2015-12-02 NOTE — Telephone Encounter (Signed)
Ok for zpack - done erx 

## 2015-12-02 NOTE — Telephone Encounter (Signed)
This pt called in again.  Can you look at this today?

## 2015-12-02 NOTE — Telephone Encounter (Signed)
Please advise on alternative ABX, thanks

## 2015-12-02 NOTE — Telephone Encounter (Signed)
Pt advised via VM 

## 2016-01-17 ENCOUNTER — Ambulatory Visit (INDEPENDENT_AMBULATORY_CARE_PROVIDER_SITE_OTHER): Payer: BLUE CROSS/BLUE SHIELD | Admitting: Adult Health

## 2016-01-17 ENCOUNTER — Encounter: Payer: Self-pay | Admitting: Adult Health

## 2016-01-17 VITALS — BP 104/76 | HR 83 | Temp 98.5°F | Ht 64.0 in | Wt 264.3 lb

## 2016-01-17 DIAGNOSIS — R8290 Unspecified abnormal findings in urine: Secondary | ICD-10-CM | POA: Diagnosis not present

## 2016-01-17 DIAGNOSIS — R42 Dizziness and giddiness: Secondary | ICD-10-CM | POA: Diagnosis not present

## 2016-01-17 DIAGNOSIS — M549 Dorsalgia, unspecified: Secondary | ICD-10-CM | POA: Diagnosis not present

## 2016-01-17 LAB — POCT URINALYSIS DIPSTICK
Bilirubin, UA: NEGATIVE
Glucose, UA: NEGATIVE
Ketones, UA: NEGATIVE
Leukocytes, UA: NEGATIVE
Protein, UA: NEGATIVE
Spec Grav, UA: 1.02
Urobilinogen, UA: 0.2
pH, UA: 5.5

## 2016-01-17 MED ORDER — CIPROFLOXACIN HCL 500 MG PO TABS: 500.0000 mg | ORAL_TABLET | Freq: Two times a day (BID) | ORAL | Status: DC

## 2016-01-17 NOTE — Progress Notes (Signed)
Subjective:    Patient ID: Julie Mcneil, female    DOB: 30-Nov-1980, 35 y.o.   MRN: [id number]  HPI  35 year old female who presents to the office today with multiple complaints   1) I have had dizziness since November. The dizziness is intermittent, the room does not feel like it is spinning " I feel like I am going to pass out."  It happens more when she turns her head a certain direction. This has been an ongoing issue and has been worked up for in the past by her old PCP. Has Meclizine at home but has not been using it. Endorses staying well hydrated. No syncopal episodes.   2) Numbness in her back " under my right rib" This complaint is intermittent. Has not noticed that anything helps. She does not have any pain, " it is just annoying". This has been an issue since January)   3) She also feels like she has  UTI for the last few days. Her only symptom is that of odorous urine. She feels as though this is the case whenever she has them.   Review of Systems  Constitutional: Negative.   HENT: Negative.   Eyes: Negative.   Respiratory: Negative.   Cardiovascular: Negative.   Gastrointestinal: Negative.   Genitourinary:       Odor  Musculoskeletal: Positive for myalgias. Negative for neck pain and neck stiffness.  Skin: Negative.   Neurological: Positive for dizziness. Negative for tremors, syncope, speech difficulty, weakness and light-headedness.  All other systems reviewed and are negative.  Past Medical History  Diagnosis Date  . Vaginal yeast infection     recurrent  . UTI (urinary tract infection)     recurrent  . GERD (gastroesophageal reflux disease)   . Obesity   . Anxiety     Panic attacks  . Cervical dysplasia     Social History   Social History  . Marital Status: Married    Spouse Name: N/A  . Number of Children: 3  . Years of Education: 15   Occupational History  . Engineer, manufacturing Education - [employer]   Social History Main Topics    . Smoking status: Never Smoker   . Smokeless tobacco: Never Used  . Alcohol Use: No  . Drug Use: No  . Sexual Activity: Yes    Birth Control/ Protection: Surgical     Comment: TUBAL LIGATION   Other Topics Concern  . Not on file   Social History Narrative   Fun: Going to [employer]    Past Surgical History  Procedure Laterality Date  . Cesarean section  [id number]    11-28-11  . Tubal ligation      2013 C-SECTION  . Colposcopy      Family History  Problem Relation Age of Onset  . Anesthesia problems Neg Hx   . Hypotension Neg Hx   . Malignant hyperthermia Neg Hx   . Pseudochol deficiency Neg Hx   . Early death Mother   . Heart disease Maternal Grandmother   . Diabetes Maternal Grandmother   . Cancer Maternal Grandmother     Lymphoma  . Hypertension Paternal Grandmother   . Hypertension Paternal Grandfather   . Stroke Paternal Grandmother     Allergies  Allergen Reactions  . Sumatriptan Shortness Of Breath    Tight chest  . Levaquin [Levofloxacin In D5w] Nausea Only  . Nitrofurantoin Monohyd Macro Hives and Nausea And Vomiting  Current Outpatient Prescriptions on File Prior to Visit  Medication Sig Dispense Refill  . ibuprofen (ADVIL,MOTRIN) 200 MG tablet Take 3 tablets (600 mg total) by mouth every 6 (six) hours as needed for fever or mild pain. 30 tablet 1   No current facility-administered medications on file prior to visit.    BP 104/76 mmHg  Pulse 83  Temp(Src) 98.5 F (36.9 C) (Oral)  Ht  (1.626 m)  Wt 264 lb 4.8 oz (119.886 kg)  BMI 45.34 kg/m2  SpO2 98%  LMP 01/05/2016  Past Medical History  Diagnosis Date  . Vaginal yeast infection     recurrent  . UTI (urinary tract infection)     recurrent  . GERD (gastroesophageal reflux disease)   . Obesity   . Anxiety     Panic attacks  . Cervical dysplasia     Social History   Social History  . Marital Status: Married    Spouse Name: N/A  . Number of Children: 3  . Years of  Education: 15   Occupational History  . 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 Smoking status: Never Smoker   . Smokeless tobacco: Never Used  . Alcohol Use: No  . Drug Use: No  . Sexual Activity: Yes    Birth Control/ Protection: Surgical     Comment: TUBAL LIGATION   Other Topics Concern  . Not on file   Social History Narrative   Fun: Going to [employer]    Past Surgical History  Procedure Laterality Date  . Cesarean section  [id number],[id number]    11-28-11  . Tubal ligation      2013 C-SECTION  . Colposcopy      Family History  Problem Relation Age of Onset  . Anesthesia problems Neg Hx   . Hypotension Neg Hx   . Malignant hyperthermia Neg Hx   . Pseudochol deficiency Neg Hx   . Early death Mother   . Heart disease Maternal Grandmother   . Diabetes Maternal Grandmother   . Cancer Maternal Grandmother     Lymphoma  . Hypertension Paternal Grandmother   . Hypertension Paternal Grandfather   . Stroke Paternal Grandmother     Allergies  Allergen Reactions  . Sumatriptan Shortness Of Breath    Tight chest  . Levaquin [Levofloxacin In D5w] Nausea Only  . Nitrofurantoin Monohyd Macro Hives and Nausea And Vomiting    Current Outpatient Prescriptions on File Prior to Visit  Medication Sig Dispense Refill  . ibuprofen (ADVIL,MOTRIN) 200 MG tablet Take 3 tablets (600 mg total) by mouth every 6 (six) hours as needed for fever or mild pain. 30 tablet 1   No current facility-administered medications on file prior to visit.    BP 104/76 mmHg  Pulse 83  Temp(Src) 98.5 F (36.9 C) (Oral)  Ht  (1.626 m)  Wt 264 lb 4.8 oz (119.886 kg)  BMI 45.34 kg/m2  SpO2 98%  LMP 01/05/2016        Objective:   Physical Exam  Constitutional: She is oriented to person, place, and time. She appears well-developed and well-nourished. No distress.  HENT:  Head: Normocephalic and atraumatic.  Right Ear: External ear normal.  Left Ear:  External ear normal.  Nose: Nose normal.  Mouth/Throat: Oropharynx is clear and moist. No oropharyngeal exudate.  Eyes: Conjunctivae and EOM are normal. Pupils are equal, round, and reactive to light. Right eye exhibits no discharge.  Left eye exhibits no discharge. No scleral icterus.  No increased dizziness with sitting up quickly from laying down position. No increased dizziness from moving head from side to side.  No horizontal or vertical nystagmus  Neck: Normal range of motion. Neck supple. No tracheal deviation present. No thyromegaly present.  Cardiovascular: Normal rate, regular rhythm, normal heart sounds and intact distal pulses.  Exam reveals no gallop and no friction rub.   No murmur heard. Pulmonary/Chest: Effort normal and breath sounds normal. No respiratory distress. She has no wheezes. She has no rales. She exhibits no tenderness.  Musculoskeletal: Normal range of motion. She exhibits no edema or tenderness.  Lymphadenopathy:    She has no cervical adenopathy.  Neurological: She is alert and oriented to person, place, and time.  Skin: Skin is warm and dry. No rash noted. She is not diaphoretic. No erythema. No pallor.  Psychiatric: She has a normal mood and affect. Her behavior is normal. Judgment and thought content normal.  Nursing note and vitals reviewed.     Assessment & Plan:  1. Abnormal urine odor - POCT urinalysis dipstick; Standing - Culture, Urine - POCT urinalysis dipstick - ciprofloxacin (CIPRO) 500 MG tablet; Take 1 tablet (500 mg total) by mouth 2 (two) times daily.  Dispense: 6 tablet; Refill: 0 - Iron, TIBC and Ferritin Panel - CBC with Differential/Platelet - TSH  2. Dizziness - Unknown cause, maybe Vertigo  - Iron, TIBC and Ferritin Panel - CBC with Differential/Platelet - TSH - Take prescribed Meclizine 3. Discomfort of back - Unknown cause - She was vague with her symptoms - Advised stretching exercises.    * She needs to find a new PCP  instead of just seeing someone when they are available.

## 2016-01-17 NOTE — Progress Notes (Signed)
Pre visit review using our clinic review tool, if applicable. No additional management support is needed unless otherwise documented below in the visit note. 

## 2016-01-17 NOTE — Patient Instructions (Signed)
It was great meeting you today!  I have sent in a prescription for Cipro, take this twice a day ( with food) for 3 days.   Stretching exercises for the back and make sure you are drinking plenty of water.   Use the prescribed Meclizine you have at home for the dizziness.

## 2016-01-20 LAB — URINE CULTURE: Colony Count: >=100000

## 2016-02-01 ENCOUNTER — Telehealth: Payer: Self-pay | Admitting: Internal Medicine

## 2016-02-01 NOTE — Telephone Encounter (Signed)
Ok with me 

## 2016-02-01 NOTE — Telephone Encounter (Signed)
Pt's husband Marcial Pacasimothy is a pt of yours and she was hoping to transfer from [place] to you. She is coming in tomorrow for an acute visit with you. Please advise

## 2016-02-02 ENCOUNTER — Other Ambulatory Visit: Payer: BLUE CROSS/BLUE SHIELD

## 2016-02-02 ENCOUNTER — Ambulatory Visit (INDEPENDENT_AMBULATORY_CARE_PROVIDER_SITE_OTHER): Payer: BLUE CROSS/BLUE SHIELD | Admitting: Internal Medicine

## 2016-02-02 ENCOUNTER — Encounter: Payer: Self-pay | Admitting: Internal Medicine

## 2016-02-02 VITALS — BP 136/80 | HR 92 | Temp 98.5°F | Resp 20 | Wt 268.0 lb

## 2016-02-02 DIAGNOSIS — J069 Acute upper respiratory infection, unspecified: Secondary | ICD-10-CM | POA: Diagnosis not present

## 2016-02-02 DIAGNOSIS — R300 Dysuria: Secondary | ICD-10-CM | POA: Diagnosis not present

## 2016-02-02 DIAGNOSIS — F411 Generalized anxiety disorder: Secondary | ICD-10-CM | POA: Diagnosis not present

## 2016-02-02 LAB — POCT URINALYSIS DIPSTICK
Bilirubin, UA: NEGATIVE
Glucose, UA: NEGATIVE
Ketones, UA: NEGATIVE
Leukocytes, UA: NEGATIVE
Nitrite, UA: NEGATIVE
Protein, UA: NEGATIVE
Spec Grav, UA: 1.02
Urobilinogen, UA: NEGATIVE
pH, UA: 5.5

## 2016-02-02 MED ORDER — ESCITALOPRAM OXALATE 10 MG PO TABS: 10.0000 mg | ORAL_TABLET | Freq: Every day | ORAL | Status: DC

## 2016-02-02 MED ORDER — CEPHALEXIN 500 MG PO CAPS: 500.0000 mg | ORAL_CAPSULE | Freq: Four times a day (QID) | ORAL | Status: DC

## 2016-02-02 NOTE — Assessment & Plan Note (Signed)
udip neg, for urine cx,  to f/u any worsening symptoms or concerns

## 2016-02-02 NOTE — Progress Notes (Signed)
Pre visit review using our clinic review tool, if applicable. No additional management support is needed unless otherwise documented below in the visit note. 

## 2016-02-02 NOTE — Assessment & Plan Note (Signed)
Mild to mod, for lexapro 10 qd, ,  to f/u any worsening symptoms or concerns

## 2016-02-02 NOTE — Progress Notes (Signed)
Subjective:    Patient ID: Julie Mcneil, female    DOB: 09/15/1981, 35 y.o.   MRN: [id number]  HPI   Here with 2-3 days acute onset fever, facial pain, pressure, headache, general weakness and malaise, and greenish d/c, with mild ST and cough, but pt denies chest pain, wheezing, increased sob or doe, orthopnea, PND, increased LE swelling, palpitations, dizziness or syncope.  Also with several days mild dysuria but Denies urinary symptoms such as frequency, urgency, flank pain, hematuria or n/v.  Denies worsening depressive symptoms, suicidal ideation, or panic; has ongoing anxiety, overall increased recently. .Pt denies new neurological symptoms such as new headache, or facial or extremity weakness or numbness Past Medical History  Diagnosis Date  . Vaginal yeast infection     recurrent  . UTI (urinary tract infection)     recurrent  . GERD (gastroesophageal reflux disease)   . Obesity   . Anxiety     Panic attacks  . Cervical dysplasia    Past Surgical History  Procedure Laterality Date  . Cesarean section  [id number]    11-28-11  . Tubal ligation      2013 C-SECTION  . Colposcopy      reports that she has never smoked. She has never used smokeless tobacco. She reports that she does not drink alcohol or use illicit drugs. family history includes Cancer in her maternal grandmother; Diabetes in her maternal grandmother; Early death in her mother; Heart disease in her maternal grandmother; Hypertension in her paternal grandfather and paternal grandmother; Stroke in her paternal grandmother. There is no history of Anesthesia problems, Hypotension, Malignant hyperthermia, or Pseudochol deficiency. Allergies  Allergen Reactions  . Sumatriptan Shortness Of Breath    Tight chest  . Levaquin [Levofloxacin In D5w] Nausea Only  . Nitrofurantoin Monohyd Macro Hives and Nausea And Vomiting   Current Outpatient Prescriptions on File Prior to Visit  Medication Sig Dispense Refill  .  ibuprofen (ADVIL,MOTRIN) 200 MG tablet Take 3 tablets (600 mg total) by mouth every 6 (six) hours as needed for fever or mild pain. 30 tablet 1   No current facility-administered medications on file prior to visit.      Review of Systems  Constitutional: Negative for unusual diaphoresis or night sweats HENT: Negative for ear swelling or discharge Eyes: Negative for worsening visual haziness  Respiratory: Negative for choking and stridor.   Gastrointestinal: Negative for distension or worsening eructation Genitourinary: Negative for retention or change in urine volume.  Musculoskeletal: Negative for other MSK pain or swelling Skin: Negative for color change and worsening wound Neurological: Negative for tremors and numbness other than noted  Psychiatric/Behavioral: Negative for decreased concentration or agitation other than above       Objective:   Physical Exam BP 136/80 mmHg  Pulse 92  Temp(Src) 98.5 F (36.9 C) (Oral)  Resp 20  Wt 268 lb (121.564 kg)  SpO2 97%  LMP 01/05/2016 VS noted,  Constitutional: Pt appears in no apparent distress HENT: Head: NCAT.  Right Ear: External ear normal.  Left Ear: External ear normal.  Eyes: . Pupils are equal, round, and reactive to light. Conjunctivae and EOM are normal Neck: Normal range of motion. Neck supple.  Cardiovascular: Normal rate and regular rhythm.   Pulmonary/Chest: Effort normal and breath sounds without rales or wheezing.  Abd:  Soft, NT, ND, + BS, no flank tender Neurological: Pt is alert. Not confused , motor grossly intact Skin: Skin is warm. No rash, no LE edema  Psychiatric: Pt behavior is normal. No agitation. 1-2+nervous  POCT Urinalysis Dipstick  Status: Finalresult Visible to patient:  Not Released Dx:  Dysuria   Normal           Ref Range 4:28 PM  2wk ago  43mo ago  87mo ago     Color, UA  yellow yellow      Clarity, UA  cloudy clear      Glucose, UA  negative n  NEGATIVE     Bilirubin, UA  negative n      Ketones, UA  negative n      Spec Grav, UA  1.020 1.020      Blood, UA  2+ 2+      pH, UA  5.5 5.5      Protein, UA  negative n      Urobilinogen, UA  negative 0.2 0.2     Nitrite, UA  negative po      Leukocytes, UA Negative  Negative               Assessment & Plan:

## 2016-02-02 NOTE — Patient Instructions (Signed)
Please take all new medication as prescribed -  The Antibiotic, and the lexapro  Your specimen will be sent for culture, and you will be called if the antibiotic needs to be changed  Please continue all other medications as before, and refills have been done if requested.  Please have the pharmacy call with any other refills you may need.  Please keep your appointments with your specialists as you may have planned

## 2016-02-02 NOTE — Telephone Encounter (Signed)
Pt informed

## 2016-02-02 NOTE — Assessment & Plan Note (Signed)
Mild to mod, for antibx course,  to f/u any worsening symptoms or concerns 

## 2016-02-04 LAB — URINE CULTURE
Colony Count: NO GROWTH
Organism ID, Bacteria: NO GROWTH

## 2016-03-02 ENCOUNTER — Other Ambulatory Visit: Payer: Self-pay | Admitting: Women's Health

## 2016-03-03 ENCOUNTER — Telehealth: Payer: Self-pay | Admitting: *Deleted

## 2016-03-03 MED ORDER — NYSTATIN-TRIAMCINOLONE 100000-0.1 UNIT/GM-% EX CREA: 1.0000 "application " | TOPICAL_CREAM | Freq: Two times a day (BID) | CUTANEOUS | Status: DC

## 2016-03-03 NOTE — Telephone Encounter (Signed)
Ok for cm

## 2016-03-03 NOTE — Telephone Encounter (Signed)
Rx sent 

## 2016-03-03 NOTE — Telephone Encounter (Signed)
Chris from [facility] called stating they only have mycology cream,they do not have the ointment. Okay to pt to have? Pt is fine with cream. Please advise

## 2016-05-02 ENCOUNTER — Telehealth: Payer: Self-pay | Admitting: *Deleted

## 2016-05-02 NOTE — Telephone Encounter (Signed)
Pt called c/o sinus infection, PCP unable to see her until next week, pt said she can't wait until next week. Works night unable to get to urgent care during the day due to sleep. Pt asked if you are willing to prescribe amoxacillin, states Z-pack makes her sick. Please advise

## 2016-05-02 NOTE — Telephone Encounter (Signed)
Pt said she can't come today she had 5 kids with her now and is having facial pressure, yellowish drainage, tried OTC mucinex and no relief.

## 2016-05-02 NOTE — Telephone Encounter (Signed)
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 Temperature has not been greater than 100, cough is productive in the morning for yellow clear as day progresses. Children were sick 2 weeks ago. States she has been unable to shake this for 2 weeks. Reviewed importance of increasing fluids, rest, continue Mucinex. Reviewed most likely viral.

## 2016-05-02 NOTE — Telephone Encounter (Signed)
Office visit best we could see her later in the day, could try over-the-counter products such as Claritin, Benadryl, increase fluids, Motrin for sinus pressure. All sinus infections do not need antibiotics, some could be viral.

## 2016-07-07 ENCOUNTER — Ambulatory Visit: Payer: BLUE CROSS/BLUE SHIELD | Admitting: Internal Medicine

## 2016-07-07 ENCOUNTER — Telehealth: Payer: Self-pay | Admitting: Emergency Medicine

## 2016-07-07 NOTE — Telephone Encounter (Signed)
I have seen her in the past. Ok with me

## 2016-07-07 NOTE — Telephone Encounter (Signed)
Pt missed her transfer from Julie Mcneil appt today 9/1. Would you like me to call and reschedule that appt?

## 2016-07-07 NOTE — Telephone Encounter (Signed)
Called pt back to reschedule appt. No answer and no VM. Thanks.

## 2016-11-20 ENCOUNTER — Ambulatory Visit: Payer: Self-pay | Admitting: Sports Medicine

## 2016-11-21 ENCOUNTER — Ambulatory Visit (INDEPENDENT_AMBULATORY_CARE_PROVIDER_SITE_OTHER): Payer: BLUE CROSS/BLUE SHIELD | Admitting: Nurse Practitioner

## 2016-11-21 ENCOUNTER — Other Ambulatory Visit (INDEPENDENT_AMBULATORY_CARE_PROVIDER_SITE_OTHER): Payer: BLUE CROSS/BLUE SHIELD

## 2016-11-21 ENCOUNTER — Encounter: Payer: Self-pay | Admitting: Nurse Practitioner

## 2016-11-21 ENCOUNTER — Ambulatory Visit (INDEPENDENT_AMBULATORY_CARE_PROVIDER_SITE_OTHER)
Admission: RE | Admit: 2016-11-21 | Discharge: 2016-11-21 | Disposition: A | Discharge status: Home / Self Care | Payer: BLUE CROSS/BLUE SHIELD | Source: Ambulatory Visit | Attending: Nurse Practitioner | Admitting: Nurse Practitioner

## 2016-11-21 VITALS — BP 118/78 | HR 96 | Temp 98.5°F | Ht 64.0 in | Wt 266.0 lb

## 2016-11-21 DIAGNOSIS — R51 Headache: Secondary | ICD-10-CM

## 2016-11-21 DIAGNOSIS — R42 Dizziness and giddiness: Secondary | ICD-10-CM | POA: Diagnosis not present

## 2016-11-21 DIAGNOSIS — R519 Headache, unspecified: Secondary | ICD-10-CM

## 2016-11-21 DIAGNOSIS — R55 Syncope and collapse: Secondary | ICD-10-CM

## 2016-11-21 LAB — TSH: TSH: 2.82 u[IU]/mL (ref 0.35–4.50)

## 2016-11-21 LAB — COMPREHENSIVE METABOLIC PANEL
ALT: 13 U/L (ref 0–35)
AST: 12 U/L (ref 0–37)
Albumin: 4 g/dL (ref 3.5–5.2)
Alkaline Phosphatase: 87 U/L (ref 39–117)
BUN: 11 mg/dL (ref 6–23)
CO2: 25 mEq/L (ref 19–32)
Calcium: 9.3 mg/dL (ref 8.4–10.5)
Chloride: 106 mEq/L (ref 96–112)
Creatinine, Ser: 0.73 mg/dL (ref 0.40–1.20)
GFR: 96.36 mL/min (ref >60.00)
Glucose, Bld: 109 mg/dL — ABNORMAL HIGH (ref 70–99)
Potassium: 3.9 mEq/L (ref 3.5–5.1)
Sodium: 140 mEq/L (ref 135–145)
Total Bilirubin: 0.7 mg/dL (ref 0.2–1.2)
Total Protein: 7.4 g/dL (ref 6.0–8.3)

## 2016-11-21 LAB — CBC WITH DIFFERENTIAL/PLATELET
Basophils Absolute: 0 10*3/uL (ref 0.0–0.1)
Basophils Relative: 0.4 % (ref 0.0–3.0)
Eosinophils Absolute: 0.3 10*3/uL (ref 0.0–0.7)
Eosinophils Relative: 2.5 % (ref 0.0–5.0)
HCT: 35.8 % — ABNORMAL LOW (ref 36.0–46.0)
Hemoglobin: 12.2 g/dL (ref 12.0–15.0)
Lymphocytes Relative: 22.9 % (ref 12.0–46.0)
Lymphs Abs: 2.4 10*3/uL (ref 0.7–4.0)
MCHC: 34.2 g/dL (ref 30.0–36.0)
MCV: 84.2 fl (ref 78.0–100.0)
Monocytes Absolute: 0.4 10*3/uL (ref 0.1–1.0)
Monocytes Relative: 3.4 % (ref 3.0–12.0)
Neutro Abs: 7.4 10*3/uL (ref 1.4–7.7)
Neutrophils Relative %: 70.8 % (ref 43.0–77.0)
Platelets: 340 10*3/uL (ref 150.0–400.0)
RBC: 4.25 Mil/uL (ref 3.87–5.11)
RDW: 13.5 % (ref 11.5–15.5)
WBC: 10.5 10*3/uL (ref 4.0–10.5)

## 2016-11-21 NOTE — Progress Notes (Signed)
Pre visit review using our clinic review tool, if applicable. No additional management support is needed unless otherwise documented below in the visit note. 

## 2016-11-21 NOTE — Progress Notes (Signed)
Normal results, see office note

## 2016-11-21 NOTE — Progress Notes (Signed)
Subjective:  Patient ID: Julie Mcneil, female    DOB: Feb 25, 1981  Age: 36 y.o. MRN: [id number]  CC: Dizziness (dizzy at time---when that happend,feel like passing out--pressure from right eye back to ear. going on for couple months)   Dizziness  This is a new problem. The current episode started more than 1 month ago. The problem occurs intermittently. The problem has been gradually worsening. Associated symptoms include chills, headaches and vertigo. Pertinent negatives include no abdominal pain, anorexia, chest pain, congestion, coughing, diaphoresis, fatigue, fever, joint swelling, myalgias, nausea, neck pain, numbness, rash, sore throat, swollen glands, urinary symptoms, visual change, vomiting or weakness. Nothing aggravates the symptoms. She has tried rest for the symptoms. The treatment provided mild relief.    Outpatient Medications Prior to Visit  Medication Sig Dispense Refill  . ibuprofen (ADVIL,MOTRIN) 200 MG tablet Take 3 tablets (600 mg total) by mouth every 6 (six) hours as needed for fever or mild pain. 30 tablet 1  . escitalopram (LEXAPRO) 10 MG tablet Take 1 tablet (10 mg total) by mouth daily. 90 tablet 3  . cephALEXin (KEFLEX) 500 MG capsule Take 1 capsule (500 mg total) by mouth 4 (four) times daily. (Patient not taking: Reported on 11/21/2016) 40 capsule 0  . nystatin-triamcinolone (MYCOLOG II) cream Apply 1 application topically 2 (two) times daily. (Patient not taking: Reported on 11/21/2016) 30 g 0   No facility-administered medications prior to visit.     ROS See HPI  Objective:  BP 118/78 (BP Location: Left Arm, Patient Position: Standing, Cuff Size: Large)   Pulse 96 Comment: standing  Temp 98.5 F (36.9 C)   Ht _0  (1.626 m)   Wt 266 lb (120.7 kg)   SpO2 99%   BMI 45.66 kg/m   BP Readings from Last 3 Encounters:  11/21/16 118/78  02/02/16 136/80  01/17/16 104/76    Wt Readings from Last 3 Encounters:  11/21/16 266 lb (120.7 kg)  02/02/16  268 lb (121.6 kg)  01/17/16 264 lb 4.8 oz (119.9 kg)    Physical Exam  Constitutional: She is oriented to person, place, and time. No distress.  HENT:  Right Ear: External ear normal.  Left Ear: External ear normal.  Nose: No mucosal edema or rhinorrhea. Right sinus exhibits maxillary sinus tenderness and frontal sinus tenderness. Left sinus exhibits no maxillary sinus tenderness and no frontal sinus tenderness.  Mouth/Throat: No oropharyngeal exudate or posterior oropharyngeal erythema.  Eyes: Conjunctivae and EOM are normal. Pupils are equal, round, and reactive to light. No scleral icterus.  Neck: Normal range of motion. Neck supple. No thyromegaly present.  Cardiovascular: Normal rate, regular rhythm and normal heart sounds.   Pulmonary/Chest: Effort normal and breath sounds normal. No respiratory distress.  Abdominal: Soft. She exhibits no distension.  Musculoskeletal: Normal range of motion. She exhibits no edema.  Lymphadenopathy:    She has no cervical adenopathy.  Neurological: She is alert and oriented to person, place, and time. No cranial nerve deficit. Coordination normal.  Skin: Skin is warm and dry.  Psychiatric: She has a normal mood and affect. Her behavior is normal.  Vitals reviewed.   Lab Results  Component Value Date   WBC 12.1 (H) 09/20/2015   HGB 12.1 09/20/2015   HCT 36.9 09/20/2015   PLT 355.0 09/20/2015   GLUCOSE 103 (H) 06/29/2015   CHOL 189 08/25/2013   TRIG 109 08/25/2013   HDL 54 08/25/2013   LDLCALC 113 (H) 08/25/2013   ALT 15 05/24/2015  AST 14 05/24/2015   NA 139 06/29/2015   K 4.0 06/29/2015   CL 107 06/29/2015   CREATININE 0.78 06/29/2015   BUN 11 06/29/2015   CO2 26 06/29/2015   TSH 2.41 09/20/2015   HGBA1C 5.6 09/20/2015    No results found.  Assessment & Plan:   Necola was seen today for dizziness.  Diagnoses and all orders for this visit:  Dizziness -     Orthostatic vital signs -     Comp Met (CMET); Future -      CBC w/Diff; Future -     TSH; Future -     CT Head Wo Contrast; Future  Pre-syncope -     Orthostatic vital signs -     Comp Met (CMET); Future -     CBC w/Diff; Future -     TSH; Future -     CT Head Wo Contrast; Future  Pressure in head -     Orthostatic vital signs -     Comp Met (CMET); Future -     CBC w/Diff; Future -     TSH; Future -     CT Head Wo Contrast; Future   I have discontinued Ms. [place] cephALEXin and nystatin-triamcinolone. I am also having her maintain her ibuprofen and escitalopram.  No orders of the defined types were placed in this encounter.   Follow-up: No Follow-up on file.  Wilfred Lacy, NP

## 2016-11-21 NOTE — Patient Instructions (Addendum)
Encourage adequate oral hydration. Change positions slowly. Go to basement for lab draw. You will be called with lab results  Dizziness Dizziness is a common problem. It is a feeling of unsteadiness or light-headedness. You may feel like you are about to faint. Dizziness can lead to injury if you stumble or fall. Anyone can become dizzy, but dizziness is more common in older adults. This condition can be caused by a number of things, including medicines, dehydration, or illness. Follow these instructions at home: Taking these steps may help with your condition: Eating and drinking  Drink enough fluid to keep your urine clear or pale yellow. This helps to keep you from becoming dehydrated. Try to drink more clear fluids, such as water.  Do not drink alcohol.  Limit your caffeine intake if directed by your health care provider.  Limit your salt intake if directed by your health care provider. Activity  Avoid making quick movements.  Rise slowly from chairs and steady yourself until you feel okay.  In the morning, first sit up on the side of the bed. When you feel okay, stand slowly while you hold onto something until you know that your balance is fine.  Move your legs often if you need to stand in one place for a long time. Tighten and relax your muscles in your legs while you are standing.  Do not drive or operate heavy machinery if you feel dizzy.  Avoid bending down if you feel dizzy. Place items in your home so that they are easy for you to reach without leaning over. Lifestyle  Do not use any tobacco products, including cigarettes, chewing tobacco, or electronic cigarettes. If you need help quitting, ask your health care provider.  Try to reduce your stress level, such as with yoga or meditation. Talk with your health care provider if you need help. General instructions  Watch your dizziness for any changes.  Take medicines only as directed by your health care provider.  Talk with your health care provider if you think that your dizziness is caused by a medicine that you are taking.  Tell a friend or a family member that you are feeling dizzy. If he or she notices any changes in your behavior, have this person call your health care provider.  Keep all follow-up visits as directed by your health care provider. This is important. Contact a health care provider if:  Your dizziness does not go away.  Your dizziness or light-headedness gets worse.  You feel nauseous.  You have reduced hearing.  You have new symptoms.  You are unsteady on your feet or you feel like the room is spinning. Get help right away if:  You vomit or have diarrhea and are unable to eat or drink anything.  You have problems talking, walking, swallowing, or using your arms, hands, or legs.  You feel generally weak.  You are not thinking clearly or you have trouble forming sentences. It may take a friend or family member to notice this.  You have chest pain, abdominal pain, shortness of breath, or sweating.  Your vision changes.  You notice any bleeding.  You have a headache.  You have neck pain or a stiff neck.  You have a fever. This information is not intended to replace advice given to you by your health care provider. Make sure you discuss any questions you have with your health care provider. Document Released: 04/18/2001 Document Revised: 03/30/2016 Document Reviewed: 10/19/2014 Elsevier Interactive Patient Education  2017 Elsevier Inc.  

## 2016-11-24 ENCOUNTER — Emergency Department (HOSPITAL_COMMUNITY)
Admission: EM | Admit: 2016-11-24 | Discharge: 2016-11-25 | Disposition: A | Discharge status: Home / Self Care | Payer: BLUE CROSS/BLUE SHIELD | Attending: Emergency Medicine | Admitting: Emergency Medicine

## 2016-11-24 ENCOUNTER — Encounter (HOSPITAL_COMMUNITY): Payer: Self-pay | Admitting: *Deleted

## 2016-11-24 DIAGNOSIS — H81399 Other peripheral vertigo, unspecified ear: Secondary | ICD-10-CM | POA: Diagnosis not present

## 2016-11-24 DIAGNOSIS — R42 Dizziness and giddiness: Secondary | ICD-10-CM | POA: Diagnosis present

## 2016-11-24 LAB — URINALYSIS, ROUTINE W REFLEX MICROSCOPIC
Bilirubin Urine: NEGATIVE
Glucose, UA: NEGATIVE mg/dL
Ketones, ur: NEGATIVE mg/dL
Leukocytes, UA: NEGATIVE
Nitrite: NEGATIVE
Protein, ur: NEGATIVE mg/dL
Specific Gravity, Urine: 1.013 (ref 1.005–1.030)
pH: 5 (ref 5.0–8.0)

## 2016-11-24 LAB — CBC
HCT: 38.3 % (ref 36.0–46.0)
Hemoglobin: 12.8 g/dL (ref 12.0–15.0)
MCH: 28.1 pg (ref 26.0–34.0)
MCHC: 33.4 g/dL (ref 30.0–36.0)
MCV: 84 fL (ref 78.0–100.0)
Platelets: 390 10*3/uL (ref 150–400)
RBC: 4.56 MIL/uL (ref 3.87–5.11)
RDW: 13.2 % (ref 11.5–15.5)
WBC: 16.9 10*3/uL — ABNORMAL HIGH (ref 4.0–10.5)

## 2016-11-24 LAB — CBG MONITORING, ED: Glucose-Capillary: 95 mg/dL (ref 65–99)

## 2016-11-24 LAB — BASIC METABOLIC PANEL
Anion gap: 8 (ref 5–15)
BUN: 9 mg/dL (ref 6–20)
CO2: 22 mmol/L (ref 22–32)
Calcium: 9.1 mg/dL (ref 8.9–10.3)
Chloride: 109 mmol/L (ref 101–111)
Creatinine, Ser: 0.74 mg/dL (ref 0.44–1.00)
GFR calc Af Amer: >60 mL/min (ref >60)
GFR calc non Af Amer: >60 mL/min (ref >60)
Glucose, Bld: 121 mg/dL — ABNORMAL HIGH (ref 65–99)
Potassium: 4 mmol/L (ref 3.5–5.1)
Sodium: 139 mmol/L (ref 135–145)

## 2016-11-24 MED ORDER — MECLIZINE HCL 25 MG PO TABS
25.0000 mg | ORAL_TABLET | Freq: Once | ORAL | Status: AC
  Administered 2016-11-25: 25 mg via ORAL
  Filled 2016-11-24: qty 1

## 2016-11-24 NOTE — ED Triage Notes (Addendum)
Pt reports symptoms on and off for about a week, Nausea, dizziness, worse after lunch, papatations

## 2016-11-24 NOTE — ED Provider Notes (Addendum)
[facility]-EMERGENCY DEPT Provider Note   CSN: [id number] Arrival date & time: 11/24/16  1511    By signing my name below, I, Julie Mcneil, attest that this documentation has been prepared under the direction and in the presence of Julie Booze, MD. Electronically Signed: Valentino Mcneil, ED Scribe. 11/24/16. 12:00 AM.  History   Chief Complaint Chief Complaint  Patient presents with  . Dizziness  . Emesis   The history is provided by the patient and the spouse. No language interpreter was used.  Emesis   Pertinent negatives include no abdominal pain and no fever.   HPI Comments: Julie Mcneil is a 36 y.o. female with PMHx of anxiety, GERD, cervical dysplasia, who presents to the Emergency Department complaining of gradually worsening, intermittent, dizziness onset three months ago. Pt reports associated near syncope feeling accompanied by nausea. She describes her dizziness as a "faint feeling". She notes her dizziness is worsened when she turns her head to the side too quickly and looking down. She states she had a near syncope feeling that occurred last night after having dinner. Pt notes she was sitting down wacthing television when her dizzy spell began. Pt also notes having a tightness in chest, feels like her "chest is racing". Pt notes taking meclizine with minimal relief. Pt notes she was last seen by her PCP on 01/16 for similar symptoms. Denies fever, abdominal pain and vomiting. Pt notes she has an upcoming appointment with her PCP on 01/23.   Past Medical History:  Diagnosis Date  . Anxiety    Panic attacks  . Cervical dysplasia   . GERD (gastroesophageal reflux disease)   . Obesity   . UTI (urinary tract infection)    recurrent  . Vaginal yeast infection    recurrent    Patient Active Problem List   Diagnosis Date Noted  . Generalized anxiety disorder 02/02/2016  . Acute upper respiratory infection 02/02/2016  . Dysuria 02/02/2016  . Acute sinus infection  11/22/2015  . Anxiety state 11/22/2015  . Fatigue 09/20/2015  . Diarrhea 08/25/2015  . Cervicogenic headache 06/14/2015  . Nonallopathic lesion of cervical region 06/14/2015  . Neck pain on right side 05/24/2015  . Routine general medical examination at a health care facility 02/15/2015  . Abnormal urine odor 12/28/2014  . Severe obesity (BMI >= 40) (HCC) 12/28/2014  . Flank pain 11/17/2014  . Headache 04/03/2014  . Heart palpitations 01/22/2014  . Obesity 03/21/2012    Past Surgical History:  Procedure Laterality Date  . CESAREAN SECTION  4098,1191   11-28-11  . COLPOSCOPY    . TUBAL LIGATION     2013 C-SECTION    OB History    Gravida Para Term Preterm AB Living   4 3 3  0 1 3   SAB TAB Ectopic Multiple Live Births   0 0 1 0 3       Home Medications    Prior to Admission medications   Medication Sig Start Date End Date Taking? Authorizing Provider  ibuprofen (ADVIL,MOTRIN) 200 MG tablet Take 3 tablets (600 mg total) by mouth every 6 (six) hours as needed for fever or mild pain. 05/14/15  Yes Harrington Challenger, NP  ranitidine (ZANTAC) 150 MG tablet Take 150 mg by mouth daily as needed for heartburn.   Yes Historical Provider, MD  escitalopram (LEXAPRO) 10 MG tablet Take 1 tablet (10 mg total) by mouth daily. 02/02/16 05/02/16  Corwin Levins, MD  meclizine (ANTIVERT) 25 MG tablet Take 1  tablet (25 mg total) by mouth 3 (three) times daily as needed for dizziness. 11/25/16   Julie Boozeavid Jeidy Hoerner, MD    Family History Family History  Problem Relation Age of Onset  . Early death Mother   . Heart disease Maternal Grandmother   . Diabetes Maternal Grandmother   . Cancer Maternal Grandmother     Lymphoma  . Hypertension Paternal Grandmother   . Stroke Paternal Grandmother   . Hypertension Paternal Grandfather   . Anesthesia problems Neg Hx   . Hypotension Neg Hx   . Malignant hyperthermia Neg Hx   . Pseudochol deficiency Neg Hx     Social History Social History  Substance Use  Topics  . Smoking status: Never Smoker  . Smokeless tobacco: Never Used  . Alcohol use No     Allergies   Sumatriptan; Levaquin [levofloxacin in d5w]; and Nitrofurantoin monohyd macro   Review of Systems Review of Systems  Constitutional: Negative for fever.  Cardiovascular: Positive for chest pain.  Gastrointestinal: Positive for nausea. Negative for abdominal pain and vomiting.  Neurological: Positive for dizziness.  All other systems reviewed and are negative.    Physical Exam Updated Vital Signs BP 142/94   Pulse 80   Temp 98.4 F (36.9 C) (Oral)   Resp 14   Wt 264 lb (119.7 kg)   LMP 10/25/2016   SpO2 99%   BMI 45.32 kg/m   Physical Exam  Constitutional: She is oriented to person, place, and time. She appears well-developed and well-nourished.  HENT:  Head: Normocephalic and atraumatic.  Eyes: EOM are normal. Pupils are equal, round, and reactive to light.  Neck: Normal range of motion. Neck supple. No JVD present.  No carotid bruit.  Cardiovascular: Normal rate, regular rhythm and normal heart sounds.   No murmur heard. Pulmonary/Chest: Effort normal and breath sounds normal. She has no wheezes. She has no rales. She exhibits no tenderness.  Abdominal: Soft. Bowel sounds are normal. She exhibits no distension and no mass. There is no tenderness.  Musculoskeletal: Normal range of motion. She exhibits no edema.  Lymphadenopathy:    She has no cervical adenopathy.  Neurological: She is alert and oriented to person, place, and time. No cranial nerve deficit. She exhibits normal muscle tone. Coordination normal.  No nystagmus. Dizziness is reproduced by passive head movement.   Skin: Skin is warm and dry. No rash noted.  Psychiatric: She has a normal mood and affect. Her behavior is normal. Judgment and thought content normal.  Nursing note and vitals reviewed.    ED Treatments / Results   DIAGNOSTIC STUDIES: Oxygen Saturation is 99% on RA, normal by my  interpretation.    COORDINATION OF CARE: 11:54 PM Discussed treatment plan with pt at bedside which includes labs, EKG and antihistamine and pt agreed to plan.   Labs (all labs ordered are listed, but only abnormal results are displayed) Labs Reviewed  BASIC METABOLIC PANEL - Abnormal; Notable for the following:       Result Value   Glucose, Bld 121 (*)    All other components within normal limits  CBC - Abnormal; Notable for the following:    WBC 16.9 (*)    All other components within normal limits  URINALYSIS, ROUTINE W REFLEX MICROSCOPIC - Abnormal; Notable for the following:    APPearance HAZY (*)    Hgb urine dipstick MODERATE (*)    Bacteria, UA RARE (*)    Squamous Epithelial / LPF 6-30 (*)  All other components within normal limits  CBG MONITORING, ED    EKG  EKG Interpretation  Date/Time:  Friday November 24 2016 15:56:44 EST Ventricular Rate:  112 PR Interval:    QRS Duration: 95 QT Interval:  315 QTC Calculation: 430 R Axis:   73 Text Interpretation:  Sinus tachycardia Otherwise within normal limits When compared with ECG of 06/29/2015, HEART RATE has increased Confirmed by [facility]  MD, Ikram Riebe ([id number]) on 11/25/2016 12:06:22 AM       Procedures Procedures (including critical care time)  Medications Ordered in ED Medications  meclizine (ANTIVERT) tablet 25 mg (not administered)     Initial Impression / Assessment and Plan / ED Course  I have reviewed the triage vital signs and the nursing notes.  Pertinent labs & imaging results that were available during my care of the patient were reviewed by me and considered in my medical decision making (see chart for details).   dizziness which appears to be peripheral vertigo based on pattern of symptoms and physical exam. Symptoms of been present for 3 months without significant worsening which is reassuring. Old records are reviewed confirming recent office visit with workup including negative CBC and metabolic  panel and normal CT of head. Workup today shows mild elevation of blood glucose 2 range considered prediabetic. This does not have any clinical significance today, but I have discussed this with the patient. Prior glucoses have been mildly elevated as well but none in the overtly diabetic range. She will be given a trial of meclizine if symptoms persist or worsen, she should consider MRI of the brain.  Final Clinical Impressions(s) / ED Diagnoses   Final diagnoses:  Peripheral vertigo, unspecified laterality    New Prescriptions New Prescriptions   MECLIZINE (ANTIVERT) 25 MG TABLET    Take 1 tablet (25 mg total) by mouth 3 (three) times daily as needed for dizziness.    I personally performed the services described in this documentation, which was scribed in my presence. The recorded information has been reviewed and is accurate.       Julie Booze, MD 11/25/16 Ivor Reining    Julie Booze, MD 11/25/16 [phone]

## 2016-11-25 MED ORDER — MECLIZINE HCL 25 MG PO TABS: 25.0000 mg | ORAL_TABLET | Freq: Three times a day (TID) | ORAL | 0 refills | Status: DC | PRN

## 2016-11-25 NOTE — Discharge Instructions (Signed)
If your symptoms persist, talk with your doctor about getting an MRI scan of your brain.

## 2016-11-28 ENCOUNTER — Ambulatory Visit (INDEPENDENT_AMBULATORY_CARE_PROVIDER_SITE_OTHER): Payer: BLUE CROSS/BLUE SHIELD | Admitting: Internal Medicine

## 2016-11-28 ENCOUNTER — Encounter: Payer: Self-pay | Admitting: Internal Medicine

## 2016-11-28 ENCOUNTER — Ambulatory Visit (INDEPENDENT_AMBULATORY_CARE_PROVIDER_SITE_OTHER)
Admission: RE | Admit: 2016-11-28 | Discharge: 2016-11-28 | Disposition: A | Discharge status: Home / Self Care | Payer: BLUE CROSS/BLUE SHIELD | Source: Ambulatory Visit | Attending: Internal Medicine | Admitting: Internal Medicine

## 2016-11-28 ENCOUNTER — Other Ambulatory Visit (INDEPENDENT_AMBULATORY_CARE_PROVIDER_SITE_OTHER): Payer: BLUE CROSS/BLUE SHIELD

## 2016-11-28 VITALS — BP 130/72 | HR 83 | Temp 98.8°F | Resp 20 | Wt 259.0 lb

## 2016-11-28 DIAGNOSIS — R509 Fever, unspecified: Secondary | ICD-10-CM | POA: Diagnosis not present

## 2016-11-28 DIAGNOSIS — R739 Hyperglycemia, unspecified: Secondary | ICD-10-CM

## 2016-11-28 DIAGNOSIS — R300 Dysuria: Secondary | ICD-10-CM

## 2016-11-28 MED ORDER — AZITHROMYCIN 250 MG PO TABS: ORAL_TABLET | ORAL | 1 refills | Status: DC

## 2016-11-28 NOTE — Progress Notes (Signed)
Subjective:    Patient ID: Julie Mcneil, female    DOB: 1981-09-19, 36 y.o.   MRN: [id number]  HPI  Here to f/u, former pt of Dr Felicity Coyer usually in overall good health, with hx of obesity, anxiety, hyperglycemia.  Today c/o recent mild URI symptoms several days she feels is overall improved without facial pain, pressure, headache, ST or cough, but pt denies chest pain, wheezing, increased sob or doe, orthopnea, PND, increased LE swelling, palpitations, dizziness or syncope. Last PM however she states temp 101.9, no chills.  Has also onset mild dysuria but Denies urinary symptoms of frequency, urgency, flank pain, hematuria or n/v.   Pt denies polydipsia, polyuria.  No other new history Past Medical History:  Diagnosis Date  . Anxiety    Panic attacks  . Cervical dysplasia   . GERD (gastroesophageal reflux disease)   . Obesity   . UTI (urinary tract infection)    recurrent  . Vaginal yeast infection    recurrent   Past Surgical History:  Procedure Laterality Date  . CESAREAN SECTION  4098,1191   11-28-11  . COLPOSCOPY    . TUBAL LIGATION     2013 C-SECTION    reports that she has never smoked. She has never used smokeless tobacco. She reports that she does not drink alcohol or use drugs. family history includes Cancer in her maternal grandmother; Diabetes in her maternal grandmother; Early death in her mother; Heart disease in her maternal grandmother; Hypertension in her paternal grandfather and paternal grandmother; Stroke in her paternal grandmother. Allergies  Allergen Reactions  . Sumatriptan Shortness Of Breath    Tight chest  . Levaquin [Levofloxacin In D5w] Nausea Only  . Nitrofurantoin Monohyd Macro Hives and Nausea And Vomiting   Current Outpatient Prescriptions on File Prior to Visit  Medication Sig Dispense Refill  . ibuprofen (ADVIL,MOTRIN) 200 MG tablet Take 3 tablets (600 mg total) by mouth every 6 (six) hours as needed for fever or mild pain. 30 tablet 1  .  meclizine (ANTIVERT) 25 MG tablet Take 1 tablet (25 mg total) by mouth 3 (three) times daily as needed for dizziness. 30 tablet 0  . ranitidine (ZANTAC) 150 MG tablet Take 150 mg by mouth daily as needed for heartburn.     No current facility-administered medications on file prior to visit.    Review of Systems  Constitutional: Negative for unusual diaphoresis or night sweats HENT: Negative for ear swelling or discharge Eyes: Negative for worsening visual haziness  Respiratory: Negative for choking and stridor.   Gastrointestinal: Negative for distension or worsening eructation Genitourinary: Negative for retention or change in urine volume.  Musculoskeletal: Negative for other MSK pain or swelling Skin: Negative for color change and worsening wound Neurological: Negative for tremors and numbness other than noted  Psychiatric/Behavioral: Negative for decreased concentration or agitation other than above   All other system neg per pt    Objective:   Physical Exam BP 130/72   Pulse 83   Temp 98.8 F (37.1 C) (Oral)   Resp 20   Wt 259 lb (117.5 kg)   SpO2 98%   BMI 44.46 kg/m  VS noted,  Constitutional: Pt appears in no apparent distress HENT: Head: NCAT.  Right Ear: External ear normal.  Left Ear: External ear normal.  Bilat tm's with mild erythema.  Julie sinus areas non tender.  Pharynx with mild erythema, no exudate Eyes: . Pupils are equal, round, and reactive to light. Conjunctivae and EOM  are normal Neck: Normal range of motion. Neck supple. with bilat mild submandib LA Cardiovascular: Normal rate and regular rhythm.   Pulmonary/Chest: Effort normal and breath sounds without rales or wheezing.  Abd:  Soft, NT, ND, + BS, no flank tender Neurological: Pt is alert. Not confused , motor grossly intact Skin: Skin is warm. No rash, no LE edema Psychiatric: Pt behavior is normal. No agitation.   POCT - flu test - neg  POCT - A1c - 5.6  Lab Results  Component Value Date    WBC 16.9 (H) 11/24/2016   HGB 12.8 11/24/2016   HCT 38.3 11/24/2016   PLT 390 11/24/2016   GLUCOSE 121 (H) 11/24/2016   CHOL 189 08/25/2013   TRIG 109 08/25/2013   HDL 54 08/25/2013   LDLCALC 113 (H) 08/25/2013   ALT 13 11/21/2016   AST 12 11/21/2016   NA 139 11/24/2016   K 4.0 11/24/2016   CL 109 11/24/2016   CREATININE 0.74 11/24/2016   BUN 9 11/24/2016   CO2 22 11/24/2016   TSH 2.82 11/21/2016   HGBA1C 5.6 09/20/2015      Assessment & Plan:

## 2016-11-28 NOTE — Progress Notes (Signed)
Pre visit review using our clinic review tool, if applicable. No additional management support is needed unless otherwise documented below in the visit note. 

## 2016-11-28 NOTE — Patient Instructions (Addendum)
Your flu test was negative, and the sugar test was normal  Please take all new medication as prescribed  - the antibiotic  You can also take Delsym OTC for cough, and/or Mucinex (or it's generic off brand) for congestion, and tylenol as needed for pain.4  Please continue all other medications as before  Please have the pharmacy call with any other refills you may need.  Please keep your appointments with your specialists as you may have planned  Please go to the XRAY Department in the Basement (go straight as you get off the elevator) for the x-ray testing  Please go to the LAB in the Basement (turn left off the elevator) for the tests to be done today  You will be contacted by phone if any changes need to be made immediately.  Otherwise, you will receive a letter about your results with an explanation, but please check with MyChart first.  Please return in 6 months, or sooner if needed

## 2016-11-29 LAB — URINALYSIS, ROUTINE W REFLEX MICROSCOPIC
Bilirubin Urine: NEGATIVE
Ketones, ur: NEGATIVE
Nitrite: NEGATIVE
Specific Gravity, Urine: 1.02 (ref 1.000–1.030)
Total Protein, Urine: NEGATIVE
Urine Glucose: NEGATIVE
Urobilinogen, UA: 0.2 (ref 0.0–1.0)
pH: 6 (ref 5.0–8.0)

## 2016-11-29 LAB — URINE CULTURE

## 2016-12-01 ENCOUNTER — Telehealth: Payer: Self-pay | Admitting: *Deleted

## 2016-12-01 MED ORDER — ESCITALOPRAM OXALATE 10 MG PO TABS: 10.0000 mg | ORAL_TABLET | Freq: Every day | ORAL | 3 refills | Status: DC

## 2016-12-01 NOTE — Telephone Encounter (Signed)
Rec'd call pt states she is not sure if its her anxiety kicking in. She states she saw MD on Tues still taking med MD rx but she states she can't think, not focus, lighthead, just all over the place. Wanting something to calm her down. Verified if she is taking the Lexapro pt stated that she never took med. Pls advise...Raechel Chute/lmb

## 2016-12-01 NOTE — Telephone Encounter (Signed)
Notified pt w/MD response.../lmb 

## 2016-12-01 NOTE — Telephone Encounter (Signed)
Ok to start the lexapro  - 10 qd - done erx, I can also refer to counseling if she wants

## 2016-12-04 ENCOUNTER — Encounter: Payer: Self-pay | Admitting: Internal Medicine

## 2016-12-04 ENCOUNTER — Ambulatory Visit (INDEPENDENT_AMBULATORY_CARE_PROVIDER_SITE_OTHER): Payer: BLUE CROSS/BLUE SHIELD | Admitting: Family Medicine

## 2016-12-04 ENCOUNTER — Encounter: Payer: Self-pay | Admitting: Family Medicine

## 2016-12-04 VITALS — BP 122/88 | HR 72 | Temp 98.6°F | Wt 255.4 lb

## 2016-12-04 DIAGNOSIS — H6591 Unspecified nonsuppurative otitis media, right ear: Secondary | ICD-10-CM | POA: Diagnosis not present

## 2016-12-04 DIAGNOSIS — R42 Dizziness and giddiness: Secondary | ICD-10-CM

## 2016-12-04 MED ORDER — AMOXICILLIN 500 MG PO CAPS: 500.0000 mg | ORAL_CAPSULE | Freq: Three times a day (TID) | ORAL | 0 refills | Status: DC

## 2016-12-04 NOTE — Assessment & Plan Note (Signed)
?   Significance, also for urine cx, for antibx if abnormal,  to f/u any worsening symptoms or concerns

## 2016-12-04 NOTE — Progress Notes (Signed)
Pre visit review using our clinic review tool, if applicable. No additional management support is needed unless otherwise documented below in the visit note. 

## 2016-12-04 NOTE — Patient Instructions (Addendum)
Please take antibiotic as directed. Follow up with Dr. Jonny RuizJohn as we discussed.  Dizziness Dizziness is a common problem. It is a feeling of unsteadiness or light-headedness. You may feel like you are about to faint. Dizziness can lead to injury if you stumble or fall. Anyone can become dizzy, but dizziness is more common in older adults. This condition can be caused by a number of things, including medicines, dehydration, or illness. Follow these instructions at home: Taking these steps may help with your condition: Eating and drinking  Drink enough fluid to keep your urine clear or pale yellow. This helps to keep you from becoming dehydrated. Try to drink more clear fluids, such as water.  Do not drink alcohol.  Limit your caffeine intake if directed by your health care provider.  Limit your salt intake if directed by your health care provider. Activity  Avoid making quick movements.  Rise slowly from chairs and steady yourself until you feel okay.  In the morning, first sit up on the side of the bed. When you feel okay, stand slowly while you hold onto something until you know that your balance is fine.  Move your legs often if you need to stand in one place for a long time. Tighten and relax your muscles in your legs while you are standing.  Do not drive or operate heavy machinery if you feel dizzy.  Avoid bending down if you feel dizzy. Place items in your home so that they are easy for you to reach without leaning over. Lifestyle  Do not use any tobacco products, including cigarettes, chewing tobacco, or electronic cigarettes. If you need help quitting, ask your health care provider.  Try to reduce your stress level, such as with yoga or meditation. Talk with your health care provider if you need help. General instructions  Watch your dizziness for any changes.  Take medicines only as directed by your health care provider. Talk with your health care provider if you think  that your dizziness is caused by a medicine that you are taking.  Tell a friend or a family member that you are feeling dizzy. If he or she notices any changes in your behavior, have this person call your health care provider.  Keep all follow-up visits as directed by your health care provider. This is important. Contact a health care provider if:  Your dizziness does not go away.  Your dizziness or light-headedness gets worse.  You feel nauseous.  You have reduced hearing.  You have new symptoms.  You are unsteady on your feet or you feel like the room is spinning. Get help right away if:  You vomit or have diarrhea and are unable to eat or drink anything.  You have problems talking, walking, swallowing, or using your arms, hands, or legs.  You feel generally weak.  You are not thinking clearly or you have trouble forming sentences. It may take a friend or family member to notice this.  You have chest pain, abdominal pain, shortness of breath, or sweating.  Your vision changes.  You notice any bleeding.  You have a headache.  You have neck pain or a stiff neck.  You have a fever. This information is not intended to replace advice given to you by your health care provider. Make sure you discuss any questions you have with your health care provider. Document Released: 04/18/2001 Document Revised: 03/30/2016 Document Reviewed: 10/19/2014 Elsevier Interactive Patient Education  2017 [employer]  Otitis Media, Adult  Otitis media is redness, soreness, and puffiness (swelling) in the space just behind your eardrum (middle ear). It may be caused by allergies or infection. It often happens along with a cold. Follow these instructions at home:  Take your medicine as told. Finish it even if you start to feel better.  Only take over-the-counter or prescription medicines for pain, discomfort, or fever as told by your doctor.  Follow up with your doctor as told. Contact a  doctor if:  You have otitis media only in one ear, or bleeding from your nose, or both.  You notice a lump on your neck.  You are not getting better in 3-5 days.  You feel worse instead of better. Get help right away if:  You have pain that is not helped with medicine.  You have puffiness, redness, or pain around your ear.  You get a stiff neck.  You cannot move part of your face (paralysis).  You notice that the bone behind your ear hurts when you touch it. This information is not intended to replace advice given to you by your health care provider. Make sure you discuss any questions you have with your health care provider. Document Released: 04/10/2008 Document Revised: 03/30/2016 Document Reviewed: 05/20/2013 Elsevier Interactive Patient Education  2017 [employer].

## 2016-12-04 NOTE — Assessment & Plan Note (Signed)
Normal a1c, cont to work on wt loss, diet efforts

## 2016-12-04 NOTE — Progress Notes (Signed)
Subjective:    Patient ID: Julie Mcneil, female    DOB: 10/31/1981, 36 y.o.   MRN: [id number]  HPI  Julie Mcneil is a 36 year old female who presents today with nausea and dizziness that is intermittent  for the past 3 days. She reports that dizziness has occurred each day and this is more frequent that previous episodes that she experienced in the fall. Associated dizziness has been present for the past 2 months but also noted as 5 months ago per patient, left shoulder pain that has been intermittent for the past 3 days but is not present at this time, and right ear pain and pressure which started 3 days ago.  She denies chest pain, palpitations, SOB, diaphoresis, fatigue, rash, sore throat, swollen glands, visual changes, vomiting, abdominal pain, numbness, tingling, weakness, syncope.   She has been evaluated for dizziness on 11/21/16 with associated symptoms of chills, and headaches.  CT was completed on 11/21/16 which was unremarkable. She was evaluated on 11/24/16 in the ED for similar symptoms where she was evaluated with symptoms of dizziness, and near syncope feeling. She also noted tightness in chest stating she felt like like her heart was racing. She was diagnosed with vertigo and provided meclizine with limited benefit.  EKG on 11/24/16 while in the hospital noted noted as sinus tachycardia and otherwise normal. HR noted at 112 at that time.  She also was evaluated and treated for dysuria and a chest X-ray for ongoing fever on 11/28/16 which was unremarkable. She was treated for anxiety following prior workups noted which did not reveal origin or symptoms.  Lexapro was provided by her PCP for symptoms with a recommendation to follow up. She reports taking one lexapro and then noted nausea with one episode of vomiting 90 minutes later and she has not taken any additional pills at this time.  Review of Systems  Constitutional: Negative for chills, fatigue and fever.  HENT: Positive for ear  pain. Negative for congestion, postnasal drip, rhinorrhea, sinus pain, sinus pressure and sore throat.   Eyes: Negative for photophobia and visual disturbance.  Respiratory: Negative for cough, shortness of breath and wheezing.   Cardiovascular: Negative for chest pain and palpitations.  Gastrointestinal: Positive for nausea. Negative for abdominal pain, constipation, diarrhea and vomiting.  Genitourinary: Negative for dysuria.  Musculoskeletal: Negative for myalgias.  Skin: Negative for rash.  Neurological: Positive for dizziness. Negative for speech difficulty, weakness, light-headedness, numbness and headaches.  Psychiatric/Behavioral:       Denies depressed mood. History of anxiety    Past Medical History:  Diagnosis Date  . Anxiety    Panic attacks  . Cervical dysplasia   . GERD (gastroesophageal reflux disease)   . Obesity   . UTI (urinary tract infection)    recurrent  . Vaginal yeast infection    recurrent     Social History   Social History  . Marital status: Married    Spouse name: N/A  . Number of children: 3  . Years of education: 15   Occupational History  . Engineer, manufacturingtudent     Elementary Education - [employer]   Social History Main Topics  . Smoking status: Never Smoker  . Smokeless tobacco: Never Used  . Alcohol use No  . Drug use: No  . Sexual activity: Yes    Birth control/ protection: Surgical     Comment: TUBAL LIGATION   Other Topics Concern  . Not on file   Social History Narrative  Fun: Going to [employer]    Past Surgical History:  Procedure Laterality Date  . CESAREAN SECTION  1610,[id number]   11-28-11  . COLPOSCOPY    . TUBAL LIGATION     2013 C-SECTION    Family History  Problem Relation Age of Onset  . Early death Mother   . Heart disease Maternal Grandmother   . Diabetes Maternal Grandmother   . Cancer Maternal Grandmother     Lymphoma  . Hypertension Paternal Grandmother   . Stroke Paternal Grandmother   . Hypertension Paternal  Grandfather   . Anesthesia problems Neg Hx   . Hypotension Neg Hx   . Malignant hyperthermia Neg Hx   . Pseudochol deficiency Neg Hx     Allergies  Allergen Reactions  . Sumatriptan Shortness Of Breath    Tight chest  . Levaquin [Levofloxacin In D5w] Nausea Only  . Nitrofurantoin Monohyd Macro Hives and Nausea And Vomiting    Current Outpatient Prescriptions on File Prior to Visit  Medication Sig Dispense Refill  . escitalopram (LEXAPRO) 10 MG tablet Take 1 tablet (10 mg total) by mouth daily. (Patient not taking: Reported on 12/04/2016) 90 tablet 3  . ibuprofen (ADVIL,MOTRIN) 200 MG tablet Take 3 tablets (600 mg total) by mouth every 6 (six) hours as needed for fever or mild pain. 30 tablet 1  . meclizine (ANTIVERT) 25 MG tablet Take 1 tablet (25 mg total) by mouth 3 (three) times daily as needed for dizziness. 30 tablet 0  . ranitidine (ZANTAC) 150 MG tablet Take 150 mg by mouth daily as needed for heartburn.     No current facility-administered medications on file prior to visit.     BP 122/88 (BP Location: Left Arm, Patient Position: Sitting, Cuff Size: Large)   Pulse 72   Temp 98.6 F (37 C) (Oral)   Wt 255 lb 6.4 oz (115.8 kg)   LMP 11/28/2016 (Exact Date)   SpO2 98%   BMI 43.84 kg/m       Objective:   Physical Exam  Constitutional: She is oriented to person, place, and time. She appears well-developed and well-nourished.  HENT:  Right Ear: Tympanic membrane is erythematous and bulging.  Left Ear: Tympanic membrane normal.  Nose: No rhinorrhea. Right sinus exhibits no maxillary sinus tenderness and no frontal sinus tenderness. Left sinus exhibits no maxillary sinus tenderness and no frontal sinus tenderness.  Mouth/Throat: Oropharynx is clear and moist and mucous membranes are normal. No oropharyngeal exudate or posterior oropharyngeal erythema.  Eyes: Pupils are equal, round, and reactive to light. No scleral icterus.  Neck: Neck supple.  Cardiovascular: Normal  rate, regular rhythm and intact distal pulses.   Pulmonary/Chest: Effort normal and breath sounds normal. She has no wheezes. She has no rales.  Abdominal: Soft. Bowel sounds are normal. There is no tenderness.  Lymphadenopathy:    She has no cervical adenopathy.  Neurological: She is alert and oriented to person, place, and time. She has normal strength. No sensory deficit. Gait normal.  II-Visual fields grossly intact. III/IV/VI-Extraocular movements intact. Pupils reactive bilaterally. V/VII-Smile symmetric, equal eyebrow raise, facial sensation intact VIII- Hearing grossly intact XI-bilateral shoulder shrug Motor: 5/5 bilaterally with normal tone and bulk Cerebellar:  Romberg negative Ambulates with a coordinated gait   Skin: Skin is warm and dry. No rash noted.  Psychiatric: She has a normal mood and affect. Her behavior is normal. Judgment and thought content normal.         Assessment & Plan:  1.  Right serous otitis media, unspecified chronicity Bulging TM with ear pain and pressure; will treat with amoxicillin; follow up if symptoms do not improve with treatment, worsen, or she develops a fever >100. - amoxicillin (AMOXIL) 500 MG capsule; Take 1 capsule (500 mg total) by mouth 3 (three) times daily.  Dispense: 30 capsule; Refill: 0  2. Dizziness Exam is reassuring; prior history of vertigo which have been present for >1 month and as long as 5 months per patient report that are intermittent in nature is reassuring. Prior recent evaluations including CT of head, EKG, and lab work that was unremarkable.  EKG today noted NRS. Recent onset of ear pain and pressure may be contributing to her symptoms. History of anxiety may also be a factor in symptoms.   - EKG 12-Lead EKG Interpretation  Date/Time:12/04/16 16:33 Ventricular Rate: 74 PR Interval: 170 msec QRS Duration: 104 msec QT Interval: 378 msec Text Interpretation: NSR  Review of EKG with Dr. Amador Cunas who agreed  with interpretation.  Advised follow up with her PCP in one week or sooner if symptoms do not improve with treatment, worsen, or she develops new symptoms, or fever >101. Further imaging such as a MRI may be needed. Advised her to follow up with PCP to discuss long term management with counseling such as medication and counseling as she has decided to not take Lexapro at this time.  Roddie Mc, FNP-C

## 2016-12-04 NOTE — Assessment & Plan Note (Signed)
I suspect likely URI related, prob viral now improving it seems with temp last PM, none today, flu negative,  For cxr due to cough, to f/u any worsening symptoms or concerns

## 2016-12-29 ENCOUNTER — Ambulatory Visit: Payer: BLUE CROSS/BLUE SHIELD | Admitting: Internal Medicine

## 2017-01-03 ENCOUNTER — Ambulatory Visit: Payer: BLUE CROSS/BLUE SHIELD | Admitting: Internal Medicine

## 2017-01-11 ENCOUNTER — Other Ambulatory Visit: Payer: Self-pay | Admitting: Women's Health

## 2017-01-11 ENCOUNTER — Encounter: Payer: Self-pay | Admitting: Women's Health

## 2017-01-11 ENCOUNTER — Ambulatory Visit (INDEPENDENT_AMBULATORY_CARE_PROVIDER_SITE_OTHER): Payer: BLUE CROSS/BLUE SHIELD | Admitting: Women's Health

## 2017-01-11 DIAGNOSIS — N898 Other specified noninflammatory disorders of vagina: Secondary | ICD-10-CM | POA: Diagnosis not present

## 2017-01-11 DIAGNOSIS — R300 Dysuria: Secondary | ICD-10-CM

## 2017-01-11 DIAGNOSIS — N3001 Acute cystitis with hematuria: Secondary | ICD-10-CM | POA: Diagnosis not present

## 2017-01-11 LAB — WET PREP FOR TRICH, YEAST, CLUE
Clue Cells Wet Prep HPF POC: NONE SEEN
Trich, Wet Prep: NONE SEEN
WBC, Wet Prep HPF POC: NONE SEEN
Yeast Wet Prep HPF POC: NONE SEEN

## 2017-01-11 LAB — URINALYSIS W MICROSCOPIC + REFLEX CULTURE
Bilirubin Urine: NEGATIVE
Casts: NONE SEEN [LPF]
Crystals: NONE SEEN [HPF]
Glucose, UA: NEGATIVE
Leukocytes, UA: NEGATIVE
Nitrite: NEGATIVE
Specific Gravity, Urine: 1.025 (ref 1.001–1.035)
Yeast: NONE SEEN [HPF]
pH: 7 (ref 5.0–8.0)

## 2017-01-11 MED ORDER — SULFAMETHOXAZOLE-TRIMETHOPRIM 800-160 MG PO TABS: 1.0000 | ORAL_TABLET | Freq: Two times a day (BID) | ORAL | 0 refills | Status: DC

## 2017-01-11 NOTE — Patient Instructions (Signed)

## 2017-01-11 NOTE — Progress Notes (Signed)
Presents with complaint of increased urinary frequency with urgency, mild burning, symptoms of early/starting UTI . Denies abdominal pain, back pain, pain at end of stream of urination. History of recurrent UTIs and yeast in the past. Having mild external vaginal itching, minimal  discharge. Monthly cycle/BTL.  Exam: Appears well. No CVAT. Abdomen soft, obese, external genitalia within normal limits minimal erythema, speculum exam scant discharge wet prep negative. Bimanual limited due to abdominal girth, UA: Trace ketones, +1 blood, trace protein, negative leukocytes, 0-5 WBCs, 10-20 rbc's, many bacteria  Probable UTI  Plan: Urine culture pending, aware of UTI prevention. Septra twice daily for 3 days. Instructed to call if symptoms persist. Will check test of cure UA, resolution of hematuria at annual exam in 2 weeks.

## 2017-01-13 LAB — URINE CULTURE

## 2017-01-14 ENCOUNTER — Encounter: Payer: Self-pay | Admitting: Women's Health

## 2017-01-16 ENCOUNTER — Encounter: Payer: Self-pay | Admitting: Adult Health

## 2017-01-16 ENCOUNTER — Ambulatory Visit (INDEPENDENT_AMBULATORY_CARE_PROVIDER_SITE_OTHER): Payer: BLUE CROSS/BLUE SHIELD | Admitting: Adult Health

## 2017-01-16 VITALS — BP 132/76 | Temp 97.7°F | Ht 64.0 in | Wt 256.6 lb

## 2017-01-16 DIAGNOSIS — R509 Fever, unspecified: Secondary | ICD-10-CM

## 2017-01-16 LAB — POCT INFLUENZA A/B
Influenza A, POC: NEGATIVE
Influenza B, POC: NEGATIVE

## 2017-01-16 NOTE — Progress Notes (Signed)
Subjective:    Patient ID: Julie Mcneil, female    DOB: Nov 13, 1980, 36 y.o.   MRN: [id number]  HPI  36 year old female who  has a past medical history of Anxiety; Cervical dysplasia; GERD (gastroesophageal reflux disease); Obesity; UTI (urinary tract infection); and Vaginal yeast infection. She presents to the office today for low grade fever and sinus pressure that started today. She reports that her children were diagnosed with the flu recently. She denies cough, sob vomiting, or diarrhea. She does report nausea   She started bactrim for a UTI today    Review of Systems See HPI   Past Medical History:  Diagnosis Date  . Anxiety    Panic attacks  . Cervical dysplasia   . GERD (gastroesophageal reflux disease)   . Obesity   . UTI (urinary tract infection)    recurrent  . Vaginal yeast infection    recurrent    Social History   Social History  . Marital status: Married    Spouse name: N/A  . Number of children: 3  . Years of education: 15   Occupational History  . 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 Smoking status: Never Smoker  . Smokeless tobacco: Never Used  . Alcohol use No  . Drug use: No  . Sexual activity: Yes    Birth control/ protection: Surgical     Comment: TUBAL LIGATION   Other Topics Concern  . Not on file   Social History Narrative   Fun: Going to [employer]    Past Surgical History:  Procedure Laterality Date  . CESAREAN SECTION  [id number]   11-28-11  . COLPOSCOPY    . TUBAL LIGATION     2013 C-SECTION    Family History  Problem Relation Age of Onset  . Early death Mother   . Heart disease Maternal Grandmother   . Diabetes Maternal Grandmother   . Cancer Maternal Grandmother     Lymphoma  . Hypertension Paternal Grandmother   . Stroke Paternal Grandmother   . Hypertension Paternal Grandfather   . Anesthesia problems Neg Hx   . Hypotension Neg Hx   . Malignant hyperthermia Neg Hx    . Pseudochol deficiency Neg Hx     Allergies  Allergen Reactions  . Sumatriptan Shortness Of Breath    Tight chest  . Levaquin [Levofloxacin In D5w] Nausea Only  . Nitrofurantoin Monohyd Macro Hives and Nausea And Vomiting    Current Outpatient Prescriptions on File Prior to Visit  Medication Sig Dispense Refill  . ibuprofen (ADVIL,MOTRIN) 200 MG tablet Take 200 mg by mouth every 6 (six) hours as needed.    . sulfamethoxazole-trimethoprim (BACTRIM DS) 800-160 MG tablet Take 1 tablet by mouth 2 (two) times daily. 6 tablet 0   No current facility-administered medications on file prior to visit.     LMP 12/24/2016       Objective:   Physical Exam  Constitutional: She is oriented to person, place, and time. She appears well-developed and well-nourished. No distress.  HENT:  Head: Normocephalic and atraumatic.  Right Ear: Hearing, tympanic membrane, external ear and ear canal normal.  Left Ear: Hearing, tympanic membrane, external ear and ear canal normal.  Nose: Nose normal. No mucosal edema or rhinorrhea. Right sinus exhibits no maxillary sinus tenderness and no frontal sinus tenderness. Left sinus exhibits no maxillary sinus tenderness and no frontal sinus tenderness.  Mouth/Throat: Uvula is  midline and oropharynx is clear and moist. No oropharyngeal exudate.  Eyes: Conjunctivae and EOM are normal. Pupils are equal, round, and reactive to light. Right eye exhibits no discharge. Left eye exhibits no discharge. No scleral icterus.  Neck: Normal range of motion. Neck supple. No thyromegaly present.  Cardiovascular: Normal rate, regular rhythm, normal heart sounds and intact distal pulses.  Exam reveals no gallop and no friction rub.   No murmur heard. Pulmonary/Chest: Effort normal and breath sounds normal. No respiratory distress. She has no wheezes. She has no rales. She exhibits no tenderness.  Lymphadenopathy:    She has no cervical adenopathy.  Neurological: She is alert and  oriented to person, place, and time.  Skin: Skin is warm and dry. No rash noted. She is not diaphoretic. No erythema. No pallor.  Psychiatric: She has a normal mood and affect. Her behavior is normal. Judgment and thought content normal.  Nursing note and vitals reviewed.     Assessment & Plan:  1. Fever, unspecified fever cause - Negative exam  - likely source is UTI  - POC Influenza A/B - Stay hydrated and rest - Can take Motrin/Tylnenol as needed  Shirline Freesory Davina Howlett, NP

## 2017-01-16 NOTE — Patient Instructions (Signed)
Your flu swab was negative.   You should start to feel better after 24-36 hours of taking Bactrim.   You can take tylenol if needed  Stay hydrated   Follow up with your PCP if needed

## 2017-01-24 ENCOUNTER — Encounter: Payer: BLUE CROSS/BLUE SHIELD | Admitting: Women's Health

## 2017-01-25 ENCOUNTER — Encounter: Payer: Self-pay | Admitting: Neurology

## 2017-01-25 ENCOUNTER — Ambulatory Visit (INDEPENDENT_AMBULATORY_CARE_PROVIDER_SITE_OTHER): Payer: BLUE CROSS/BLUE SHIELD | Admitting: Neurology

## 2017-01-25 VITALS — BP 141/96 | HR 86 | Resp 20 | Ht 64.0 in | Wt 250.0 lb

## 2017-01-25 DIAGNOSIS — R519 Headache, unspecified: Secondary | ICD-10-CM

## 2017-01-25 DIAGNOSIS — H93A2 Pulsatile tinnitus, left ear: Secondary | ICD-10-CM

## 2017-01-25 DIAGNOSIS — H81313 Aural vertigo, bilateral: Secondary | ICD-10-CM

## 2017-01-25 DIAGNOSIS — E669 Obesity, unspecified: Secondary | ICD-10-CM

## 2017-01-25 DIAGNOSIS — R51 Headache: Secondary | ICD-10-CM

## 2017-01-25 MED ORDER — DIAZEPAM 5 MG PO TABS: 5.0000 mg | ORAL_TABLET | Freq: Four times a day (QID) | ORAL | 0 refills | Status: DC | PRN

## 2017-01-25 NOTE — Progress Notes (Signed)
Provider:  Melvyn Novas, M D  Referring Provider: Dr Angelica Pou   Primary Care Physician:  Oliver Barre, MD  Chief Complaint  Patient presents with  . New Patient (Initial Visit)    dizziness and vertgio    HPI:  Julie Mcneil is a 36 y.o. female , seen here as a referral from her ophthalmologist Dr Hazle Quant for an evaluation of headaches and dizziness.  Onset of dizziness since January. Chief complaint according to patient :" I cannot live with this dizziness anymore."  Julie Mcneil disc at that she has seen her ophthalmologist to make sure that she doesn't suffer from high intracranial or ocular pressure, her eye workup was completely within normal limits, she has normal color vision, good visual acuity, only the  Fundi were a little blurred at the disc margin nasally. . The optometrist mentioned that there was an indistinct margin nasally at the left and right optic nerve papilla. For this reason I will see the patient today in a neurologic consultation. She has seen ENT , with normal  exam and audiology.  Her headaches have gotten better, they respond to ibuprofen which she takes daily. Vertigo is her main concern today and she states that the dizziness is a life quality limiting perception of feeling of falling, fear of passing out and these are highly anxious. She describes an associated tinnitus. She gave a very impressive description of her tinnitus describing that "her ears are screaming "when she has a vertigo attack. Vertigo headache and tinnitus onset were closely related. The patient is morbidly obese. She has other concerns , too. Such as a thrush on her throat, and she reports that her vertigo is worse at night before she goes back to retreat. The vertigo becomes more intense towards the evening hours. She often retreats to bed as early as 7 PM , she hasn't cooked in an weeks because she cannot bend at the stove. The dizziness is also causing nausea and even culminated in vomiting.   The patient's family encompasses her husband and 3 children.  Chief complaint according to patient :" I cannot live with this dizziness anymore." Her tinnitus get worse when she is in bed, she struggles to find a comfortable position. The dizziness is not affected. She has lost appetite and weight.   Social history: The patient is a nonsmoker, nondrinker, limited caffeine user-  in AM, 1 coke in the morning. 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 Married, 3 children.   Review of Systems: Out of a complete 14 system review, the patient complains of only the following symptoms, and all other reviewed systems are negative.  Dizziness, she feels is if she is on a boat in heavy sea. She feels she is in motion, not the room around her.    Social History   Social History  . Marital status: Married    Spouse name: N/A  . Number of children: 3  . Years of education: 15   Occupational History  . 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 Smoking status: Never Smoker  . Smokeless tobacco: Never Used  . Alcohol use No  . Drug use: No  . Sexual activity: Yes    Birth control/ protection: Surgical     Comment: TUBAL LIGATION   Other Topics Concern  . Not on file   Social  History Narrative   Fun: Going to [employer]    Family History  Problem Relation Age of Onset  . Early death Mother   . Heart disease Maternal Grandmother   . Diabetes Maternal Grandmother   . Cancer Maternal Grandmother     Lymphoma  . Hypertension Paternal Grandmother   . Stroke Paternal Grandmother   . Hypertension Paternal Grandfather   . Anesthesia problems Neg Hx   . Hypotension Neg Hx   . Malignant hyperthermia Neg Hx   . Pseudochol deficiency Neg Hx     Past Medical History:  Diagnosis Date  . Anxiety    Panic attacks  . Cervical dysplasia   . GERD (gastroesophageal reflux disease)   .  Obesity   . UTI (urinary tract infection)    recurrent  . Vaginal yeast infection    recurrent    Past Surgical History:  Procedure Laterality Date  . CESAREAN SECTION  [id number],2011   11-28-11  . COLPOSCOPY    . TUBAL LIGATION     2013 C-SECTION    Current Outpatient Prescriptions  Medication Sig Dispense Refill  . ibuprofen (ADVIL,MOTRIN) 200 MG tablet Take 200 mg by mouth every 6 (six) hours as needed.    . meclizine (ANTIVERT) 12.5 MG tablet Take 12.5 mg by mouth 3 (three) times daily as needed for dizziness.     No current facility-administered medications for this visit.     Allergies as of 01/25/2017 - Review Complete 01/25/2017  Allergen Reaction Noted  . Sumatriptan Shortness Of Breath 06/29/2015  . Levaquin [levofloxacin in d5w] Nausea Only 12/02/2015  . Nitrofurantoin monohyd macro Hives and Nausea And Vomiting 04/10/2011    Vitals: BP (!) 141/96   Pulse 86   Resp 20   Ht 5\' 4"  (1.626 m)   Wt 250 lb (113.4 kg)   BMI 42.91 kg/m  Last Weight:  Wt Readings from Last 1 Encounters:  01/25/17 250 lb (113.4 kg)   VHQ:IONGBMI:Body mass index is 42.91 kg/m.     Last Height:   Ht Readings from Last 1 Encounters:  01/25/17 5\' 4"  (1.626 m)    Physical exam:  General: The patient is awake, alert and appears in acute distress. The patient is well groomed. She is unable to move quickly. She avoids turns.  Head: Normocephalic, atraumatic. Neck is supple. Mallampati 3  neck circumference: 17.5 . Nasal airflow patent , TMJ is not evident . Retrognathia is seen.  Cardiovascular:  Regular rate and rhythm, without  murmurs or carotid bruit, and without distended neck veins. Respiratory: Lungs are clear to auscultation. Skin:  Without evidence of edema, or rash Trunk: BMI is 43 . The patient's posture is erect.  Neurologic exam : The patient is awake and alert, oriented to place and time.    Attention span & concentration ability appears normal.  Speech is fluent,  Without  dysarthria, dysphonia or aphasia.  Mood and affect are anxious  Cranial nerves: Pupils are equal and briskly reactive to light. Funduscopic exam without  evidence of pallor , there is a small area of ill defined optic nerve papillae. ? edema. Extraocular movements  in vertical and horizontal planes intact and without nystagmus.  Visual fields by finger perimetry are intact. Rapid head movement did to cause end point nystagmus, but it was not lateralized to left or right. She felt dizzy enough to hold onto an object. She describes tinnitus as of 4 and sometimes as a pulsation. Hearing to finger rub intact.  Facial sensation intact to fine touch.  Facial motor strength is symmetric and tongue and uvula move midline. Shoulder shrug was symmetrical.   Motor exam: Normal tone, muscle bulk and symmetric strength in all extremities. Sensory:  Fine touch, pinprick and vibration were tested in all extremities. Proprioception tested in the upper extremities was normal. Coordination: Rapid alternating movements in the fingers/hands was normal. Finger-to-nose maneuver  normal without evidence of ataxia, dysmetria or tremor. Gait and station: Patient walks without assistive device and is able unassisted to climb up to the exam table. Strength within normal limits.  Stance is stable and normal. Toe and heel stand were tested. Tandem gait is fragmented. Turns with 4 Steps, holding on- very dizzy but no preferred direction.. Romberg testing is negative but patient reports feeling as if swaying. Marland Kitchen Deep tendon reflexes: in the  upper and lower extremities are symmetric and intact. Babinski maneuver response is downgoing.  The patient was advised of the nature of the diagnosed sleep disorder , the treatment options and risks for general a health and wellness arising from not treating the condition.  I spent more than 45 minutes of face to face time with the patient. Greater than 50% of time was spent in counseling  and coordination of care. We have discussed the diagnosis and differential and I answered the patient's questions.     Assessment:  After physical and neurologic examination, review of laboratory studies,  Personal review of imaging studies, reports of other /same  Imaging studies ,  Results of polysomnography/ neurophysiology testing and pre-existing records as far as provided in visit., my assessment is   1) Evaluation for pseudotumor cerebri: super obese, headaches, tinnitus and nausea. Minor area of puffiness in the nasal optic disc. Need brain MRI . She also reports numbness in her shoulders radiating to arms and hands. Rule out Arnold-Chiari.  2) pulsatile tinnitus was not related to carotid bruit or palpable heart pulses. The patient is slightly tachycardic which could be anxiety related and I also measured her heart rate after the Brandt-Daroff maneuver/.  3) patient  reports that her dizziness does not get worse when she lays down only when she rapidly moves her head or when she is exposed to multiple visual stimuli. She  says if she falls when looking down. She also can't look up either. She can climb stairs but has trouble descending.  I think we need to evaluate her for posterior vestibular dysfunction, vertebrobasilar flow.     Plan:  Treatment plan and additional workup :  vestibular rehab, MRI and doppler emboli monitoring, Valium for anxiety and Vertigo.  If MRI normal, consider LP with opening pressure.    Porfirio Mylar Yuchen Fedor MD  01/25/2017   CC: Dr. Sol Blazing and Dr Jimmey Ralph, OD

## 2017-01-25 NOTE — Patient Instructions (Signed)
Labyrinthitis Labyrinthitis is an infection of the inner ear. Your inner ear is a system of tubes and canals (labyrinth). These are filled with fluid. Nerve cells in your inner ear send signals for hearing and balance to your brain. When tiny germs get inside the tubes and canals, they harm the cells that send messages to the brain. This can cause changes in hearing and balance. Follow these instructions at home:  Take medicines only as told by your doctor.  If you were prescribed an antibiotic medicine, finish all of it even if you start to feel better.  Rest as much as possible.  Avoid loud noises and bright lights.  Do not make sudden movements until any dizziness goes away.  Do not drive until your doctor says that you can.  Drink enough fluid to keep your pee (urine) clear or pale yellow.  Work with a physical therapist if you still feel dizzy after several weeks. A therapist can teach you exercises to help you deal with your dizziness.  Keep all follow-up visits as told by your doctor. This is important. Contact a doctor if:  Your symptoms do not get better with medicines.  You do not get better after two weeks.  You have a fever. Get help right away if:  You are very dizzy.  You keep throwing up (vomiting) or keep feeling sick to your stomach (nauseous).  Your hearing gets a lot worse very quickly. This information is not intended to replace advice given to you by your health care provider. Make sure you discuss any questions you have with your health care provider. Document Released: 10/23/2005 Document Revised: 03/30/2016 Document Reviewed: 08/04/2014 Elsevier Interactive Patient Education  2017 Elsevier Inc.  

## 2017-01-29 ENCOUNTER — Telehealth: Payer: Self-pay | Admitting: Neurology

## 2017-01-29 NOTE — Telephone Encounter (Signed)
I called pt and advised her of this information. Pt still has many questions. She wants to know if there is any other medication she can take to stop the dizziness. She has already tried and failed meclizine and valium. She wants to know, since she works from 4pm-11pm, when she should take the valium?(I did tell her to take it when she got home from work, but she says that she needs something to help her while she is at work.)   I advised her that either I or Dr. Vickey Hugerohmeier will call her back.

## 2017-01-29 NOTE — Telephone Encounter (Signed)
Patient called office in reference to diazepam (VALIUM) 5 MG tablet.  Patient states the medication is not working and making her more dizzy.  Patient is taking it at work stands on feet while working.  Please call

## 2017-01-29 NOTE — Telephone Encounter (Signed)
I called pt. She is taking the valium 5mg  1/2 of a tablet every 6 hours. It has helped her anxiety, however, her dizziness is worse while taking the valium. She has been helped at work several times to sit down because her dizziness has gotten so bad. She tried taking a whole tablet, but that made it even more worse. Pt reports that she does have her physical therapy appt on Wednesday that Dr. Vickey Hugerohmeier recommended.   I advised her that either I or Dr. Vickey Hugerohmeier will call her back to discuss a possible medication change. Pt verbalized understanding.

## 2017-01-29 NOTE — Telephone Encounter (Signed)
I am surprised at the medication increasing her dizziness. Let's stop valium in day time and use it only at night if needed . She shall not use Valium the day of vestibular rehab visit as it covers signs and symptoms.  I need to await her other tests results.

## 2017-01-29 NOTE — Telephone Encounter (Signed)
Her employer is working with her, the valium intake caused her to feel as if she was in motion. The worst dizziness is caused by looking downwards.  I answered her questions, but have no more medication to offer. I will await the results of pending tests CD

## 2017-01-31 ENCOUNTER — Ambulatory Visit: Payer: BLUE CROSS/BLUE SHIELD | Attending: Neurology | Admitting: Physical Therapy

## 2017-01-31 ENCOUNTER — Encounter: Payer: Self-pay | Admitting: Physical Therapy

## 2017-01-31 DIAGNOSIS — R2681 Unsteadiness on feet: Secondary | ICD-10-CM | POA: Diagnosis present

## 2017-01-31 DIAGNOSIS — R42 Dizziness and giddiness: Secondary | ICD-10-CM

## 2017-01-31 DIAGNOSIS — R262 Difficulty in walking, not elsewhere classified: Secondary | ICD-10-CM | POA: Diagnosis present

## 2017-01-31 NOTE — Therapy (Signed)
[facility] [address] [place], [place], [address] Phone: [phone]   Fax:  [phone]  Physical Therapy Evaluation  Patient Details  Name: Julie Mcneil MRN: [id number] Date of Birth: 10/14/1981 Referring Provider: Melvyn Novasarmen Dohmeier, MD  Encounter Date: 01/31/2017      PT End of Session - 01/31/17 2004    Visit Number 1   Number of Visits 9   Date for PT Re-Evaluation 04/01/17   Authorization Type BCBS    PT Start Time 0932   PT Stop Time 1017   PT Time Calculation (min) 45 min   Activity Tolerance Patient tolerated treatment well   Behavior During Therapy Anxious      Past Medical History:  Diagnosis Date  . Anxiety    Panic attacks  . Cervical dysplasia   . GERD (gastroesophageal reflux disease)   . Obesity   . UTI (urinary tract infection)    recurrent  . Vaginal yeast infection    recurrent    Past Surgical History:  Procedure Laterality Date  . CESAREAN SECTION  [id number],2011   11-28-11  . COLPOSCOPY    . TUBAL LIGATION     2013 C-SECTION    There were no vitals filed for this visit.       Subjective Assessment - 01/31/17 0937    Subjective Pt presents to OPPT with intermittent acute episodes of vertigo; first episode was Halloween of 2017-describes it as feeling off balance.  Is progressively getting worse and more frequent; now is having episodes daily.  Has had intermittent ear infections.  Has seen ENT; still has pressure in ear.  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  Symptoms resolve when sitting and no symptoms when driving.  Does notice symptoms when looking at busy patterns on walls and on clothing.  Is now combining Meclizine and Valium at night when needed to tolerate work.  Did not see change in symptoms when taking individually. 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  Pt feels she had changes in hearing-ENT states no evident hearing loss.  Does report tinnitus and headaches.  Has had one episode of nausea and vomiting but may be unrelated to dizziness.  No changes in vision.  Does report some dizziness when bearing down to have BM.   Pertinent History obesity, R neck pain with radiculopathy, h/o chest pain, L knee pain   Limitations Walking;Standing;House hold activities   Patient Stated Goals To not feel dizzy anymore   Currently in Pain? 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  Yes   Is the patient reluctant to leave their home because of a fear of falling?  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     Observation/Other Assessments   Focus on Therapeutic Outcomes (FOTO)  not completed     Sensation   Light Touch Not tested  reports numbness in UE when in bed; sleeping position             Vestibular Assessment - 01/31/17 0953      Vestibular Assessment   General Observation guarded, anxious     Symptom Behavior   Type of Dizziness Imbalance   Frequency of Dizziness daily   Duration of Dizziness hours   Aggravating Factors Spontaneous onset;Looking up to the ceiling;Turning head quickly;Turning body quickly;Comment;Forward bending  incline in shower, busy patterns, valsalva   Relieving Factors Medication;Comments;Closing eyes  sitting down, riding in car     Occulomotor Exam   Occulomotor Alignment Normal   Spontaneous Absent   Gaze-induced Absent   Head shaking Horizontal Absent   Smooth Pursuits Intact   Saccades Slow   Comment Convergence impaired     Vestibulo-Occular Reflex   VOR to Slow Head Movement Normal   VOR Cancellation Normal   Comment HIT: + bilaterally     Visual Acuity   Static 10   Dynamic 5     Other Tests   Tragal negative   Comments Valsalva with frenzels on: negative nystagmus but + for dizziness     Positional Testing   Dix-Hallpike Dix-Hallpike Right;Dix-Hallpike Left   Horizontal Canal Testing Horizontal Canal Right;Horizontal Canal Left     Dix-Hallpike Right   Dix-Hallpike Right Duration 0   Dix-Hallpike Right Symptoms No nystagmus     Dix-Hallpike Left   Dix-Hallpike Left Duration 0   Dix-Hallpike Left Symptoms No nystagmus     Horizontal Canal Right   Horizontal Canal Right Duration 0   Horizontal Canal Right Symptoms Normal     Horizontal Canal Left   Horizontal Canal Left Duration 0   Horizontal Canal Left Symptoms Normal             PT Education - 01/31/17 2004    Education provided Yes   Education Details clinical findings, PT POC and goals   Person(s) Educated Patient   Methods Explanation   Comprehension Verbalized understanding             PT Long Term Goals - 01/31/17 2016      PT LONG TERM GOAL #1   Title (TARGET DATE FOR ALL LTG 03/02/2017)  Pt will be independent with vestibular and  balance HEP   Status New     PT LONG TERM GOAL #2   Title Pt will improve gaze stability as demonstrated by decrease in DVA to 3 line difference   Baseline 5 line difference    Status New     PT LONG TERM GOAL #3   Title FGA goal to be set   Baseline TBD     PT LONG TERM GOAL #4  Title SOT composite score goal to be set   Baseline TBD     PT LONG TERM GOAL #5   Title Pt will report 75% less symptoms of dysequilibrium while reaching high and low or looking at busy patterns to simulate regular work activities (not due to medication)   Status New               Plan - 01/31/17 1014    Clinical Impression Statement Pt is a 36 year old female presenting to OPPT neuro for low complexity PT evaluation for vertigo, tinnitus and headaches. Pt's PMH significant for the following: cervicalgia with R radicular symptoms, chest pain, L knee pain, obesity, and headaches. The following deficits were noted during pt's exam: dysequilibrium, bilateral vestibular hypofunction, impaired VOR and gaze stabilization, motion sensitivity, possible persistent postural perceptual dizziness (PPPD), impaired balance and difficulty with walking limiting patient's ability to perform household duties and work duties. Pt would benefit from skilled PT to address these impairments and functional limitations to maximize functional mobility independence and reduce falls risk   Rehab Potential Good   PT Frequency 2x / week   PT Duration 8 weeks  will reassess at 4   PT Treatment/Interventions ADLs/Self Care Home Management;Canalith Repostioning;Gait training;Stair training;Functional mobility training;Therapeutic activities;Therapeutic exercise;Balance training;Neuromuscular re-education;Patient/family education;Vestibular   PT Next Visit Plan assess SOT, FGA and set goals; perform MSQ-set HEP   PT Home Exercise Plan corrective saccades, x 1 viewing if pt able to tolerate   Consulted and Agree with Plan of Care  Patient      Patient will benefit from skilled therapeutic intervention in order to improve the following deficits and impairments:  Decreased balance, Difficulty walking, Dizziness  Visit Diagnosis: Dizziness and giddiness  Unsteadiness on feet  Difficulty in walking, not elsewhere classified     Problem List Patient Active Problem List   Diagnosis Date Noted  . Hyperglycemia 11/28/2016  . Fever 11/28/2016  . Generalized anxiety disorder 02/02/2016  . Acute upper respiratory infection 02/02/2016  . Dysuria 02/02/2016  . Acute sinus infection 11/22/2015  . Anxiety state 11/22/2015  . Fatigue 09/20/2015  . Diarrhea 08/25/2015  . Cervicogenic headache 06/14/2015  . Nonallopathic lesion of cervical region 06/14/2015  . Neck pain on right side 05/24/2015  . Routine general medical examination at a health care facility 02/15/2015  . Abnormal urine odor 12/28/2014  . Severe obesity (BMI >= 40) (HCC) 12/28/2014  . Flank pain 11/17/2014  . Headache 04/03/2014  . Heart palpitations 01/22/2014  . Obesity 03/21/2012    Edman Circle, PT, DPT 01/31/17    8:27 PM    [facility] [address] [place], [place], [address] Phone: [phone]   Fax:  [phone]  Name: BETHLEHEM LANGSTAFF MRN: [id number] Date of Birth: April 14, 1981

## 2017-02-05 ENCOUNTER — Encounter: Payer: BLUE CROSS/BLUE SHIELD | Admitting: Women's Health

## 2017-02-06 ENCOUNTER — Ambulatory Visit: Payer: BLUE CROSS/BLUE SHIELD | Attending: Neurology

## 2017-02-06 DIAGNOSIS — R262 Difficulty in walking, not elsewhere classified: Secondary | ICD-10-CM | POA: Diagnosis present

## 2017-02-06 DIAGNOSIS — R2681 Unsteadiness on feet: Secondary | ICD-10-CM

## 2017-02-06 DIAGNOSIS — R42 Dizziness and giddiness: Secondary | ICD-10-CM | POA: Diagnosis present

## 2017-02-06 NOTE — Therapy (Signed)
[facility] [address] [place], [place], [address] Phone: [phone]   Fax:  [phone]  Physical Therapy Treatment  Patient Details  Name: Julie Mcneil MRN: [id number] Date of Birth: 11-May-1981 Referring Provider: Melvyn Novas, MD  Encounter Date: 02/06/2017      PT End of Session - 02/06/17 0928    Visit Number 2   Number of Visits 9   Date for PT Re-Evaluation 04/01/17   Authorization Type BCBS    PT Start Time [phone]  pt arrived late   PT Stop Time 0927   PT Time Calculation (min) 30 min   Equipment Utilized During Treatment --  SOT harness and min guard during FGA prn   Activity Tolerance Patient tolerated treatment well   Behavior During Therapy Anxious  pt demonstrated some anxiety-like behaviors during SOT.      Past Medical History:  Diagnosis Date  . Anxiety    Panic attacks  . Cervical dysplasia   . GERD (gastroesophageal reflux disease)   . Obesity   . UTI (urinary tract infection)    recurrent  . Vaginal yeast infection    recurrent    Past Surgical History:  Procedure Laterality Date  . CESAREAN SECTION  [id number]   11-28-11  . COLPOSCOPY    . TUBAL LIGATION     2013 C-SECTION    There were no vitals filed for this visit.      Subjective Assessment - 02/06/17 0859    Subjective "I feel pretty good today." Pt went on vacation to the beach.    Pertinent History obesity, R neck pain with radiculopathy, h/o chest pain, L knee pain   Patient Stated Goals To not feel dizzy anymore   Currently in Pain? No/denies            [facility] PT Assessment - 02/06/17 0900      Functional Gait  Assessment   Gait assessed  Yes   Gait Level Surface Walks 20 ft in less than 7 sec but greater than 5.5 sec, uses assistive device, slower speed, mild gait deviations, or deviates 6-10 in outside of the 12 in walkway width.  6.7 sec.   Change in Gait Speed Able to change speed, demonstrates  mild gait deviations, deviates 6-10 in outside of the 12 in walkway width, or no gait deviations, unable to achieve a major change in velocity, or uses a change in velocity, or uses an assistive device.   Gait with Horizontal Head Turns Performs head turns smoothly with slight change in gait velocity (eg, minor disruption to smooth gait path), deviates 6-10 in outside 12 in walkway width, or uses an assistive device.   Gait with Vertical Head Turns Performs task with slight change in gait velocity (eg, minor disruption to smooth gait path), deviates 6 - 10 in outside 12 in walkway width or uses assistive device   Gait and Pivot Turn Pivot turns safely within 3 sec and stops quickly with no loss of balance.   Step Over Obstacle Is able to step over 2 stacked shoe boxes taped together (9 in total height) without changing gait speed. No evidence of imbalance.   Gait with Narrow Base of Support Is able to ambulate for 10 steps heel to toe with no staggering.   Gait with Eyes Closed Walks 20 ft, slow speed, abnormal gait pattern, evidence for imbalance, deviates 10-15 in outside 12 in walkway width. Requires more than 9 sec to ambulate 20  ft.  11.7sec.    Ambulating Backwards Walks 20 ft, uses assistive device, slower speed, mild gait deviations, deviates 6-10 in outside 12 in walkway width.   Steps Alternating feet, must use rail.  No rail to ascend but rail to descend   Total Score 22   FGA comment: pt reported unsteadiness during head turns and descending steps.         Neuro re-ed: sensory organization test performed with following results: Conditions: 1: WNL 2: WNL 3: one fall trial and 2 WNL  4: WNL 5: WNL 6: WNL Composite score: 75 WNL Sensory Analysis Som: WNL Vis: WNL Vest: WNL Pref: slight decreased ~73 Strategy analysis: Good use of hip/ankle strategy       COG alignment: Posterior bias to R side                            PT Education - 02/06/17 0928     Education provided Yes   Education Details PT provided pt with educationon FGA and SOT results.   Person(s) Educated Patient   Methods Explanation   Comprehension Verbalized understanding             PT Long Term Goals - 02/06/17 0930      PT LONG TERM GOAL #1   Title (TARGET DATE FOR ALL LTG 03/02/2017)  Pt will be independent with vestibular and balance HEP   Status New     PT LONG TERM GOAL #2   Title Pt will improve gaze stability as demonstrated by decrease in DVA to 3 line difference   Baseline 5 line difference    Status New     PT LONG TERM GOAL #3   Title FGA goal to be set   Baseline TBD   Status Achieved     PT LONG TERM GOAL #4   Title SOT composite score goal to be set   Baseline SOT WNL on 02/06/17   Status Deferred     PT LONG TERM GOAL #5   Title Pt will report 75% less symptoms of dysequilibrium while reaching high and low or looking at busy patterns to simulate regular work activities (not due to medication)   Status New     Additional Long Term Goals   Additional Long Term Goals Yes     PT LONG TERM GOAL #6   Title Pt will improve FGA score to 30/30 to decr. falls risk.    Baseline 22/30   Status New               Plan - 02/06/17 0929    Clinical Impression Statement Pt's SOT results WNL for her age group of 35y/o. Pt did experience one "failed" trial during condition 3, as she touched wall for support. Pt's FGA score indicates she is at a moderate risk for falls. Pt experienced reports of unsteadiness during conditions which required incr. vestibular input. Continue with POC.    Rehab Potential Good   PT Frequency 2x / week   PT Duration 8 weeks  will reassess at 4   PT Treatment/Interventions ADLs/Self Care Home Management;Canalith Repostioning;Gait training;Stair training;Functional mobility training;Therapeutic activities;Therapeutic exercise;Balance training;Neuromuscular re-education;Patient/family education;Vestibular   PT Next  Visit Plan Provided HEP. perform MSQ-set HEP   PT Home Exercise Plan corrective saccades, x 1 viewing if pt able to tolerate   Consulted and Agree with Plan of Care Patient      Patient will benefit from  skilled therapeutic intervention in order to improve the following deficits and impairments:  Decreased balance, Difficulty walking, Dizziness  Visit Diagnosis: Dizziness and giddiness  Unsteadiness on feet     Problem List Patient Active Problem List   Diagnosis Date Noted  . Hyperglycemia 11/28/2016  . Fever 11/28/2016  . Generalized anxiety disorder 02/02/2016  . Acute upper respiratory infection 02/02/2016  . Dysuria 02/02/2016  . Acute sinus infection 11/22/2015  . Anxiety state 11/22/2015  . Fatigue 09/20/2015  . Diarrhea 08/25/2015  . Cervicogenic headache 06/14/2015  . Nonallopathic lesion of cervical region 06/14/2015  . Neck pain on right side 05/24/2015  . Routine general medical examination at a health care facility 02/15/2015  . Abnormal urine odor 12/28/2014  . Severe obesity (BMI >= 40) (HCC) 12/28/2014  . Flank pain 11/17/2014  . Headache 04/03/2014  . Heart palpitations 01/22/2014  . Obesity 03/21/2012    Julie Mcneil L 02/06/2017, 9:31 AM  [facility] [address] [place], [place], [address] Phone: [phone]   Fax:  [phone]  Name: Julie Mcneil MRN: [id number] Date of Birth: 03-16-1981  Zerita Boers, PT,DPT 02/06/17 9:32 AM Phone: [phone] Fax: [phone]

## 2017-02-07 ENCOUNTER — Other Ambulatory Visit: Payer: Self-pay | Admitting: *Deleted

## 2017-02-07 ENCOUNTER — Other Ambulatory Visit: Payer: BLUE CROSS/BLUE SHIELD

## 2017-02-07 DIAGNOSIS — H93A2 Pulsatile tinnitus, left ear: Secondary | ICD-10-CM

## 2017-02-07 DIAGNOSIS — R519 Headache, unspecified: Secondary | ICD-10-CM

## 2017-02-07 DIAGNOSIS — R51 Headache: Secondary | ICD-10-CM

## 2017-02-07 DIAGNOSIS — E669 Obesity, unspecified: Secondary | ICD-10-CM

## 2017-02-07 DIAGNOSIS — H81313 Aural vertigo, bilateral: Secondary | ICD-10-CM

## 2017-02-08 ENCOUNTER — Ambulatory Visit
Admission: RE | Admit: 2017-02-08 | Discharge: 2017-02-08 | Disposition: A | Discharge status: Home / Self Care | Payer: BLUE CROSS/BLUE SHIELD | Source: Ambulatory Visit | Attending: Neurology | Admitting: Neurology

## 2017-02-08 DIAGNOSIS — H81313 Aural vertigo, bilateral: Secondary | ICD-10-CM | POA: Diagnosis not present

## 2017-02-08 DIAGNOSIS — R519 Headache, unspecified: Secondary | ICD-10-CM

## 2017-02-08 DIAGNOSIS — R51 Headache: Secondary | ICD-10-CM | POA: Diagnosis not present

## 2017-02-08 DIAGNOSIS — E669 Obesity, unspecified: Secondary | ICD-10-CM

## 2017-02-08 DIAGNOSIS — H93A2 Pulsatile tinnitus, left ear: Secondary | ICD-10-CM

## 2017-02-08 MED ORDER — GADOBENATE DIMEGLUMINE 529 MG/ML IV SOLN
20.0000 mL | Freq: Once | INTRAVENOUS | Status: AC | PRN
  Administered 2017-02-08: 20 mL via INTRAVENOUS

## 2017-02-09 ENCOUNTER — Ambulatory Visit: Payer: BLUE CROSS/BLUE SHIELD

## 2017-02-09 ENCOUNTER — Telehealth: Payer: Self-pay | Admitting: Neurology

## 2017-02-09 DIAGNOSIS — R2681 Unsteadiness on feet: Secondary | ICD-10-CM

## 2017-02-09 DIAGNOSIS — R42 Dizziness and giddiness: Secondary | ICD-10-CM | POA: Diagnosis not present

## 2017-02-09 NOTE — Telephone Encounter (Signed)
Pt request MRI results.  °

## 2017-02-09 NOTE — Therapy (Signed)
[facility] [address] [place], [place], [address] Phone: [phone]   Fax:  [phone]  Physical Therapy Treatment  Patient Details  Name: Julie Mcneil MRN: [id number] Date of Birth: 30-Jan-1981 Referring Provider: Melvyn Novas, MD  Encounter Date: 02/09/2017      PT End of Session - 02/09/17 1630    Visit Number 3   Number of Visits 9   Date for PT Re-Evaluation 04/01/17   Authorization Type BCBS    PT Start Time 1537   PT Stop Time 1615   PT Time Calculation (min) 38 min   Equipment Utilized During Treatment Gait belt   Activity Tolerance Patient tolerated treatment well   Behavior During Therapy WFL for tasks assessed/performed      Past Medical History:  Diagnosis Date  . Anxiety    Panic attacks  . Cervical dysplasia   . GERD (gastroesophageal reflux disease)   . Obesity   . UTI (urinary tract infection)    recurrent  . Vaginal yeast infection    recurrent    Past Surgical History:  Procedure Laterality Date  . CESAREAN SECTION  6962,9528   11-28-11  . COLPOSCOPY    . TUBAL LIGATION     2013 C-SECTION    There were no vitals filed for this visit.      Subjective Assessment - 02/09/17 1540    Subjective Pt reported she took a Valium this morning as she was unsteady. Pt had an MRI last night but does not have the results yet.    Pertinent History obesity, R neck pain with radiculopathy, h/o chest pain, L knee pain   Patient Stated Goals To not feel dizzy anymore   Currently in Pain? Yes   Pain Score 3    Pain Location Head   Pain Orientation Right   Pain Descriptors / Indicators Headache  "right in my eyeball"   Pain Type Acute pain   Pain Onset Today   Pain Frequency Constant   Aggravating Factors  nothing   Pain Relieving Factors normally motrin helps but has not relieved pain today                         [facility] Adult PT Treatment/Exercise - 02/09/17 1628       High Level Balance   High Level Balance Activities Head turns  head nods and amb. with eyes closed   High Level Balance Comments Performed in // bars with S for safety. Cues for technique.         Vestibular Treatment/Exercise - 02/09/17 1629      Vestibular Treatment/Exercise   Vestibular Treatment Provided Gaze   Gaze Exercises X1 Viewing Horizontal;X1 Viewing Vertical     X1 Viewing Horizontal   Foot Position feet apart   Time --  20-30   Reps 3   Comments Cues and demo for technique. Please see pt instructions for HEP details. S for safety.     X1 Viewing Vertical   Foot Position feet apart   Time --  20-30   Reps 3   Comments Cues and demo for technique. S for safety. please see pt instructions for details.             Balance Exercises - 02/09/17 1627      Balance Exercises: Standing   Rockerboard Anterior/posterior;EO;Lateral;Head turns;EC;10 seconds;30 seconds;10 reps;Intermittent UE support   Other Standing Exercises Performed in // bars with min  guard to S for safety. Cues and demo for technique.            PT Education - 02/09/17 1629    Education provided Yes   Education Details PT provided pt with gaze stabilizations and balance HEP.    Person(s) Educated Patient   Methods Explanation;Demonstration;Verbal cues;Handout   Comprehension Returned demonstration;Verbalized understanding             PT Long Term Goals - 02/06/17 0930      PT LONG TERM GOAL #1   Title (TARGET DATE FOR ALL LTG 03/02/2017)  Pt will be independent with vestibular and balance HEP   Status New     PT LONG TERM GOAL #2   Title Pt will improve gaze stability as demonstrated by decrease in DVA to 3 line difference   Baseline 5 line difference    Status New     PT LONG TERM GOAL #3   Title FGA goal to be set   Baseline TBD   Status Achieved     PT LONG TERM GOAL #4   Title SOT composite score goal to be set   Baseline SOT WNL on 02/06/17   Status Deferred      PT LONG TERM GOAL #5   Title Pt will report 75% less symptoms of dysequilibrium while reaching high and low or looking at busy patterns to simulate regular work activities (not due to medication)   Status New     Additional Long Term Goals   Additional Long Term Goals Yes     PT LONG TERM GOAL #6   Title Pt will improve FGA score to 30/30 to decr. falls risk.    Baseline 22/30   Status New               Plan - 02/09/17 1631    Clinical Impression Statement Skilled session focused on progressing HEP to address balance impairments and dizziness. Pt demonstrated progress, as she only required 2 seated/standing rest breaks 2/2 dizziness, which incr. to 2-3/10 during amb. with eyes closed and during standing gaze stabilization x1 horizontal. Continue with POC.    Rehab Potential Good   PT Frequency 2x / week   PT Duration 8 weeks  will reassess at 4   PT Treatment/Interventions ADLs/Self Care Home Management;Canalith Repostioning;Gait training;Stair training;Functional mobility training;Therapeutic activities;Therapeutic exercise;Balance training;Neuromuscular re-education;Patient/family education;Vestibular   PT Next Visit Plan Review HEP prn. perform MSQ-set HEP. High level balance activities   PT Home Exercise Plan corrective saccades, x 1 viewing if pt able to tolerate   Consulted and Agree with Plan of Care Patient      Patient will benefit from skilled therapeutic intervention in order to improve the following deficits and impairments:  Decreased balance, Difficulty walking, Dizziness  Visit Diagnosis: Dizziness and giddiness  Unsteadiness on feet     Problem List Patient Active Problem List   Diagnosis Date Noted  . Hyperglycemia 11/28/2016  . Fever 11/28/2016  . Generalized anxiety disorder 02/02/2016  . Acute upper respiratory infection 02/02/2016  . Dysuria 02/02/2016  . Acute sinus infection 11/22/2015  . Anxiety state 11/22/2015  . Fatigue 09/20/2015   . Diarrhea 08/25/2015  . Cervicogenic headache 06/14/2015  . Nonallopathic lesion of cervical region 06/14/2015  . Neck pain on right side 05/24/2015  . Routine general medical examination at a health care facility 02/15/2015  . Abnormal urine odor 12/28/2014  . Severe obesity (BMI >= 40) (HCC) 12/28/2014  . Flank pain 11/17/2014  .  Headache 04/03/2014  . Heart palpitations 01/22/2014  . Obesity 03/21/2012    Deniah Saia L 02/09/2017, 4:33 PM  [facility] [address] [place], [place], [address] Phone: [phone]   Fax:  [phone]  Name: KAIJAH ABTS MRN: [id number] Date of Birth: 11-10-1980  Zerita Boers, PT,DPT 02/09/17 4:34 PM Phone: [phone] Fax: [phone]

## 2017-02-09 NOTE — Patient Instructions (Addendum)
Gaze Stabilization: Tip Card  1.Target must remain in focus, not blurry, and appear stationary while head is in motion. 2.Perform exercises with small head movements (45 to either side of midline). 3.Increase speed of head motion so long as target is in focus. 4.If you wear eyeglasses, be sure you can see target through lens (therapist will give specific instructions for bifocal / progressive lenses). 5.These exercises may provoke dizziness or nausea. Work through these symptoms. If too dizzy, slow head movement slightly. Rest between each exercise. 6.Exercises demand concentration; avoid distractions. 7.For safety, perform standing exercises close to a counter, wall, corner, or next to someone.  Copyright  VHI. All rights reserved.   Gaze Stabilization: Standing Feet Apart    Feet shoulder width apart, keeping eyes on target on wall _3-5___ feet away, tilt head down 15-30 and move head side to side for _20-30___ seconds. Repeat while moving head up and down for _20-30___ seconds. Do _2-3___ sessions per day.  Copyright  VHI. All rights reserved.   Up / Down Head Motion    Perform at counter with hand support as needed. Walking (10 feet) on solid surface, move head and eyes toward ceiling for _2___ steps.  Then, move head and eyes toward floor for __2__ steps. Repeat __4__ times per session. Do _1___ sessions per day.   Copyright  VHI. All rights reserved.  Side to Side Head Motion    Perform at counter with hand support as needed. Walking (10 feet) on solid surface, turn head and eyes to left for __2__ steps.  Then, turn head and eyes to opposite side for __2__ steps. Repeat sequence __4__ times per session. Do __1__ sessions per day.   Copyright  VHI. All rights reserved.  Walking    Perform at counter with hand support as needed. Walk (10 feet) on solid surface with eyes closed. Repeat __4__ times per session. Do __1__ sessions per day.   Copyright  VHI. All  rights reserved.

## 2017-02-12 NOTE — Telephone Encounter (Signed)
Pt called back request results. Pt was advised again it takes 4-5 days for imaging to be read. Pt also advised Dr D was out of the office last week. She said Dr D told her to call the day after her MRI for the results.

## 2017-02-12 NOTE — Telephone Encounter (Signed)
Normal MRI , absolutely fabulous looking brain. CD

## 2017-02-13 ENCOUNTER — Telehealth: Payer: Self-pay | Admitting: *Deleted

## 2017-02-13 MED ORDER — DIAZEPAM 5 MG PO TABS: 5.0000 mg | ORAL_TABLET | Freq: Four times a day (QID) | ORAL | 1 refills | Status: DC | PRN

## 2017-02-13 NOTE — Telephone Encounter (Signed)
LM for patient to call back and let us know what pharmacy to send Rx to

## 2017-02-13 NOTE — Telephone Encounter (Signed)
Per Dr Vickey Huger, spoke with patient to inform of MRI results. Patient stated she had already gotten a phone call this morning. She asked about Valium refill. This RN stated that Adron Bene has sent her request to Dr Vickey Huger. Advised patient she should get a call back this afternoon or tomorrow. She verbalized understanding, appreciation.

## 2017-02-13 NOTE — Addendum Note (Signed)
Addended by: Melvyn Novas on: 02/13/2017 05:03 PM   Modules accepted: Orders

## 2017-02-13 NOTE — Telephone Encounter (Signed)
Patient asks if you would be willing to refill her Diazepam? She states that she takes 1/2 tab once or twice a day depending on her work schedule. Reports that it does help with her dizziness and is able to function better at work with the medication.   Patient is aware of results below.

## 2017-02-13 NOTE — Telephone Encounter (Signed)
I am glad she found some help, but she now  has enough medication to find her footing.perhaps this knowledge helps with anxiety, too.  CD

## 2017-02-14 ENCOUNTER — Other Ambulatory Visit: Payer: Self-pay | Admitting: Neurology

## 2017-02-14 ENCOUNTER — Ambulatory Visit (HOSPITAL_COMMUNITY)
Admission: RE | Admit: 2017-02-14 | Discharge: 2017-02-14 | Disposition: A | Discharge status: Home / Self Care | Payer: BLUE CROSS/BLUE SHIELD | Source: Ambulatory Visit | Attending: Neurology | Admitting: Neurology

## 2017-02-14 DIAGNOSIS — H81313 Aural vertigo, bilateral: Secondary | ICD-10-CM | POA: Insufficient documentation

## 2017-02-14 DIAGNOSIS — H93A2 Pulsatile tinnitus, left ear: Secondary | ICD-10-CM

## 2017-02-14 DIAGNOSIS — R519 Headache, unspecified: Secondary | ICD-10-CM

## 2017-02-14 DIAGNOSIS — R51 Headache: Secondary | ICD-10-CM | POA: Diagnosis not present

## 2017-02-14 DIAGNOSIS — E669 Obesity, unspecified: Secondary | ICD-10-CM | POA: Diagnosis not present

## 2017-02-14 NOTE — Progress Notes (Signed)
Transcranial Doppler with Bubbles completed.  Dr. Roda Shutters performed. Right hand IV used. Right MCA insonated.  No HITS heard at rest or with Valsalva. No apparent PFO.    Farrel Demark, RDMS, RVT 02/14/2017

## 2017-02-14 NOTE — Telephone Encounter (Signed)
I called patient back, she is aware of information below. I faxed into [facility] per patient request.

## 2017-02-15 ENCOUNTER — Ambulatory Visit: Payer: BLUE CROSS/BLUE SHIELD | Admitting: Rehabilitative and Restorative Service Providers"

## 2017-02-16 ENCOUNTER — Ambulatory Visit: Payer: BLUE CROSS/BLUE SHIELD | Admitting: Rehabilitative and Restorative Service Providers"

## 2017-02-19 ENCOUNTER — Ambulatory Visit: Payer: BLUE CROSS/BLUE SHIELD | Admitting: Rehabilitative and Restorative Service Providers"

## 2017-02-21 ENCOUNTER — Ambulatory Visit: Payer: BLUE CROSS/BLUE SHIELD

## 2017-02-21 ENCOUNTER — Telehealth: Payer: Self-pay

## 2017-02-21 NOTE — Telephone Encounter (Signed)
LM with results below (per DPR) advised her to continue neuro rehab and we will see her back at her next appt.

## 2017-02-21 NOTE — Telephone Encounter (Signed)
-----   Message from Melvyn Novas, MD sent at 02/16/2017 10:27 AM EDT ----- Negative bubble study

## 2017-02-22 ENCOUNTER — Encounter: Payer: BLUE CROSS/BLUE SHIELD | Admitting: Rehabilitative and Restorative Service Providers"

## 2017-02-22 ENCOUNTER — Telehealth: Payer: Self-pay | Admitting: Rehabilitative and Restorative Service Providers"

## 2017-02-22 NOTE — Telephone Encounter (Signed)
PT called the patient and she was not aware of her visit this morning at 8:45.  She thought visit was for 3:30 today and tomorrow (due to r/s after power outage earlier this week).   I clarified her appt time for tomorrow and she plans to attend.  She also inquires about cancelling visits online as she feels this function does not work for scheduling.  PT recommended she ask our front desk that information when she arrives to appt tomorrow.  We discussed whether or not she wants to continue with PT and she does plan to continue per Dr. Oliva Bustard recommendations.  Julie Mcneil, PT

## 2017-02-23 ENCOUNTER — Ambulatory Visit: Payer: BLUE CROSS/BLUE SHIELD

## 2017-02-23 DIAGNOSIS — R42 Dizziness and giddiness: Secondary | ICD-10-CM | POA: Diagnosis not present

## 2017-02-23 DIAGNOSIS — R2681 Unsteadiness on feet: Secondary | ICD-10-CM

## 2017-02-23 NOTE — Patient Instructions (Addendum)
Turning    Tilt head down 15-30, lead with head and eyes and slowly make full turn to left with eyes open. Hold position until symptoms subside. Repeat full turn to the right side, lead with head and eyes and make sure you tilt head down 15-30 degrees. Repeat __3__ times per session. Do __1__ sessions per day.   Copyright  VHI. All rights reserved.   Gaze Stabilization: Tip Card  1.Target must remain in focus, not blurry, and appear stationary while head is in motion. 2.Perform exercises with small head movements (45 to either side of midline). 3.Increase speed of head motion so long as target is in focus. 4.If you wear eyeglasses, be sure you can see target through lens (therapist will give specific instructions for bifocal / progressive lenses). 5.These exercises may provoke dizziness or nausea. Work through these symptoms. If too dizzy, slow head movement slightly. Rest between each exercise. 6.Exercises demand concentration; avoid distractions. 7.For safety, perform standing exercises close to a counter, wall, corner, or next to someone.  Copyright  VHI. All rights reserved.   Gaze Stabilization: Sitting    Keeping eyes on target on wall 3-5 feet away, tilt head down 15-30 and move head side to side for _20-30___ seconds. Repeat while moving head up and down for _20-30___ seconds. Do __2-3__ sessions per day. Perform seated on days dizziness is increased and perform in standing on days you feel better.   Copyright  VHI. All rights reserved.

## 2017-02-23 NOTE — Therapy (Signed)
[facility] [address] [place], [place], [address] Phone: [phone]   Fax:  [phone]  Physical Therapy Treatment  Patient Details  Name: Julie Mcneil MRN: [id number] Date of Birth: 1981-05-30 Referring Provider: Melvyn Novas, MD  Encounter Date: 02/23/2017      PT End of Session - 02/23/17 1615    Visit Number 4   Number of Visits 9   Date for PT Re-Evaluation 04/01/17   Authorization Type BCBS    PT Start Time 1530   PT Stop Time 1610   PT Time Calculation (min) 40 min   Equipment Utilized During Treatment Gait belt   Activity Tolerance Patient tolerated treatment well   Behavior During Therapy [name] for tasks assessed/performed      Past Medical History:  Diagnosis Date  . Anxiety    Panic attacks  . Cervical dysplasia   . GERD (gastroesophageal reflux disease)   . Obesity   . UTI (urinary tract infection)    recurrent  . Vaginal yeast infection    recurrent    Past Surgical History:  Procedure Laterality Date  . CESAREAN SECTION  6962,9528   11-28-11  . COLPOSCOPY    . TUBAL LIGATION     2013 C-SECTION    There were no vitals filed for this visit.      Subjective Assessment - 02/23/17 1533    Subjective Pt reported she had a terrible dizzy spell last night and the Valium did not help. She described dizziness as lightheadedness/wooziness. Current dizziness 2-3/10, dizziness last night 6-7/10. Pt reported dizziness when talking to people and standing still.   Pertinent History obesity, R neck pain with radiculopathy, h/o chest pain, L knee pain   Patient Stated Goals To not feel dizzy anymore   Currently in Pain? Yes   Pain Score 3    Pain Location Head   Pain Orientation --  forehead   Pain Descriptors / Indicators Headache   Pain Type Acute pain   Pain Onset Today   Pain Frequency Constant   Aggravating Factors  nothing   Pain Relieving Factors Motrin                           Vestibular Treatment/Exercise - 02/23/17 1611      Vestibular Treatment/Exercise   Vestibular Treatment Provided Gaze;Habituation   Habituation Exercises Comment;360 degree Turns;180 degree Turns  and quarter turns   Gaze Exercises X1 Viewing Horizontal;X1 Viewing Vertical     180 degree Turns   Number of Reps  3   Symptom Description  Pt denied dizziness     360 degree Turns   Number of Reps  3   Symptom Description  Pt reported slight incr. in concordant dizziness   COMMENT pt reported dizziness was tolerable in order to perform as HEP to reduce sx's.  Please see pt instructions for details.      X1 Viewing Horizontal   Foot Position seated   Time --  20-30 sec.   Reps 3   Comments Pt required cues and demo for technique (tuck chin). Please see pt instructions for details.      X1 Viewing Vertical   Foot Position seated   Time --  20-30 sec.    Reps 3   Comments Cues and demo for technique. Pt reported less dizziness during vertical vs. horizontal head turns. please see pt instructions for details.     Standing  on bosu ball: pt performed feet apart (30sec.), feet apart with eyes closed (10sec., ball toss x25 reps, and vertical/horizontal head turns. All performed x3 reps. Intermittent UE support, progressed to no UE support. S to min guard for safety and cues and demo for technique.            PT Education - 02/23/17 1614    Education provided Yes   Education Details PT modified gaze stabilization to seated position due to pt's incr. dizziness and added 360 degree turns to HEP.    Person(s) Educated Patient   Methods Explanation;Demonstration;Verbal cues;Handout   Comprehension Returned demonstration;Verbalized understanding             PT Long Term Goals - 02/06/17 0930      PT LONG TERM GOAL #1   Title (TARGET DATE FOR ALL LTG 03/02/2017)  Pt will be independent with vestibular and balance HEP   Status New      PT LONG TERM GOAL #2   Title Pt will improve gaze stability as demonstrated by decrease in DVA to 3 line difference   Baseline 5 line difference    Status New     PT LONG TERM GOAL #3   Title FGA goal to be set   Baseline TBD   Status Achieved     PT LONG TERM GOAL #4   Title SOT composite score goal to be set   Baseline SOT WNL on 02/06/17   Status Deferred     PT LONG TERM GOAL #5   Title Pt will report 75% less symptoms of dysequilibrium while reaching high and low or looking at busy patterns to simulate regular work activities (not due to medication)   Status New     Additional Long Term Goals   Additional Long Term Goals Yes     PT LONG TERM GOAL #6   Title Pt will improve FGA score to 30/30 to decr. falls risk.    Baseline 22/30   Status New               Plan - 02/23/17 1615    Clinical Impression Statement Pt demonstrated progress, as she was able to perform balance activities on Bosu ball with less assist, indicating improved vestibular input. However, pt reported incr. dizziness over the last day and reported she has not performed chin tuck during gaze stabilization. PT modified gaze stab. exercise to seated position and added 360 degree turn (habituation) in order to improve motion sensitivity.    Rehab Potential Good   PT Frequency 2x / week   PT Duration 8 weeks  will reassess at 4   PT Treatment/Interventions ADLs/Self Care Home Management;Canalith Repostioning;Gait training;Stair training;Functional mobility training;Therapeutic activities;Therapeutic exercise;Balance training;Neuromuscular re-education;Patient/family education;Vestibular   PT Next Visit Plan Review HEP prn. High level balance activities. Begin to check goals.    PT Home Exercise Plan corrective saccades, x 1 viewing if pt able to tolerate   Consulted and Agree with Plan of Care Patient      Patient will benefit from skilled therapeutic intervention in order to improve the following  deficits and impairments:  Decreased balance, Difficulty walking, Dizziness  Visit Diagnosis: Dizziness and giddiness  Unsteadiness on feet     Problem List Patient Active Problem List   Diagnosis Date Noted  . Hyperglycemia 11/28/2016  . Fever 11/28/2016  . Generalized anxiety disorder 02/02/2016  . Acute upper respiratory infection 02/02/2016  . Dysuria 02/02/2016  . Acute sinus infection 11/22/2015  .  Anxiety state 11/22/2015  . Fatigue 09/20/2015  . Diarrhea 08/25/2015  . Cervicogenic headache 06/14/2015  . Nonallopathic lesion of cervical region 06/14/2015  . Neck pain on right side 05/24/2015  . Routine general medical examination at a health care facility 02/15/2015  . Abnormal urine odor 12/28/2014  . Severe obesity (BMI >= 40) (HCC) 12/28/2014  . Flank pain 11/17/2014  . Headache 04/03/2014  . Heart palpitations 01/22/2014  . Obesity 03/21/2012    Miller,Jennifer L 02/23/2017, 4:19 PM  [facility] [address] [place], [place], [address] Phone: [phone]   Fax:  [phone]  Name: Julie Mcneil MRN: [id number] Date of Birth: Mar 14, 1981  Zerita Boers, PT,DPT 02/23/17 4:20 PM Phone: [phone] Fax: [phone]

## 2017-02-26 ENCOUNTER — Ambulatory Visit: Payer: BLUE CROSS/BLUE SHIELD | Admitting: Women's Health

## 2017-02-26 ENCOUNTER — Telehealth: Payer: Self-pay

## 2017-02-26 ENCOUNTER — Other Ambulatory Visit: Payer: Self-pay | Admitting: Women's Health

## 2017-02-26 ENCOUNTER — Other Ambulatory Visit: Payer: BLUE CROSS/BLUE SHIELD

## 2017-02-26 DIAGNOSIS — R399 Unspecified symptoms and signs involving the genitourinary system: Secondary | ICD-10-CM

## 2017-02-26 NOTE — Telephone Encounter (Signed)
I called patient back but voice mail box is full and I cannot leave a message. Claudia notified and lab order placed.

## 2017-02-26 NOTE — Telephone Encounter (Signed)
Patient w UTI symptoms. She made appt for 12:20pm with you but is calling to ask if any way you would work with her and allow her to drop my and just leave a specimen for diagnosis.  She said she has her 3 children and is keeping another one. She will have 4 children under 36 years old with her.  Ok for urine specimen only?

## 2017-02-26 NOTE — Telephone Encounter (Signed)
Ok for UA only/ today only.

## 2017-02-27 ENCOUNTER — Telehealth: Payer: Self-pay

## 2017-02-27 ENCOUNTER — Ambulatory Visit: Payer: BLUE CROSS/BLUE SHIELD

## 2017-02-27 LAB — URINALYSIS W MICROSCOPIC + REFLEX CULTURE
Bilirubin Urine: NEGATIVE
Casts: NONE SEEN [LPF]
Crystals: NONE SEEN [HPF]
Glucose, UA: NEGATIVE
Ketones, ur: NEGATIVE
Leukocytes, UA: NEGATIVE
Nitrite: POSITIVE — AB
Protein, ur: NEGATIVE
Specific Gravity, Urine: 1.02 (ref 1.001–1.035)
Yeast: NONE SEEN [HPF]
pH: 6.5 (ref 5.0–8.0)

## 2017-02-27 NOTE — Telephone Encounter (Signed)
PT called and spoke to pt regarding no-show to PT treatment today at 0845. Pt stated she had a dizzy spell and would like to cancel tomorrow's appt (02/28/17)and move to Friday, 4/27 at 800. Front desk informed and appt. Cancelled and r/s.  Zerita Boers, PT,DPT 02/27/17 4:28 PM Phone: [phone] Fax: [phone]

## 2017-02-28 ENCOUNTER — Ambulatory Visit: Payer: BLUE CROSS/BLUE SHIELD

## 2017-02-28 ENCOUNTER — Telehealth: Payer: Self-pay

## 2017-02-28 ENCOUNTER — Other Ambulatory Visit: Payer: Self-pay | Admitting: Women's Health

## 2017-02-28 DIAGNOSIS — N3001 Acute cystitis with hematuria: Secondary | ICD-10-CM

## 2017-02-28 LAB — URINE CULTURE

## 2017-02-28 MED ORDER — AMPICILLIN 500 MG PO CAPS: 500.0000 mg | ORAL_CAPSULE | Freq: Three times a day (TID) | ORAL | 0 refills | Status: DC

## 2017-02-28 MED ORDER — FLUCONAZOLE 150 MG PO TABS: 150.0000 mg | ORAL_TABLET | Freq: Once | ORAL | 1 refills | Status: AC

## 2017-02-28 MED ORDER — NYSTATIN-TRIAMCINOLONE 100000-0.1 UNIT/GM-% EX OINT: 1.0000 "application " | TOPICAL_OINTMENT | Freq: Two times a day (BID) | CUTANEOUS | 0 refills | Status: DC

## 2017-02-28 NOTE — Telephone Encounter (Signed)
Patient said burning at end of urination still occurring. Urine smells like sulfur and is cloudy in the toilet.  Patient is taking the Azo but said it makes her nauseated and she has to take it at night because of this.  She asked if she should come back in to try to figure out what else could be going on.

## 2017-02-28 NOTE — Telephone Encounter (Signed)
Telephone call, states continues with pain at the end of the stream of urination, UA did have positive nitrites has not taken Azo for greater than one week. States all symptoms are urinary, none vaginal. Has allergy to Macrobid and levofloxacin, will try ampicillin 500 3 times a day for 7 days. We'll do a test of cure after at her annual exam in May.

## 2017-02-28 NOTE — Telephone Encounter (Signed)
-----   Message from Harrington Challenger, NP sent at 02/28/2017 10:12 AM EDT ----- Please call and review urine culture is negative, does not need an antibiotic. I think she has been taking AZO can continue if symptoms persist. Have her call me if UTI symptoms continue.

## 2017-03-02 ENCOUNTER — Ambulatory Visit: Payer: BLUE CROSS/BLUE SHIELD | Admitting: Physical Therapy

## 2017-03-12 ENCOUNTER — Ambulatory Visit: Payer: BLUE CROSS/BLUE SHIELD | Admitting: Physical Therapy

## 2017-03-16 ENCOUNTER — Telehealth: Payer: Self-pay | Admitting: Physical Therapy

## 2017-03-16 ENCOUNTER — Ambulatory Visit: Payer: BLUE CROSS/BLUE SHIELD | Attending: Neurology | Admitting: Physical Therapy

## 2017-03-16 NOTE — Telephone Encounter (Signed)
Pt contacted via phone regarding no show to therapy appointment today after appointment being rescheduled 2-3 times.  No answer and mailbox was full so was unable to leave a message.  Will attempt to contact again because today was last scheduled visit and will need to determine if pt would like to continue with therapy sessions or would like to be discharged.  Edman CircleAudra Hall, PT, DPT 03/16/17    12:39 PM

## 2017-03-20 ENCOUNTER — Encounter: Payer: BLUE CROSS/BLUE SHIELD | Admitting: Women's Health

## 2017-03-21 ENCOUNTER — Encounter: Payer: Self-pay | Admitting: Gynecology

## 2017-03-22 IMAGING — CR DG CERVICAL SPINE COMPLETE 4+V
6 series · 6 of 6 positions shown · non-contrast
Comparison: None.

CLINICAL DATA: Cervicalgia with right-sided radicular symptoms

EXAM:
CERVICAL SPINE  4+ VIEWS

[view not recorded (1 of 6)]
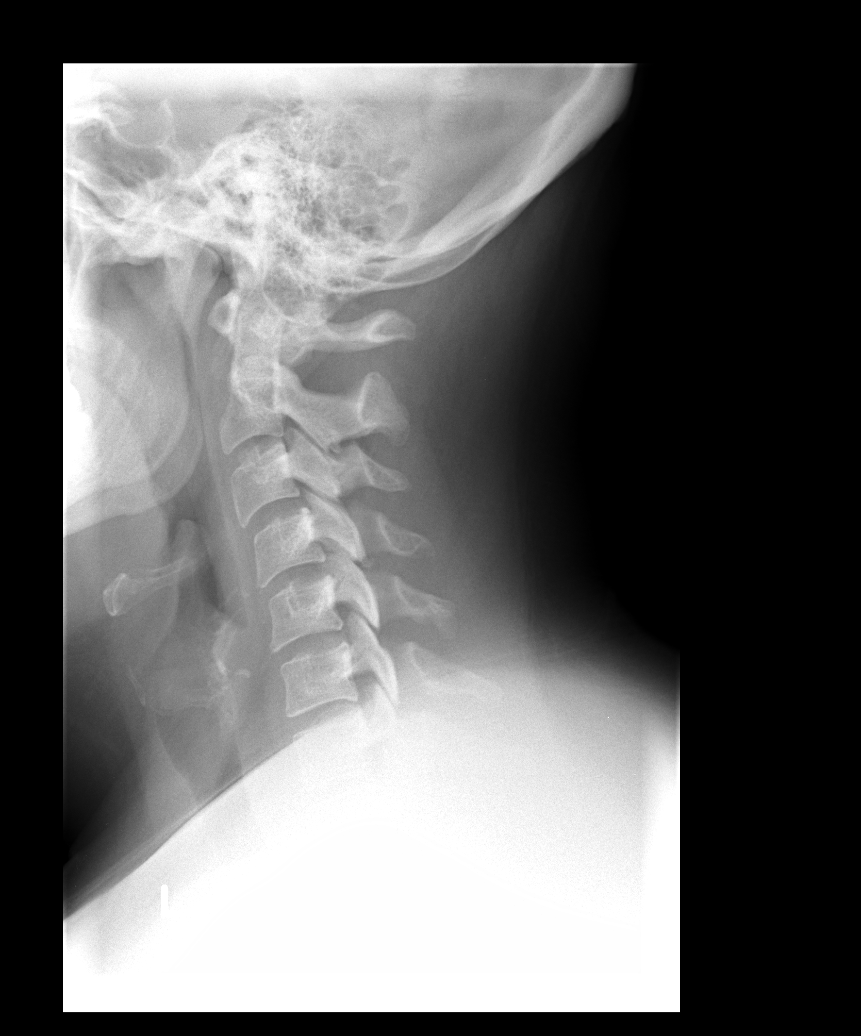

[view not recorded (2 of 6)]
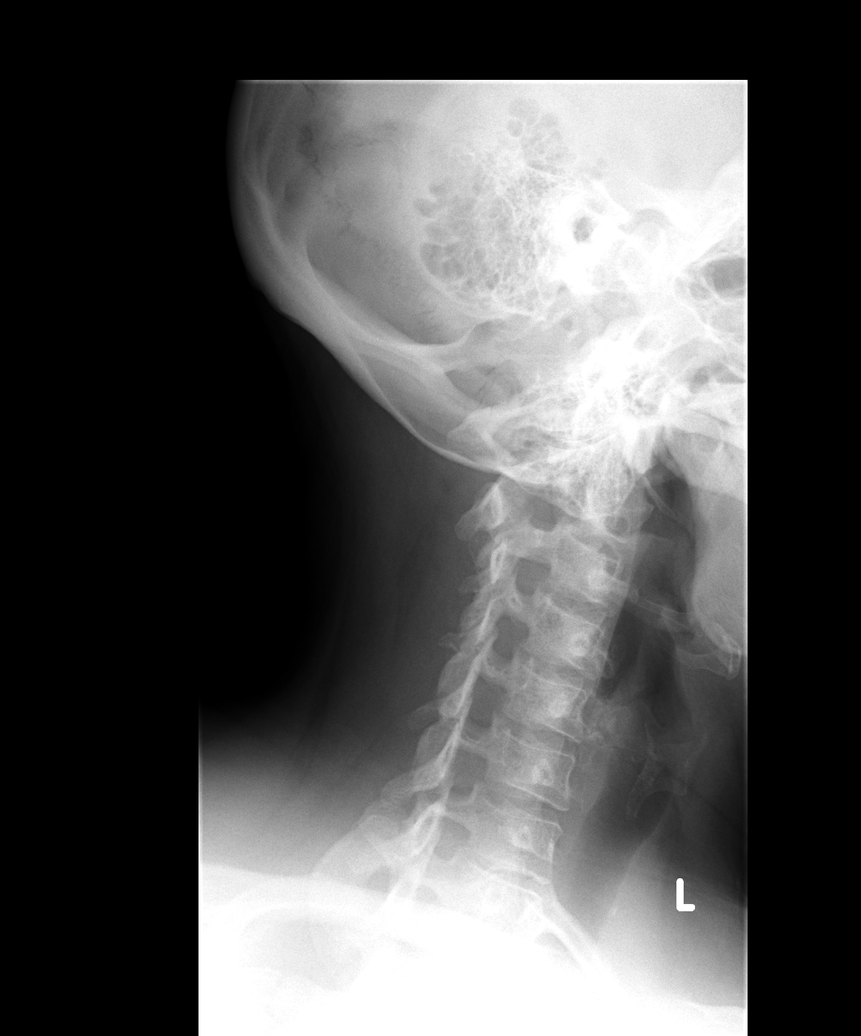

[view not recorded (3 of 6)]
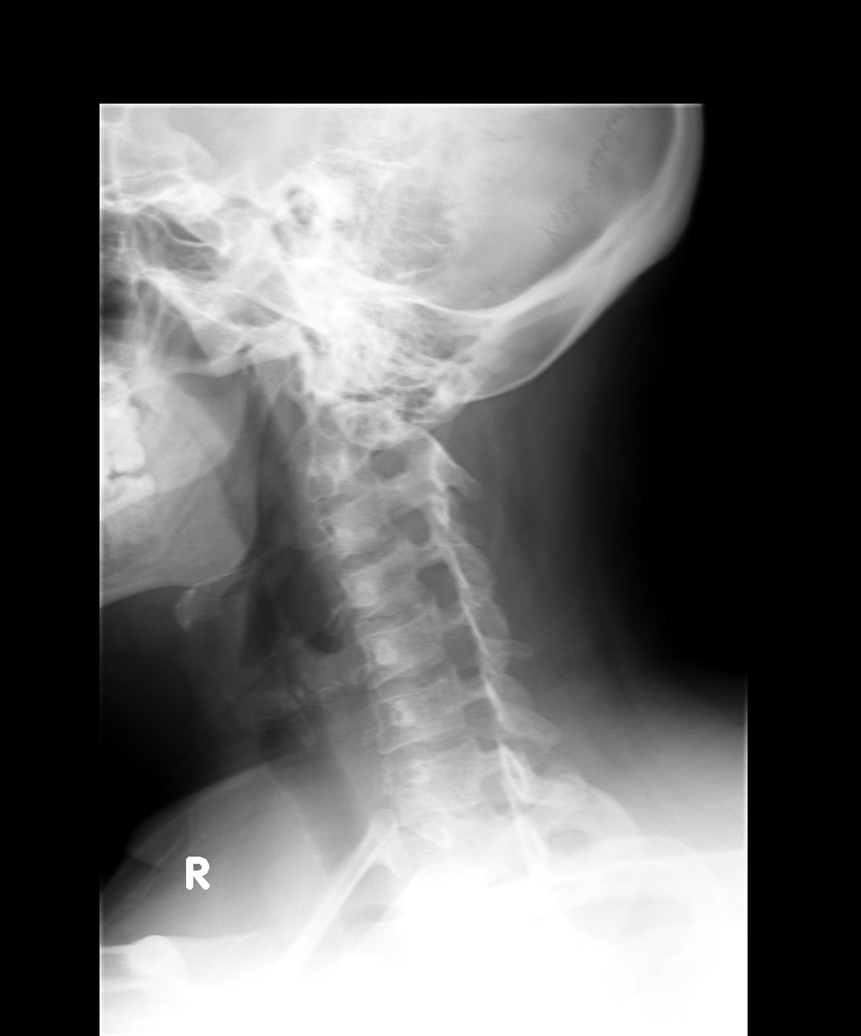

[view not recorded (4 of 6)]
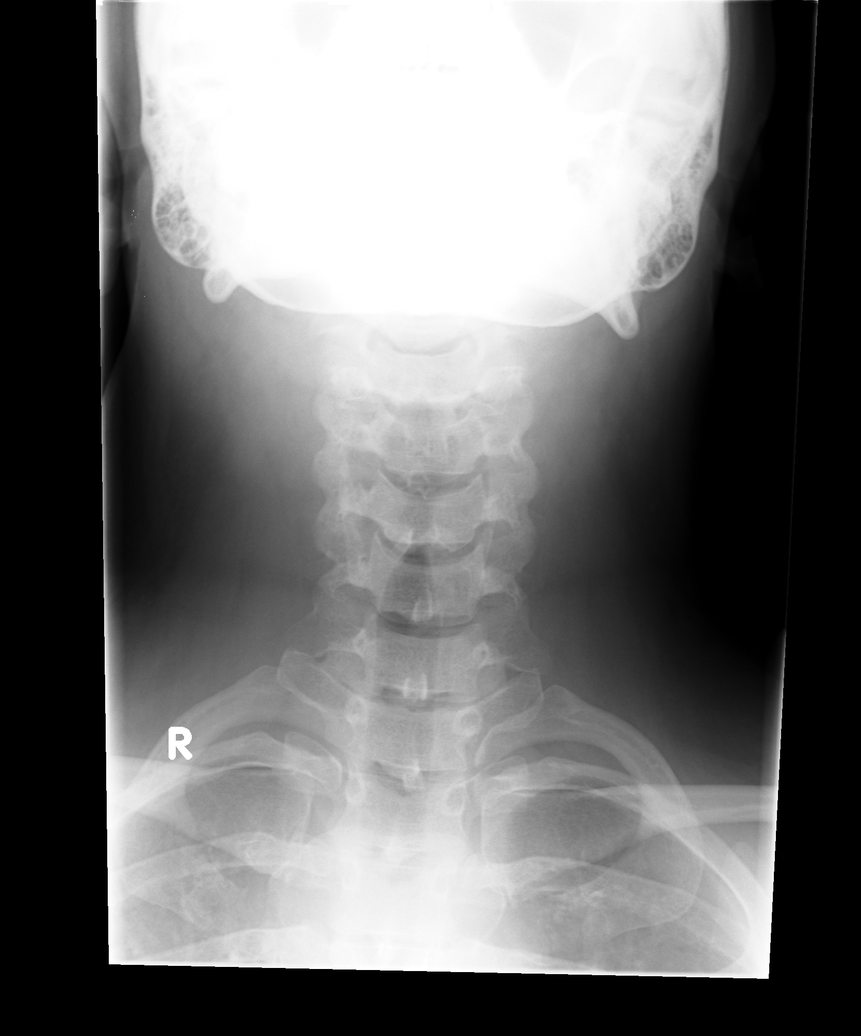

[view not recorded (5 of 6)]
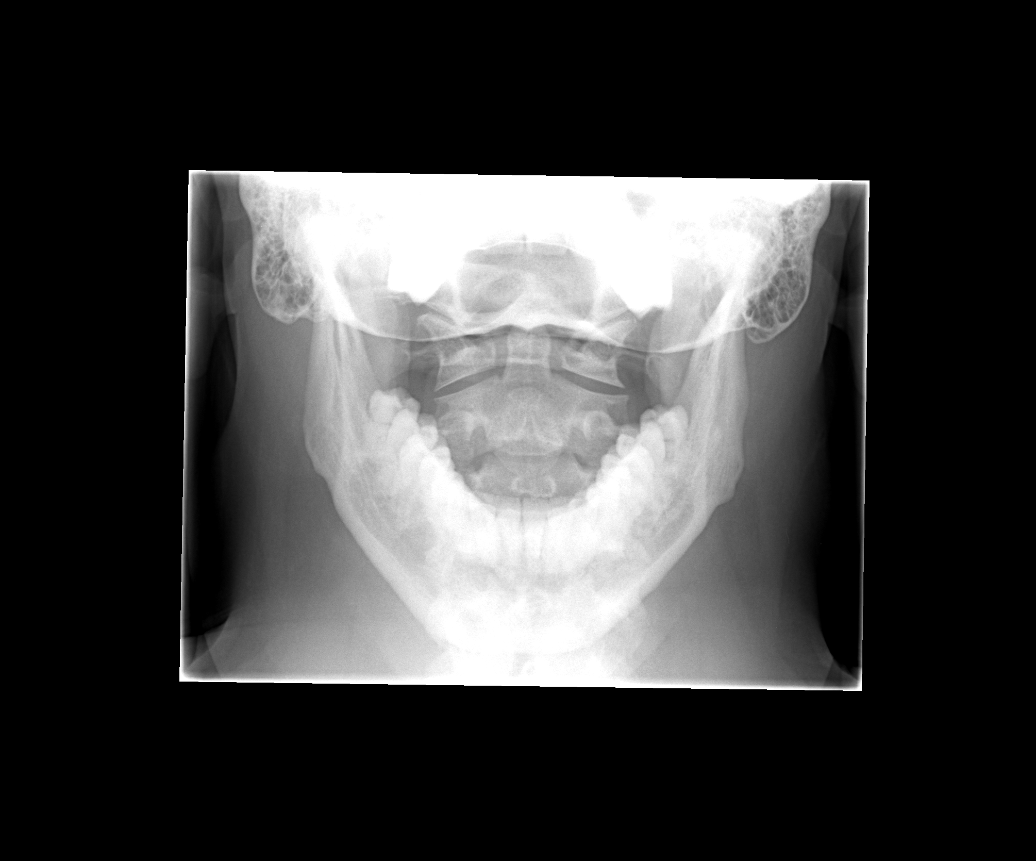

[view not recorded (6 of 6)]
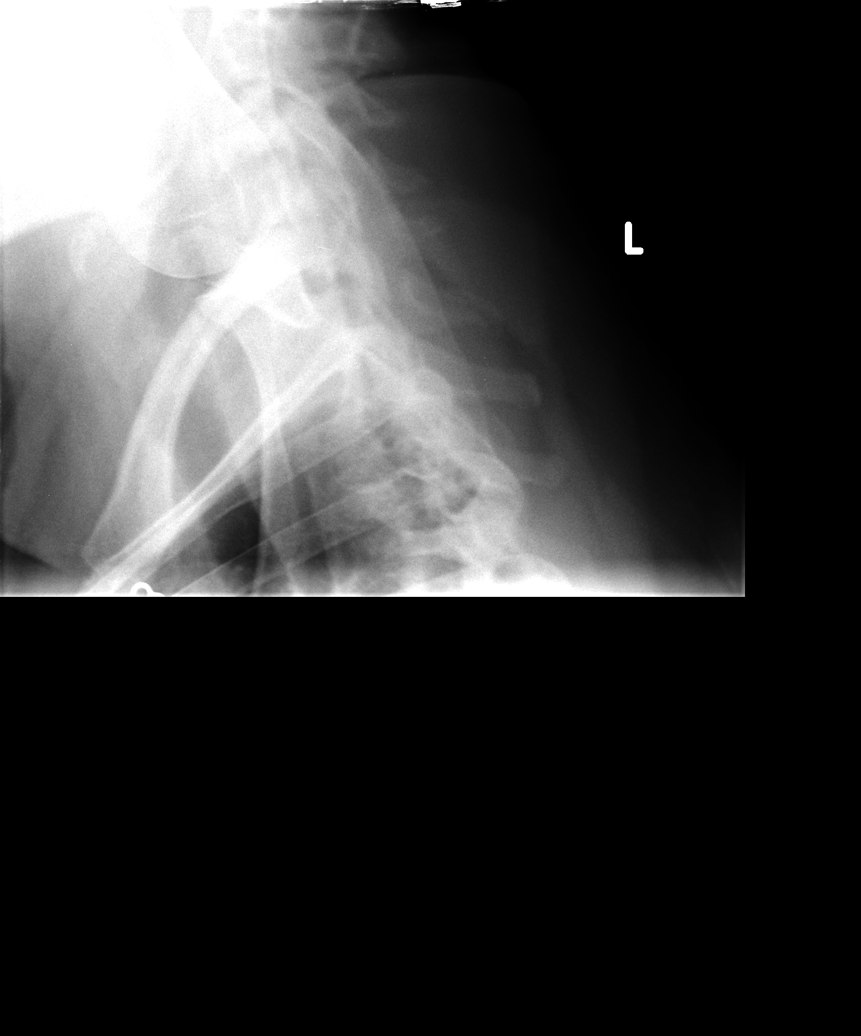

[6 of 6 positions shown; findings below may reference images not displayed]

FINDINGS: Frontal, lateral, open-mouth odontoid, and bilateral oblique views
were obtained. There is no fracture or spondylolisthesis.
Prevertebral soft tissues and predental space regions are normal.
Disc spaces appear unremarkable. There is mild facet hypertrophy at
C3-4 and C4-5 bilaterally and at C5-6 on the left.
IMPRESSION: Mild facet osteoarthritic changes several levels. No fracture or
spondylolisthesis. No appreciable disc space narrowing.

## 2017-04-03 ENCOUNTER — Ambulatory Visit (INDEPENDENT_AMBULATORY_CARE_PROVIDER_SITE_OTHER): Payer: BLUE CROSS/BLUE SHIELD | Admitting: Neurology

## 2017-04-03 ENCOUNTER — Encounter: Payer: Self-pay | Admitting: Neurology

## 2017-04-03 VITALS — BP 139/92 | HR 83 | Resp 20 | Ht 64.0 in | Wt 260.0 lb

## 2017-04-03 DIAGNOSIS — G4753 Recurrent isolated sleep paralysis: Secondary | ICD-10-CM | POA: Diagnosis not present

## 2017-04-03 DIAGNOSIS — G4710 Hypersomnia, unspecified: Secondary | ICD-10-CM | POA: Diagnosis not present

## 2017-04-03 DIAGNOSIS — F518 Other sleep disorders not due to a substance or known physiological condition: Secondary | ICD-10-CM

## 2017-04-03 DIAGNOSIS — H81319 Aural vertigo, unspecified ear: Secondary | ICD-10-CM | POA: Diagnosis not present

## 2017-04-03 DIAGNOSIS — G4730 Sleep apnea, unspecified: Secondary | ICD-10-CM

## 2017-04-03 DIAGNOSIS — R002 Palpitations: Secondary | ICD-10-CM | POA: Diagnosis not present

## 2017-04-03 MED ORDER — SERTRALINE HCL 25 MG PO TABS: 25.0000 mg | ORAL_TABLET | Freq: Every day | ORAL | 3 refills | Status: DC

## 2017-04-03 NOTE — Progress Notes (Signed)
Provider:  Melvyn Novas, M D  Referring Provider: Dr Angelica Pou   Primary Care Physician:  Corwin Levins, MD  Chief Complaint  Patient presents with  . Follow-up    still having vertigo    I have the pleasure of seeing Mrs. Coulter today on 04/03/2017. She has been enrolled and vestibular rehabilitation but she has not felt that this was a success. Her vertigo seems to be suppressed by Valium so she can work but has not improved for the long-term. She may feel overall a little bit better. There is also concerned that the Valium is habit-forming. She has wondered if anxiety and stress may contribute or trigger some of her attacks. She had a normal ear nose and throat exam and I have received correspondence from the vestibular rehabilitation center. Normal MRI brain.  I will recommend a trial of antidepressants- she feels tired, reports hypersomnia and vivid dreams, but does not snore ( according to husband ) . isolated sleep paralysis. Very VIVID dreams.  Will order PSG with MSLT. She is not on SSRI. Weaned off lexapro.    HPI: CD ZAHRIA DING is a 36 y.o. female , seen here as a referral from her ophthalmologist Dr. Hazle Quant for an evaluation of headaches and dizziness. Onset of dizziness since January 2018 . Chief complaint according to patient :" I cannot live with this dizziness anymore." Mrs. Tooley disc at that she has seen her ophthalmologist to make sure that she doesn't suffer from high intracranial or ocular pressure, her eye workup was completely within normal limits, she has normal color vision, good visual acuity, only the  [facility] were a little blurred at the disc margin nasally. . The optometrist mentioned that there was an indistinct margin nasally at the left and right optic nerve papilla. For this reason I will see the patient today in a neurologic consultation. She has seen ENT , with normal  exam and audiology.  Her headaches have gotten better, they respond to ibuprofen which  she takes daily. Vertigo is her main concern today and she states that the dizziness is a life quality limiting perception of feeling of falling, fear of passing out and these are highly anxious. She describes an associated tinnitus. She gave a very impressive description of her tinnitus describing that "her ears are screaming "when she has a vertigo attack. Vertigo headache and tinnitus onset were closely related. The patient is morbidly obese. She has other concerns , too. Such as a thrush on her throat, and she reports that her vertigo is worse at night before she goes back to retreat. The vertigo becomes more intense towards the evening hours. She often retreats to bed as early as 7 PM , she hasn't cooked in an weeks because she cannot bend at the stove. The dizziness is also causing nausea and even culminated in vomiting.  The patient's family encompasses her husband and 3 children.  Social history: The patient is a nonsmoker, nondrinker, limited caffeine user-  in AM, 1 coke in the morning. 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 Married, with 3 children.      Review of Systems: Out of a complete 14 system review, the patient complains of only the following symptoms, and all other reviewed systems are negative. Her tinnitus get worse when she is in bed, she struggles to find a comfortable position. The dizziness is not affected. She has  lost appetite and weight.  Dizziness, she feels is if she is on a boat in heavy sea. She feels she is in motion, not the room around her. he feels tired, reports hypersomnia and vivid dreams, but does not snore ( according to husband ) . isolated sleep paralysis.  Epworth 16 , FSS 54.    Social History   Social History  . Marital status: Married    Spouse name: N/A  . Number of children: 3  . Years of education: 15   Occupational History  . Engineer, manufacturing Education - [employer]   Social History Main Topics  . Smoking status: Never Smoker  . Smokeless tobacco: Never Used  . Alcohol use No  . Drug use: No  . Sexual activity: Yes    Birth control/ protection: Surgical     Comment: TUBAL LIGATION   Other Topics Concern  . Not on file   Social History Narrative   Fun: Going to [employer]    Family History  Problem Relation Age of Onset  . Early death Mother   . Heart disease Maternal Grandmother   . Diabetes Maternal Grandmother   . Cancer Maternal Grandmother        Lymphoma  . Hypertension Paternal Grandmother   . Stroke Paternal Grandmother   . Hypertension Paternal Grandfather   . Anesthesia problems Neg Hx   . Hypotension Neg Hx   . Malignant hyperthermia Neg Hx   . Pseudochol deficiency Neg Hx     Past Medical History:  Diagnosis Date  . Anxiety    Panic attacks  . Cervical dysplasia   . GERD (gastroesophageal reflux disease)   . Obesity   . UTI (urinary tract infection)    recurrent  . Vaginal yeast infection    recurrent    Past Surgical History:  Procedure Laterality Date  . CESAREAN SECTION  1610,[id number]   11-28-11  . COLPOSCOPY    . TUBAL LIGATION     2013 C-SECTION    Current Outpatient Prescriptions  Medication Sig Dispense Refill  . diazepam (VALIUM) 5 MG tablet Take 1 tablet (5 mg total) by mouth every 6 (six) hours as needed for anxiety. 60 tablet 1  . ibuprofen (ADVIL,MOTRIN) 200 MG tablet Take 200 mg by mouth every 6 (six) hours as needed.    . nystatin-triamcinolone ointment (MYCOLOG) Apply 1 application topically 2 (two) times daily. 30 g 0  . meclizine (ANTIVERT) 25 MG tablet Take 25 mg by mouth 3 (three) times daily as needed for dizziness.     No current facility-administered medications for this visit.     Allergies as of 04/03/2017 - Review Complete 04/03/2017  Allergen Reaction Noted  . Sumatriptan Shortness Of Breath 06/29/2015  . Levaquin [levofloxacin in d5w] Nausea Only 12/02/2015  .  Nitrofurantoin monohyd macro Hives and Nausea And Vomiting 04/10/2011    Vitals: BP (!) 139/92   Pulse 83   Resp 20   Ht 5\' 4"  (1.626 m)   Wt 260 lb (117.9 kg)   BMI 44.63 kg/m  Last Weight:  Wt Readings from Last 1 Encounters:  04/03/17 260 lb (117.9 kg)   VWU:JWJX mass index is 44.63 kg/m.     Last Height:   Ht Readings from Last 1 Encounters:  04/03/17 5\' 4"  (1.626 m)    Physical exam:  General: The patient is awake, alert and appears in acute distress. The patient is well groomed. She is unable to move  quickly. She avoids turns.  Head: Normocephalic, atraumatic. Neck is supple. Mallampati 3  neck circumference: 17.5 . Nasal airflow patent , TMJ is not evident . Retrognathia is seen.  Cardiovascular:  Regular rate and rhythm, without  murmurs or carotid bruit, and without distended neck veins. Respiratory: Lungs are clear to auscultation. Skin:  Without evidence of edema, or rash Trunk: BMI is 43 . The patient's posture is erect.  Neurologic exam : The patient is awake and alert, oriented to place and time.    Attention span & concentration ability appears normal.  Mood and affect are anxious  Cranial nerves: Pupils are equal and briskly reactive to light. Extraocular movements  in vertical and horizontal planes intact and without nystagmus. Eye exam looked normal.   Visual fields by finger perimetry are intact.Rapid head movement did to cause end point nystagmus, but it was not lateralized to left or right. She felt dizzy enough to hold onto an object. She describes tinnitus as of 4 and sometimes as a pulsation. Hearing to finger rub intact. Facial sensation intact to fine touch. Facial motor strength is symmetric and tongue and uvula move midline. Shoulder shrug was symmetrical.   Motor exam: Normal tone, muscle bulk and symmetric strength in all extremities. Sensory:  Fine touch, pinprick and vibration were tested in all extremities. Proprioception tested in the upper  extremities was normal. Coordination: Rapid alternating movements in the fingers/hands was normal. Finger-to-nose maneuver  normal without evidence of ataxia, dysmetria or tremor. Gait and station: Patient walks without assistive device and is able unassisted to climb up to the exam table. Strength within normal limits.  Stance is stable and normal. Toe and heel stand were tested. Tandem gait is fragmented. Turns with 4 Steps, holding on- very dizzy but no preferred direction.. Romberg testing is negative but patient reports feeling as if swaying. Marland Kitchen Deep tendon reflexes: in the  upper and lower extremities are symmetric and intact. Babinski maneuver response is downgoing  The patient was advised of the nature of the diagnosed sleep disorder , the treatment options and risks for general a health and wellness arising from not treating the condition.  I spent more than 25 minutes of face to face time with the patient. Greater than 50% of time was spent in counseling and coordination of care. We have discussed the diagnosis and differential and I answered the patient's questions.     Assessment:  After physical and neurologic examination, review of laboratory studies,  Personal review of imaging studies, reports of other /same  Imaging studies ,  Results of polysomnography/ neurophysiology testing and pre-existing records as far as provided in visit., my assessment is   1) Evaluation for pseudotumor cerebri: super obese, headaches, tinnitus and nausea. Minor area of puffiness in the nasal optic disc. Normal ENT and normal  brain MRI . She also reports numbness in her shoulders radiating to arms and hands. Ruled out Arnold-Chiari.  Her dizziness-vertigo has been suppressed by she takes Valium which allows her to go to work but when she's not on medication she still has symptoms.ANXIETY - hold off on SSRI until we have sleep test...  3)In addition she is very sleepy during the day feels that she doesn't  get quality sleep at night and reports vivid dreams waking up with palpitations sometimes very scary dreams as well as isolated sleep paralysis and hypnagogic hypnopompic hallucinations. I will order a PSG - SPLIT if AHI 20, follow with MSLT.   Plan:  Treatment  plan and additional workup :  PSG and MSLT.  after test, start on    Melvyn Novasarmen Joeziah Voit MD  04/03/2017   CC: Dr. Sol Blazingon Digby and Dr Jimmey RalphParker, OD

## 2017-04-06 ENCOUNTER — Ambulatory Visit: Payer: BLUE CROSS/BLUE SHIELD | Attending: Neurology | Admitting: Physical Therapy

## 2017-04-06 ENCOUNTER — Telehealth: Payer: Self-pay | Admitting: Neurology

## 2017-04-06 DIAGNOSIS — IMO0001 Reserved for inherently not codable concepts without codable children: Secondary | ICD-10-CM

## 2017-04-06 DIAGNOSIS — I952 Hypotension due to drugs: Secondary | ICD-10-CM

## 2017-04-06 DIAGNOSIS — G4710 Hypersomnia, unspecified: Secondary | ICD-10-CM

## 2017-04-06 NOTE — Telephone Encounter (Signed)
I will accept HST with reservation. See my clinic visit note for patient with vertigo, dizziness and near fainting.  CD

## 2017-04-06 NOTE — Telephone Encounter (Signed)
[employer] denied NPSG/MSLT until HST is completed.  Do you want to order HST?

## 2017-04-09 ENCOUNTER — Encounter: Payer: Self-pay | Admitting: Women's Health

## 2017-04-09 ENCOUNTER — Ambulatory Visit (INDEPENDENT_AMBULATORY_CARE_PROVIDER_SITE_OTHER): Payer: BLUE CROSS/BLUE SHIELD | Admitting: Women's Health

## 2017-04-09 VITALS — BP 130/85 | Ht 65.0 in | Wt 261.8 lb

## 2017-04-09 DIAGNOSIS — R7309 Other abnormal glucose: Secondary | ICD-10-CM

## 2017-04-09 DIAGNOSIS — Z1151 Encounter for screening for human papillomavirus (HPV): Secondary | ICD-10-CM | POA: Diagnosis not present

## 2017-04-09 DIAGNOSIS — Z01419 Encounter for gynecological examination (general) (routine) without abnormal findings: Secondary | ICD-10-CM

## 2017-04-09 NOTE — Patient Instructions (Signed)
Carbohydrate Counting for Diabetes Mellitus, Adult Carbohydrate counting is a method for keeping track of how many carbohydrates you eat. Eating carbohydrates naturally increases the amount of sugar (glucose) in the blood. Counting how many carbohydrates you eat helps keep your blood glucose within normal limits, which helps you manage your diabetes (diabetes mellitus). It is important to know how many carbohydrates you can safely have in each meal. This is different for every person. A diet and nutrition specialist (registered dietitian) can help you make a meal plan and calculate how many carbohydrates you should have at each meal and snack. Carbohydrates are found in the following foods:  Grains, such as breads and cereals.  Dried beans and soy products.  Starchy vegetables, such as potatoes, peas, and corn.  Fruit and fruit juices.  Milk and yogurt.  Sweets and snack foods, such as cake, cookies, candy, chips, and soft drinks.  How do I count carbohydrates? There are two ways to count carbohydrates in food. You can use either of the methods or a combination of both. Reading "Nutrition Facts" on packaged food The "Nutrition Facts" list is included on the labels of almost all packaged foods and beverages in the [place] It includes:  The serving size.  Information about nutrients in each serving, including the grams (g) of carbohydrate per serving.  To use the "Nutrition Facts":  Decide how many servings you will have.  Multiply the number of servings by the number of carbohydrates per serving.  The resulting number is the total amount of carbohydrates that you will be having.  Learning standard serving sizes of other foods When you eat foods containing carbohydrates that are not packaged or do not include "Nutrition Facts" on the label, you need to measure the servings in order to count the amount of carbohydrates:  Measure the foods that you will eat with a food scale or  measuring cup, if needed.  Decide how many standard-size servings you will eat.  Multiply the number of servings by 15. Most carbohydrate-rich foods have about 15 g of carbohydrates per serving. ? For example, if you eat 8 oz (170 g) of strawberries, you will have eaten 2 servings and 30 g of carbohydrates (2 servings x 15 g = 30 g).  For foods that have more than one food mixed, such as soups and casseroles, you must count the carbohydrates in each food that is included.  The following list contains standard serving sizes of common carbohydrate-rich foods. Each of these servings has about 15 g of carbohydrates:   hamburger bun or  English muffin.   oz (15 mL) syrup.   oz (14 g) jelly.  1 slice of bread.  1 six-inch tortilla.  3 oz (85 g) cooked rice or pasta.  4 oz (113 g) cooked dried beans.  4 oz (113 g) starchy vegetable, such as peas, corn, or potatoes.  4 oz (113 g) hot cereal.  4 oz (113 g) mashed potatoes or  of a large baked potato.  4 oz (113 g) canned or frozen fruit.  4 oz (120 mL) fruit juice.  4-6 crackers.  6 chicken nuggets.  6 oz (170 g) unsweetened dry cereal.  6 oz (170 g) plain fat-free yogurt or yogurt sweetened with artificial sweeteners.  8 oz (240 mL) milk.  8 oz (170 g) fresh fruit or one small piece of fruit.  24 oz (680 g) popped popcorn.  Example of carbohydrate counting Sample meal  3 oz (85 g) chicken breast.    6 oz (170 g) brown rice.  4 oz (113 g) corn.  8 oz (240 mL) milk.  8 oz (170 g) strawberries with sugar-free whipped topping. Carbohydrate calculation 1. Identify the foods that contain carbohydrates: ? Rice. ? Corn. ? Milk. ? Strawberries. 2. Calculate how many servings you have of each food: ? 2 servings rice. ? 1 serving corn. ? 1 serving milk. ? 1 serving strawberries. 3. Multiply each number of servings by 15 g: ? 2 servings rice x 15 g = 30 g. ? 1 serving corn x 15 g = 15 g. ? 1 serving milk x 15  g = 15 g. ? 1 serving strawberries x 15 g = 15 g. 4. Add together all of the amounts to find the total grams of carbohydrates eaten: ? 30 g + 15 g + 15 g + 15 g = 75 g of carbohydrates total. This information is not intended to replace advice given to you by your health care provider. Make sure you discuss any questions you have with your health care provider. Document Released: 10/23/2005 Document Revised: 05/12/2016 Document Reviewed: 04/05/2016 Elsevier Interactive Patient Education  2018 [name] Maintenance, Female Adopting a healthy lifestyle and getting preventive care can go a long way to promote health and wellness. Talk with your health care provider about what schedule of regular examinations is right for you. This is a good chance for you to check in with your provider about disease prevention and staying healthy. In between checkups, there are plenty of things you can do on your own. Experts have done a lot of research about which lifestyle changes and preventive measures are most likely to keep you healthy. Ask your health care provider for more information. Weight and diet Eat a healthy diet  Be sure to include plenty of vegetables, fruits, low-fat dairy products, and lean protein.  Do not eat a lot of foods high in solid fats, added sugars, or salt.  Get regular exercise. This is one of the most important things you can do for your health. ? Most adults should exercise for at least 150 minutes each week. The exercise should increase your heart rate and make you sweat (moderate-intensity exercise). ? Most adults should also do strengthening exercises at least twice a week. This is in addition to the moderate-intensity exercise.  Maintain a healthy weight  Body mass index (BMI) is a measurement that can be used to identify possible weight problems. It estimates body fat based on height and weight. Your health care provider can help determine your BMI and help you  achieve or maintain a healthy weight.  For females 36 years of age and older: ? A BMI below 18.5 is considered underweight. ? A BMI of 18.5 to 24.9 is normal. ? A BMI of 25 to 29.9 is considered overweight. ? A BMI of 30 and above is considered obese.  Watch levels of cholesterol and blood lipids  You should start having your blood tested for lipids and cholesterol at 36 years of age, then have this test every 5 years.  You may need to have your cholesterol levels checked more often if: ? Your lipid or cholesterol levels are high. ? You are older than 36 years of age. ? You are at high risk for heart disease.  Cancer screening Lung Cancer  Lung cancer screening is recommended for adults 52-25 years old who are at high risk for lung cancer because of a history of smoking.  A  yearly low-dose CT scan of the lungs is recommended for people who: ? Currently smoke. ? Have quit within the past 15 years. ? Have at least a 30-pack-year history of smoking. A pack year is smoking an average of one pack of cigarettes a day for 1 year.  Yearly screening should continue until it has been 15 years since you quit.  Yearly screening should stop if you develop a health problem that would prevent you from having lung cancer treatment.  Breast Cancer  Practice breast self-awareness. This means understanding how your breasts normally appear and feel.  It also means doing regular breast self-exams. Let your health care provider know about any changes, no matter how small.  If you are in your 20s or 30s, you should have a clinical breast exam (CBE) by a health care provider every 1-3 years as part of a regular health exam.  If you are 4 or older, have a CBE every year. Also consider having a breast X-ray (mammogram) every year.  If you have a family history of breast cancer, talk to your health care provider about genetic screening.  If you are at high risk for breast cancer, talk to your health  care provider about having an MRI and a mammogram every year.  Breast cancer gene (BRCA) assessment is recommended for women who have family members with BRCA-related cancers. BRCA-related cancers include: ? Breast. ? Ovarian. ? Tubal. ? Peritoneal cancers.  Results of the assessment will determine the need for genetic counseling and BRCA1 and BRCA2 testing.  Cervical Cancer Your health care provider may recommend that you be screened regularly for cancer of the pelvic organs (ovaries, uterus, and vagina). This screening involves a pelvic examination, including checking for microscopic changes to the surface of your cervix (Pap test). You may be encouraged to have this screening done every 3 years, beginning at age 12.  For women ages 13-65, health care providers may recommend pelvic exams and Pap testing every 3 years, or they may recommend the Pap and pelvic exam, combined with testing for human papilloma virus (HPV), every 5 years. Some types of HPV increase your risk of cervical cancer. Testing for HPV may also be done on women of any age with unclear Pap test results.  Other health care providers may not recommend any screening for nonpregnant women who are considered low risk for pelvic cancer and who do not have symptoms. Ask your health care provider if a screening pelvic exam is right for you.  If you have had past treatment for cervical cancer or a condition that could lead to cancer, you need Pap tests and screening for cancer for at least 20 years after your treatment. If Pap tests have been discontinued, your risk factors (such as having a new sexual partner) need to be reassessed to determine if screening should resume. Some women have medical problems that increase the chance of getting cervical cancer. In these cases, your health care provider may recommend more frequent screening and Pap tests.  Colorectal Cancer  This type of cancer can be detected and often  prevented.  Routine colorectal cancer screening usually begins at 36 years of age and continues through 36 years of age.  Your health care provider may recommend screening at an earlier age if you have risk factors for colon cancer.  Your health care provider may also recommend using home test kits to check for hidden blood in the stool.  A small camera at the end of  a tube can be used to examine your colon directly (sigmoidoscopy or colonoscopy). This is done to check for the earliest forms of colorectal cancer.  Routine screening usually begins at age 45.  Direct examination of the colon should be repeated every 5-10 years through 36 years of age. However, you may need to be screened more often if early forms of precancerous polyps or small growths are found.  Skin Cancer  Check your skin from head to toe regularly.  Tell your health care provider about any new moles or changes in moles, especially if there is a change in a mole's shape or color.  Also tell your health care provider if you have a mole that is larger than the size of a pencil eraser.  Always use sunscreen. Apply sunscreen liberally and repeatedly throughout the day.  Protect yourself by wearing long sleeves, pants, a wide-brimmed hat, and sunglasses whenever you are outside.  Heart disease, diabetes, and high blood pressure  High blood pressure causes heart disease and increases the risk of stroke. High blood pressure is more likely to develop in: ? People who have blood pressure in the high end of the normal range (130-139/85-89 mm Hg). ? People who are overweight or obese. ? People who are African American.  If you are 65-44 years of age, have your blood pressure checked every 3-5 years. If you are 35 years of age or older, have your blood pressure checked every year. You should have your blood pressure measured twice-once when you are at a hospital or clinic, and once when you are not at a hospital or clinic.  Record the average of the two measurements. To check your blood pressure when you are not at a hospital or clinic, you can use: ? An automated blood pressure machine at a pharmacy. ? A home blood pressure monitor.  If you are between 55 years and 4 years old, ask your health care provider if you should take aspirin to prevent strokes.  Have regular diabetes screenings. This involves taking a blood sample to check your fasting blood sugar level. ? If you are at a normal weight and have a low risk for diabetes, have this test once every three years after 36 years of age. ? If you are overweight and have a high risk for diabetes, consider being tested at a younger age or more often. Preventing infection Hepatitis B  If you have a higher risk for hepatitis B, you should be screened for this virus. You are considered at high risk for hepatitis B if: ? You were born in a country where hepatitis B is common. Ask your health care provider which countries are considered high risk. ? Your parents were born in a high-risk country, and you have not been immunized against hepatitis B (hepatitis B vaccine). ? You have HIV or AIDS. ? You use needles to inject street drugs. ? You live with someone who has hepatitis B. ? You have had sex with someone who has hepatitis B. ? You get hemodialysis treatment. ? You take certain medicines for conditions, including cancer, organ transplantation, and autoimmune conditions.  Hepatitis C  Blood testing is recommended for: ? Everyone born from [birth year] through 1965. ? Anyone with known risk factors for hepatitis C.  Sexually transmitted infections (STIs)  You should be screened for sexually transmitted infections (STIs) including gonorrhea and chlamydia if: ? You are sexually active and are younger than 36 years of age. ? You are older than  36 years of age and your health care provider tells you that you are at risk for this type of infection. ? Your sexual  activity has changed since you were last screened and you are at an increased risk for chlamydia or gonorrhea. Ask your health care provider if you are at risk.  If you do not have HIV, but are at risk, it may be recommended that you take a prescription medicine daily to prevent HIV infection. This is called pre-exposure prophylaxis (PrEP). You are considered at risk if: ? You are sexually active and do not regularly use condoms or know the HIV status of your partner(s). ? You take drugs by injection. ? You are sexually active with a partner who has HIV.  Talk with your health care provider about whether you are at high risk of being infected with HIV. If you choose to begin PrEP, you should first be tested for HIV. You should then be tested every 3 months for as long as you are taking PrEP. Pregnancy  If you are premenopausal and you may become pregnant, ask your health care provider about preconception counseling.  If you may become pregnant, take 400 to 800 micrograms (mcg) of folic acid every day.  If you want to prevent pregnancy, talk to your health care provider about birth control (contraception). Osteoporosis and menopause  Osteoporosis is a disease in which the bones lose minerals and strength with aging. This can result in serious bone fractures. Your risk for osteoporosis can be identified using a bone density scan.  If you are 29 years of age or older, or if you are at risk for osteoporosis and fractures, ask your health care provider if you should be screened.  Ask your health care provider whether you should take a calcium or vitamin D supplement to lower your risk for osteoporosis.  Menopause may have certain physical symptoms and risks.  Hormone replacement therapy may reduce some of these symptoms and risks. Talk to your health care provider about whether hormone replacement therapy is right for you. Follow these instructions at home:  Schedule regular health, dental,  and eye exams.  Stay current with your immunizations.  Do not use any tobacco products including cigarettes, chewing tobacco, or electronic cigarettes.  If you are pregnant, do not drink alcohol.  If you are breastfeeding, limit how much and how often you drink alcohol.  Limit alcohol intake to no more than 1 drink per day for nonpregnant women. One drink equals 12 ounces of beer, 5 ounces of wine, or 1 ounces of hard liquor.  Do not use street drugs.  Do not share needles.  Ask your health care provider for help if you need support or information about quitting drugs.  Tell your health care provider if you often feel depressed.  Tell your health care provider if you have ever been abused or do not feel safe at home. This information is not intended to replace advice given to you by your health care provider. Make sure you discuss any questions you have with your health care provider. Document Released: 05/08/2011 Document Revised: 03/30/2016 Document Reviewed: 07/27/2015 Elsevier Interactive Patient Education  [employer].

## 2017-04-09 NOTE — Progress Notes (Signed)
Julie Mcneil 10/17/1981 [id number]    History:    Presents for annual exam.  Regular monthly cycle for 5-6 day/BTL. Normal Pap history. Has had some problems with anxiety in the past doing well. GERD. Obesity. Has seen Dr. Vickey Hugerohmeier for heart palpitations, vertigo and has started on  Zoloft. Has gained 35 pounds in the past 2 years. Has had occasional elevated glucose.  Past medical history, past surgical history, family history and social history were all reviewed and documented in the EPIC chart. Works part-time at [employer]. 3 daughters youngest 755 oldest 7511 all doing well.  ROS:  A ROS was performed and pertinent positives and negatives are included.  Exam:  Vitals:   04/09/17 1633  BP: 130/85  Weight: 261 lb 12.8 oz (118.8 kg)  Height: 5\' 5"  (1.651 m)   Body mass index is 43.57 kg/m.   General appearance:  Normal Thyroid:  Symmetrical, normal in size, without palpable masses or nodularity. Respiratory  Auscultation:  Clear without wheezing or rhonchi Cardiovascular  Auscultation:  Regular rate, without rubs, murmurs or gallops  Edema/varicosities:  Not grossly evident Abdominal  Soft,nontender, without masses, guarding or rebound.  Liver/spleen:  No organomegaly noted  Hernia:  None appreciated  Skin  Inspection:  Grossly normal   Breasts: Examined lying and sitting.     Right: Without masses, retractions, discharge or axillary adenopathy.     Left: Without masses, retractions, discharge or axillary adenopathy. Gentitourinary   Inguinal/mons:  Normal without inguinal adenopathy  External genitalia:  Normal  BUS/Urethra/Skene's glands:  Normal  Vagina:  Normal  Cervix:  Normal  Uterus:  normal in size, shape and contour.  Midline and mobile  Adnexa/parametria:     Rt: Without masses or tenderness.   Lt: Without masses or tenderness.  Anus and perineum: Normal  Digital rectal exam: Normal sphincter tone without palpated masses or  tenderness  Assessment/Plan:  36 y.o. M WF G4 P3 for annual exam with no complaints.  Monthly cycle/BTL GERD Primary care manages labs Morbid obesity Vertigo-neurologist managing  Plan: Reviewed importance of decreasing calories and increasing exercise for weight loss, lower carb diet, Weight Watchers encouraged. Calcium rich diet, vitamin D 1000 daily encouraged. SBE's, annual screening mammogram at 40. Hemoglobin A1c, Pap with HR HPV typing. New screening guidelines reviewed.    Harrington Challengerancy J Sneijder Bernards Swisher Memorial HospitalWHNP, 5:12 PM 04/09/2017

## 2017-04-10 ENCOUNTER — Encounter: Payer: Self-pay | Admitting: Women's Health

## 2017-04-10 LAB — HEMOGLOBIN A1C
Hgb A1c MFr Bld: 5.5 % (ref <5.7)
Mean Plasma Glucose: 111 mg/dL

## 2017-04-11 LAB — PAP, TP IMAGING W/ HPV RNA, RFLX HPV TYPE 16,18/45: HPV mRNA, High Risk: NOT DETECTED

## 2017-04-26 ENCOUNTER — Telehealth: Payer: Self-pay | Admitting: Neurology

## 2017-04-26 ENCOUNTER — Encounter: Payer: Self-pay | Admitting: Neurology

## 2017-04-26 NOTE — Telephone Encounter (Signed)
Mailed letter concerning scheduling sleep study.  °

## 2017-05-01 ENCOUNTER — Encounter: Payer: Self-pay | Admitting: Physical Therapy

## 2017-05-01 NOTE — Therapy (Signed)
[facility] [address] [place], [place], [address] Phone: [phone]   Fax:  [phone]  Patient Details  Name: [name] MRN: [id number] Date of Birth: 01-May-1981 Referring Provider:  No ref. provider found  Encounter Date: 05/01/2017  PHYSICAL THERAPY DISCHARGE SUMMARY  Visits from Start of Care: 4  Current functional level related to goals / functional outcomes: Unable to assess; pt did not return to PT after 4th visit despite multiple phone calls and rescheduling visits.   Remaining deficits: Vertigo, impaired balance, gait   Education / Equipment: HEP  Plan: Patient agrees to discharge.  Patient goals were not met. Patient is being discharged due to not returning since the last visit.  ?????    Raylene Everts, PT, DPT 05/01/17    12:34 PM    [facility] [address] [place], [place], [address] Phone: [phone]   Fax:  [phone]

## 2017-05-02 NOTE — Progress Notes (Signed)
I agree with the d/c  plan as directed by PT .The patient is known to me .   Shareka Casale, MD

## 2017-05-16 ENCOUNTER — Ambulatory Visit (INDEPENDENT_AMBULATORY_CARE_PROVIDER_SITE_OTHER): Payer: BLUE CROSS/BLUE SHIELD | Admitting: Neurology

## 2017-05-16 DIAGNOSIS — G4710 Hypersomnia, unspecified: Secondary | ICD-10-CM

## 2017-05-16 DIAGNOSIS — G4730 Sleep apnea, unspecified: Secondary | ICD-10-CM

## 2017-05-16 DIAGNOSIS — F518 Other sleep disorders not due to a substance or known physiological condition: Secondary | ICD-10-CM

## 2017-05-16 DIAGNOSIS — G4753 Recurrent isolated sleep paralysis: Secondary | ICD-10-CM

## 2017-05-27 NOTE — Procedures (Signed)
PATIENT'S NAME:  Julie Mcneil, Julie A. DOB:      12/20/1980      MR#:    [id number]     DATE OF RECORDING: 05/16/2017 REFERRING M.D.:   PCP : Oliver BarreJames John, MD Study Performed:   Baseline Polysomnogram HISTORY:  This young female patient is returning for an evaluation of possible narcolepsy, based on her excessive degree of daytime sleepiness. Hx: Anxiety, GERD, Obesity, Vertigo, and Snoring.  The patient endorsed the Epworth Sleepiness Scale at 16/24 points.    The patient's weight 260 pounds with a height of 64 (inches), resulting in a BMI of 44.4 kg/m2.  The patient's neck circumference measured 17.5 inches.  CURRENT MEDICATIONS: Valium, Advil, Mycolog, Antivert.   PROCEDURE:  This is a multichannel digital polysomnogram utilizing the SomnoStar 11.2 system.  Electrodes and sensors were applied and monitored per AASM Specifications.   EEG, EOG, Chin and Limb EMG, were sampled at 200 Hz.  ECG, Snore and Nasal Pressure, Thermal Airflow, Respiratory Effort, CPAP Flow and Pressure, Oximetry was sampled at 50 Hz. Digital video and audio were recorded.      BASELINE STUDY  Lights Out was at 20:54 and Lights On at 05:59.  Total recording time (TRT) was 546 minutes, with a total sleep time (TST) of 362 minutes.   The patient's sleep latency was 179 minutes.  REM latency was 57.5 minutes.  The sleep efficiency was 66.3 %.     SLEEP ARCHITECTURE: WASO (Wake after sleep onset) was 9.5 minutes.  There were 6.5 minutes in Stage N1, 201.5 minutes Stage N2, 55.5 minutes Stage N3 and 98.5 minutes in Stage REM.  The percentage of Stage N1 was 1.8%, Stage N2 was 55.7%, Stage N3 was 15.3% and Stage R (REM sleep) was 27.2%.   The arousals were noted as: 14 were spontaneous, 0 were associated with PLMs, and only 3 were associated with respiratory events. Audio and video analysis did not show any abnormal or unusual movements, behaviors, phonations or vocalizations.   EKG was in keeping with normal sinus rhythm  (NSR).  RESPIRATORY ANALYSIS:  There were a total of 6 respiratory events:  4 obstructive apneas, 1 central apnea and 0 mixed apneas with a total of 5 apneas and an apnea index (AI) of 0.8 /hour. There was 1 hypopnea with a hypopnea index of 0.2 /hour. The patient also had 0 respiratory event related arousals (RERAs).      The total APNEA/HYPOPNEA INDEX (AHI) was 1.0/hour and the total RESPIRATORY DISTURBANCE INDEX was 1.0 /hour.  5 events occurred in REM sleep and 2 events in NREM. The REM AHI was 3.0 /hour, versus a non-REM AHI of 0.2. The patient spent 158.5 minutes of total sleep time in the supine position and 204 minutes in non-supine. The supine AHI was 0.4 versus a non-supine AHI of 1.5/hr.  OXYGEN SATURATION & C02:  The Wake baseline 02 saturation was 98%, with the lowest being 90%. Time spent below 89% saturation equaled 0 minutes. Total sleep time under CO2 greater than 40 torr was 0.00 minutes.   PERIODIC LIMB MOVEMENTS:   The patient had a total of 0 Periodic Limb Movements.  The Periodic Limb Movement (PLM) index was 0 and the PLM Arousal index was 0/hour.   Post-study, the patient indicated that sleep was the same as usual.    IMPRESSION:    Valid sleep study for MSLT to follow. The patient experienced a reduced sleep efficiency,  but over 360 minutes of sleep were recorded.  PS:The patient left the sleep lab in the morning and cancelled her MSLT study due to babysitting issues.  The patient rescheduled for 06-13-2017.   I certify that I have reviewed the entire raw data recording prior to the issuance of this report in accordance with the Standards of Accreditation of the American Academy of Sleep Medicine (AASM)      Melvyn Novas, MD     05-25-2017  Diplomat, American Board of Psychiatry and Neurology  Diplomat, American Board of Sleep Medicine Medical Director, [employer] [employer]

## 2017-06-14 ENCOUNTER — Ambulatory Visit (INDEPENDENT_AMBULATORY_CARE_PROVIDER_SITE_OTHER): Payer: BLUE CROSS/BLUE SHIELD | Admitting: Neurology

## 2017-06-14 DIAGNOSIS — G4753 Recurrent isolated sleep paralysis: Secondary | ICD-10-CM

## 2017-06-14 DIAGNOSIS — G4730 Sleep apnea, unspecified: Principal | ICD-10-CM

## 2017-06-14 DIAGNOSIS — F518 Other sleep disorders not due to a substance or known physiological condition: Secondary | ICD-10-CM

## 2017-06-14 DIAGNOSIS — G4710 Hypersomnia, unspecified: Secondary | ICD-10-CM

## 2017-06-26 NOTE — Procedures (Signed)
Demographic Summary           Name: Julie Mcneil, Julie Mcneil Age: 36 BMI: 44.4 Interpret Physician: Melvyn Novas, MD  Pt. Tag:  Ht-IN: 64 CM: 163 Referred By: Oliver Barre MD  Pt. #: [id number] Wt-LB: 260 KG: 118 Tested By: Irene Pap CRT RPSGT  Study ID: [id number] ScoreSet: 2047 Sex: Female Scored By: Irene Pap CRT RPSGT  Bed Tag: [id number] Race: Caucasian Occupation: ---  Indication for PS: ---   MSLT Summary of Naps      Nap 1 Nap 2 Nap 3 Nap 4 Nap 5    Lights Off Time: 07:26 09:25 11:25 13:24 15:24    Lights On Time: 07:48 09:48 11:45 13:44 15:45             Total Record Time: 23 23.5 20.5 21 21.5    Total Sleep Time: 11.5 14.5 0 13 0             Latency to Sleep: 7 8.5 40 4 40             Latency to Stage 1: 7 8.5 0 4 0    Latency to Stage 2: 8.5 10 0 5.5 0    Latency to Stage 3: 0 0 0 0 0    Latency to Stage 4: 0 0 0 0 0    Latency to REM: 0 0 0 0 0             Minutes of REM: 0 0 0 0 0    MSLT Summary of Naps  Mean Sleep Latency to Stage 1: 3.9 Sleepiness Index: 50.3  Mean Sleep Latency to Stage 2: 4.8 Mean Sleep Latency to all Five Naps: 19.9  Mean Sleep Latency to Stage 3: 0 Mean Sleep Latency to First Four Naps: 14.9  Mean Sleep Latency to Stage 4: 0 Mean Sleep Latency to First Three Naps: 18.5  Mean Sleep Latency to REM: 0 Mean Sleep Latency to First Two Naps: 7.8  Number of Naps with REM Sleep: 0     This MSLT followed a HST from 06-13-2017, which resulted in an AHI of 1.5, 15 minutes of SpO2 under 88% and pulse rate variability from 51-237 bpm, average 69 bpm.    Physician Interpretation: A total of 5 nap opportunities were given and the patient fell asleep in nap 1,2 and 4 .  This patient has a normal sleep latency ( 19.9 minutes) and no SREM onset.  This study is not diagnostic for Narcolepsy.    Melvyn Novas, MD    06-26-2017  [facility], Wellsite geologist. Diplomat of the ABSM and ABNP. Accredited by the AASM

## 2017-07-03 ENCOUNTER — Telehealth: Payer: Self-pay | Admitting: Neurology

## 2017-07-03 NOTE — Telephone Encounter (Signed)
Called to discuss the MSLT results. Dr Dohmeier stated that this test could not confirm diagnosis of Narcolepsy. LV with this information and instructed the patient to call back if she had questions.

## 2017-07-11 ENCOUNTER — Encounter: Payer: Self-pay | Admitting: Neurology

## 2017-07-12 ENCOUNTER — Telehealth: Payer: Self-pay | Admitting: Neurology

## 2017-07-12 DIAGNOSIS — R42 Dizziness and giddiness: Secondary | ICD-10-CM

## 2017-07-12 MED ORDER — DIAZEPAM 5 MG PO TABS: 5.0000 mg | ORAL_TABLET | Freq: Four times a day (QID) | ORAL | 1 refills | Status: DC | PRN

## 2017-07-12 NOTE — Telephone Encounter (Signed)
Dear Mrs. Julie Mcneil,    I have unfortunately no neurological answer to your problems. Please allow me to refer to an anxiety treatment specialist.  CD  ===View-only below this line===   ----- Message -----    From: Julie Mcneil    Sent: 07/11/2017 11:33 PM EDT      To: Julie Novasarmen Adra Shepler, MD Subject: Visit Follow-Up Question  Hi,  So I am having a horrible relapse with my dizziness and feeling off balanced.  I have been taking everything prescribed but I'm getting no relief.  It's really stressing me out and I feel like I can not be the mom, wife, employee and friend I need to be. I cant live my life from my bed.  Please help me and help me figure out what's causing this and or what I can take to make this better.  I'm having such horrible anxiety attacks from this it crazy.    Thanks  Julie Mcneil  [phone]

## 2017-07-13 NOTE — Telephone Encounter (Signed)
Baird LyonsCasey , Will you advise me on this Im not seeing a referral or order thanks Annabelle HarmanDana.

## 2017-07-13 NOTE — Telephone Encounter (Signed)
Fax confirmation received for [facility] [phone].

## 2017-07-24 ENCOUNTER — Ambulatory Visit: Payer: BLUE CROSS/BLUE SHIELD | Admitting: Psychology

## 2017-08-07 ENCOUNTER — Other Ambulatory Visit: Payer: Self-pay | Admitting: Neurology

## 2017-09-19 ENCOUNTER — Other Ambulatory Visit: Payer: Self-pay | Admitting: Neurology

## 2017-11-08 ENCOUNTER — Encounter (HOSPITAL_COMMUNITY): Payer: Self-pay | Admitting: Emergency Medicine

## 2017-11-08 ENCOUNTER — Other Ambulatory Visit: Payer: Self-pay

## 2017-11-08 DIAGNOSIS — R42 Dizziness and giddiness: Secondary | ICD-10-CM | POA: Diagnosis not present

## 2017-11-08 DIAGNOSIS — Z79899 Other long term (current) drug therapy: Secondary | ICD-10-CM | POA: Diagnosis not present

## 2017-11-08 DIAGNOSIS — M6281 Muscle weakness (generalized): Secondary | ICD-10-CM | POA: Insufficient documentation

## 2017-11-08 DIAGNOSIS — R55 Syncope and collapse: Secondary | ICD-10-CM | POA: Insufficient documentation

## 2017-11-08 LAB — I-STAT BETA HCG BLOOD, ED (MC, WL, AP ONLY): I-stat hCG, quantitative: <5 m[IU]/mL (ref <5)

## 2017-11-08 LAB — BASIC METABOLIC PANEL
Anion gap: 9 (ref 5–15)
BUN: 14 mg/dL (ref 6–20)
CO2: 27 mmol/L (ref 22–32)
Calcium: 9.2 mg/dL (ref 8.9–10.3)
Chloride: 101 mmol/L (ref 101–111)
Creatinine, Ser: 0.82 mg/dL (ref 0.44–1.00)
GFR calc Af Amer: >60 mL/min (ref >60)
GFR calc non Af Amer: >60 mL/min (ref >60)
Glucose, Bld: 96 mg/dL (ref 65–99)
Potassium: 4.1 mmol/L (ref 3.5–5.1)
Sodium: 137 mmol/L (ref 135–145)

## 2017-11-08 LAB — URINALYSIS, ROUTINE W REFLEX MICROSCOPIC
Bilirubin Urine: NEGATIVE
Glucose, UA: NEGATIVE mg/dL
Ketones, ur: NEGATIVE mg/dL
Leukocytes, UA: NEGATIVE
Nitrite: NEGATIVE
Protein, ur: NEGATIVE mg/dL
Specific Gravity, Urine: 1.018 (ref 1.005–1.030)
pH: 7 (ref 5.0–8.0)

## 2017-11-08 LAB — CBC
HCT: 37.6 % (ref 36.0–46.0)
Hemoglobin: 11.7 g/dL — ABNORMAL LOW (ref 12.0–15.0)
MCH: 27.3 pg (ref 26.0–34.0)
MCHC: 31.1 g/dL (ref 30.0–36.0)
MCV: 87.9 fL (ref 78.0–100.0)
Platelets: 393 10*3/uL (ref 150–400)
RBC: 4.28 MIL/uL (ref 3.87–5.11)
RDW: 13.1 % (ref 11.5–15.5)
WBC: 11.8 10*3/uL — ABNORMAL HIGH (ref 4.0–10.5)

## 2017-11-08 LAB — CBG MONITORING, ED: Glucose-Capillary: 102 mg/dL — ABNORMAL HIGH (ref 65–99)

## 2017-11-08 NOTE — ED Triage Notes (Signed)
BIB EMS from [facility], pt had sudden onset of dizziness, pt had near syncopal episode. Pt has hx of vertigo and anxiety and states this feels similar.

## 2017-11-08 NOTE — ED Triage Notes (Signed)
Pt ambulated to bathroom with steady gait to provide UA, upon returning to triage area pt states she feels like she is going to pass out.

## 2017-11-08 NOTE — ED Notes (Signed)
Pt's CBG result was 102. Informed Tori - RN.

## 2017-11-09 ENCOUNTER — Emergency Department (HOSPITAL_COMMUNITY)
Admission: EM | Admit: 2017-11-09 | Discharge: 2017-11-09 | Disposition: A | Discharge status: Home / Self Care | Payer: BLUE CROSS/BLUE SHIELD | Attending: Emergency Medicine | Admitting: Emergency Medicine

## 2017-11-09 DIAGNOSIS — R531 Weakness: Secondary | ICD-10-CM

## 2017-11-09 DIAGNOSIS — R42 Dizziness and giddiness: Secondary | ICD-10-CM

## 2017-11-09 DIAGNOSIS — R55 Syncope and collapse: Secondary | ICD-10-CM

## 2017-11-09 HISTORY — DX: Dizziness and giddiness: R42

## 2017-11-09 LAB — D-DIMER, QUANTITATIVE: D-Dimer, Quant: 0.38 ug/mL-FEU (ref 0.00–0.50)

## 2017-11-09 MED ORDER — SODIUM CHLORIDE 0.9 % IV BOLUS (SEPSIS)
1000.0000 mL | Freq: Once | INTRAVENOUS | Status: AC
  Administered 2017-11-09: 1000 mL via INTRAVENOUS

## 2017-11-09 NOTE — ED Notes (Signed)
Took pt in w/c to restroom.

## 2017-11-09 NOTE — ED Notes (Signed)
States she just doesn't feel right can't really describe  How she feels. A/o

## 2017-11-09 NOTE — ED Provider Notes (Signed)
[facility] Provider Note   CSN: [id number] Arrival date & time: 11/08/17  1919     History   Chief Complaint Chief Complaint  Patient presents with  . Near Syncope    HPI Julie Mcneil is a 37 y.o. female.  Patient presents with symptoms of lightheadedness, weakness that was generalized and felling like she was going to pass out. This is a recurrent problem, becoming more frequent with 3 episodes in the past 2 weeks. No full syncope. She has been seen by neurology without diagnosis. She denies fever, significant dietary changes, recent illness. No urinary symptoms, nausea, vomiting, chest or abdominal pain, or cough. She endorses SOB without painful respirations. No history of PE/DVT. She is a non-smoke, no substance dependence issues.    The history is provided by the patient. No language interpreter was used.  Near Syncope  Associated symptoms include shortness of breath. Pertinent negatives include no chest pain and no abdominal pain.    Past Medical History:  Diagnosis Date  . Anxiety    Panic attacks  . Cervical dysplasia   . GERD (gastroesophageal reflux disease)   . Obesity   . UTI (urinary tract infection)    recurrent  . Vaginal yeast infection    recurrent  . Vertigo     Patient Active Problem List   Diagnosis Date Noted  . Hyperglycemia 11/28/2016  . Fever 11/28/2016  . Generalized anxiety disorder 02/02/2016  . Acute upper respiratory infection 02/02/2016  . Dysuria 02/02/2016  . Acute sinus infection 11/22/2015  . Anxiety state 11/22/2015  . Fatigue 09/20/2015  . Diarrhea 08/25/2015  . Cervicogenic headache 06/14/2015  . Nonallopathic lesion of cervical region 06/14/2015  . Neck pain on right side 05/24/2015  . Routine general medical examination at a health care facility 02/15/2015  . Abnormal urine odor 12/28/2014  . Severe obesity (BMI >= 40) (HCC) 12/28/2014  . Flank pain 11/17/2014  . Headache  04/03/2014  . Heart palpitations 01/22/2014  . Obesity 03/21/2012    Past Surgical History:  Procedure Laterality Date  . CESAREAN SECTION  1308,[id number],2011   11-28-11  . COLPOSCOPY    . TUBAL LIGATION     2013 C-SECTION    OB History    Gravida Para Term Preterm AB Living   4 3 3  0 1 3   SAB TAB Ectopic Multiple Live Births   0 0 1 0 3       Home Medications    Prior to Admission medications   Medication Sig Start Date End Date Taking? Authorizing Provider  diazepam (VALIUM) 5 MG tablet TAKE 1 TABLET (5MG  TOTAL) BY MOUTH EVERY 6 (SIX) HOURS AS NEEDED FOR ANXIETY 09/20/17   Dohmeier, Porfirio Mylararmen, MD  ibuprofen (ADVIL,MOTRIN) 200 MG tablet Take 200 mg by mouth every 6 (six) hours as needed.    [provider]  meclizine (ANTIVERT) 25 MG tablet Take 25 mg by mouth 3 (three) times daily as needed for dizziness.    [provider]  nystatin-triamcinolone ointment (MYCOLOG) Apply 1 application topically 2 (two) times daily. 02/28/17   Harrington ChallengerYoung, [name] J, NP  sertraline (ZOLOFT) 25 MG tablet Take 1 tablet (25 mg total) by mouth daily. Start after sleep test ! Patient not taking: Reported on 04/09/2017 04/03/17   Dohmeier, Porfirio Mylararmen, MD    Family History Family History  Problem Relation Age of Onset  . Early death Mother   . Heart disease Maternal Grandmother   . Diabetes  Maternal Grandmother   . Cancer Maternal Grandmother        Lymphoma  . Hypertension Paternal Grandmother   . Stroke Paternal Grandmother   . Hypertension Paternal Grandfather   . Anesthesia problems Neg Hx   . Hypotension Neg Hx   . Malignant hyperthermia Neg Hx   . Pseudochol deficiency Neg Hx     Social History Social History   Tobacco Use  . Smoking status: Never Smoker  . Smokeless tobacco: Never Used  Substance Use Topics  . Alcohol use: No    Alcohol/week: 0.0 oz  . Drug use: No     Allergies   Sumatriptan; Levaquin [levofloxacin in d5w]; and Nitrofurantoin monohyd macro   Review of  Systems Review of Systems  Constitutional: Negative for chills and fever.  HENT: Negative.   Eyes: Negative for visual disturbance.  Respiratory: Positive for shortness of breath.   Cardiovascular: Positive for near-syncope. Negative for chest pain, palpitations and leg swelling.  Gastrointestinal: Negative.  Negative for abdominal pain, nausea and vomiting.  Musculoskeletal: Negative.   Skin: Negative.   Neurological: Positive for weakness and light-headedness. Negative for syncope.     Physical Exam Updated Vital Signs BP (!) 138/104 (BP Location: Right Arm)   Pulse 95   Temp 98.6 F (37 C)   Resp 16   Ht 5\' 3"  (1.6 m)   Wt 113.4 kg (250 lb)   LMP 11/06/2017 (Exact Date)   SpO2 99%   BMI 44.29 kg/m   Physical Exam  Constitutional: She is oriented to person, place, and time. She appears well-developed and well-nourished.  HENT:  Head: Normocephalic.  Eyes: Pupils are equal, round, and reactive to light.  No conjunctival pallor.   Neck: Normal range of motion. Neck supple.  Cardiovascular: Normal rate and regular rhythm.  Pulmonary/Chest: Effort normal and breath sounds normal. No stridor. She has no wheezes. She has no rales.  Abdominal: Soft. Bowel sounds are normal. There is no tenderness. There is no rebound and no guarding.  Musculoskeletal: Normal range of motion.  Neurological: She is alert and oriented to person, place, and time. No sensory deficit.  CN's 3-12 grossly intact. Speech is clear and focused. No facial asymmetry. No lateralizing weakness. Reflexes are equal. No deficits of coordination. Ambulatory without imbalance.    Skin: Skin is warm and dry. No rash noted.  Psychiatric: She has a normal mood and affect.     ED Treatments / Results  Labs (all labs ordered are listed, but only abnormal results are displayed) Labs Reviewed  CBC - Abnormal; Notable for the following components:      Result Value   WBC 11.8 (*)    Hemoglobin 11.7 (*)    All  other components within normal limits  URINALYSIS, ROUTINE W REFLEX MICROSCOPIC - Abnormal; Notable for the following components:   Hgb urine dipstick MODERATE (*)    Bacteria, UA MANY (*)    Squamous Epithelial / LPF 0-5 (*)    All other components within normal limits  CBG MONITORING, ED - Abnormal; Notable for the following components:   Glucose-Capillary 102 (*)    All other components within normal limits  BASIC METABOLIC PANEL  D-DIMER, QUANTITATIVE (NOT AT [facility])  I-STAT BETA HCG BLOOD, ED ([facility], [facility], [facility] ONLY)   Results for orders placed or performed during the hospital encounter of 11/09/17  Basic metabolic panel  Result Value Ref Range   Sodium 137 135 - 145 mmol/L   Potassium 4.1 3.5 -  5.1 mmol/L   Chloride 101 101 - 111 mmol/L   CO2 27 22 - 32 mmol/L   Glucose, Bld 96 65 - 99 mg/dL   BUN 14 6 - 20 mg/dL   Creatinine, Ser 1.61 0.44 - 1.00 mg/dL   Calcium 9.2 8.9 - 09.6 mg/dL   GFR calc non Af Amer >60 >60 mL/min   GFR calc Af Amer >60 >60 mL/min   Anion gap 9 5 - 15  CBC  Result Value Ref Range   WBC 11.8 (H) 4.0 - 10.5 K/uL   RBC 4.28 3.87 - 5.11 MIL/uL   Hemoglobin 11.7 (L) 12.0 - 15.0 g/dL   HCT 04.5 40.9 - 81.1 %   MCV 87.9 78.0 - 100.0 fL   MCH 27.3 26.0 - 34.0 pg   MCHC 31.1 30.0 - 36.0 g/dL   RDW 91.4 78.2 - 95.6 %   Platelets 393 150 - 400 K/uL  Urinalysis, Routine w reflex microscopic  Result Value Ref Range   Color, Urine YELLOW YELLOW   APPearance CLEAR CLEAR   Specific Gravity, Urine 1.018 1.005 - 1.030   pH 7.0 5.0 - 8.0   Glucose, UA NEGATIVE NEGATIVE mg/dL   Hgb urine dipstick MODERATE (A) NEGATIVE   Bilirubin Urine NEGATIVE NEGATIVE   Ketones, ur NEGATIVE NEGATIVE mg/dL   Protein, ur NEGATIVE NEGATIVE mg/dL   Nitrite NEGATIVE NEGATIVE   Leukocytes, UA NEGATIVE NEGATIVE   RBC / HPF 6-30 0 - 5 RBC/hpf   WBC, UA 0-5 0 - 5 WBC/hpf   Bacteria, UA MANY (A) NONE SEEN   Squamous Epithelial / LPF 0-5 (A) NONE SEEN   Mucus PRESENT    Hyaline Casts,  UA PRESENT   D-dimer, quantitative (not at [facility])  Result Value Ref Range   D-Dimer, Quant 0.38 0.00 - 0.50 ug/mL-FEU  CBG monitoring, ED  Result Value Ref Range   Glucose-Capillary 102 (H) 65 - 99 mg/dL  I-Stat beta hCG blood, ED  Result Value Ref Range   I-stat hCG, quantitative <5.0 <5 mIU/mL   Comment 3            EKG  EKG Interpretation None       Radiology No results found. No results found.  Procedures Procedures (including critical care time)  Medications Ordered in ED Medications  sodium chloride 0.9 % bolus 1,000 mL (not administered)     Initial Impression / Assessment and Plan / ED Course  I have reviewed the triage vital signs and the nursing notes.  Pertinent labs & imaging results that were available during my care of the patient were reviewed by me and considered in my medical decision making (see chart for details).     Patient presents with recurrent symptoms of near syncope and generalized weakness, sudden onset tonight. She reports having full evaluations for similar episodes by neurology without finding of a diagnosis.   VS stable. No orthostasis. Labs are unremarkable. D-dimer done secondary to symptoms of near syncope and tachycardia on arrival, and is found to be negative. She does become symptomatic when up to the bathroom.   Review of the chart shows a note by Melvyn Novas, neurology, that states no neurologic cause of her symptoms can be found and was recommending follow up for treatment of severe anxiety.   The patient is felt stable for discharge home and will need to follow up with Primary Care for ongoing evaluation and treatment of recurrent symptoms.    Final Clinical Impressions(s) / ED  Diagnoses   Final diagnoses:  None   1. Lightheadedness 2. Near syncope  ED Discharge Orders    None       Elpidio Anis, [place] 11/09/17 1610    Gilda Crease, MD 11/10/17 [id number]

## 2017-11-12 ENCOUNTER — Ambulatory Visit: Payer: BLUE CROSS/BLUE SHIELD | Admitting: Internal Medicine

## 2017-11-12 ENCOUNTER — Encounter: Payer: Self-pay | Admitting: Internal Medicine

## 2017-11-12 VITALS — BP 124/82 | HR 83 | Temp 98.3°F | Ht 63.0 in | Wt 256.0 lb

## 2017-11-12 DIAGNOSIS — Z0001 Encounter for general adult medical examination with abnormal findings: Secondary | ICD-10-CM | POA: Diagnosis not present

## 2017-11-12 DIAGNOSIS — R739 Hyperglycemia, unspecified: Secondary | ICD-10-CM | POA: Diagnosis not present

## 2017-11-12 DIAGNOSIS — R55 Syncope and collapse: Secondary | ICD-10-CM

## 2017-11-12 DIAGNOSIS — R21 Rash and other nonspecific skin eruption: Secondary | ICD-10-CM | POA: Diagnosis not present

## 2017-11-12 DIAGNOSIS — F411 Generalized anxiety disorder: Secondary | ICD-10-CM

## 2017-11-12 MED ORDER — TRIAMCINOLONE ACETONIDE 0.1 % EX CREA: 1.0000 "application " | TOPICAL_CREAM | Freq: Two times a day (BID) | CUTANEOUS | 0 refills | Status: DC

## 2017-11-12 NOTE — Patient Instructions (Addendum)
Please take all new medication as prescribed - the steroid cream  Please continue all other medications as before, and refills have been done if requested.  Please have the pharmacy call with any other refills you may need.  Please continue your efforts at being more active, low cholesterol diet, and weight control.  You are otherwise up to date with prevention measures today.  Please keep your appointments with your specialists as you may have planned  You will be contacted regarding the referral for: Cardiology to consider tilt table testing, and Dermatology  Please return in 6 months, or sooner if needed

## 2017-11-12 NOTE — Assessment & Plan Note (Signed)
Lab Results  Component Value Date   HGBA1C 5.5 04/09/2017  stable overall by history and exam, recent data reviewed with pt, and pt to continue medical treatment as before,  to f/u any worsening symptoms or concerns

## 2017-11-12 NOTE — Assessment & Plan Note (Addendum)
With multiple episodes unexplained dizziness and near syncope that may be postural related without evidence for recent orthostatic hypotension; recent ecg, labs, cxr reviewed, recent MRI head neg April 2018, for cardiology consult - ? POTS or need for tilt table testing  In addition to the time spent performing CPE, I spent an additional 25 minutes face to face,in which greater than 50% of this time was spent in counseling and coordination of care for patient's acute illness as documented, including the differential dx, treatment, further evaluation and other management of near syncope, rash, anxiety and hyperglycemia

## 2017-11-12 NOTE — Assessment & Plan Note (Signed)
I suspect not likely a significant element of her current given her symptoms, though cant be ruled out.

## 2017-11-12 NOTE — Progress Notes (Signed)
Subjective:    Patient ID: Julie Mcneil, female    DOB: 12/10/1980, 37 y.o.   MRN: [id number]  HPI   Here for wellness and f/u;  Overall doing ok;  Pt denies Chest pain, worsening SOB, DOE, wheezing, orthopnea, PND, worsening LE edema.  Pt denies neurological change such as new headache, facial or extremity weakness.  Pt denies polydipsia, polyuria, or low sugar symptoms. Pt states overall good compliance with treatment and medications, good tolerability, and has been trying to follow appropriate diet.  Pt denies worsening depressive symptoms, suicidal ideation or panic. No fever, night sweats, wt loss, loss of appetite, or other constitutional symptoms.  Pt states good ability with ADL's, has low fall risk, home safety reviewed and adequate, no other significant changes in hearing or vision, and only occasionally active with exercise.  Declines flu shot BP Readings from Last 3 Encounters:  11/12/17 124/82  11/09/17 136/80  04/09/17 130/85  Also here with c/o 3 wks recurring near syncope and lost her job as she becomes unable to stand and function at work.  Was seen at ED with particularly bad dizziness episode sudden onset and near passing out, had tunnel vision and blurred vision right eye with severe BP elevation at one point was sbp 240 per EMS. Not found to be orthostatic, and not really better with IVF per pt.  Had called EMS herselft as she was at a [employer] store and so sudden onset.  BP o/w ok before and after it seems, symptoms resolved. Of note, had seen neurology April 2018 with neg Head MRI for vertigo and HA's Also incidentally with marked nontender erythema to mid forehead and glabellar region as well as nose without nasolabial fold involvement.  Past Medical History:  Diagnosis Date  . Anxiety    Panic attacks  . Cervical dysplasia   . GERD (gastroesophageal reflux disease)   . Obesity   . UTI (urinary tract infection)    recurrent  . Vaginal yeast infection    recurrent  .  Vertigo    Past Surgical History:  Procedure Laterality Date  . CESAREAN SECTION  4098,[id number],2011   11-28-11  . COLPOSCOPY    . TUBAL LIGATION     2013 C-SECTION    reports that  has never smoked. she has never used smokeless tobacco. She reports that she does not drink alcohol or use drugs. family history includes Cancer in her maternal grandmother; Diabetes in her maternal grandmother; Early death in her mother; Heart disease in her maternal grandmother; Hypertension in her paternal grandfather and paternal grandmother; Stroke in her paternal grandmother. Allergies  Allergen Reactions  . Sumatriptan Shortness Of Breath    Tight chest  . Levaquin [Levofloxacin In D5w] Nausea Only  . Nitrofurantoin Monohyd Macro Hives and Nausea And Vomiting   Current Outpatient Medications on File Prior to Visit  Medication Sig Dispense Refill  . diazepam (VALIUM) 5 MG tablet TAKE 1 TABLET (5MG  TOTAL) BY MOUTH EVERY 6 (SIX) HOURS AS NEEDED FOR ANXIETY 60 tablet 0  . ibuprofen (ADVIL,MOTRIN) 200 MG tablet Take 200 mg by mouth every 6 (six) hours as needed for moderate pain.     . meclizine (ANTIVERT) 25 MG tablet Take 25 mg by mouth 3 (three) times daily as needed for dizziness.    . Multiple Vitamin (MULTIVITAMIN WITH MINERALS) TABS tablet Take 1 tablet by mouth daily.     No current facility-administered medications on file prior to visit.    Review of  Systems VS noted,  Constitutional: Pt is oriented to person, place, and time. Appears well-developed and well-nourished, in no significant distress and comfortable Head: Normocephalic and atraumatic  Eyes: Conjunctivae and EOM are normal. Pupils are equal, round, and reactive to light Right Ear: External ear normal without discharge Left Ear: External ear normal without discharge Nose: Nose without discharge or deformity Mouth/Throat: Oropharynx is without other ulcerations and moist  Neck: Normal range of motion. Neck supple. No JVD present. No  tracheal deviation present or significant neck LA or mass Cardiovascular: Normal rate, regular rhythm, normal heart sounds and intact distal pulses.   Pulmonary/Chest: WOB normal and breath sounds without rales or wheezing  Abdominal: Soft. Bowel sounds are normal. NT. No HSM  Musculoskeletal: Normal range of motion. Exhibits no edema Lymphadenopathy: Has no other cervical adenopathy.  Neurological: Pt is alert and oriented to person, place, and time. Pt has normal reflexes. No cranial nerve deficit. Motor grossly intact, Gait intact Skin: Skin is warm and dry. No rash noted or new ulcerations Psychiatric:  Has normal mood and affect. Behavior is normal without agitation All other system neg per pt    Objective:   Physical Exam BP 124/82   Pulse 83   Temp 98.3 F (36.8 C) (Oral)   Ht 5\' 3"  (1.6 m)   Wt 256 lb (116.1 kg)   LMP 11/06/2017 (Exact Date)   SpO2 99%   BMI 45.35 kg/m  VS noted,  Constitutional: Pt is oriented to person, place, and time. Appears well-developed and well-nourished, in no significant distress and comfortable Head: Normocephalic and atraumatic  Eyes: Conjunctivae and EOM are normal. Pupils are equal, round, and reactive to light Right Ear: External ear normal without discharge Left Ear: External ear normal without discharge Nose: Nose without discharge or deformity Mouth/Throat: Oropharynx is without other ulcerations and moist  Neck: Normal range of motion. Neck supple. No JVD present. No tracheal deviation present or significant neck LA or mass Cardiovascular: Normal rate, regular rhythm, normal heart sounds and intact distal pulses.   Pulmonary/Chest: WOB normal and breath sounds without rales or wheezing  Abdominal: Soft. Bowel sounds are normal. NT. No HSM  Musculoskeletal: Normal range of motion. Exhibits no edema Lymphadenopathy: Has no other cervical adenopathy.  Neurological: Pt is alert and oriented to person, place, and time. Pt has normal  reflexes. No cranial nerve deficit. Motor grossly intact, Gait intact Skin: Skin is warm and dry. + rash noted to mid forehead ? Acne like but with severe red nasal as well, no new ulcerations Psychiatric:  Has mild nervous mood and affect. Behavior is normal without agitation No other exam findings  Lab Results  Component Value Date   WBC 11.8 (H) 11/08/2017   HGB 11.7 (L) 11/08/2017   HCT 37.6 11/08/2017   PLT 393 11/08/2017   GLUCOSE 96 11/08/2017   CHOL 189 08/25/2013   TRIG 109 08/25/2013   HDL 54 08/25/2013   LDLCALC 113 (H) 08/25/2013   ALT 13 11/21/2016   AST 12 11/21/2016   NA 137 11/08/2017   K 4.1 11/08/2017   CL 101 11/08/2017   CREATININE 0.82 11/08/2017   BUN 14 11/08/2017   CO2 27 11/08/2017   TSH 2.82 11/21/2016   HGBA1C 5.5 04/09/2017       Assessment & Plan:

## 2017-11-12 NOTE — Assessment & Plan Note (Signed)
Etiology unclear, for triam cream, for derm referral

## 2017-11-12 NOTE — Assessment & Plan Note (Signed)

## 2017-11-22 NOTE — Progress Notes (Signed)
Cardiology Office Note   Date:  11/23/2017   ID:  Julie Sanelizabeth A Pakula, DOB 10/14/1981, MRN [id number]  PCP:  Corwin LevinsJohn, Holman Bonsignore W, MD  Cardiologist:   No primary care provider on file. Referring:  Corwin LevinsJohn, Tashara Suder W, MD  Chief Complaint  Patient presents with  . Loss of Consciousness      History of Present Illness: Julie Mcneil is a 37 y.o. female who is referred by Corwin LevinsJohn, Crandall Harvel W, MD for evaluation of dizziness.  She was in the ED earlier this year for this.  I reviewed these records for this visit.     There was no clear etiology for the symptoms.  EKG was normal.  Labs were normal.     She describes episodes where she might be walking through a store.  The patient will go black.  She does not feel palpitations but she will get diaphoretic.  She is never actually frank loss of consciousness and trauma but she feels near syncopal.  She does not describe chest pressure, neck or arm discomfort.  He has no new shortness of breath, PND or orthopnea.  She has had a neurology work up and I reviewed this for this meeting.  Transcranial Doppler was negative.  She has had a normal brain MRI and head CT.  It was suggested that she be treated for anxiety.    From a cardiac standpoint she had a normal echo in 2015.    Past Medical History:  Diagnosis Date  . Anxiety    Panic attacks  . Cervical dysplasia   . GERD (gastroesophageal reflux disease)   . Obesity   . UTI (urinary tract infection)    recurrent  . Vaginal yeast infection    recurrent  . Vertigo     Past Surgical History:  Procedure Laterality Date  . CESAREAN SECTION  [id number],2011   11-28-11  . COLPOSCOPY    . TUBAL LIGATION     2013 C-SECTION     Current Outpatient Medications  Medication Sig Dispense Refill  . diazepam (VALIUM) 5 MG tablet TAKE 1 TABLET (5MG  TOTAL) BY MOUTH EVERY 6 (SIX) HOURS AS NEEDED FOR ANXIETY 60 tablet 0  . ibuprofen (ADVIL,MOTRIN) 200 MG tablet Take 200 mg by mouth every 6 (six) hours as needed for  moderate pain.     . meclizine (ANTIVERT) 25 MG tablet Take 25 mg by mouth 3 (three) times daily as needed for dizziness.    . Multiple Vitamin (MULTIVITAMIN WITH MINERALS) TABS tablet Take 1 tablet by mouth daily.    Marland Kitchen. triamcinolone cream (KENALOG) 0.1 % Apply 1 application topically 2 (two) times daily. 30 g 0   No current facility-administered medications for this visit.     Allergies:   Sumatriptan; Levaquin [levofloxacin in d5w]; and Nitrofurantoin monohyd macro    Social History:  The patient  reports that  has never smoked. she has never used smokeless tobacco. She reports that she does not drink alcohol or use drugs.   Family History:  The patient's family history includes Cancer in her maternal grandmother; Diabetes in her maternal grandmother; Early death in her mother; Heart disease in her maternal grandmother; Hypertension in her paternal grandfather and paternal grandmother; Stroke in her paternal grandmother.    ROS:  Please see the history of present illness.   Otherwise, review of systems are positive for none.   All other systems are reviewed and negative.    PHYSICAL EXAM: VS:  BP 115/77 (BP  Location: Right Arm, Patient Position: Supine, Cuff Size: Large)   Pulse 97   Ht 5\' 3"  (1.6 m)   Wt 257 lb 9.6 oz (116.8 kg)   LMP 11/06/2017 (Exact Date)   BMI 45.63 kg/m  , BMI Body mass index is 45.63 kg/m. GENERAL:  Well appearing HEENT:  Pupils equal round and reactive, fundi not visualized, oral mucosa unremarkable NECK:  No jugular venous distention, waveform within normal limits, carotid upstroke brisk and symmetric, no bruits, no thyromegaly LYMPHATICS:  No cervical, inguinal adenopathy LUNGS:  Clear to auscultation bilaterally BACK:  No CVA tenderness CHEST:  Unremarkable HEART:  PMI not displaced or sustained,S1 and S2 within normal limits, no S3, no S4, no clicks, no rubs, no murmurs ABD:  Flat, positive bowel sounds normal in frequency in pitch, no bruits, no  rebound, no guarding, no midline pulsatile mass, no hepatomegaly, no splenomegaly EXT:  2 plus pulses throughout, no edema, no cyanosis no clubbing SKIN:  No rashes no nodules NEURO:  Cranial nerves II through XII grossly intact, motor grossly intact throughout PSYCH:  Cognitively intact, oriented to person place and time    EKG:  EKG is ordered today. The ekg ordered today demonstrates sinus rhythm, rate 97, axis within normal limits, intervals within normal limits, no acute ST-T wave changes.   Recent Labs: 11/08/2017: BUN 14; Creatinine, Ser 0.82; Hemoglobin 11.7; Platelets 393; Potassium 4.1; Sodium 137    Lipid Panel    Component Value Date/Time   CHOL 189 08/25/2013 0845   TRIG 109 08/25/2013 0845   HDL 54 08/25/2013 0845   CHOLHDL 3.5 08/25/2013 0845   VLDL 22 08/25/2013 0845   LDLCALC 113 (H) 08/25/2013 0845      Wt Readings from Last 3 Encounters:  11/23/17 257 lb 9.6 oz (116.8 kg)  11/12/17 256 lb (116.1 kg)  11/08/17 250 lb (113.4 kg)      Other studies Reviewed: Additional studies/ records that were reviewed today include: ED records, neurology records. Review of the above records demonstrates:  Please see elsewhere in the note.     ASSESSMENT AND PLAN:  DIZZINESS: There is no clear etiology to this at this point.  I do not strongly suspect a dysrhythmia.  However, she needs to wear an event monitor.  ANGELO CAROLL will need a 21 day event monitor.  The patients symptoms necessitate an event monitor.  The symptoms are too infrequent to be identified on a Holter monitor.    There is no suggestion of an orthostatic etiology.  I did agree with neurology recommendations talked to her about treatment of anxiety.     Current medicines are reviewed at length with the patient today.  The patient does not have concerns regarding medicines.  The following changes have been made:  no change  Labs/ tests ordered today include: None  Orders Placed This Encounter    Procedures  . CARDIAC EVENT MONITOR  . EKG 12-Lead     Disposition:   FU with after the event recorder.      Signed, Rollene Rotunda, MD  11/23/2017 4:45 PM    [facility]

## 2017-11-23 ENCOUNTER — Ambulatory Visit: Payer: BLUE CROSS/BLUE SHIELD | Admitting: Cardiology

## 2017-11-23 ENCOUNTER — Encounter: Payer: Self-pay | Admitting: Cardiology

## 2017-11-23 VITALS — BP 115/77 | HR 97 | Ht 63.0 in | Wt 257.6 lb

## 2017-11-23 DIAGNOSIS — R55 Syncope and collapse: Secondary | ICD-10-CM | POA: Diagnosis not present

## 2017-11-23 NOTE — Patient Instructions (Signed)
Medication Instructions:  Continue current medications  If you need a refill on your cardiac medications before your next appointment, please call your pharmacy.  Labwork: None Ordered  Testing/Procedures: Your physician has recommended that you wear an event monitor for 30 days. Event monitors are medical devices that record the heart's electrical activity. Doctors most often us these monitors to diagnose arrhythmias. Arrhythmias are problems with the speed or rhythm of the heartbeat. The monitor is a small, portable device. You can wear one while you do your normal daily activities. This is usually used to diagnose what is causing palpitations/syncope (passing out).   Follow-Up: Your physician wants you to follow-up in: 6 Weeks.    Thank you for choosing [facility]!!

## 2017-11-29 ENCOUNTER — Ambulatory Visit (INDEPENDENT_AMBULATORY_CARE_PROVIDER_SITE_OTHER): Payer: BLUE CROSS/BLUE SHIELD

## 2017-11-29 DIAGNOSIS — R55 Syncope and collapse: Secondary | ICD-10-CM | POA: Diagnosis not present

## 2017-12-04 ENCOUNTER — Encounter: Payer: Self-pay | Admitting: Women's Health

## 2017-12-05 ENCOUNTER — Ambulatory Visit: Payer: Self-pay | Admitting: Women's Health

## 2018-01-03 NOTE — Progress Notes (Signed)
Cardiology Office Note   Date:  01/04/2018   ID:  Julie Sanelizabeth A Co, DOB 09/03/1981, MRN [id number]  PCP:  Corwin LevinsJohn, Rahkim Rabalais W, MD  Cardiologist:   No primary care provider on file. Referring:  Corwin LevinsJohn, Raye Wiens W, MD  Chief Complaint  Patient presents with  . Follow-up      History of Present Illness: Julie Mcneil is a 37 y.o. female who is referred by Corwin LevinsJohn, Anish Vana W, MD for evaluation of dizziness.  She was in the ED earlier this year for this.    There was no clear etiology for the symptoms.  EKG was normal.  Labs were normal.   She has had a neurology work up.  Transcranial Doppler was negative.  She has had a normal brain MRI and head CT.  It was suggested that she be treated for anxiety.    From a cardiac standpoint she had a normal echo in 2015.    She wore an event monitor.  Unfortunately I do not have these results.  She did have palpitations while she was wearing this.  Feels like she has these 2-3 times a day and they may last for half hour at a time.  She does feel the skipping in her chest.  She might get a little dizzy.  She has not had any presyncope or syncope.  She is had no chest pressure, neck or arm discomfort.   Past Medical History:  Diagnosis Date  . Anxiety    Panic attacks  . Cervical dysplasia   . GERD (gastroesophageal reflux disease)   . Obesity   . UTI (urinary tract infection)    recurrent  . Vaginal yeast infection    recurrent  . Vertigo     Past Surgical History:  Procedure Laterality Date  . CESAREAN SECTION  [id number],2011   11-28-11  . COLPOSCOPY    . TUBAL LIGATION     2013 C-SECTION     Current Outpatient Medications  Medication Sig Dispense Refill  . diazepam (VALIUM) 5 MG tablet TAKE 1 TABLET (5MG  TOTAL) BY MOUTH EVERY 6 (SIX) HOURS AS NEEDED FOR ANXIETY 60 tablet 0  . ibuprofen (ADVIL,MOTRIN) 200 MG tablet Take 200 mg by mouth every 6 (six) hours as needed for moderate pain.     . meclizine (ANTIVERT) 25 MG tablet Take 25 mg by mouth 3  (three) times daily as needed for dizziness.    . Multiple Vitamin (MULTIVITAMIN WITH MINERALS) TABS tablet Take 1 tablet by mouth daily.    Marland Kitchen. triamcinolone cream (KENALOG) 0.1 % Apply 1 application topically 2 (two) times daily. 30 g 0   No current facility-administered medications for this visit.     Allergies:   Sumatriptan; Levaquin [levofloxacin in d5w]; and Nitrofurantoin monohyd macro    ROS:  Please see the history of present illness.   Otherwise, review of systems are positive for none.   All other systems are reviewed and negative.    PHYSICAL EXAM: VS:  BP 114/76 (BP Location: Right Arm, Patient Position: Sitting, Cuff Size: Large)   Pulse 91   Ht 5\' 4"  (1.626 m)   Wt 252 lb 3.2 oz (114.4 kg)   LMP 12/26/2017   BMI 43.29 kg/m  , BMI Body mass index is 43.29 kg/m.  GENERAL:  Well appearing NECK:  No jugular venous distention, waveform within normal limits, carotid upstroke brisk and symmetric, no bruits, no thyromegaly LUNGS:  Clear to auscultation bilaterally CHEST:  Unremarkable HEART:  PMI not displaced or sustained,S1 and S2 within normal limits, no S3, no S4, no clicks, no rubs, no murmurs ABD:  Flat, positive bowel sounds normal in frequency in pitch, no bruits, no rebound, no guarding, no midline pulsatile mass, no hepatomegaly, no splenomegaly EXT:  2 plus pulses throughout, no edema, no cyanosis no clubbing   EKG:  EKG is not ordered today.    Recent Labs: 11/08/2017: BUN 14; Creatinine, Ser 0.82; Hemoglobin 11.7; Platelets 393; Potassium 4.1; Sodium 137    Lipid Panel    Component Value Date/Time   CHOL 189 08/25/2013 0845   TRIG 109 08/25/2013 0845   HDL 54 08/25/2013 0845   CHOLHDL 3.5 08/25/2013 0845   VLDL 22 08/25/2013 0845   LDLCALC 113 (H) 08/25/2013 0845      Wt Readings from Last 3 Encounters:  01/04/18 252 lb 3.2 oz (114.4 kg)  11/23/17 257 lb 9.6 oz (116.8 kg)  11/12/17 256 lb (116.1 kg)      Other studies Reviewed: Additional  studies/ records that were reviewed today include: None Review of the above records demonstrates:    ASSESSMENT AND PLAN:  DIZZINESS:    I am going to go ahead and give her 10 mg propranolol as needed 3 times daily to treat her palpitations while I wait for the results of the monitor.  At this point I do not think that further testing is indicated.  This certainly could be a component of anxiety.  She does have stress that she is changing to a new job and has 2 young children.   Current medicines are reviewed at length with the patient today.  The patient does not have concerns regarding medicines.  The following changes have been made:  As above  Labs/ tests ordered today include: None  No orders of the defined types were placed in this encounter.    Disposition:   FU with me as needed.     Signed, Rollene Rotunda, MD  01/04/2018 12:15 PM    [facility]

## 2018-01-04 ENCOUNTER — Ambulatory Visit: Payer: BLUE CROSS/BLUE SHIELD | Admitting: Cardiology

## 2018-01-04 ENCOUNTER — Encounter: Payer: Self-pay | Admitting: Cardiology

## 2018-01-04 VITALS — BP 114/76 | HR 91 | Ht 64.0 in | Wt 252.2 lb

## 2018-01-04 DIAGNOSIS — R002 Palpitations: Secondary | ICD-10-CM

## 2018-01-04 MED ORDER — PROPRANOLOL HCL 10 MG PO TABS: 10.0000 mg | ORAL_TABLET | Freq: Three times a day (TID) | ORAL | 6 refills | Status: DC | PRN

## 2018-01-04 NOTE — Patient Instructions (Signed)
Medication Instructions:  START- Propranolol 10 mg three times a day as needed  If you need a refill on your cardiac medications before your next appointment, please call your pharmacy.  Labwork: None Ordered  Testing/Procedures: None Ordered  Follow-Up: Your physician wants you to follow-up in: 6 Months. You should receive a reminder letter in the mail two months in advance. If you do not receive a letter, please call our office [phone].    Thank you for choosing [facility]!!

## 2018-01-12 ENCOUNTER — Ambulatory Visit: Payer: Self-pay | Admitting: Internal Medicine

## 2018-01-12 DIAGNOSIS — Z0289 Encounter for other administrative examinations: Secondary | ICD-10-CM

## 2018-02-04 ENCOUNTER — Telehealth: Payer: Self-pay | Admitting: *Deleted

## 2018-02-04 ENCOUNTER — Telehealth: Payer: Self-pay | Admitting: Internal Medicine

## 2018-02-04 MED ORDER — OSELTAMIVIR PHOSPHATE 75 MG PO CAPS: 75.0000 mg | ORAL_CAPSULE | Freq: Two times a day (BID) | ORAL | 0 refills | Status: DC

## 2018-02-04 NOTE — Telephone Encounter (Signed)
See below

## 2018-02-04 NOTE — Telephone Encounter (Signed)
Done erx 

## 2018-02-04 NOTE — Telephone Encounter (Signed)
Pt's daughter diagnosed w/ the flu on Saturday; pt now has the same symptoms, onset during night. Fever 100.0-101.0 max; cough, body aches.  Requesting Tamiflu be called in.  If appropriate; [facility], [address]

## 2018-02-04 NOTE — Telephone Encounter (Signed)
Copied from CRM [id number]. Topic: Quick Communication - Rx Refill/Question >> Feb 04, 2018 10:17 AM Alexander BergeronBarksdale, Harvey B wrote: Medication: tamiflu Pt called to ask pcp to call this in, pt's daughter diagnosed on Saturday w/ the flu and the pt has the same symptoms

## 2018-02-04 NOTE — Telephone Encounter (Signed)
Patient called stating her daughter has been diagnosed with flu, pt said she woke up with temp and coughing etc. Asked if tamiflu can be send to pharmacy. I called and told her she will need to contact PCP as GYN office would not treat the flu. Pt verbalized she understood

## 2018-02-06 ENCOUNTER — Ambulatory Visit (INDEPENDENT_AMBULATORY_CARE_PROVIDER_SITE_OTHER)
Admission: RE | Admit: 2018-02-06 | Discharge: 2018-02-06 | Disposition: A | Discharge status: Home / Self Care | Payer: BLUE CROSS/BLUE SHIELD | Source: Ambulatory Visit | Attending: Internal Medicine | Admitting: Internal Medicine

## 2018-02-06 ENCOUNTER — Encounter: Payer: Self-pay | Admitting: Internal Medicine

## 2018-02-06 ENCOUNTER — Ambulatory Visit (INDEPENDENT_AMBULATORY_CARE_PROVIDER_SITE_OTHER): Payer: BLUE CROSS/BLUE SHIELD | Admitting: Internal Medicine

## 2018-02-06 VITALS — BP 110/70 | HR 72 | Temp 99.2°F | Resp 16 | Ht 64.0 in | Wt 259.0 lb

## 2018-02-06 DIAGNOSIS — R05 Cough: Secondary | ICD-10-CM | POA: Diagnosis not present

## 2018-02-06 DIAGNOSIS — R059 Cough, unspecified: Secondary | ICD-10-CM

## 2018-02-06 LAB — POCT EXHALED NITRIC OXIDE: FeNO level (ppb): 12

## 2018-02-06 MED ORDER — HYDROCODONE-HOMATROPINE 5-1.5 MG/5ML PO SYRP: 5.0000 mL | ORAL_SOLUTION | Freq: Three times a day (TID) | ORAL | 0 refills | Status: DC | PRN

## 2018-02-06 NOTE — Progress Notes (Signed)
Subjective:  Patient ID: Max Sane, female    DOB: Jun 21, 1981  Age: 37 y.o. MRN: [id number]  CC: Cough   HPI SAMAYAH NOVINGER presents for a 3-day history of cough that is productive of clear phlegm with fever to 101, sore throat, chills, and fatigue.  She has 3 children that have all been diagnosed with influenza.  She has started a course of Tamiflu.  She only notices wheezing when she lays down in the bed and starts coughing.  She is not taking anything for the cough.  Outpatient Medications Prior to Visit  Medication Sig Dispense Refill  . ibuprofen (ADVIL,MOTRIN) 200 MG tablet Take 200 mg by mouth every 6 (six) hours as needed for moderate pain.     . meclizine (ANTIVERT) 25 MG tablet Take 25 mg by mouth 3 (three) times daily as needed for dizziness.    . Multiple Vitamin (MULTIVITAMIN WITH MINERALS) TABS tablet Take 1 tablet by mouth daily.    Marland Kitchen oseltamivir (TAMIFLU) 75 MG capsule Take 1 capsule (75 mg total) by mouth 2 (two) times daily. 10 capsule 0  . propranolol (INDERAL) 10 MG tablet Take 1 tablet (10 mg total) by mouth 3 (three) times daily as needed. 90 tablet 6  . triamcinolone cream (KENALOG) 0.1 % Apply 1 application topically 2 (two) times daily. 30 g 0  . diazepam (VALIUM) 5 MG tablet TAKE 1 TABLET (5MG  TOTAL) BY MOUTH EVERY 6 (SIX) HOURS AS NEEDED FOR ANXIETY 60 tablet 0   No facility-administered medications prior to visit.     ROS Review of Systems  Constitutional: Positive for chills, fatigue and fever. Negative for appetite change, diaphoresis and unexpected weight change.  HENT: Positive for sore throat. Negative for sinus pressure and trouble swallowing.   Eyes: Negative for visual disturbance.  Respiratory: Positive for cough and wheezing. Negative for chest tightness, shortness of breath and stridor.   Cardiovascular: Negative for chest pain, palpitations and leg swelling.  Gastrointestinal: Negative for abdominal pain, diarrhea, nausea and vomiting.    Endocrine: Negative.   Genitourinary: Negative.  Negative for difficulty urinating and dysuria.  Musculoskeletal: Negative.  Negative for arthralgias, back pain, myalgias and neck pain.  Skin: Negative.  Negative for color change, pallor and rash.  Neurological: Negative.  Negative for dizziness, weakness and light-headedness.  Hematological: Negative for adenopathy. Does not bruise/bleed easily.  Psychiatric/Behavioral: Negative.     Objective:  BP 110/70 (BP Location: Left Arm, Patient Position: Sitting, Cuff Size: Large)   Pulse 72   Temp 99.2 F (37.3 C) (Oral)   Resp 16   Ht 5\' 4"  (1.626 m)   Wt 259 lb (117.5 kg)   LMP 01/23/2018 Comment: s/p BTL  SpO2 98%   BMI 44.46 kg/m   BP Readings from Last 3 Encounters:  02/06/18 110/70  01/04/18 114/76  11/23/17 115/77    Wt Readings from Last 3 Encounters:  02/06/18 259 lb (117.5 kg)  01/04/18 252 lb 3.2 oz (114.4 kg)  11/23/17 257 lb 9.6 oz (116.8 kg)    Physical Exam  Constitutional: She is oriented to person, place, and time. No distress.  HENT:  Mouth/Throat: Oropharynx is clear and moist. No oropharyngeal exudate.  Eyes: Conjunctivae are normal. Left eye exhibits no discharge. No scleral icterus.  Neck: Normal range of motion. Neck supple. No JVD present. No thyromegaly present.  Cardiovascular: Normal rate, regular rhythm and normal heart sounds. Exam reveals no gallop and no friction rub.  No murmur heard. Pulmonary/Chest:  Effort normal and breath sounds normal. No respiratory distress. She has no decreased breath sounds. She has no wheezes. She has no rhonchi. She has no rales.  Abdominal: Soft. Bowel sounds are normal. She exhibits no distension and no mass. There is no tenderness. There is no guarding.  Musculoskeletal: Normal range of motion. She exhibits no edema, tenderness or deformity.  Lymphadenopathy:    She has no cervical adenopathy.  Neurological: She is alert and oriented to person, place, and time.   Skin: Skin is warm and dry. No rash noted. She is not diaphoretic. No erythema. No pallor.  Vitals reviewed.   Lab Results  Component Value Date   WBC 11.8 (H) 11/08/2017   HGB 11.7 (L) 11/08/2017   HCT 37.6 11/08/2017   PLT 393 11/08/2017   GLUCOSE 96 11/08/2017   CHOL 189 08/25/2013   TRIG 109 08/25/2013   HDL 54 08/25/2013   LDLCALC 113 (H) 08/25/2013   ALT 13 11/21/2016   AST 12 11/21/2016   NA 137 11/08/2017   K 4.1 11/08/2017   CL 101 11/08/2017   CREATININE 0.82 11/08/2017   BUN 14 11/08/2017   CO2 27 11/08/2017   TSH 2.82 11/21/2016   HGBA1C 5.5 04/09/2017    No results found.   Assessment & Plan:   Lanora Manislizabeth was seen today for cough.  Diagnoses and all orders for this visit:  Cough- I do not hear any wheezing today and her FeNO score is not elevated so I do not think she has an allergic or eosinophilic condition that requires steroid therapy.  I will check a chest x-ray to screen for pneumonia.  She will continue the Tamiflu for influenza.  I have offered her Hycodan as needed for the cough. -     DG Chest 2 View; Future -     HYDROcodone-homatropine (HYCODAN) 5-1.5 MG/5ML syrup; Take 5 mLs by mouth every 8 (eight) hours as needed for cough. -     POCT EXHALED NITRIC OXIDE   I have discontinued Lanora ManisElizabeth A. Sahli's diazepam. I am also having her start on HYDROcodone-homatropine. Additionally, I am having her maintain her ibuprofen, meclizine, multivitamin with minerals, triamcinolone cream, propranolol, and oseltamivir.  Meds ordered this encounter  Medications  . HYDROcodone-homatropine (HYCODAN) 5-1.5 MG/5ML syrup    Sig: Take 5 mLs by mouth every 8 (eight) hours as needed for cough.    Dispense:  120 mL    Refill:  0     Follow-up: No follow-ups on file.  Sanda Lingerhomas Laqueisha Catalina, MD

## 2018-02-07 ENCOUNTER — Encounter: Payer: Self-pay | Admitting: Internal Medicine

## 2018-02-07 NOTE — Patient Instructions (Signed)
Cough, Adult  Coughing is a reflex that clears your throat and your airways. Coughing helps to heal and protect your lungs. It is normal to cough occasionally, but a cough that happens with other symptoms or lasts a long time may be a sign of a condition that needs treatment. A cough may last only 2-3 weeks (acute), or it may last longer than 8 weeks (chronic).  What are the causes?  Coughing is commonly caused by:   Breathing in substances that irritate your lungs.   A viral or bacterial respiratory infection.   Allergies.   Asthma.   Postnasal drip.   Smoking.   Acid backing up from the stomach into the esophagus (gastroesophageal reflux).   Certain medicines.   Chronic lung problems, including COPD (or rarely, lung cancer).   Other medical conditions such as heart failure.    Follow these instructions at home:  Pay attention to any changes in your symptoms. Take these actions to help with your discomfort:   Take medicines only as told by your health care provider.  ? If you were prescribed an antibiotic medicine, take it as told by your health care provider. Do not stop taking the antibiotic even if you start to feel better.  ? Talk with your health care provider before you take a cough suppressant medicine.   Drink enough fluid to keep your urine clear or pale yellow.   If the air is dry, use a cold steam vaporizer or humidifier in your bedroom or your home to help loosen secretions.   Avoid anything that causes you to cough at work or at home.   If your cough is worse at night, try sleeping in a semi-upright position.   Avoid cigarette smoke. If you smoke, quit smoking. If you need help quitting, ask your health care provider.   Avoid caffeine.   Avoid alcohol.   Rest as needed.    Contact a health care provider if:   You have new symptoms.   You cough up pus.   Your cough does not get better after 2-3 weeks, or your cough gets worse.   You cannot control your cough with suppressant  medicines and you are losing sleep.   You develop pain that is getting worse or pain that is not controlled with pain medicines.   You have a fever.   You have unexplained weight loss.   You have night sweats.  Get help right away if:   You cough up blood.   You have difficulty breathing.   Your heartbeat is very fast.  This information is not intended to replace advice given to you by your health care provider. Make sure you discuss any questions you have with your health care provider.  Document Released: 04/21/2011 Document Revised: 03/30/2016 Document Reviewed: 12/30/2014  Elsevier Interactive Patient Education  2018 [employer]

## 2018-02-14 ENCOUNTER — Ambulatory Visit: Payer: Self-pay | Admitting: Internal Medicine

## 2018-02-14 DIAGNOSIS — Z0289 Encounter for other administrative examinations: Secondary | ICD-10-CM

## 2018-03-10 ENCOUNTER — Encounter (HOSPITAL_COMMUNITY): Payer: Self-pay

## 2018-03-10 ENCOUNTER — Emergency Department (HOSPITAL_COMMUNITY): Payer: Self-pay

## 2018-03-10 ENCOUNTER — Other Ambulatory Visit: Payer: Self-pay

## 2018-03-10 ENCOUNTER — Emergency Department (HOSPITAL_COMMUNITY)
Admission: EM | Admit: 2018-03-10 | Discharge: 2018-03-10 | Disposition: A | Discharge status: Home / Self Care | Payer: Self-pay | Attending: Emergency Medicine | Admitting: Emergency Medicine

## 2018-03-10 DIAGNOSIS — Z79899 Other long term (current) drug therapy: Secondary | ICD-10-CM | POA: Insufficient documentation

## 2018-03-10 DIAGNOSIS — R42 Dizziness and giddiness: Secondary | ICD-10-CM | POA: Insufficient documentation

## 2018-03-10 DIAGNOSIS — R51 Headache: Secondary | ICD-10-CM | POA: Insufficient documentation

## 2018-03-10 DIAGNOSIS — R55 Syncope and collapse: Secondary | ICD-10-CM | POA: Insufficient documentation

## 2018-03-10 LAB — CBC WITH DIFFERENTIAL/PLATELET
Basophils Absolute: 0 10*3/uL (ref 0.0–0.1)
Basophils Relative: 0 %
Eosinophils Absolute: 0.3 10*3/uL (ref 0.0–0.7)
Eosinophils Relative: 3 %
HCT: 40.1 % (ref 36.0–46.0)
Hemoglobin: 12.9 g/dL (ref 12.0–15.0)
Lymphocytes Relative: 24 %
Lymphs Abs: 2.6 10*3/uL (ref 0.7–4.0)
MCH: 28.2 pg (ref 26.0–34.0)
MCHC: 32.2 g/dL (ref 30.0–36.0)
MCV: 87.6 fL (ref 78.0–100.0)
Monocytes Absolute: 0.5 10*3/uL (ref 0.1–1.0)
Monocytes Relative: 5 %
Neutro Abs: 7.5 10*3/uL (ref 1.7–7.7)
Neutrophils Relative %: 68 %
Platelets: 289 10*3/uL (ref 150–400)
RBC: 4.58 MIL/uL (ref 3.87–5.11)
RDW: 13.8 % (ref 11.5–15.5)
WBC: 10.9 10*3/uL — ABNORMAL HIGH (ref 4.0–10.5)

## 2018-03-10 LAB — BASIC METABOLIC PANEL
Anion gap: 11 (ref 5–15)
BUN: 13 mg/dL (ref 6–20)
CO2: 21 mmol/L — ABNORMAL LOW (ref 22–32)
Calcium: 9 mg/dL (ref 8.9–10.3)
Chloride: 107 mmol/L (ref 101–111)
Creatinine, Ser: 0.76 mg/dL (ref 0.44–1.00)
GFR calc Af Amer: >60 mL/min (ref >60)
GFR calc non Af Amer: >60 mL/min (ref >60)
Glucose, Bld: 106 mg/dL — ABNORMAL HIGH (ref 65–99)
Potassium: 4 mmol/L (ref 3.5–5.1)
Sodium: 139 mmol/L (ref 135–145)

## 2018-03-10 LAB — POC URINE PREG, ED: Preg Test, Ur: NEGATIVE

## 2018-03-10 MED ORDER — METOCLOPRAMIDE HCL 5 MG/ML IJ SOLN
10.0000 mg | Freq: Once | INTRAMUSCULAR | Status: AC
  Administered 2018-03-10: 10 mg via INTRAVENOUS
  Filled 2018-03-10: qty 2

## 2018-03-10 MED ORDER — PROCHLORPERAZINE MALEATE 5 MG PO TABS: 5.0000 mg | ORAL_TABLET | Freq: Three times a day (TID) | ORAL | 0 refills | Status: DC | PRN

## 2018-03-10 MED ORDER — DIPHENHYDRAMINE HCL 50 MG/ML IJ SOLN
50.0000 mg | Freq: Once | INTRAMUSCULAR | Status: AC
  Administered 2018-03-10: 50 mg via INTRAVENOUS
  Filled 2018-03-10: qty 1

## 2018-03-10 MED ORDER — MECLIZINE HCL 25 MG PO TABS: 25.0000 mg | ORAL_TABLET | Freq: Every day | ORAL | 0 refills | Status: DC | PRN

## 2018-03-10 MED ORDER — KETOROLAC TROMETHAMINE 30 MG/ML IJ SOLN
30.0000 mg | Freq: Once | INTRAMUSCULAR | Status: AC
Start: 2018-03-10 — End: 2018-03-10
  Administered 2018-03-10: 30 mg via INTRAVENOUS
  Filled 2018-03-10: qty 1

## 2018-03-10 MED ORDER — SODIUM CHLORIDE 0.9 % IV BOLUS
1000.0000 mL | Freq: Once | INTRAVENOUS | Status: AC
  Administered 2018-03-10: 1000 mL via INTRAVENOUS

## 2018-03-10 MED ORDER — DEXAMETHASONE SODIUM PHOSPHATE 10 MG/ML IJ SOLN
10.0000 mg | Freq: Once | INTRAMUSCULAR | Status: AC
  Administered 2018-03-10: 10 mg via INTRAVENOUS
  Filled 2018-03-10: qty 1

## 2018-03-10 MED ORDER — MECLIZINE HCL 25 MG PO TABS
25.0000 mg | ORAL_TABLET | Freq: Once | ORAL | Status: AC
  Administered 2018-03-10: 25 mg via ORAL
  Filled 2018-03-10: qty 1

## 2018-03-10 NOTE — Discharge Instructions (Addendum)
It was a pleasure to take care of you Julie Mcneil. During your stay we managed your dizziness. It is very unlikely that you have a stroke. Please follow up with Neurology. Please use meclizine for dizziness, compazine for nausea, and use motrin over the counter for any pain.

## 2018-03-10 NOTE — ED Notes (Signed)
Bed: WA01 Expected date:  Expected time:  Means of arrival:  Comments: 37 yo dizziness, HA

## 2018-03-10 NOTE — ED Notes (Signed)
Pt updated on plan of care

## 2018-03-10 NOTE — ED Notes (Signed)
Pt reports she is unable to tolerate orthostatic vitals at this time. Pt states " I feel to weird. Im dizzy"

## 2018-03-10 NOTE — ED Notes (Signed)
Upon arrival pt reports she needs to void and have a BM immediately. Pt assisted to bedside toliet. Pt states " I dont think I can walk to the bathroom"

## 2018-03-10 NOTE — ED Notes (Signed)
When administering the benadryl IV, pt states " I can feel that going in. Oh my god I feel weird. I cant breath. Please help me." Pts O2 100% on room air. Pts HR increased from 90-115 bpm. Pts HR returned to 95 after reassurance. Pt reassured that she was okay and her oxygen sat was 100%. Pts BP remains at baseline. Pt calming after much reassurance. This RN remained at bedside until pt felt more comfortable. Pt states "my head just still feels weird and there is a lot of pressure." Pt more calm at this time. Will continue to monitor.

## 2018-03-10 NOTE — ED Provider Notes (Signed)
[facility]-EMERGENCY DEPT Provider Note   CSN: [id number] Arrival date & time: 03/10/18  4098  History   Chief Complaint Chief Complaint  Patient presents with  . Dizziness    HPI Julie Mcneil is a 37 y.o. female. With history of anxiety, obesity, uti, recurrent yeast infections, and vertigo who presents with headache, dizziness, and nausea. The patient states that she was getting out of bed this morning she noticed that everything went black for a few seconds and she couldn't see. Subsequently she felt very dizzy and as if the room was spinning around her. She has history of vertigo, but feels that the her current symptoms are different from how she felt previously.   The patient also notes that she has a headache that stared 30 minutes prior to coming to ED. The headache is on the posterior part of her head and has now localized to right side. She states that she has blurry vision in right eye.  She has some nausea, but denies any ear fullness, ear ringing, vomiting, abdominal pain, or diarrhea.   Past Medical History:  Diagnosis Date  . Anxiety    Panic attacks  . Cervical dysplasia   . GERD (gastroesophageal reflux disease)   . Obesity   . UTI (urinary tract infection)    recurrent  . Vaginal yeast infection    recurrent  . Vertigo     Patient Active Problem List   Diagnosis Date Noted  . Cough 02/06/2018  . Recurrent syncope 11/12/2017  . Rash 11/12/2017  . Hyperglycemia 11/28/2016  . Fever 11/28/2016  . Generalized anxiety disorder 02/02/2016  . Dysuria 02/02/2016  . Acute sinus infection 11/22/2015  . Anxiety state 11/22/2015  . Fatigue 09/20/2015  . Diarrhea 08/25/2015  . Cervicogenic headache 06/14/2015  . Nonallopathic lesion of cervical region 06/14/2015  . Neck pain on right side 05/24/2015  . Encounter for well adult exam with abnormal findings 02/15/2015  . Abnormal urine odor 12/28/2014  . Severe obesity (BMI >= 40) (HCC)  12/28/2014  . Flank pain 11/17/2014  . Headache 04/03/2014  . Heart palpitations 01/22/2014  . Obesity 03/21/2012    Past Surgical History:  Procedure Laterality Date  . CESAREAN SECTION  1191,4782   11-28-11  . COLPOSCOPY    . TUBAL LIGATION     2013 C-SECTION     OB History    Gravida  4   Para  3   Term  3   Preterm  0   AB  1   Living  3     SAB  0   TAB  0   Ectopic  1   Multiple  0   Live Births  3            Home Medications    Prior to Admission medications   Medication Sig Start Date End Date Taking? Authorizing Provider  HYDROcodone-homatropine (HYCODAN) 5-1.5 MG/5ML syrup Take 5 mLs by mouth every 8 (eight) hours as needed for cough. 02/06/18   Etta Grandchild, MD  ibuprofen (ADVIL,MOTRIN) 200 MG tablet Take 200 mg by mouth every 6 (six) hours as needed for moderate pain.     [provider]  meclizine (ANTIVERT) 25 MG tablet Take 25 mg by mouth 3 (three) times daily as needed for dizziness.    [provider]  Multiple Vitamin (MULTIVITAMIN WITH MINERALS) TABS tablet Take 1 tablet by mouth daily.    [provider]  oseltamivir (TAMIFLU)  75 MG capsule Take 1 capsule (75 mg total) by mouth 2 (two) times daily. 02/04/18   Corwin Levins, MD  propranolol (INDERAL) 10 MG tablet Take 1 tablet (10 mg total) by mouth 3 (three) times daily as needed. 01/04/18   Rollene Rotunda, MD  triamcinolone cream (KENALOG) 0.1 % Apply 1 application topically 2 (two) times daily. 11/12/17 11/12/18  Corwin Levins, MD    Family History Family History  Problem Relation Age of Onset  . Early death Mother        Tax inspector  . Heart disease Maternal Grandmother   . Diabetes Maternal Grandmother   . Cancer Maternal Grandmother        Lymphoma  . Hypertension Paternal Grandmother   . Stroke Paternal Grandmother   . Hypertension Paternal Grandfather   . Anesthesia problems Neg Hx   . Hypotension Neg Hx   . Malignant hyperthermia Neg Hx   .  Pseudochol deficiency Neg Hx     Social History Social History   Tobacco Use  . Smoking status: Never Smoker  . Smokeless tobacco: Never Used  Substance Use Topics  . Alcohol use: No    Alcohol/week: 0.0 oz  . Drug use: No     Allergies   Sumatriptan; Levaquin [levofloxacin in d5w]; and Nitrofurantoin monohyd macro   Review of Systems Review of Systems  HENT: Negative for ear discharge and ear pain.   Gastrointestinal: Positive for nausea. Negative for abdominal pain, diarrhea and vomiting.  Neurological: Positive for dizziness, syncope and light-headedness.   Physical Exam Updated Vital Signs SpO2 100%   Physical Exam  Constitutional: She is oriented to person, place, and time. She appears well-developed and well-nourished. No distress.  HENT:  Head: Normocephalic and atraumatic.  Eyes: Pupils are equal, round, and reactive to light. Conjunctivae and EOM are normal.  Cardiovascular: Normal rate, regular rhythm, normal heart sounds and intact distal pulses.  Pulmonary/Chest: Effort normal and breath sounds normal. No stridor. No respiratory distress.  Abdominal: Soft. Bowel sounds are normal. She exhibits no distension. There is no tenderness.  Musculoskeletal: She exhibits no edema.  Neurological: She is alert and oriented to person, place, and time. No cranial nerve deficit or sensory deficit. She exhibits normal muscle tone.  5/5 strength in bilaterally upper and lower extremity  Skin: She is not diaphoretic. No erythema.  Psychiatric: She has a normal mood and affect. Her behavior is normal. Judgment and thought content normal.     ED Treatments / Results  Labs (all labs ordered are listed, but only abnormal results are displayed) Labs Reviewed - No data to display  EKG None  Radiology No results found.  Procedures Procedures (including critical care time)  Medications Ordered in ED Medications - No data to display   Initial Impression / Assessment  and Plan / ED Course  I have reviewed the triage vital signs and the nursing notes.  Pertinent labs & imaging results that were available during my care of the patient were reviewed by me and considered in my medical decision making (see chart for details).  Dizziness  The patient presents with dizziness/lightheadedness and headache that is most prevalent on right side. The patient's symptoms are most consistent with atypical migraine vs vertigo.   CT head wo contrast was negative for any intracranial abnormalities. MR brain and MRV head without contrast returned normal without any acute intracranial abnormalities. The dural sinuses are intact.   -IV reglan   -IV toradol , benadryl   -1L NS bolus -BMP no significant electrolyte abnormalities -Cbc mild leukocytosis of 10.9 -EKG sinus rhythm without any st or t wave abnormalities -Recommended outpatient follow up with neurology   Final Clinical Impressions(s) / ED Diagnoses   Final diagnoses:  None    ED Discharge Orders    None       Lorenso Courier, MD 03/10/18 1510    Maia Plan, MD 03/10/18 [phone]

## 2018-03-10 NOTE — ED Triage Notes (Signed)
Pt arrives via [facility] from home. Pt reports that she woke this morning with a headache, dizziness and nausea. Pt reports that the symptoms only occur upon going from a lying to sitting position. Pt reports a hx of vertigo but reports that this feels different.

## 2018-03-15 ENCOUNTER — Encounter: Payer: Self-pay | Admitting: Neurology

## 2018-03-18 ENCOUNTER — Other Ambulatory Visit: Payer: Self-pay | Admitting: Neurology

## 2018-03-18 DIAGNOSIS — R42 Dizziness and giddiness: Secondary | ICD-10-CM

## 2018-03-18 DIAGNOSIS — H81313 Aural vertigo, bilateral: Secondary | ICD-10-CM

## 2018-03-18 DIAGNOSIS — H81319 Aural vertigo, unspecified ear: Secondary | ICD-10-CM

## 2018-03-20 ENCOUNTER — Ambulatory Visit: Payer: Self-pay | Attending: Neurology | Admitting: Rehabilitative and Restorative Service Providers"

## 2018-03-20 ENCOUNTER — Encounter: Payer: Self-pay | Admitting: Rehabilitative and Restorative Service Providers"

## 2018-03-20 DIAGNOSIS — R2689 Other abnormalities of gait and mobility: Secondary | ICD-10-CM

## 2018-03-20 DIAGNOSIS — R42 Dizziness and giddiness: Secondary | ICD-10-CM

## 2018-03-20 DIAGNOSIS — R2681 Unsteadiness on feet: Secondary | ICD-10-CM

## 2018-03-20 DIAGNOSIS — H8112 Benign paroxysmal vertigo, left ear: Secondary | ICD-10-CM

## 2018-03-20 NOTE — Therapy (Signed)
[facility] [address] [place], [place], [address] Phone: [phone]   Fax:  [phone]  Physical Therapy Evaluation  Patient Details  Name: Julie Mcneil MRN: [id number] Date of Birth: 1981/09/11 Referring Provider: Melvyn Novas, MD   Encounter Date: 03/20/2018  PT End of Session - 03/20/18 1259    Visit Number  1    Number of Visits  8    Date for PT Re-Evaluation  04/19/18    Authorization Type  BC[employer]     PT Start Time  0935    PT Stop Time  1020    PT Time Calculation (min)  45 min    Activity Tolerance  Patient tolerated treatment well rested between Epley's due to vertigo    Behavior During Therapy  [facility] for tasks assessed/performed       Past Medical History:  Diagnosis Date  . Anxiety    Panic attacks  . Cervical dysplasia   . GERD (gastroesophageal reflux disease)   . Obesity   . UTI (urinary tract infection)    recurrent  . Vaginal yeast infection    recurrent  . Vertigo     Past Surgical History:  Procedure Laterality Date  . CESAREAN SECTION  6962,9528   11-28-11  . COLPOSCOPY    . TUBAL LIGATION     2013 C-SECTION    There were no vitals filed for this visit.   Subjective Assessment - 03/20/18 0934    Subjective  The patient reports a h/o intermittent vertigo described as "off balance".  She has had treatment in our clinic before, but she notes this is different.  She reports on 03/10/18 she had a sensation of being "pushed back in the bed" and room spinning.  She reports she is still experiencing this sensation, but it is improving.  She notes she still cannot roll onto her left side and home epley's is not working.      Pertinent History  has been treated for migraines    Patient Stated Goals  Get rid of vertigo.  Be able to take care of kids at night. Shower by myself (I can't look up or down because she feels like she could fall).     Currently in Pain?  No/denies          [facility] PT Assessment - 03/20/18 0941      Assessment   Medical Diagnosis  Vertigo    Referring Provider  Melvyn Novas, MD    Onset Date/Surgical Date  03/10/18    Prior Therapy  known to our clinic from PT last year      Precautions   Precautions  Fall      Restrictions   Weight Bearing Restrictions  No      Balance Screen   Has the patient fallen in the past 6 months  No      Home Environment   Living Environment  Private residence    Living Arrangements  Spouse/significant other;Children      Prior Function   Level of Independence  Independent    Vocation  Full time employment    Vocation Requirements  sitting for office work    Leisure  Kids           Vestibular Assessment - 03/20/18 0942      Vestibular Assessment   General Observation  The patient walked into clinic at slow pace.      Symptom Behavior   Type of  Dizziness  Spinning    Frequency of Dizziness  daily    Duration of Dizziness  seconds to a minute    Aggravating Factors  Rolling to left    Relieving Factors  Head stationary      Occulomotor Exam   Occulomotor Alignment  Normal    Spontaneous  Absent    Gaze-induced  Absent    Smooth Pursuits  Intact      Vestibulo-Occular Reflex   VOR 1 Head Only (x 1 viewing)  Slow VOR provokes a sensation of movement and patient stops after 3 reps.    Comment  Head impulse test is positive bilaterally for small amplitude refixation saccade.      Positional Testing   Dix-Hallpike  Dix-Hallpike Right;Dix-Hallpike Left    Sidelying Test  Sidelying Right;Sidelying Left    Horizontal Canal Testing  Horizontal Canal Right;Horizontal Canal Left      Dix-Hallpike Right   Dix-Hallpike Right Duration  0    Dix-Hallpike Right Symptoms  No nystagmus      Dix-Hallpike Left   Dix-Hallpike Left Duration  0    Dix-Hallpike Left Symptoms  No nystagmus      Sidelying Right   Sidelying Right Duration  0    Sidelying Right Symptoms  No nystagmus       Sidelying Left   Sidelying Left Duration  On first rep, no dizziness.  When beginning habituation HEP for L sidelying due to mild c/o dizzy sensation, patient has spinning episode with nystagmus viewed *patient closes eyes, but PT able to lift lid to view.    Sidelying Left Symptoms  Upbeat, left rotatory nystagmus      Horizontal Canal Right   Horizontal Canal Right Duration  0    Horizontal Canal Right Symptoms  Normal      Horizontal Canal Left   Horizontal Canal Left Duration  0    Horizontal Canal Left Symptoms  Normal          Objective measurements completed on examination: See above findings.       Vestibular Treatment/Exercise - 03/20/18 1005      Vestibular Treatment/Exercise   Vestibular Treatment Provided  Gaze;Habituation;Canalith Repositioning    Canalith Repositioning  Epley Manuever Left    Habituation Exercises  Brandt Daroff;Standing Vertical Head Turns;Standing Horizontal Head Turns    Gaze Exercises  X1 Viewing Horizontal;X1 Viewing Vertical       EPLEY MANUEVER LEFT   Number of Reps   2    Overall Response   Symptoms Worsened     RESPONSE DETAILS LEFT  After first rep of L Epley's maneuver, PT retested and patient had a downbeat nystagmus with greater subjective reports.  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      Austin Miles   Number of Reps   1    Symptom Description   *initial positional testing did not reveal nystagmus.  Noted provocation of symptoms when nose pointed to the ceiling in L sidelying (beyond 45 degrees of rotation.      Standing Horizontal Head Turns   Number of Reps   5    Symptom Description   patient performed corner standing.      Standing Vertical Head Turns   Number of Reps   5    Symptom Description  patient performed corner standing with only minimal symptoms reporting "I can't believe I'm able to do this today"      X1 Viewing Horizontal   Foot Position  seated, then standing    Reps  15    Comments  Patient notes mild sensation of dizziness.      X1 Viewing Vertical   Foot Position  seated, then standing    Reps  15    Comments  Patient notes mild sensation of dizziness.            PT Education - 03/20/18 1258    Education provided  Yes    Education Details  PT began instruction in gaze, balance activities and habituation.  However, did not provide HEP due to provocation of symptoms.    Person(s) Educated  Patient    Methods  Explanation;Demonstration    Comprehension  Verbalized understanding;Returned demonstration          PT Long Term Goals - 03/20/18 1305      PT LONG TERM GOAL #1   Title  The patient will be indep with HEP for habituation, gaze x 1 adapatation, and balance.    Time  4    Period  Weeks    Target Date  04/19/18      PT LONG TERM GOAL #2   Title  The patient will have negative positional testing indicating resolution of BPPV symptoms.    Time  4    Period  Weeks    Target Date  04/19/18      PT LONG TERM GOAL #3   Title  The patient will return to driving to improve IADL independence.    Time  4    Period  Weeks    Target Date  04/19/18      PT LONG TERM GOAL #4   Title  The patient will subjectively report being able to shower independently without c/o dizziness.    Time  4    Period  Weeks    Target Date  04/19/18             Plan - 03/20/18 1308    Clinical Impression Statement  The patient is a 37 yo female known to our clinic from vestibular rehab in 2018.  She has a new onset of BPPV 03/10/2018 that appears L posterior canal (although with unusual variant as initially had upbeating, then downbeating nystagmus).  The patient has modified all ADLs/IADLs including work activities, driving, showering, and household activities  due to dizziness.  She also notes more unsteady with walking and standing tasks.  PT to address impairments to improve mobility and return to prior status.     History and Personal Factors relevant to plan of care:  obesity, HA, h/o vertigo, anxiety    Clinical Presentation  Stable    Clinical Presentation due to:  Low decision making due to # of body systems addressed    Clinical Decision Making  Low    Rehab Potential  Good    Clinical Impairments Affecting Rehab Potential  supportive family, motivation    PT Frequency  2x / week + eval    PT Duration  4 weeks    PT Treatment/Interventions  ADLs/Self Care Home Management;Canalith Repostioning;Vestibular;Therapeutic exercise;Therapeutic activities;Functional mobility training;Gait training;Balance training;Neuromuscular re-education;Patient/family education    PT Next Visit Plan  Recheck BPPV (nose aligned with ceiling vs 45 deg was more provocative), establish habituation HEP (forward flexion to sit and/or sit<>sidelying with head rotated  to 90 deg); balance activities in corner with head motion for functional activities, treat BPPV with demi semont as indicated.     Consulted and Agree with Plan of Care  Patient;Family member/caregiver    Family Member Consulted  spouse       Patient will benefit from skilled therapeutic intervention in order to improve the following deficits and impairments:  Dizziness, Decreased balance, Decreased activity tolerance, Abnormal gait, Postural dysfunction  Visit Diagnosis: BPPV (benign paroxysmal positional vertigo), left  Dizziness and giddiness  Unsteadiness on feet  Other abnormalities of gait and mobility     Problem List Patient Active Problem List   Diagnosis Date Noted  . Cough 02/06/2018  . Recurrent syncope 11/12/2017  . Rash 11/12/2017  . Hyperglycemia 11/28/2016  . Fever 11/28/2016  . Generalized anxiety disorder 02/02/2016  . Dysuria 02/02/2016  . Acute sinus infection 11/22/2015   . Anxiety state 11/22/2015  . Fatigue 09/20/2015  . Diarrhea 08/25/2015  . Cervicogenic headache 06/14/2015  . Nonallopathic lesion of cervical region 06/14/2015  . Neck pain on right side 05/24/2015  . Encounter for well adult exam with abnormal findings 02/15/2015  . Abnormal urine odor 12/28/2014  . Severe obesity (BMI >= 40) (HCC) 12/28/2014  . Flank pain 11/17/2014  . Headache 04/03/2014  . Heart palpitations 01/22/2014  . Obesity 03/21/2012    Julie Mcneil, PT 03/20/2018, 2:30 PM  [facility] [address] [place], [place], [address] Phone: [phone]   Fax:  [phone]  Name: Julie Mcneil MRN: [id number] Date of Birth: 08-Jul-1981

## 2018-03-21 ENCOUNTER — Ambulatory Visit: Payer: Self-pay | Admitting: Physical Therapy

## 2018-03-21 DIAGNOSIS — H8112 Benign paroxysmal vertigo, left ear: Secondary | ICD-10-CM

## 2018-03-21 DIAGNOSIS — R2681 Unsteadiness on feet: Secondary | ICD-10-CM

## 2018-03-22 ENCOUNTER — Encounter: Payer: Self-pay | Admitting: Physical Therapy

## 2018-03-22 NOTE — Therapy (Signed)
[facility] [address] [place], [place], [address] Phone: [phone]   Fax:  [phone]  Physical Therapy Treatment  Patient Details  Name: Julie Mcneil MRN: [id number] Date of Birth: 22-Jul-1981 Referring Provider: Melvyn Novas, MD   Encounter Date: 03/21/2018  PT End of Session - 03/22/18 1304    Visit Number  2    Number of Visits  8    Date for PT Re-Evaluation  04/19/18    Authorization Type  BCBS     PT Start Time  1401    PT Stop Time  1446    PT Time Calculation (min)  45 min       Past Medical History:  Diagnosis Date  . Anxiety    Panic attacks  . Cervical dysplasia   . GERD (gastroesophageal reflux disease)   . Obesity   . UTI (urinary tract infection)    recurrent  . Vaginal yeast infection    recurrent  . Vertigo     Past Surgical History:  Procedure Laterality Date  . CESAREAN SECTION  9528,4132   11-28-11  . COLPOSCOPY    . TUBAL LIGATION     2013 C-SECTION    There were no vitals filed for this visit.  Subjective Assessment - 03/22/18 1241    Subjective  Pt states she is scared (of vertigo occurring) and anxious; feels "off balance and unsteady"; accompanied to PT by her husband; states she did shower by herself this am which she has not been able to do    Pertinent History  has been treated for migraines    Patient Stated Goals  Get rid of vertigo.  Be able to take care of kids at night. Shower by myself (I can't look up or down because she feels like she could fall).     Currently in Pain?  No/denies               Pt had (-) Rt and Lt Dix-Hallpike tests with no nystagmus and no c/o vertigo   Pt had (-) Rt and Lt sidelying tests with no c/o vertigo and no nystagmus   Pt able to roll supine to Rt and Lt sides without vertigo and no nystagmus        Vestibular Treatment/Exercise - 03/22/18 0001      Vestibular Treatment/Exercise   Vestibular Treatment  Provided  Gaze    Gaze Exercises  X1 Viewing Horizontal;X1 Viewing Vertical;Comment in standing - gave for HEP      X1 Viewing Horizontal   Foot Position  standing    Time  -- 30 secs    Reps  1    Comments  mild c/o dizziness with horizontal      X1 Viewing Vertical   Foot Position  standing    Time  -- 30 secs    Reps  1    Comments  no c/o incr. dizziness         Balance Exercises - 03/22/18 1243      Balance Exercises: Standing   Standing Eyes Opened  Narrow base of support (BOS);Wide (BOA);Head turns;Foam/compliant surface;5 reps    Standing Eyes Closed  Narrow base of support (BOS);Wide (BOA);Head turns;Foam/compliant surface;5 reps        PT Education - 03/22/18 1303    Education provided  Yes    Education Details  x1 viewing, gaze stabilization    Person(s) Educated  Patient;Spouse    Methods  Explanation;Demonstration;Handout  Comprehension  Verbalized understanding;Returned demonstration          PT Long Term Goals - 03/20/18 1305      PT LONG TERM GOAL #1   Title  The patient will be indep with HEP for habituation, gaze x 1 adapatation, and balance.    Time  4    Period  Weeks    Target Date  04/19/18      PT LONG TERM GOAL #2   Title  The patient will have negative positional testing indicating resolution of BPPV symptoms.    Time  4    Period  Weeks    Target Date  04/19/18      PT LONG TERM GOAL #3   Title  The patient will return to driving to improve IADL independence.    Time  4    Period  Weeks    Target Date  04/19/18      PT LONG TERM GOAL #4   Title  The patient will subjectively report being able to shower independently without c/o dizziness.    Time  4    Period  Weeks    Target Date  04/19/18            Plan - 03/22/18 1304    Clinical Impression Statement  No nystagmus was observed during any positional testing - or with rolling supine to left or right sides; pt appears anxious that vertigo will occur which limits  her mobility and contributes to fear of moving    PT Frequency  2x / week    PT Duration  4 weeks    PT Treatment/Interventions  ADLs/Self Care Home Management;Canalith Repostioning;Vestibular;Therapeutic exercise;Therapeutic activities;Functional mobility training;Gait training;Balance training;Neuromuscular re-education;Patient/family education    PT Next Visit Plan  recheck BPPV - D/C if no vertigo    PT Home Exercise Plan  x1 viewing, balance on foam     Consulted and Agree with Plan of Care  Patient;Family member/caregiver    Family Member Consulted  spouse       Patient will benefit from skilled therapeutic intervention in order to improve the following deficits and impairments:  Dizziness, Decreased balance, Decreased activity tolerance, Abnormal gait, Postural dysfunction  Visit Diagnosis: BPPV (benign paroxysmal positional vertigo), left  Unsteadiness on feet     Problem List Patient Active Problem List   Diagnosis Date Noted  . Cough 02/06/2018  . Recurrent syncope 11/12/2017  . Rash 11/12/2017  . Hyperglycemia 11/28/2016  . Fever 11/28/2016  . Generalized anxiety disorder 02/02/2016  . Dysuria 02/02/2016  . Acute sinus infection 11/22/2015  . Anxiety state 11/22/2015  . Fatigue 09/20/2015  . Diarrhea 08/25/2015  . Cervicogenic headache 06/14/2015  . Nonallopathic lesion of cervical region 06/14/2015  . Neck pain on right side 05/24/2015  . Encounter for well adult exam with abnormal findings 02/15/2015  . Abnormal urine odor 12/28/2014  . Severe obesity (BMI >= 40) (HCC) 12/28/2014  . Flank pain 11/17/2014  . Headache 04/03/2014  . Heart palpitations 01/22/2014  . Obesity 03/21/2012    DildayDonavan Burnet, PT 03/22/2018, 1:23 PM  [facility] [address] [place], [place], [address] Phone: [phone]   Fax:  [phone]  Name: Julie Mcneil MRN: [id number] Date of Birth:  August 04, 1981

## 2018-03-22 NOTE — Patient Instructions (Signed)
Feet Apart, Head Motion - Eyes Closed   With eyes closed and feet shoulder width apart, move head slowly, up and down. Repeat 1 times per session. Do 1-2 sessions per day.  Copyright  VHI. All rights reserved.  Feet Together, Head Motion - Eyes Closed      With eyes closed and feet together, move head slowly, up and down. Repeat1 times per session. Do 1-2 sessions per day.  DO standing on Pillow   Feet Apart (Compliant Surface) Head Motion - Eyes Closed    Stand on compliant surface: pillows with feet shoulder width apart. Close eyes and move head slowly, up and down. Repeat 1 times per session. Do 1-2 sessions per day.  Copyright  VHI. All rights reserved.  Feet Together (Compliant Surface) Head Motion - Eyes Closed    Stand on compliant surface: pillows with feet together. Close eyes and move head slowly, up and down. Repeat 1 times per session. Do 1-2 sessions per day.   Gaze Stabilization: Standing Feet Apart    Feet shoulder width apart, keeping eyes on target on wall __6__ feet away, tilt head down 15-30 and move head side to side for _60___ seconds. Repeat while moving head up and down for _60___ seconds. Do __3__ sessions per day.   Copyright  VHI. All rights reserved.  Gaze Stabilization: Tip Card  1.Target must remain in focus, not blurry, and appear stationary while head is in motion. 2.Perform exercises with small head movements (45 to either side of midline). 3.Increase speed of head motion so long as target is in focus. 4.If you wear eyeglasses, be sure you can see target through lens (therapist will give specific instructions for bifocal / progressive lenses). 5.These exercises may provoke dizziness or nausea. Work through these symptoms. If too dizzy, slow head movement slightly. Rest between each exercise. 6.Exercises demand concentration; avoid distractions. 7.For safety, perform standing exercises close to a counter, wall, corner, or next to  someone.  Copyright  VHI. All rights reserved.

## 2018-03-25 ENCOUNTER — Ambulatory Visit: Payer: Self-pay | Admitting: Physical Therapy

## 2018-04-05 ENCOUNTER — Encounter

## 2018-04-08 ENCOUNTER — Ambulatory Visit: Payer: Self-pay | Attending: Neurology | Admitting: Physical Therapy

## 2018-04-11 ENCOUNTER — Ambulatory Visit: Payer: Self-pay | Admitting: Physical Therapy

## 2018-07-09 ENCOUNTER — Ambulatory Visit (INDEPENDENT_AMBULATORY_CARE_PROVIDER_SITE_OTHER): Payer: Self-pay | Admitting: Women's Health

## 2018-07-09 ENCOUNTER — Encounter: Payer: Self-pay | Admitting: Women's Health

## 2018-07-09 VITALS — BP 120/82

## 2018-07-09 DIAGNOSIS — Z113 Encounter for screening for infections with a predominantly sexual mode of transmission: Secondary | ICD-10-CM

## 2018-07-09 DIAGNOSIS — R350 Frequency of micturition: Secondary | ICD-10-CM

## 2018-07-09 DIAGNOSIS — N898 Other specified noninflammatory disorders of vagina: Secondary | ICD-10-CM

## 2018-07-09 DIAGNOSIS — N3001 Acute cystitis with hematuria: Secondary | ICD-10-CM

## 2018-07-09 LAB — WET PREP FOR TRICH, YEAST, CLUE

## 2018-07-09 MED ORDER — SULFAMETHOXAZOLE-TRIMETHOPRIM 800-160 MG PO TABS: 1.0000 | ORAL_TABLET | Freq: Two times a day (BID) | ORAL | 0 refills | Status: DC

## 2018-07-09 NOTE — Patient Instructions (Signed)

## 2018-07-09 NOTE — Progress Notes (Signed)
37 year old MWF G4, P3 presents with several complaints.  Reports right lower breast tenderness with questionable mass, noted for the past week, also increased/change odor with urine with increased frequency and had unprotected intercourse x1 with new partner.  Denies visible vaginal discharge, irritation, pain with urination or fever.  States does have a counseling appointment for marital issues, no abuse.  Monthly cycle/BTL.  Exam: Appears well.  Obese.  Examined sitting and lying position without visible retractions, dimpling, erythema, no palpable nodules.  Area of tenderness at 5 o'clock position and right breast.  No CVAT.  Abdomen obese nontender, external genitalia within normal limits, speculum exam no visible discharge or odor, wet prep negative.  GC/Chlamydia culture taken.  Bimanual no CMT or adnexal tenderness. UA: +2 blood, positive nitrites, no WBCs, 3-10 RBCs, TNTC bacteria  UTI Right breast tenderness questionable lump  Plan: Septra twice daily for 3 days prescription, proper use given and reviewed.  UTI prevention discussed.  Urine culture pending.  Encouraged counseling. GC/Chlamydia culture pending.  Will check HIV, hepatitis and RPR at annual exam next month.  Diagnostic  right breast mammogram.

## 2018-07-10 ENCOUNTER — Encounter: Payer: Self-pay | Admitting: *Deleted

## 2018-07-10 ENCOUNTER — Telehealth: Payer: Self-pay | Admitting: *Deleted

## 2018-07-10 DIAGNOSIS — N644 Mastodynia: Secondary | ICD-10-CM

## 2018-07-10 LAB — C. TRACHOMATIS/N. GONORRHOEAE RNA
C. trachomatis RNA, TMA: NOT DETECTED
N. gonorrhoeae RNA, TMA: NOT DETECTED

## 2018-07-10 NOTE — Telephone Encounter (Signed)
Patient scheduled on 07/16/18 @ 10:40am at breast center. Unable to leave message patient mailbox is full. I will send my chart message

## 2018-07-10 NOTE — Telephone Encounter (Signed)
Patient received mychart message.

## 2018-07-10 NOTE — Telephone Encounter (Signed)
-----   Message from Harrington Challenger, NP sent at 07/09/2018  4:32 PM EDT ----- Needs diagnostic mammogram on rt breast - tenderness and pt palpated ? Mass at 5:00.  Any day ok .  None palpated by me.

## 2018-07-12 LAB — URINALYSIS, COMPLETE W/RFL CULTURE
Bilirubin Urine: NEGATIVE
Glucose, UA: NEGATIVE
Hyaline Cast: NONE SEEN /LPF
Ketones, ur: NEGATIVE
Leukocyte Esterase: NEGATIVE
Nitrites, Initial: POSITIVE — AB
Protein, ur: NEGATIVE
Specific Gravity, Urine: 1.025 (ref 1.001–1.03)
WBC, UA: NONE SEEN /HPF (ref 0–5)
pH: 5.5 (ref 5.0–8.0)

## 2018-07-12 LAB — URINE CULTURE
MICRO NUMBER:: 91055551
SPECIMEN QUALITY:: ADEQUATE

## 2018-07-12 LAB — CULTURE INDICATED

## 2018-07-16 ENCOUNTER — Inpatient Hospital Stay: Admission: RE | Admit: 2018-07-16 | Payer: Self-pay | Source: Ambulatory Visit

## 2018-07-16 ENCOUNTER — Other Ambulatory Visit: Payer: Self-pay

## 2018-07-19 ENCOUNTER — Ambulatory Visit: Payer: Medicaid Other | Admitting: Internal Medicine

## 2018-07-19 DIAGNOSIS — Z0289 Encounter for other administrative examinations: Secondary | ICD-10-CM

## 2018-07-20 ENCOUNTER — Encounter (HOSPITAL_COMMUNITY): Payer: Self-pay | Admitting: Emergency Medicine

## 2018-07-20 ENCOUNTER — Ambulatory Visit (HOSPITAL_COMMUNITY)
Admission: EM | Admit: 2018-07-20 | Discharge: 2018-07-20 | Disposition: A | Discharge status: Home / Self Care | Payer: BLUE CROSS/BLUE SHIELD | Attending: Internal Medicine | Admitting: Internal Medicine

## 2018-07-20 DIAGNOSIS — Z791 Long term (current) use of non-steroidal anti-inflammatories (NSAID): Secondary | ICD-10-CM | POA: Insufficient documentation

## 2018-07-20 DIAGNOSIS — R05 Cough: Secondary | ICD-10-CM

## 2018-07-20 DIAGNOSIS — J029 Acute pharyngitis, unspecified: Secondary | ICD-10-CM | POA: Diagnosis not present

## 2018-07-20 DIAGNOSIS — J039 Acute tonsillitis, unspecified: Secondary | ICD-10-CM | POA: Diagnosis not present

## 2018-07-20 DIAGNOSIS — Z881 Allergy status to other antibiotic agents status: Secondary | ICD-10-CM | POA: Insufficient documentation

## 2018-07-20 DIAGNOSIS — B9689 Other specified bacterial agents as the cause of diseases classified elsewhere: Secondary | ICD-10-CM | POA: Insufficient documentation

## 2018-07-20 LAB — POCT RAPID STREP A
Streptococcus, Group A Screen (Direct): NEGATIVE
Streptococcus, Group A Screen (Direct): NEGATIVE

## 2018-07-20 LAB — POCT INFECTIOUS MONO SCREEN: Mono Screen: NEGATIVE

## 2018-07-20 MED ORDER — AMOXICILLIN 500 MG PO CAPS: 500.0000 mg | ORAL_CAPSULE | Freq: Three times a day (TID) | ORAL | 0 refills | Status: DC

## 2018-07-20 MED ORDER — IPRATROPIUM BROMIDE 0.06 % NA SOLN: 2.0000 | Freq: Four times a day (QID) | NASAL | 0 refills | Status: DC

## 2018-07-20 MED ORDER — AMOXICILLIN 400 MG/5ML PO SUSR: 500.0000 mg | Freq: Two times a day (BID) | ORAL | 0 refills | Status: AC

## 2018-07-20 NOTE — ED Provider Notes (Signed)
[facility]    CSN: [id number] Arrival date & time: 07/20/18  1724     History   Chief Complaint Chief Complaint  Patient presents with  . Sore Throat    HPI Julie Mcneil is a 37 y.o. female.   37 year old female comes in for 4-day history of URI symptoms.  Has had rhinorrhea, nasal congestion, sore throat, mild cough.  Has had intermittent body aches.  T-max 100.4.  Decreased appetite, but has been drinking without difficulty.  Denies swelling of the throat, trouble breathing, trouble swallowing, tripoding, drooling, trismus.  Ibuprofen for fever, last dose last night.  No sick contact, never smoker.  No history of mono.      Past Medical History:  Diagnosis Date  . Anxiety    Panic attacks  . Cervical dysplasia   . GERD (gastroesophageal reflux disease)   . Obesity   . UTI (urinary tract infection)    recurrent  . Vaginal yeast infection    recurrent  . Vertigo     Patient Active Problem List   Diagnosis Date Noted  . Cough 02/06/2018  . Recurrent syncope 11/12/2017  . Rash 11/12/2017  . Hyperglycemia 11/28/2016  . Fever 11/28/2016  . Generalized anxiety disorder 02/02/2016  . Dysuria 02/02/2016  . Acute sinus infection 11/22/2015  . Anxiety state 11/22/2015  . Fatigue 09/20/2015  . Diarrhea 08/25/2015  . Cervicogenic headache 06/14/2015  . Nonallopathic lesion of cervical region 06/14/2015  . Neck pain on right side 05/24/2015  . Encounter for well adult exam with abnormal findings 02/15/2015  . Abnormal urine odor 12/28/2014  . Severe obesity (BMI >= 40) (HCC) 12/28/2014  . Flank pain 11/17/2014  . Headache 04/03/2014  . Heart palpitations 01/22/2014  . Obesity 03/21/2012    Past Surgical History:  Procedure Laterality Date  . CESAREAN SECTION  4098,1191   11-28-11  . COLPOSCOPY    . TUBAL LIGATION     2013 C-SECTION    OB History    Gravida  4   Para  3   Term  3   Preterm  0   AB  1   Living  3     SAB  0   TAB  0   Ectopic  1   Multiple  0   Live Births  3            Home Medications    Prior to Admission medications   Medication Sig Start Date End Date Taking? Authorizing Provider  amoxicillin (AMOXIL) 400 MG/5ML suspension Take 6.3 mLs (500 mg total) by mouth 2 (two) times daily for 10 days. 07/20/18 07/30/18  Belinda Fisher, PA-C  ibuprofen (ADVIL,MOTRIN) 200 MG tablet Take 200 mg by mouth every 6 (six) hours as needed for moderate pain.     [provider]  ipratropium (ATROVENT) 0.06 % nasal spray Place 2 sprays into both nostrils 4 (four) times daily. 07/20/18   Belinda Fisher, PA-C    Family History Family History  Problem Relation Age of Onset  . Early death Mother        Tax inspector  . Heart disease Maternal Grandmother   . Diabetes Maternal Grandmother   . Cancer Maternal Grandmother        Lymphoma  . Hypertension Paternal Grandmother   . Stroke Paternal Grandmother   . Hypertension Paternal Grandfather   . Anesthesia problems Neg Hx   . Hypotension Neg Hx   . Malignant hyperthermia Neg  Hx   . Pseudochol deficiency Neg Hx     Social History Social History   Tobacco Use  . Smoking status: Never Smoker  . Smokeless tobacco: Never Used  Substance Use Topics  . Alcohol use: Yes    Alcohol/week: 0.0 standard drinks    Comment: occ  . Drug use: No     Allergies   Sumatriptan; Levaquin [levofloxacin in d5w]; and Nitrofurantoin monohyd macro   Review of Systems Review of Systems  Reason unable to perform ROS: See HPI as above.     Physical Exam Triage Vital Signs ED Triage Vitals [07/20/18 1804]  Enc Vitals Group     BP 131/77     Pulse Rate 82     Resp 16     Temp 99 F (37.2 C)     Temp src      SpO2 100 %     Weight      Height      Head Circumference      Peak Flow      Pain Score      Pain Loc      Pain Edu?      Excl. in GC?    No data found.  Updated Vital Signs BP 131/77   Pulse 82   Temp 99 F (37.2 C)   Resp 16   SpO2  100%   Physical Exam  Constitutional: She is oriented to person, place, and time. She appears well-developed and well-nourished. No distress.  HENT:  Head: Normocephalic and atraumatic.  Right Ear: Tympanic membrane, external ear and ear canal normal. Tympanic membrane is not erythematous and not bulging.  Left Ear: Tympanic membrane, external ear and ear canal normal. Tympanic membrane is not erythematous and not bulging.  Nose: Nose normal. Right sinus exhibits no maxillary sinus tenderness and no frontal sinus tenderness. Left sinus exhibits no maxillary sinus tenderness and no frontal sinus tenderness.  Mouth/Throat: Uvula is midline, oropharynx is clear and moist and mucous membranes are normal. Tonsils are 2+ on the right. Tonsils are 2+ on the left. Tonsillar exudate.  Eyes: Pupils are equal, round, and reactive to light. Conjunctivae are normal.  Neck: Normal range of motion. Neck supple.  Cardiovascular: Normal rate, regular rhythm and normal heart sounds. Exam reveals no gallop and no friction rub.  No murmur heard. Pulmonary/Chest: Effort normal and breath sounds normal. She has no decreased breath sounds. She has no wheezes. She has no rhonchi. She has no rales.  Lymphadenopathy:    She has no cervical adenopathy.  Neurological: She is alert and oriented to person, place, and time.  Skin: Skin is warm and dry.  Psychiatric: She has a normal mood and affect. Her behavior is normal. Judgment normal.     UC Treatments / Results  Labs (all labs ordered are listed, but only abnormal results are displayed) Labs Reviewed  CULTURE, GROUP A STREP [facility])  POCT RAPID STREP A  POCT RAPID STREP A  POCT INFECTIOUS MONO SCREEN    EKG None  Radiology No results found.  Procedures Procedures (including critical care time)  Medications Ordered in UC Medications - No data to display  Initial Impression / Assessment and Plan / UC Course  I have reviewed the triage vital signs  and the nursing notes.  Pertinent labs & imaging results that were available during my care of the patient were reviewed by me and considered in my medical decision making (see chart for details).  Rapid strep and mono negative.  However, given history and exam, will treat for tonsillitis empirically with amoxicillin.  Other symptomatic treatment discussed.  Return precautions.  Patient expresses understanding and agrees to plan.  Final Clinical Impressions(s) / UC Diagnoses   Final diagnoses:  Acute tonsillitis, unspecified etiology    ED Prescriptions    Medication Sig Dispense Auth. Provider   amoxicillin (AMOXIL) 500 MG capsule  (Status: Discontinued) Take 1 capsule (500 mg total) by mouth 3 (three) times daily. 21 capsule Sumit Branham V, PA-C   ipratropium (ATROVENT) 0.06 % nasal spray Place 2 sprays into both nostrils 4 (four) times daily. 15 mL Tuana Hoheisel V, PA-C   amoxicillin (AMOXIL) 400 MG/5ML suspension Take 6.3 mLs (500 mg total) by mouth 2 (two) times daily for 10 days. 130 mL Threasa Alpha, [place] 07/20/18 1911

## 2018-07-20 NOTE — ED Triage Notes (Signed)
Pt c/o sore throat and fever since Wednesday.

## 2018-07-20 NOTE — Discharge Instructions (Signed)
Rapid strep and mono negative.  However, as discussed, given the appearance of her throat, we will treat you empirically for tonsillitis.  Start amoxicillin as directed. Tylenol/Motrin for fever and pain. This could still be caused by a virus, in that case, will resolve on own in 7-10 days. Atrovent nasal spray for nasal congestion/drainage. Monitor for any worsening of symptoms, trouble breathing, trouble swallowing, swelling of the throat, leaning forward to breath, drooling, follow up here or at the emergency department for reevaluation.  For sore throat/cough try using a honey-based tea. Use 3 teaspoons of honey with juice squeezed from half lemon. Place shaved pieces of ginger into 1/2-1 cup of water and warm over stove top. Then mix the ingredients and repeat every 4 hours as needed.

## 2018-07-22 ENCOUNTER — Ambulatory Visit: Payer: Self-pay | Admitting: Women's Health

## 2018-07-22 LAB — CULTURE, GROUP A STREP (THRC)

## 2018-08-12 ENCOUNTER — Encounter (HOSPITAL_COMMUNITY): Payer: Self-pay

## 2018-08-12 ENCOUNTER — Inpatient Hospital Stay (HOSPITAL_COMMUNITY)
Admission: AD | Admit: 2018-08-12 | Discharge: 2018-08-12 | Disposition: A | Discharge status: Home / Self Care | Payer: BLUE CROSS/BLUE SHIELD | Source: Ambulatory Visit | Attending: Obstetrics & Gynecology | Admitting: Obstetrics & Gynecology

## 2018-08-12 ENCOUNTER — Other Ambulatory Visit: Payer: Self-pay

## 2018-08-12 DIAGNOSIS — F419 Anxiety disorder, unspecified: Secondary | ICD-10-CM | POA: Insufficient documentation

## 2018-08-12 DIAGNOSIS — K219 Gastro-esophageal reflux disease without esophagitis: Secondary | ICD-10-CM | POA: Diagnosis not present

## 2018-08-12 DIAGNOSIS — N920 Excessive and frequent menstruation with regular cycle: Secondary | ICD-10-CM

## 2018-08-12 DIAGNOSIS — R42 Dizziness and giddiness: Secondary | ICD-10-CM | POA: Diagnosis not present

## 2018-08-12 DIAGNOSIS — Z3202 Encounter for pregnancy test, result negative: Secondary | ICD-10-CM

## 2018-08-12 DIAGNOSIS — Z79899 Other long term (current) drug therapy: Secondary | ICD-10-CM | POA: Insufficient documentation

## 2018-08-12 DIAGNOSIS — Z881 Allergy status to other antibiotic agents status: Secondary | ICD-10-CM | POA: Insufficient documentation

## 2018-08-12 DIAGNOSIS — E669 Obesity, unspecified: Secondary | ICD-10-CM | POA: Insufficient documentation

## 2018-08-12 DIAGNOSIS — N939 Abnormal uterine and vaginal bleeding, unspecified: Secondary | ICD-10-CM | POA: Diagnosis present

## 2018-08-12 DIAGNOSIS — Z791 Long term (current) use of non-steroidal anti-inflammatories (NSAID): Secondary | ICD-10-CM | POA: Diagnosis not present

## 2018-08-12 LAB — URINALYSIS, ROUTINE W REFLEX MICROSCOPIC

## 2018-08-12 LAB — URINALYSIS, MICROSCOPIC (REFLEX): RBC / HPF: >50 RBC/hpf (ref 0–5)

## 2018-08-12 LAB — CBC
HCT: 37 % (ref 36.0–46.0)
Hemoglobin: 11.9 g/dL — ABNORMAL LOW (ref 12.0–15.0)
MCH: 27.8 pg (ref 26.0–34.0)
MCHC: 32.2 g/dL (ref 30.0–36.0)
MCV: 86.4 fL (ref 78.0–100.0)
Platelets: 390 10*3/uL (ref 150–400)
RBC: 4.28 MIL/uL (ref 3.87–5.11)
RDW: 13.5 % (ref 11.5–15.5)
WBC: 12.5 10*3/uL — ABNORMAL HIGH (ref 4.0–10.5)

## 2018-08-12 LAB — POCT PREGNANCY, URINE: Preg Test, Ur: NEGATIVE

## 2018-08-12 MED ORDER — MEGESTROL ACETATE 40 MG PO TABS: 40.0000 mg | ORAL_TABLET | Freq: Two times a day (BID) | ORAL | 0 refills | Status: DC

## 2018-08-12 NOTE — MAU Note (Signed)
Having an extremely bad period, had on 3 pads att once and bled through.  Spotting started on Thursday, flow started on Sat.  Passing golfball sized clots. Dizzy today

## 2018-08-12 NOTE — Discharge Instructions (Signed)
Dysfunctional Uterine Bleeding °Dysfunctional uterine bleeding is abnormal bleeding from the uterus. Dysfunctional uterine bleeding includes: °· A period that comes earlier or later than usual. °· A period that is lighter, heavier, or has blood clots. °· Bleeding between periods. °· Skipping one or more periods. °· Bleeding after sexual intercourse. °· Bleeding after menopause. ° °Follow these instructions at home: °Pay attention to any changes in your symptoms. Follow these instructions to help with your condition: °Eating and drinking °· Eat well-balanced meals. Include foods that are high in iron, such as liver, meat, shellfish, green leafy vegetables, and eggs. °· If you become constipated: °? Drink plenty of water. °? Eat fruits and vegetables that are high in water and fiber, such as spinach, carrots, raspberries, apples, and mango. °Medicines °· Take over-the-counter and prescription medicines only as told by your health care provider. °· Do not change medicines without talking with your health care provider. °· Aspirin or medicines that contain aspirin may make the bleeding worse. Do not take those medicines: °? During the week before your period. °? During your period. °· If you were prescribed iron pills, take them as told by your health care provider. Iron pills help to replace iron that your body loses because of this condition. °Activity °· If you need to change your sanitary pad or tampon more than one time every 2 hours: °? Lie in bed with your feet raised (elevated). °? Place a cold pack on your lower abdomen. °? Rest as much as possible until the bleeding stops or slows down. °· Do not try to lose weight until the bleeding has stopped and your blood iron level is back to normal. °Other Instructions °· For two months, write down: °? When your period starts. °? When your period ends. °? When any abnormal bleeding occurs. °? What problems you notice. °· Keep all follow up visits as told by your health  care provider. This is important. °Contact a health care provider if: °· You get light-headed or weak. °· You have nausea and vomiting. °· You cannot eat or drink without vomiting. °· You feel dizzy or have diarrhea while you are taking medicines. °· You are taking birth control pills or hormones, and you want to change them or stop taking them. °Get help right away if: °· You develop a fever or chills. °· You need to change your sanitary pad or tampon more than one time per hour. °· Your bleeding becomes heavier, or your flow contains clots more often. °· You develop pain in your abdomen. °· You lose consciousness. °· You develop a rash. °This information is not intended to replace advice given to you by your health care provider. Make sure you discuss any questions you have with your health care provider. °Document Released: 10/20/2000 Document Revised: 03/30/2016 Document Reviewed: 01/18/2015 °Elsevier Interactive Patient Education © 2018 [employer] Inc[employer] ° °

## 2018-08-12 NOTE — MAU Provider Note (Signed)
Chief Complaint: Vaginal Bleeding   First Provider Initiated Contact with Patient 08/12/18 1331     SUBJECTIVE HPI: Julie Mcneil is a 37 y.o. non pregnant female who presents to [facility] reporting vaginal bleeding. States her periods have been heavier since her BTL in 2013 but have been heavier than normal for her over the last several cycles. Current cycle is her heaviest yet. Current episode started on Saturday. States heaviest today. Normally heavy days last 2-3 days. Goes through 4-5 pads per day and passing some clots. Called the office to be seen for management of bleeding & was told to come to MAU for evaluation. Denies pain. Had some dizziness earlier today. Currently denies dizziness. No h/a, CP, or palpitations.    Past Medical History:  Diagnosis Date  . Anxiety    Panic attacks  . Cervical dysplasia   . GERD (gastroesophageal reflux disease)   . Obesity   . UTI (urinary tract infection)    recurrent  . Vaginal yeast infection    recurrent  . Vertigo    OB History  Gravida Para Term Preterm AB Living  4 3 3  0 1 3  SAB TAB Ectopic Multiple Live Births  0 0 1 0 3    # Outcome Date GA Lbr Len/2nd Weight Sex Delivery Anes PTL Lv  4 Term 11/28/11 [redacted]w[redacted]d  3822 g F CS-LTranv Spinal  LIV  3 Term 06/29/10 [redacted]w[redacted]d  4111 g F CS-LTranv Spinal  LIV     Birth Comments: Elective  2 Ectopic 2010             Birth Comments: MTX  1 Term 12/27/05 [redacted]w[redacted]d  4593 g F CS-LTranv Spinal  LIV     Birth Comments: Macrosomia    Past Surgical History:  Procedure Laterality Date  . CESAREAN SECTION  1610,9604   11-28-11  . COLPOSCOPY    . TUBAL LIGATION     2013 C-SECTION   Social History   Socioeconomic History  . Marital status: Married    Spouse name: Not on file  . Number of children: 3  . Years of education: 41  . Highest education level: Not on file  Occupational History  . Occupation: Consulting civil engineer    Comment: Educational psychologist  Social Needs  .  Financial resource strain: Not on file  . Food insecurity:    Worry: Not on file    Inability: Not on file  . Transportation needs:    Medical: Not on file    Non-medical: Not on file  Tobacco Use  . Smoking status: Never Smoker  . Smokeless tobacco: Never Used  Substance and Sexual Activity  . Alcohol use: Yes    Alcohol/week: 0.0 standard drinks    Comment: occ  . Drug use: No  . Sexual activity: Yes    Birth control/protection: Surgical    Comment: TUBAL LIGATION  Lifestyle  . Physical activity:    Days per week: Not on file    Minutes per session: Not on file  . Stress: Not on file  Relationships  . Social connections:    Talks on phone: Not on file    Gets together: Not on file    Attends religious service: Not on file    Active member of club or organization: Not on file    Attends meetings of clubs or organizations: Not on file    Relationship status: Not on file  . Intimate partner violence:  Fear of current or ex partner: Not on file    Emotionally abused: Not on file    Physically abused: Not on file    Forced sexual activity: Not on file  Other Topics Concern  . Not on file  Social History Narrative   Fun: Going to [employer]   Family History  Problem Relation Age of Onset  . Early death Mother        Tax inspector  . Heart disease Maternal Grandmother   . Diabetes Maternal Grandmother   . Cancer Maternal Grandmother        Lymphoma  . Hypertension Paternal Grandmother   . Stroke Paternal Grandmother   . Hypertension Paternal Grandfather   . Anesthesia problems Neg Hx   . Hypotension Neg Hx   . Malignant hyperthermia Neg Hx   . Pseudochol deficiency Neg Hx    No current facility-administered medications on file prior to encounter.    Current Outpatient Medications on File Prior to Encounter  Medication Sig Dispense Refill  . ibuprofen (ADVIL,MOTRIN) 200 MG tablet Take 200 mg by mouth every 6 (six) hours as needed for moderate pain.     Marland Kitchen ipratropium  (ATROVENT) 0.06 % nasal spray Place 2 sprays into both nostrils 4 (four) times daily. 15 mL 0   Allergies  Allergen Reactions  . Sumatriptan Shortness Of Breath    Tight chest  . Levaquin [Levofloxacin In D5w] Nausea Only  . Nitrofurantoin Monohyd Macro Hives and Nausea And Vomiting    I have reviewed patient's Past Medical Hx, Surgical Hx, Family Hx, Social Hx, medications and allergies.   Review of Systems  Constitutional: Negative.   Gastrointestinal: Negative.   Genitourinary: Positive for menstrual problem and vaginal bleeding.  Neurological: Positive for dizziness.    OBJECTIVE Patient Vitals for the past 24 hrs:  BP Temp Temp src Pulse Resp SpO2 Weight  08/12/18 1249 (!) 147/90 - - 97 - - -  08/12/18 1245 (!) 135/92 - - (!) 101 - - -  08/12/18 1243 (!) 163/89 - - 96 - - -  08/12/18 1241 (!) 154/88 - - 96 - - -  08/12/18 1208 (!) 153/98 98.3 F (36.8 C) Oral 80 20 100 % 123.2 kg   Constitutional: Well-developed, well-nourished female in no acute distress.  Cardiovascular: normal rate & rhythm, no murmur Respiratory: normal rate and effort. Lung sounds clear throughout GI: Abd soft, non-tender, Pos BS x 4. No guarding or rebound tenderness MS: Extremities nontender, no edema, normal ROM Neurologic: Alert and oriented x 4.  GU:     SPECULUM EXAM: NEFG, physiologic discharge. Small amount of dark red blood. Cervix pink & smooth.   BIMANUAL: No CMT. cervix closed; uterus normal size, no adnexal tenderness or masses.    LAB RESULTS Results for orders placed or performed during the hospital encounter of 08/12/18 (from the past 24 hour(s))  Urinalysis, Routine w reflex microscopic     Status: Abnormal   Collection Time: 08/12/18 12:15 PM  Result Value Ref Range   Color, Urine RED (A) YELLOW   APPearance CLEAR CLEAR   Specific Gravity, Urine  1.005 - 1.030    TEST NOT REPORTED DUE TO COLOR INTERFERENCE OF URINE PIGMENT   pH  5.0 - 8.0    TEST NOT REPORTED DUE TO COLOR  INTERFERENCE OF URINE PIGMENT   Glucose, UA (A) NEGATIVE mg/dL    TEST NOT REPORTED DUE TO COLOR INTERFERENCE OF URINE PIGMENT   Hgb urine dipstick (A) NEGATIVE  TEST NOT REPORTED DUE TO COLOR INTERFERENCE OF URINE PIGMENT   Bilirubin Urine (A) NEGATIVE    TEST NOT REPORTED DUE TO COLOR INTERFERENCE OF URINE PIGMENT   Ketones, ur (A) NEGATIVE mg/dL    TEST NOT REPORTED DUE TO COLOR INTERFERENCE OF URINE PIGMENT   Protein, ur (A) NEGATIVE mg/dL    TEST NOT REPORTED DUE TO COLOR INTERFERENCE OF URINE PIGMENT   Nitrite (A) NEGATIVE    TEST NOT REPORTED DUE TO COLOR INTERFERENCE OF URINE PIGMENT   Leukocytes, UA (A) NEGATIVE    TEST NOT REPORTED DUE TO COLOR INTERFERENCE OF URINE PIGMENT  Urinalysis, Microscopic (reflex)     Status: Abnormal   Collection Time: 08/12/18 12:15 PM  Result Value Ref Range   RBC / HPF >50 0 - 5 RBC/hpf   WBC, UA 11-20 0 - 5 WBC/hpf   Bacteria, UA MANY (A) NONE SEEN   Squamous Epithelial / LPF 6-10 0 - 5   Urine-Other MUCOUS PRESENT   Pregnancy, urine POC     Status: None   Collection Time: 08/12/18 12:29 PM  Result Value Ref Range   Preg Test, Ur NEGATIVE NEGATIVE  CBC     Status: Abnormal   Collection Time: 08/12/18 12:34 PM  Result Value Ref Range   WBC 12.5 (H) 4.0 - 10.5 K/uL   RBC 4.28 3.87 - 5.11 MIL/uL   Hemoglobin 11.9 (L) 12.0 - 15.0 g/dL   HCT 40.9 81.1 - 91.4 %   MCV 86.4 78.0 - 100.0 fL   MCH 27.8 26.0 - 34.0 pg   MCHC 32.2 30.0 - 36.0 g/dL   RDW 78.2 95.6 - 21.3 %   Platelets 390 150 - 400 K/uL    IMAGING No results found.  MAU COURSE Orders Placed This Encounter  Procedures  . Urinalysis, Routine w reflex microscopic  . CBC  . Urinalysis, Microscopic (reflex)  . Orthostatic vital signs  . Pregnancy, urine POC   No orders of the defined types were placed in this encounter.   MDM UPT negative. Not orthostatic. Hgb 11.9.  Bleeding stable. Pt states she wants f/u with her gyn for management of future heavy cycles. Would  like medication for current cycle no as she is going out of town later this week. Will rx megace. Pt to call office to schedule f/u appointment with her gyn.  ASSESSMENT 1. Menorrhagia with regular cycle   2. Pregnancy examination or test, negative result     PLAN Discharge home in stable condition. Bleeding precautions  Allergies as of 08/12/2018      Reactions   Sumatriptan Shortness Of Breath   Tight chest   Levaquin [levofloxacin In D5w] Nausea Only   Nitrofurantoin Monohyd Macro Hives, Nausea And Vomiting      Medication List    TAKE these medications   ibuprofen 200 MG tablet Commonly known as:  ADVIL,MOTRIN Take 200 mg by mouth every 6 (six) hours as needed for moderate pain.   ipratropium 0.06 % nasal spray Commonly known as:  ATROVENT Place 2 sprays into both nostrils 4 (four) times daily.   megestrol 40 MG tablet Commonly known as:  MEGACE Take 1 tablet (40 mg total) by mouth 2 (two) times daily.        Judeth Horn, NP 08/12/2018  1:31 PM

## 2018-08-26 ENCOUNTER — Encounter: Payer: Self-pay | Admitting: Women's Health

## 2018-08-26 DIAGNOSIS — Z0289 Encounter for other administrative examinations: Secondary | ICD-10-CM

## 2018-09-02 ENCOUNTER — Ambulatory Visit: Payer: BLUE CROSS/BLUE SHIELD | Admitting: Family

## 2018-09-02 ENCOUNTER — Ambulatory Visit: Payer: BLUE CROSS/BLUE SHIELD | Admitting: Psychology

## 2018-09-02 ENCOUNTER — Encounter: Payer: Self-pay | Admitting: Family

## 2018-09-02 ENCOUNTER — Telehealth: Payer: Self-pay | Admitting: Internal Medicine

## 2018-09-02 VITALS — BP 132/78 | HR 97 | Temp 99.1°F | Ht 64.0 in | Wt 270.1 lb

## 2018-09-02 DIAGNOSIS — J039 Acute tonsillitis, unspecified: Secondary | ICD-10-CM

## 2018-09-02 DIAGNOSIS — F4323 Adjustment disorder with mixed anxiety and depressed mood: Secondary | ICD-10-CM

## 2018-09-02 MED ORDER — AZITHROMYCIN 200 MG/5ML PO SUSR: ORAL | 0 refills | Status: DC

## 2018-09-02 NOTE — Progress Notes (Signed)
Julie Mcneil is a 37 y.o. female with the following history as recorded in [employer]:  Patient Active Problem List   Diagnosis Date Noted  . Cough 02/06/2018  . Recurrent syncope 11/12/2017  . Rash 11/12/2017  . Hyperglycemia 11/28/2016  . Fever 11/28/2016  . Generalized anxiety disorder 02/02/2016  . Dysuria 02/02/2016  . Acute sinus infection 11/22/2015  . Anxiety state 11/22/2015  . Fatigue 09/20/2015  . Diarrhea 08/25/2015  . Cervicogenic headache 06/14/2015  . Nonallopathic lesion of cervical region 06/14/2015  . Neck pain on right side 05/24/2015  . Encounter for well adult exam with abnormal findings 02/15/2015  . Abnormal urine odor 12/28/2014  . Severe obesity (BMI >= 40) (HCC) 12/28/2014  . Flank pain 11/17/2014  . Headache 04/03/2014  . Heart palpitations 01/22/2014  . Obesity 03/21/2012    Current Outpatient Medications  Medication Sig Dispense Refill  . azithromycin (ZITHROMAX) 200 MG/5ML suspension Take 2.5 ml x 1 day; then decrease to 1.25 ml x 4 days 22.5 mL 0  . ibuprofen (ADVIL,MOTRIN) 200 MG tablet Take 200 mg by mouth every 6 (six) hours as needed for moderate pain.      No current facility-administered medications for this visit.     Allergies: Sumatriptan; Levaquin [levofloxacin in d5w]; and Nitrofurantoin monohyd macro  Past Medical History:  Diagnosis Date  . Anxiety    Panic attacks  . Cervical dysplasia   . GERD (gastroesophageal reflux disease)   . Obesity   . UTI (urinary tract infection)    recurrent  . Vaginal yeast infection    recurrent  . Vertigo     Past Surgical History:  Procedure Laterality Date  . CESAREAN SECTION  1610,9604   11-28-11  . COLPOSCOPY    . TUBAL LIGATION     2013 C-SECTION    Family History  Problem Relation Age of Onset  . Early death Mother        Tax inspector  . Heart disease Maternal Grandmother   . Diabetes Maternal Grandmother   . Cancer Maternal Grandmother        Lymphoma  . Hypertension  Paternal Grandmother   . Stroke Paternal Grandmother   . Hypertension Paternal Grandfather   . Anesthesia problems Neg Hx   . Hypotension Neg Hx   . Malignant hyperthermia Neg Hx   . Pseudochol deficiency Neg Hx     Social History   Tobacco Use  . Smoking status: Never Smoker  . Smokeless tobacco: Never Used  Substance Use Topics  . Alcohol use: Yes    Alcohol/week: 0.0 standard drinks    Comment: occ    Subjective:  Patient presents with concerns for recurrent tonsil infections; was treated at [facility] in mid-September with Amoxicillin; had negative strep test, mono test at that time; symptoms re-flared in the past 2 weeks; notes that her symptoms re-flared in the past 2 weeks; was treated with Amoxicillin but did not have any type of testing done- difficult to determine if she is still on medication/ ? If taking as prescribed; she initially said she had finished her antibiotics- then indicated that she was still on her medicine but that her symptoms were improving but still concerning; does mention that she has a cracked wisdom tooth on the right side of her mouth where majority of her pain is localized; has appointment scheduled to see her dentist next week;     Objective:  Vitals:   09/02/18 1628  BP: 132/78  Pulse: 97  Temp: 99.1 F (37.3 C)  TempSrc: Oral  SpO2: 98%  Weight: 270 lb 1.9 oz (122.5 kg)  Height: 5\' 4"  (1.626 m)    General: Well developed, well nourished, in no acute distress  Skin : Warm and dry.  Head: Normocephalic and atraumatic  Eyes: Sclera and conjunctiva clear; pupils round and reactive to light; extraocular movements intact  Ears: External normal; canals clear; tympanic membranes normal  Oropharynx: Pink, supple. No suspicious lesions; enlarged tonsils with white exudate Neck: Supple without thyromegaly, adenopathy  Lungs: Respirations unlabored; clear to auscultation bilaterally without wheeze, rales, rhonchi  CVS exam: normal rate and regular rhythm.   Neurologic: Alert and oriented; speech intact; face symmetrical; moves all extremities well; CNII-XII intact without focal deficit  Assessment:  1. Tonsillitis     Plan:  Will update strep panel today; stop Amoxicillin- change to Zithromax; patient is requesting liquid medication; keep planned follow-up with dentist- tooth could be contributing; refer to ENT as well; follow-up as needed.   No follow-ups on file.  Orders Placed This Encounter  Procedures  . Strep Complete Panel  . Ambulatory referral to ENT    Referral Priority:   Routine    Referral Type:   Consultation    Referral Reason:   Specialty Services Required    Requested Specialty:   Otolaryngology    Number of Visits Requested:   1    Requested Prescriptions   Signed Prescriptions Disp Refills  . azithromycin (ZITHROMAX) 200 MG/5ML suspension 22.5 mL 0    Sig: Take 2.5 ml x 1 day; then decrease to 1.25 ml x 4 days

## 2018-09-02 NOTE — Telephone Encounter (Signed)
Copied from CRM [id number]. Topic: Quick Communication - Rx Refill/Question >> Sep 02, 2018  6:21 PM Zada Girt, [place] L wrote: Medication: azithromycin (ZITHROMAX) 200 MG/5ML suspension (please call pharmacy to clarify the dose)  Has the patient contacted their pharmacy? Yes.   (Agent: If no, request that the patient contact the pharmacy for the refill.) (Agent: If yes, when and what did the pharmacy advise?)  Preferred Pharmacy (with phone number or street name): [id number] - [place], [place] - [address] AT [address] OF [facility] [address] [place] [address] Phone: [phone] Fax: [phone]  Agent: Please be advised that RX refills may take up to 3 business days. We ask that you follow-up with your pharmacy.

## 2018-09-03 ENCOUNTER — Telehealth: Payer: Self-pay

## 2018-09-03 NOTE — Telephone Encounter (Signed)
I just spoke to pharmacist; it has been corrected.

## 2018-09-03 NOTE — Telephone Encounter (Signed)
Copied from CRM [id number]. Topic: General - Other >> Sep 03, 2018  8:25 AM Gerrianne Scale wrote: Reason for CRM:   [id number] - [place], [place] - [address]   calling with correct dosage on the      azithromycin (ZITHROMAX) 200 MG/5ML suspension 22.5 ml   Sig: Take 2.5 ml x 1 day; then decrease to 1.25 ml x 4 days  they states that its a pediatric dosage

## 2018-09-20 ENCOUNTER — Ambulatory Visit: Payer: BLUE CROSS/BLUE SHIELD | Admitting: Psychology

## 2018-12-03 ENCOUNTER — Other Ambulatory Visit: Payer: Self-pay | Admitting: Women's Health

## 2018-12-03 MED ORDER — AMOXICILLIN 500 MG PO CAPS: 500.0000 mg | ORAL_CAPSULE | Freq: Three times a day (TID) | ORAL | 0 refills | Status: DC

## 2018-12-20 NOTE — Progress Notes (Addendum)
Cardiology Office Note   Date:  12/23/2018   ID:  Julie Mcneil, DOB Jul 01, 1981, MRN [id number]  PCP:  Corwin Levins, MD  Cardiologist:   No primary care provider on file.   Chief Complaint  Patient presents with  . Dizziness      History of Present Illness: Julie Mcneil is a 38 y.o. female who is referred by Corwin Levins, MD for evaluation of dizziness.  I saw her in March of last year for palpitations and dizziness.Marland Kitchen   She was in the ED last year for this.    There was no clear etiology for the symptoms.  EKG was normal.  Labs were normal.   She has had a neurology work up.  Transcranial Doppler was negative.  She has had a normal brain MRI and head CT.  It was suggested that she be treated for anxiety.    From a cardiac standpoint she had a normal echo in 2015.    She wore an event monitor last year that demonstrated no arrhythmias.    She returns for one year follow up.  She is back at work.  She has three children with the oldest 67.  She has stress.  She does feel this.  She complains of having problems when she tries to exercise.  She wants to go with her coworkers.  She will be on a treadmill in about 15 minutes into it she will feel like her legs are weak and she cannot go on.  She feels lightheaded and dizzy.  Went back and reviewed the event monitor from last year and she had a lot of these complaints but she had normal sinus rhythm.  She was not exercising at that time so we did not capture her exercise-induced complaints.  She has had no syncope.  She has no chest pressure, neck or arm discomfort.  She feels palpitations.  She feels skipping beats but does not necessarily describe resting tachycardia.  She does feel her heart rate racing she thinks exceptionally when she is exercising and she thinks that a proportion to what she is doing.  Past Medical History:  Diagnosis Date  . Anxiety    Panic attacks  . Cervical dysplasia   . GERD (gastroesophageal reflux  disease)   . Obesity   . UTI (urinary tract infection)    recurrent  . Vaginal yeast infection    recurrent  . Vertigo     Past Surgical History:  Procedure Laterality Date  . CESAREAN SECTION  [id number]   11-28-11  . COLPOSCOPY    . TUBAL LIGATION     2013 C-SECTION     No current outpatient medications on file.   No current facility-administered medications for this visit.     Allergies:   Sumatriptan; Levaquin [levofloxacin in d5w]; and Nitrofurantoin monohyd macro    ROS:  Please see the history of present illness.   Otherwise, review of systems are positive for none.   All other systems are reviewed and negative.    PHYSICAL EXAM: VS:  BP 139/90   Pulse 75   Ht 5\' 4"  (1.626 m)   Wt 264 lb (119.7 kg)   BMI 45.32 kg/m  , BMI Body mass index is 45.32 kg/m.  GENERAL:  Well appearing NECK:  No jugular venous distention, waveform within normal limits, carotid upstroke brisk and symmetric, no bruits, no thyromegaly LUNGS:  Clear to auscultation bilaterally CHEST:  Unremarkable HEART:  PMI  not displaced or sustained,S1 and S2 within normal limits, no S3, no S4, no clicks, no rubs, no murmurs ABD:  Flat, positive bowel sounds normal in frequency in pitch, no bruits, no rebound, no guarding, no midline pulsatile mass, no hepatomegaly, no splenomegaly EXT:  2 plus pulses throughout, no edema, no cyanosis no clubbing    EKG:  EKG is not ordered today.    Recent Labs: 03/10/2018: BUN 13; Creatinine, Ser 0.76; Potassium 4.0; Sodium 139 08/12/2018: Hemoglobin 11.9; Platelets 390    Lipid Panel    Component Value Date/Time   CHOL 189 08/25/2013 0845   TRIG 109 08/25/2013 0845   HDL 54 08/25/2013 0845   CHOLHDL 3.5 08/25/2013 0845   VLDL 22 08/25/2013 0845   LDLCALC 113 (H) 08/25/2013 0845      Wt Readings from Last 3 Encounters:  12/23/18 264 lb (119.7 kg)  09/02/18 270 lb 1.9 oz (122.5 kg)  08/12/18 271 lb 8 oz (123.2 kg)      Other studies  Reviewed: Additional studies/ records that were reviewed today include:  Event recorder Review of the above records demonstrates:    ASSESSMENT AND PLAN:  DIZZINESS:    The patient has lots of complaints particularly with exercise.  She had a structurally normal heart on echo.  She had no arrhythmias on monitor.  Since her symptoms are mostly exercise-induced I would like to do an a maximal POET (Plain Old Exercise Treadmill).  If this does not reproduce any arrhythmias or drop in blood pressure or other obvious etiology I would not suggest further cardiac work-up.  We had a long discussion about the role of stress in all of this.   OBESITY:  We discussed a slow and rational exercise diet plan.    Current medicines are reviewed at length with the patient today.  The patient does not have concerns regarding medicines.  The following changes have been made:  None  Labs/ tests ordered today include: None  Orders Placed This Encounter  Procedures  . TSH  . EXERCISE TOLERANCE TEST (ETT)     Disposition:   FU with me as needed.     Signed, Rollene Rotunda, MD  12/23/2018 9:41 AM    [facility]

## 2018-12-23 ENCOUNTER — Ambulatory Visit: Payer: BLUE CROSS/BLUE SHIELD | Admitting: Cardiology

## 2018-12-23 ENCOUNTER — Encounter: Payer: Self-pay | Admitting: Cardiology

## 2018-12-23 VITALS — BP 139/90 | HR 75 | Ht 64.0 in | Wt 264.0 lb

## 2018-12-23 DIAGNOSIS — R42 Dizziness and giddiness: Secondary | ICD-10-CM

## 2018-12-23 DIAGNOSIS — R002 Palpitations: Secondary | ICD-10-CM

## 2018-12-23 DIAGNOSIS — R000 Tachycardia, unspecified: Secondary | ICD-10-CM

## 2018-12-23 LAB — TSH: TSH: 3.23 u[IU]/mL (ref 0.450–4.500)

## 2018-12-23 NOTE — Patient Instructions (Signed)
Medication Instructions:  Continue current medications  If you need a refill on your cardiac medications before your next appointment, please call your pharmacy.  Labwork: TSH Today HERE IN OUR OFFICE AT [facility] You will NOT need to fast   Take the provided lab slips with you to the lab for your blood draw.   When you have your labs (blood work) drawn today and your tests are completely normal, you will receive your results only by MyChart Message (if you have MyChart) -OR-  A paper copy in the mail.  If you have any lab test that is abnormal or we need to change your treatment, we will call you to review these results.  Testing/Procedures: Your physician has requested that you have an exercise tolerance test. For further information please visit [email or web address] Please also follow instruction sheet, as given.  Follow-Up: . Your physician recommends that you schedule a follow-up appointment in: As Needed   At [facility], you and your health needs are our priority.  As part of our continuing mission to provide you with exceptional heart care, we have created designated Provider Care Teams.  These Care Teams include your primary Cardiologist (physician) and Advanced Practice Providers (APPs -  Physician Assistants and Nurse Practitioners) who all work together to provide you with the care you need, when you need it.  Thank you for choosing [facility] at [facility]!!

## 2018-12-24 ENCOUNTER — Encounter: Payer: Self-pay | Admitting: *Deleted

## 2018-12-24 ENCOUNTER — Telehealth: Payer: Self-pay | Admitting: *Deleted

## 2018-12-24 NOTE — Telephone Encounter (Signed)
-----   Message from Rollene Rotunda, MD sent at 12/23/2018  4:51 PM EST ----- TSH is normal.  Call Ms. Dunston with the results and send results to Corwin Levins, MD

## 2018-12-24 NOTE — Telephone Encounter (Signed)
Unable to leave voice message. Mailbox full

## 2018-12-27 ENCOUNTER — Telehealth (HOSPITAL_COMMUNITY): Payer: Self-pay

## 2018-12-27 NOTE — Telephone Encounter (Signed)
Encounter complete. 

## 2018-12-31 ENCOUNTER — Telehealth (HOSPITAL_COMMUNITY): Payer: Self-pay

## 2018-12-31 NOTE — Telephone Encounter (Signed)
Encounter complete. 

## 2019-01-01 ENCOUNTER — Ambulatory Visit (HOSPITAL_COMMUNITY)
Admission: RE | Admit: 2019-01-01 | Discharge: 2019-01-01 | Disposition: A | Discharge status: Home / Self Care | Payer: BLUE CROSS/BLUE SHIELD | Source: Ambulatory Visit | Attending: Cardiology | Admitting: Cardiology

## 2019-01-01 DIAGNOSIS — R000 Tachycardia, unspecified: Secondary | ICD-10-CM

## 2019-01-01 LAB — EXERCISE TOLERANCE TEST
Estimated workload: 4.6 METS
Exercise duration (min): 2 min
Exercise duration (sec): 15 s
MPHR: 183 {beats}/min
Peak HR: 142 {beats}/min
Percent HR: 77 %
RPE: 17
Rest HR: 82 {beats}/min

## 2019-01-28 ENCOUNTER — Encounter: Payer: Self-pay | Admitting: Women's Health

## 2019-01-28 ENCOUNTER — Other Ambulatory Visit: Payer: Self-pay | Admitting: Women's Health

## 2019-01-28 MED ORDER — SULFAMETHOXAZOLE-TRIMETHOPRIM 800-160 MG PO TABS: 1.0000 | ORAL_TABLET | Freq: Two times a day (BID) | ORAL | 0 refills | Status: DC

## 2019-01-28 NOTE — Telephone Encounter (Signed)
Telephone call, states saw blood in her urine today has had some increased burning for the last 2 days which has increased and now has pain at end of stream of urination.  Denies back pain, nausea or fever.  Married, same partner.  History of BTL monthly cycles.  Allergies to Levaquin and Macrobid.  Septra twice daily for 3 days E scribed.  Instructed to schedule annual exam which is overdue and increase water aware of UTI prevention.  Instructed to call if no relief of symptoms.

## 2019-01-30 ENCOUNTER — Encounter: Payer: Self-pay | Admitting: Women's Health

## 2019-01-30 MED ORDER — SULFAMETHOXAZOLE-TRIMETHOPRIM 800-160 MG PO TABS: 1.0000 | ORAL_TABLET | Freq: Two times a day (BID) | ORAL | 0 refills | Status: DC

## 2019-01-30 NOTE — Telephone Encounter (Signed)
You are back up MD) nancy treated patient via phone call on 01/28/19 Julie Mcneil call states "Telephone call, states saw blood in her urine today has had some increased burning for the last 2 days which has increased and now has pain at end of stream of urination.  Denies back pain, nausea or fever.  Married, same partner.  History of BTL monthly cycles.  Allergies to Levaquin and Macrobid.  Septra twice daily for 3 days E scribed.  Instructed to schedule annual exam which is overdue and increase water aware of UTI prevention.  Instructed to call if no relief of symptoms.  Please advise

## 2019-02-02 ENCOUNTER — Encounter: Payer: Self-pay | Admitting: Women's Health

## 2019-02-02 ENCOUNTER — Telehealth: Payer: Self-pay | Admitting: Obstetrics and Gynecology

## 2019-02-02 MED ORDER — CIPROFLOXACIN HCL 500 MG PO TABS: 500.0000 mg | ORAL_TABLET | Freq: Two times a day (BID) | ORAL | 0 refills | Status: DC

## 2019-02-02 NOTE — Telephone Encounter (Signed)
Phone call from patient after hours.   States she is having urethral pulsating pain with urination and that the Bactrim she has been on for the last 5 days is not working.   Treated by phone with 3 day course of Bactrim DS. Symptoms did not resolve, and her prescription was extended.  Now taking AZO OTC 2 tabs at a time, and still having pain.  States she gets UTIs all the time and that Bactrim usually works for her.  Reports lower abdominal aching. Has nausea after taking AZO.  Denies fever, back pain, and vomiting.   She declines visit to acute care setting for further evaluation and treatment due to concern about Coronavirus.   I agreed to sending a prescription of Ciprofloxacin 500 mg po bid x 2 tabs to her pharmacy until she can be seen in her regular GYN office tomorrow.   Hydrate well.  Ok to continue AZO 200 mg po tid prn.

## 2019-02-03 ENCOUNTER — Other Ambulatory Visit: Payer: Self-pay

## 2019-02-03 ENCOUNTER — Ambulatory Visit (INDEPENDENT_AMBULATORY_CARE_PROVIDER_SITE_OTHER): Payer: BLUE CROSS/BLUE SHIELD | Admitting: Women's Health

## 2019-02-03 ENCOUNTER — Encounter: Payer: Self-pay | Admitting: Women's Health

## 2019-02-03 VITALS — BP 118/78

## 2019-02-03 DIAGNOSIS — F419 Anxiety disorder, unspecified: Secondary | ICD-10-CM

## 2019-02-03 DIAGNOSIS — R350 Frequency of micturition: Secondary | ICD-10-CM

## 2019-02-03 DIAGNOSIS — N898 Other specified noninflammatory disorders of vagina: Secondary | ICD-10-CM | POA: Diagnosis not present

## 2019-02-03 LAB — WET PREP FOR TRICH, YEAST, CLUE

## 2019-02-03 MED ORDER — CIPROFLOXACIN HCL 500 MG PO TABS: 500.0000 mg | ORAL_TABLET | Freq: Two times a day (BID) | ORAL | 0 refills | Status: DC

## 2019-02-03 MED ORDER — ALPRAZOLAM 0.25 MG PO TABS: 0.2500 mg | ORAL_TABLET | Freq: Every evening | ORAL | 1 refills | Status: DC | PRN

## 2019-02-03 NOTE — Progress Notes (Signed)
38 year old MWF G4, P3 presents with complaint of urinary urgency, frequency, slight back pain for the past week.  01/28/2019 called the office reporting questionable blood in her urine with burning, pain at end of stream of urination and frequency.  Was prescribed Septra twice daily for 3 days, history of UTIs and recurrent yeast.  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  Marissa 13 struggling with some anxiety, self cutting is currently in therapy. Denies fever, visible blood, pain at end of stream of urination or vaginal discharge today.  Has completed 5 days of Septra twice daily with no improvement .02/02/2019 Dr. Edward Jolly, Dr. on call prescribed 1 day of Cipro 500 twice daily and was instructed to follow-up for office appointment.  States did feel improvement with the Cipro.  Reports  had intense pain with frequency last evening, much better today.  Monthly cycle/vasectomy.  Exam: Appears well, no CVAT.  Abdomen obese, nontender, external genitalia within normal limits, speculum exam scant discharge wet prep negative.  Bimanual no CMT or adnexal tenderness. UA: +3 blood, trace leukocytes, 10-20 WBCs, 20-40 RBCs, moderate bacteria, 6-10 squamous epithelials,  Unresolved UTI Anxiety/panic  Plan: Septra pro 500 twice daily for 3 days.  Instructed to call if continued symptoms.  UTI prevention discussed.  Culture pending.  Continue counseling as needed.  Xanax 0.25 prescription, proper use, reviewed addictive properties and to use sparingly.

## 2019-02-03 NOTE — Telephone Encounter (Signed)
TC  Back pain, constant urgency, no fever, no pain at end of stream, was in horrible pain last night better today. OV to check urine

## 2019-02-03 NOTE — Telephone Encounter (Signed)
Patient called and scheduled OV today.

## 2019-02-03 NOTE — Patient Instructions (Signed)

## 2019-02-04 ENCOUNTER — Encounter: Payer: Self-pay | Admitting: Internal Medicine

## 2019-02-05 LAB — URINALYSIS, COMPLETE W/RFL CULTURE
Bilirubin Urine: NEGATIVE
Glucose, UA: NEGATIVE
Hyaline Cast: NONE SEEN /LPF
Ketones, ur: NEGATIVE
Nitrites, Initial: NEGATIVE
Protein, ur: NEGATIVE
Specific Gravity, Urine: 1.02 (ref 1.001–1.03)
pH: 5.5 (ref 5.0–8.0)

## 2019-02-05 LAB — URINE CULTURE
MICRO NUMBER:: 365304
Result:: NO GROWTH
SPECIMEN QUALITY:: ADEQUATE

## 2019-02-05 LAB — CULTURE INDICATED

## 2019-02-12 ENCOUNTER — Telehealth: Payer: BLUE CROSS/BLUE SHIELD | Admitting: Nurse Practitioner

## 2019-02-12 DIAGNOSIS — J019 Acute sinusitis, unspecified: Secondary | ICD-10-CM

## 2019-02-12 DIAGNOSIS — B9789 Other viral agents as the cause of diseases classified elsewhere: Secondary | ICD-10-CM

## 2019-02-12 MED ORDER — FLUTICASONE PROPIONATE 50 MCG/ACT NA SUSP: 2.0000 | Freq: Every day | NASAL | 6 refills | Status: DC

## 2019-02-12 NOTE — Progress Notes (Signed)
We are sorry that you are not feeling well.  Here is how we plan to help!  Based on what you have shared with me it looks like you have sinusitis.  Sinusitis is inflammation and infection in the sinus cavities of the head.  Based on your presentation I believe you most likely have Acute Viral Sinusitis.This is an infection most likely caused by a virus. There is not specific treatment for viral sinusitis other than to help you with the symptoms until the infection runs its course.  You may use an oral decongestant such as Mucinex D or if you have glaucoma or high blood pressure use plain Mucinex. Saline nasal spray help and can safely be used as often as needed for congestion, I have prescribed: Fluticasone nasal spray two sprays in each nostril once a day   * this is all I can recommend at this time. I am not able to make any othe rdiagnosis without seeing you. You will just need to contact your PCP if this continues  Some authorities believe that zinc sprays or the use of Echinacea may shorten the course of your symptoms.  Sinus infections are not as easily transmitted as other respiratory infection, however we still recommend that you avoid close contact with loved ones, especially the very young and elderly.  Remember to wash your hands thoroughly throughout the day as this is the number one way to prevent the spread of infection!  Home Care:  Only take medications as instructed by your medical team.  Do not take these medications with alcohol.  A steam or ultrasonic humidifier can help congestion.  You can place a towel over your head and breathe in the steam from hot water coming from a faucet.  Avoid close contacts especially the very young and the elderly.  Cover your mouth when you cough or sneeze.  Always remember to wash your hands.  Get Help Right Away If:  You develop worsening fever or sinus pain.  You develop a severe head ache or visual changes.  Your symptoms persist  after you have completed your treatment plan.  Make sure you  Understand these instructions.  Will watch your condition.  Will get help right away if you are not doing well or get worse.  Your e-visit answers were reviewed by a board certified advanced clinical practitioner to complete your personal care plan.  Depending on the condition, your plan could have included both over the counter or prescription medications.  If there is a problem please reply  once you have received a response from your provider.  Your safety is important to [place].  If you have drug allergies check your prescription carefully.    You can use MyChart to ask questions about today's visit, request a non-urgent call back, or ask for a work or school excuse for 24 hours related to this e-Visit. If it has been greater than 24 hours you will need to follow up with your provider, or enter a new e-Visit to address those concerns.  You will get an e-mail in the next two days asking about your experience.  I hope that your e-visit has been valuable and will speed your recovery. Thank you for using e-visits.  5 minutes spent reviewing and documenting in chart.

## 2019-02-20 ENCOUNTER — Encounter: Payer: Self-pay | Admitting: Internal Medicine

## 2019-02-20 ENCOUNTER — Ambulatory Visit (INDEPENDENT_AMBULATORY_CARE_PROVIDER_SITE_OTHER): Payer: Self-pay | Admitting: Internal Medicine

## 2019-02-20 DIAGNOSIS — R739 Hyperglycemia, unspecified: Secondary | ICD-10-CM

## 2019-02-20 DIAGNOSIS — F411 Generalized anxiety disorder: Secondary | ICD-10-CM

## 2019-02-20 DIAGNOSIS — F410 Panic disorder [episodic paroxysmal anxiety] without agoraphobia: Secondary | ICD-10-CM

## 2019-02-20 MED ORDER — ALPRAZOLAM 1 MG PO TABS: ORAL_TABLET | ORAL | 0 refills | Status: DC

## 2019-02-20 MED ORDER — CITALOPRAM HYDROBROMIDE 20 MG PO TABS: 20.0000 mg | ORAL_TABLET | Freq: Every day | ORAL | 3 refills | Status: DC

## 2019-02-20 NOTE — Assessment & Plan Note (Signed)
stable overall by history and exam, recent data reviewed with pt, and pt to continue medical treatment as before,  to f/u any worsening symptoms or concerns  

## 2019-02-20 NOTE — Assessment & Plan Note (Signed)
Ok for celexa 20 qd,  to f/u any worsening symptoms or concerns

## 2019-02-20 NOTE — Patient Instructions (Signed)
Please take all new medication as prescribed - the xanax as needed, and celexa 20 qd  Please continue all other medications as before, and refills have been done if requested.  Please have the pharmacy call with any other refills you may need.  Please continue your efforts at being more active, low cholesterol diet, and weight control.  Please keep your appointments with your specialists as you may have planned

## 2019-02-20 NOTE — Assessment & Plan Note (Signed)
New onset, for xanax 1 mg 1/2 - 1 tab prn,  to f/u any worsening symptoms or concerns , declines counseling referral or psychiatry

## 2019-02-20 NOTE — Progress Notes (Signed)
Patient ID: Julie Mcneil, female   DOB: 11/19/1980, 38 y.o.   MRN: [id number]  Virtual Visit via Video Note  I connected with Julie Mcneil on 02/20/19 at  2:00 PM EDT by a video enabled telemedicine application and verified that I am speaking with the correct person using two identifiers. Pt is in vehicle with husband driving, child in back seat, no other persons present   I discussed the limitations of evaluation and management by telemedicine and the availability of in person appointments. The patient expressed understanding and agreed to proceed.  History of Present Illness: 38yo F with hx of anxiety with mild increase overall in past few months, and has resisted suggestions of ssri in past.  Has had marked increase in symptoms in the last 3 wks with the pandemic now staying at home, doing home schooling, very worried about the future and this AM had a panic attack characterized by shaky, out of control feeling with chest fullness and sob, almost went to ED, called a friend who told her to wait 5 min and take deep breaths which seemed to help  Current xanax 0.25 qhs prn not working well, and is concerned about getting hooked as well.   Pt denies polydipsia, polyuria,   Past Medical History:  Diagnosis Date  . Anxiety    Panic attacks  . Cervical dysplasia   . GERD (gastroesophageal reflux disease)   . Obesity   . UTI (urinary tract infection)    recurrent  . Vaginal yeast infection    recurrent  . Vertigo    Past Surgical History:  Procedure Laterality Date  . CESAREAN SECTION  [id number],2011   11-28-11  . COLPOSCOPY    . TUBAL LIGATION     2013 C-SECTION    reports that she has never smoked. She has never used smokeless tobacco. She reports current alcohol use. She reports that she does not use drugs. family history includes Cancer in her maternal grandmother; Diabetes in her maternal grandmother; Early death in her mother; Heart disease in her maternal grandmother; Hypertension  in her paternal grandfather and paternal grandmother; Stroke in her paternal grandmother. Allergies  Allergen Reactions  . Sumatriptan Shortness Of Breath    Tight chest  . Levaquin [Levofloxacin In D5w] Nausea Only  . Nitrofurantoin Monohyd Macro Hives and Nausea And Vomiting   Current Outpatient Medications on File Prior to Visit  Medication Sig Dispense Refill  . ALPRAZolam (XANAX) 0.25 MG tablet Take 1 tablet (0.25 mg total) by mouth at bedtime as needed for anxiety. 30 tablet 1  . ciprofloxacin (CIPRO) 500 MG tablet Take 1 tablet (500 mg total) by mouth 2 (two) times daily. 6 tablet 0  . fluticasone (FLONASE) 50 MCG/ACT nasal spray Place 2 sprays into both nostrils daily. 16 g 6   No current facility-administered medications on file prior to visit.     Observations/Objective: Alert, mentating well, 2+ nervous appearing and a bit wide eyed, somewhat tremulous, not ill appearing, cn 2-12 intact, moves all 4s, no visible rash or swelling  Assessment and Plan: See notes  Follow Up Instructions: See notes   I discussed the assessment and treatment plan with the patient. The patient was provided an opportunity to ask questions and all were answered. The patient agreed with the plan and demonstrated an understanding of the instructions.   The patient was advised to call back or seek an in-person evaluation if the symptoms worsen or if the condition fails to improve as  anticipated.   Oliver Barre, MD

## 2019-02-27 ENCOUNTER — Encounter: Payer: Self-pay | Admitting: Internal Medicine

## 2019-02-27 ENCOUNTER — Ambulatory Visit: Payer: Self-pay

## 2019-02-27 NOTE — Telephone Encounter (Signed)
Pt called and said that she was having a tight feeling that started in her jaw and went to her neck and upper chest.  She states that she has had this symptom for several weeks to a month.  She states that she has seen Dr Jonny Ruiz and she was given medication for anxiety.  She states she has a history of anxiety attacks.  She states that she has had a full cardiac work up and everything was ok with her heart.  She feels her symptoms are coming form being at home all day. She has recently started Celexa. She states she has only taken the medication since Saturday. Pt denies feeling of suicidal or homicidal thoughts.  She has her husband as support. Care advice read to patient. Pt verbalized understanding of all instructions. Pt told note would be routed to office for appointment. Pt verbalized understanding. E-mail verified  Reason for Disposition . [1] Started on anti-anxiety medication AND [2] no relief  Answer Assessment - Initial Assessment Questions 1. LOCATION: "Where does it hurt?"       Tightness that goes from jaw into chest both sides 2. RADIATION: "Does the pain go anywhere else?" (e.g., into neck, jaw, arms, back)     back 3. ONSET: "When did the chest pain begin?" (Minutes, hours or days)      2 weeks ago 4. PATTERN "Does the pain come and go, or has it been constant since it started?"  "Does it get worse with exertion?"      Comes and goes 5. DURATION: "How long does it last" (e.g., seconds, minutes, hours)     Most of the day 6. SEVERITY: "How bad is the pain?"  (e.g., Scale 1-10; mild, moderate, or severe)    - MILD (1-3): doesn't interfere with normal activities     - MODERATE (4-7): interferes with normal activities or awakens from sleep    - SEVERE (8-10): excruciating pain, unable to do any normal activities       3-4 7. CARDIAC RISK FACTORS: "Do you have any history of heart problems or risk factors for heart disease?" (e.g., prior heart attack, angina; high blood pressure,  diabetes, being overweight, high cholesterol, smoking, or strong family history of heart disease)    BP up and down 8. PULMONARY RISK FACTORS: "Do you have any history of lung disease?"  (e.g., blood clots in lung, asthma, emphysema, birth control pills)    no 9. CAUSE: "What do you think is causing the chest pain?"     anxiety 10. OTHER SYMPTOMS: "Do you have any other symptoms?" (e.g., dizziness, nausea, vomiting, sweating, fever, difficulty breathing, cough)       Nausea sensation when she swallows, palms get sweaty 11. PREGNANCY: "Is there any chance you are pregnant?" "When was your last menstrual period?"       No tubal ligation  Answer Assessment - Initial Assessment Questions 1. CONCERN: "What happened that made you call today?"     Sensation to body feels overthinks 2. ANXIETY SYMPTOM SCREENING: "Can you describe how you have been feeling?"  (e.g., tense, restless, panicky, anxious, keyed up, trouble sleeping, trouble concentrating)     Tense neck jaw back 3. ONSET: "How long have you been feeling this way?"     2 weeks ago to 1 month 4. RECURRENT: "Have you felt this way before?"  If yes: "What happened that time?" "What helped these feelings go away in the past?"     Yes saw doctor  got medication 5. RISK OF HARM - SUICIDAL IDEATION:  "Do you ever have thoughts of hurting or killing yourself?"  (e.g., yes, no, no but preoccupation with thoughts about death)   - INTENT:  "Do you have thoughts of hurting or killing yourself right NOW?" (e.g., yes, no, N/A)   - PLAN: "Do you have a specific plan for how you would do this?" (e.g., gun, knife, overdose, no plan, N/A)    No  6. RISK OF HARM - HOMICIDAL IDEATION:  "Do you ever have thoughts of hurting or killing someone else?"  (e.g., yes, no, no but preoccupation with thoughts about death)   - INTENT:  "Do you have thoughts of hurting or killing someone right NOW?" (e.g., yes, no, N/A)   - PLAN: "Do you have a specific plan for how you  would do this?" (e.g., gun, knife, no plan, N/A)     no 7. FUNCTIONAL IMPAIRMENT: "How have things been going for you overall in your life? Have you had any more difficulties than usual doing your normal daily activities?"  (e.g., better, same, worse; self-care, school, work, interactions)    Do daily chores but stay in bed too 8. SUPPORT: "Who is with you now?" "Who do you live with?" "Do you have family or friends nearby who you can talk to?"     husband 9. THERAPIST: "Do you have a counselor or therapist? Name?"     no 10. STRESSORS: "Has there been any new stress or recent changes in your life?"      The world situation 59. CAFFEINE ABUSE: "Do you drink caffeinated beverages, and how much each day?" (e.g., coffee, tea, colas)       No 1 8oz can of soda per day 12. SUBSTANCE ABUSE: "Do you use any illegal drugs or alcohol?"       no 13. OTHER SYMPTOMS: "Do you have any other physical symptoms right now?" (e.g., chest pain, palpitations, difficulty breathing, fever)       Tightness in her jaw and neck 14. PREGNANCY: "Is there any chance you are pregnant?" "When was your last menstrual period?"      No tubal  Protocols used: ANXIETY AND PANIC ATTACK-A-AH, CHEST PAIN-A-AH

## 2019-02-28 NOTE — Telephone Encounter (Signed)
I have responded to pt mychart message. thanks

## 2019-02-28 NOTE — Telephone Encounter (Signed)
Please advise 

## 2019-09-03 ENCOUNTER — Encounter: Payer: Self-pay | Admitting: Women's Health

## 2019-09-03 ENCOUNTER — Telehealth: Payer: Self-pay | Admitting: Nurse Practitioner

## 2019-09-03 DIAGNOSIS — J039 Acute tonsillitis, unspecified: Secondary | ICD-10-CM

## 2019-09-03 DIAGNOSIS — J029 Acute pharyngitis, unspecified: Secondary | ICD-10-CM

## 2019-09-03 MED ORDER — AMOXICILLIN 500 MG PO CAPS: 500.0000 mg | ORAL_CAPSULE | Freq: Two times a day (BID) | ORAL | 0 refills | Status: DC

## 2019-09-03 NOTE — Progress Notes (Signed)

## 2019-09-09 ENCOUNTER — Ambulatory Visit (INDEPENDENT_AMBULATORY_CARE_PROVIDER_SITE_OTHER): Payer: 59 | Admitting: Women's Health

## 2019-09-09 ENCOUNTER — Other Ambulatory Visit: Payer: Self-pay

## 2019-09-09 ENCOUNTER — Encounter: Payer: Self-pay | Admitting: Women's Health

## 2019-09-09 VITALS — BP 132/80 | Ht 64.0 in | Wt 282.0 lb

## 2019-09-09 DIAGNOSIS — Z01419 Encounter for gynecological examination (general) (routine) without abnormal findings: Secondary | ICD-10-CM

## 2019-09-09 NOTE — Progress Notes (Signed)
Julie Mcneil 09-29-1981 [id number]    History:    Presents for annual exam.  Regular monthly 5 to 6-day cycle/BTL.  Normal Pap history.   GERD, anxiety stable on Celexa, obesity.  Primary care manages labs.   Recent strep throat primary care treated with amoxicillin.  Past medical history, past surgical history, family history and social history were all reviewed and documented in the EPIC chart.  Works for an [employer].  Having some marital issues has had counseling, denies abuse or infidelity.3 daughters youngest 23 oldest 70 all doing well.  ROS:  A ROS was performed and pertinent positives and negatives are included.  Exam:  Vitals:   09/09/19 1410  BP: 132/80  Weight: 282 lb (127.9 kg)  Height: 5\' 4"  (1.626 m)   Body mass index is 48.41 kg/m.   General appearance:  Normal Thyroid:  Symmetrical, normal in size, without palpable masses or nodularity. Respiratory  Auscultation:  Clear without wheezing or rhonchi Cardiovascular  Auscultation:  Regular rate, without rubs, murmurs or gallops  Edema/varicosities:  Not grossly evident Abdominal  Soft,nontender, without masses, guarding or rebound.  Liver/spleen:  No organomegaly noted  Hernia:  None appreciated  Skin  Inspection:  Grossly normal   Breasts: Examined lying and sitting.     Right: Without masses, retractions, discharge or axillary adenopathy.     Left: Without masses, retractions, discharge or axillary adenopathy. Gentitourinary   Inguinal/mons:  Normal without inguinal adenopathy  External genitalia:  Normal  BUS/Urethra/Skene's glands:  Normal  Vagina:  Normal  Cervix:  Normal  Uterus:  normal in size, shape and contour.  Midline and mobile  Adnexa/parametria:     Rt: Without masses or tenderness.   Lt: Without masses or tenderness.  Anus and perineum: Normal  Digital rectal exam: Normal sphincter tone without palpated masses or tenderness  Assessment/Plan:  38 y.o. MWF G4, P3 for annual exam  with no GYN complaints.  Regular monthly cycle/BTL Obesity Situational stress/marital issues Anxiety/depression stable on Celexa-primary care manages labs and meds   Plan: Strongly encouraged to continue counseling, self-care, leisure activities.  SBEs, increase regular cardio type exercise, decrease calorie/carbs, calcium rich foods, MVI daily encouraged.  Obesity discussed, [facility].  States has done weight watchers in the past and does lose weight but does tend to gain it back.  Paps normal 2018, new screening guidelines reviewed.    Huel Cote [facility], 2:31 PM 09/09/2019

## 2019-09-09 NOTE — Patient Instructions (Signed)
Good to see you today,   Health Maintenance, Female Adopting a healthy lifestyle and getting preventive care are important in promoting health and wellness. Ask your health care provider about:  The right schedule for you to have regular tests and exams.  Things you can do on your own to prevent diseases and keep yourself healthy. What should I know about diet, weight, and exercise? Eat a healthy diet   Eat a diet that includes plenty of vegetables, fruits, low-fat dairy products, and lean protein.  Do not eat a lot of foods that are high in solid fats, added sugars, or sodium. Maintain a healthy weight Body mass index (BMI) is used to identify weight problems. It estimates body fat based on height and weight. Your health care provider can help determine your BMI and help you achieve or maintain a healthy weight. Get regular exercise Get regular exercise. This is one of the most important things you can do for your health. Most adults should:  Exercise for at least 150 minutes each week. The exercise should increase your heart rate and make you sweat (moderate-intensity exercise).  Do strengthening exercises at least twice a week. This is in addition to the moderate-intensity exercise.  Spend less time sitting. Even light physical activity can be beneficial. Watch cholesterol and blood lipids Have your blood tested for lipids and cholesterol at 38 years of age, then have this test every 5 years. Have your cholesterol levels checked more often if:  Your lipid or cholesterol levels are high.  You are older than 38 years of age.  You are at high risk for heart disease. What should I know about cancer screening? Depending on your health history and family history, you may need to have cancer screening at various ages. This may include screening for:  Breast cancer.  Cervical cancer.  Colorectal cancer.  Skin cancer.  Lung cancer. What should I know about heart disease,  diabetes, and high blood pressure? Blood pressure and heart disease  High blood pressure causes heart disease and increases the risk of stroke. This is more likely to develop in people who have high blood pressure readings, are of African descent, or are overweight.  Have your blood pressure checked: ? Every 3-5 years if you are 22-27 years of age. ? Every year if you are 36 years old or older. Diabetes Have regular diabetes screenings. This checks your fasting blood sugar level. Have the screening done:  Once every three years after age 59 if you are at a normal weight and have a low risk for diabetes.  More often and at a younger age if you are overweight or have a high risk for diabetes. What should I know about preventing infection? Hepatitis B If you have a higher risk for hepatitis B, you should be screened for this virus. Talk with your health care provider to find out if you are at risk for hepatitis B infection. Hepatitis C Testing is recommended for:  Everyone born from [birth year] through 1965.  Anyone with known risk factors for hepatitis C. Sexually transmitted infections (STIs)  Get screened for STIs, including gonorrhea and chlamydia, if: ? You are sexually active and are younger than 38 years of age. ? You are older than 38 years of age and your health care provider tells you that you are at risk for this type of infection. ? Your sexual activity has changed since you were last screened, and you are at increased risk for chlamydia or  gonorrhea. Ask your health care provider if you are at risk.  Ask your health care provider about whether you are at high risk for HIV. Your health care provider may recommend a prescription medicine to help prevent HIV infection. If you choose to take medicine to prevent HIV, you should first get tested for HIV. You should then be tested every 3 months for as long as you are taking the medicine. Pregnancy  If you are about to stop having your  period (premenopausal) and you may become pregnant, seek counseling before you get pregnant.  Take 400 to 800 micrograms (mcg) of folic acid every day if you become pregnant.  Ask for birth control (contraception) if you want to prevent pregnancy. Osteoporosis and menopause Osteoporosis is a disease in which the bones lose minerals and strength with aging. This can result in bone fractures. If you are 5 years old or older, or if you are at risk for osteoporosis and fractures, ask your health care provider if you should:  Be screened for bone loss.  Take a calcium or vitamin D supplement to lower your risk of fractures.  Be given hormone replacement therapy (HRT) to treat symptoms of menopause. Follow these instructions at home: Lifestyle  Do not use any products that contain nicotine or tobacco, such as cigarettes, e-cigarettes, and chewing tobacco. If you need help quitting, ask your health care provider.  Do not use street drugs.  Do not share needles.  Ask your health care provider for help if you need support or information about quitting drugs. Alcohol use  Do not drink alcohol if: ? Your health care provider tells you not to drink. ? You are pregnant, may be pregnant, or are planning to become pregnant.  If you drink alcohol: ? Limit how much you use to 0-1 drink a day. ? Limit intake if you are breastfeeding.  Be aware of how much alcohol is in your drink. In the [place] one drink equals one 12 oz bottle of beer (355 mL), one 5 oz glass of wine (148 mL), or one 1 oz glass of hard liquor (44 mL). General instructions  Schedule regular health, dental, and eye exams.  Stay current with your vaccines.  Tell your health care provider if: ? You often feel depressed. ? You have ever been abused or do not feel safe at home. Summary  Adopting a healthy lifestyle and getting preventive care are important in promoting health and wellness.  Follow your health care provider's  instructions about healthy diet, exercising, and getting tested or screened for diseases.  Follow your health care provider's instructions on monitoring your cholesterol and blood pressure. This information is not intended to replace advice given to you by your health care provider. Make sure you discuss any questions you have with your health care provider. Document Released: 05/08/2011 Document Revised: 10/16/2018 Document Reviewed: 10/16/2018 Elsevier Patient Education  2020 [employer].

## 2019-09-10 LAB — URINALYSIS, COMPLETE W/RFL CULTURE
Bacteria, UA: NONE SEEN /HPF
Bilirubin Urine: NEGATIVE
Glucose, UA: NEGATIVE
Hyaline Cast: NONE SEEN /LPF
Ketones, ur: NEGATIVE
Leukocyte Esterase: NEGATIVE
Nitrites, Initial: NEGATIVE
Protein, ur: NEGATIVE
RBC / HPF: NONE SEEN /HPF (ref 0–2)
Specific Gravity, Urine: 1.018 (ref 1.001–1.03)
Squamous Epithelial / LPF: NONE SEEN /HPF (ref <=5)
WBC, UA: NONE SEEN /HPF (ref 0–5)
pH: <=5 (ref 5.0–8.0)

## 2019-09-10 LAB — NO CULTURE INDICATED

## 2019-09-15 ENCOUNTER — Telehealth: Payer: Self-pay | Admitting: Physician Assistant

## 2019-09-15 DIAGNOSIS — R399 Unspecified symptoms and signs involving the genitourinary system: Secondary | ICD-10-CM

## 2019-09-15 MED ORDER — SULFAMETHOXAZOLE-TRIMETHOPRIM 800-160 MG PO TABS: 1.0000 | ORAL_TABLET | Freq: Two times a day (BID) | ORAL | 0 refills | Status: DC

## 2019-09-15 NOTE — Progress Notes (Signed)
We are sorry that you are not feeling well.  Here is how we plan to help!  Based on what you shared with me it looks like you most likely have a simple urinary tract infection.  A UTI (Urinary Tract Infection) is a bacterial infection of the bladder.  Most cases of urinary tract infections are simple to treat but a key part of your care is to encourage you to drink plenty of fluids and watch your symptoms carefully.  I have prescribed Bactrim DS One tablet twice a day for 5 days.  Your symptoms should gradually improve. Call us if the burning in your urine worsens, you develop worsening fever, back pain or pelvic pain or if your symptoms do not resolve after completing the antibiotic.  Urinary tract infections can be prevented by drinking plenty of water to keep your body hydrated.  Also be sure when you wipe, wipe from front to back and don't hold it in!  If possible, empty your bladder every 4 hours.  Your e-visit answers were reviewed by a board certified advanced clinical practitioner to complete your personal care plan.  Depending on the condition, your plan could have included both over the counter or prescription medications.  If there is a problem please reply  once you have received a response from your provider.  Your safety is important to us.  If you have drug allergies check your prescription carefully.    You can use MyChart to ask questions about today's visit, request a non-urgent call back, or ask for a work or school excuse for 24 hours related to this e-Visit. If it has been greater than 24 hours you will need to follow up with your provider, or enter a new e-Visit to address those concerns.   You will get an e-mail in the next two days asking about your experience.  I hope that your e-visit has been valuable and will speed your recovery. Thank you for using e-visits.   Greater than 5 minutes, yet less than 10 minutes of time have been spent researching, coordinating and  implementing care for this patient today.   

## 2019-09-16 ENCOUNTER — Other Ambulatory Visit: Payer: Self-pay | Admitting: *Deleted

## 2019-09-16 DIAGNOSIS — Z6841 Body Mass Index (BMI) 40.0 and over, adult: Secondary | ICD-10-CM

## 2019-09-16 NOTE — Progress Notes (Unsigned)
Referral sent to [facility].  Asked for them to contact pt to schedule. Sharrie Rothman CMA

## 2019-09-21 ENCOUNTER — Encounter: Payer: Self-pay | Admitting: Internal Medicine

## 2019-09-22 ENCOUNTER — Telehealth: Payer: Self-pay | Admitting: Physician Assistant

## 2019-09-22 DIAGNOSIS — R21 Rash and other nonspecific skin eruption: Secondary | ICD-10-CM

## 2019-09-22 MED ORDER — TRIAMCINOLONE ACETONIDE 0.1 % EX CREA: 1.0000 "application " | TOPICAL_CREAM | Freq: Two times a day (BID) | CUTANEOUS | 0 refills | Status: DC

## 2019-09-22 MED ORDER — PREDNISONE 10 MG (21) PO TBPK: ORAL_TABLET | Freq: Every day | ORAL | 0 refills | Status: DC

## 2019-09-22 NOTE — Progress Notes (Signed)
E Visit for Rash  We are sorry that you are not feeling well. Here is how we plan to help!  Based on your description of your symptoms, it seems that you may have a dermatitis. I have prescribed prednisone for you to take. I have also prescribed a stronger steroid cream. Make sure that you are avoiding any scented soaps, detergents, lotions or perfumes. You may also use over the counter Eucerin cream or lotion to help with your symptoms.   HOME CARE:   Take cool showers and avoid direct sunlight.  Apply cool compress or wet dressings.  Take a bath in an oatmeal bath.  Sprinkle content of one Aveeno packet under running faucet with comfortably warm water.  Bathe for 15-20 minutes, 1-2 times daily.  Pat dry with a towel. Do not rub the rash.  Use hydrocortisone cream.  Take an antihistamine like Benadryl for widespread rashes that itch.  The adult dose of Benadryl is 25-50 mg by mouth 4 times daily.  Caution:  This type of medication may cause sleepiness.  Do not drink alcohol, drive, or operate dangerous machinery while taking antihistamines.  Do not take these medications if you have prostate enlargement.  Read package instructions thoroughly on all medications that you take.  GET HELP RIGHT AWAY IF:   Symptoms don't go away after treatment.  Severe itching that persists.  If you rash spreads or swells.  If you rash begins to smell.  If it blisters and opens or develops a yellow-brown crust.  You develop a fever.  You have a sore throat.  You become short of breath.  MAKE SURE YOU:  Understand these instructions. Will watch your condition. Will get help right away if you are not doing well or get worse.  Thank you for choosing an e-visit. Your e-visit answers were reviewed by a board certified advanced clinical practitioner to complete your personal care plan. Depending upon the condition, your plan could have included both over the counter or prescription  medications. Please review your pharmacy choice. Be sure that the pharmacy you have chosen is open so that you can pick up your prescription now.  If there is a problem you may message your provider in [place] to have the prescription routed to another pharmacy. Your safety is important to [place]. If you have drug allergies check your prescription carefully.  For the next 24 hours, you can use MyChart to ask questions about today's visit, request a non-urgent call back, or ask for a work or school excuse from your e-visit provider. You will get an email in the next two days asking about your experience. I hope that your e-visit has been valuable and will speed your recovery.  Greater than 5 minutes, yet less than 10 minutes of time have been spend researching, coordinating, and implementing care for this patient today.

## 2019-11-05 ENCOUNTER — Telehealth: Payer: Self-pay | Admitting: Family

## 2019-11-05 ENCOUNTER — Encounter: Payer: Self-pay | Admitting: Internal Medicine

## 2019-11-05 DIAGNOSIS — Z20822 Contact with and (suspected) exposure to covid-19: Secondary | ICD-10-CM

## 2019-11-05 MED ORDER — BENZONATATE 100 MG PO CAPS: 100.0000 mg | ORAL_CAPSULE | Freq: Three times a day (TID) | ORAL | 0 refills | Status: DC | PRN

## 2019-11-05 NOTE — Progress Notes (Signed)
E-Visit for Corona Virus Screening   Your current symptoms could be consistent with the coronavirus.  Many health care providers can now test patients at their office but not all are.  [facility] has multiple testing sites. For information on our COVID testing locations and hours go to [email or web address]  We are enrolling you in our MyChart Home Monitoring for COVID19 . Daily you will receive a questionnaire within the MyChart website. Our COVID 19 response team will be monitoring your responses daily.  Testing Information: The COVID-19 Community Testing sites will begin testing BY APPOINTMENT ONLY.  You can schedule online at [email or web address]  If you do not have access to a smart phone or computer you may call [phone] for an appointment.  Testing Locations: Appointment schedule is 8 am to 3:30 pm at all sites  [place] indoors at [address], [place] [address] [facility]  indoors at [address]trance, [place], [place] [address] [place] indoors at [address], [place] [address]  Additional testing sites in the Community:  . For [facility] Testing sites in [place]  https://www.cvs.com/minuteclinic/covid-19-testing  . For Pop-up testing sites in   https://covid19.ncdhhs.gov/about-covid-19/testing/find-my-testing-place/pop-testing-sites  . For Testing sites with regular hours https://onsms.org/[place]/  . For [place] [facility] https://[facility].com/covid-19-community-outreach-testing/  . For [facility] https://www.[place].gov[place]/human-services/health-department/coronavirus-covid-19-info/covid-19-testing  . For [place] testing in [place] and [place] https://www.[place].gov/[place]/human-services/health-department/coronavirus-covid-19-info/covid-19-testing  . For [facility] testing in [place]   https://[facility].care/covidtesting  For  more  information about community testing call [phone]   We are enrolling you in our MyChart Home Monitoring for COVID19 . Daily you will receive a questionnaire within the MyChart website. Our COVID 19 response team will be monitoring your responses daily.  Please quarantine yourself while awaiting your test results. If you develop fever/cough/breathlessness, please stay home for 10 days with improving symptoms and until you have had 24 hours of no fever (without taking a fever reducer).  You should wear a mask or cloth face covering over your nose and mouth if you must be around other people or animals, including pets (even at home). Try to stay at least 6 feet away from other people. This will protect the people around you.  Please continue good preventive care measures, including:  frequent hand-washing, avoid touching your face, cover coughs/sneezes, stay out of crowds and keep a 6 foot distance from others.  COVID-19 is a respiratory illness with symptoms that are similar to the flu. Symptoms are typically mild to moderate, but there have been cases of severe illness and death due to the virus.   The following symptoms may appear 2-14 days after exposure: . Fever . Cough . Shortness of breath or difficulty breathing . Chills . Repeated shaking with chills . Muscle pain . Headache . Sore throat . New loss of taste or smell . Fatigue . Congestion or runny nose . Nausea or vomiting . Diarrhea  Go to the nearest hospital ED for assessment if fever/cough/breathlessness are severe or illness seems like a threat to life.  It is vitally important that if you feel that you have an infection such as this virus or any other virus that you stay home and away from places where you may spread it to others.  You should avoid contact with people age 65 and older.   You can use medication such as A prescription cough medication called Tessalon Perles 100 mg. You may take 1-2 capsules every 8 hours as    needed for cough.  You may also take acetaminophen (Tylenol) as needed for fever.  Reduce your risk of any infection by using the same precautions used for avoiding the common cold or flu:  . Wash your hands often with soap and warm water for at least 20 seconds.  If soap and water are not readily available, use an alcohol-based hand sanitizer with at least 60% alcohol.  . If coughing or sneezing, cover your mouth and nose by coughing or sneezing into the elbow areas of your shirt or coat, into a tissue or into your sleeve (not your hands). . Avoid shaking hands with others and consider head nods or verbal greetings only. . Avoid touching your eyes, nose, or mouth with unwashed hands.  . Avoid close contact with people who are sick. . Avoid places or events with large numbers of people in one location, like concerts or sporting events. . Carefully consider travel plans you have or are making. . If you are planning any travel outside or inside the [place], visit the CDC's Travelers' Health webpage for the latest health notices. . If you have some symptoms but not all symptoms, continue to monitor at home and seek medical attention if your symptoms worsen. . If you are having a medical emergency, call 911.  HOME CARE . Only take medications as instructed by your medical team. . Drink plenty of fluids and get plenty of rest. . A steam or ultrasonic humidifier can help if you have congestion.   GET HELP RIGHT AWAY IF YOU HAVE EMERGENCY WARNING SIGNS** FOR COVID-19. If you or someone is showing any of these signs seek emergency medical care immediately. Call 911 or proceed to your closest emergency facility if: . You develop worsening high fever. . Trouble breathing . Bluish lips or face . Persistent pain or pressure in the chest . New confusion . Inability to wake or stay awake . You cough up blood. . Your symptoms become more severe  **This list is not all possible symptoms. Contact your  medical provider for any symptoms that are sever or concerning to you.  MAKE SURE YOU   Understand these instructions.  Will watch your condition.  Will get help right away if you are not doing well or get worse.  Your e-visit answers were reviewed by a board certified advanced clinical practitioner to complete your personal care plan.  Depending on the condition, your plan could have included both over the counter or prescription medications.  If there is a problem please reply once you have received a response from your provider.  Your safety is important to us.  If you have drug allergies check your prescription carefully.    You can use MyChart to ask questions about today's visit, request a non-urgent call back, or ask for a work or school excuse for 24 hours related to this e-Visit. If it has been greater than 24 hours you will need to follow up with your provider, or enter a new e-Visit to address those concerns. You will get an e-mail in the next two days asking about your experience.  I hope that your e-visit has been valuable and will speed your recovery. Thank you for using e-visits.  Approximately 5 minutes was spent documenting and reviewing patient's chart.    

## 2019-11-06 ENCOUNTER — Encounter (INDEPENDENT_AMBULATORY_CARE_PROVIDER_SITE_OTHER): Payer: Self-pay

## 2019-11-06 ENCOUNTER — Other Ambulatory Visit: Payer: Self-pay

## 2019-11-06 ENCOUNTER — Ambulatory Visit (INDEPENDENT_AMBULATORY_CARE_PROVIDER_SITE_OTHER): Payer: 59 | Admitting: Internal Medicine

## 2019-11-06 ENCOUNTER — Encounter: Payer: Self-pay | Admitting: Internal Medicine

## 2019-11-06 DIAGNOSIS — R0600 Dyspnea, unspecified: Secondary | ICD-10-CM | POA: Diagnosis not present

## 2019-11-06 DIAGNOSIS — R739 Hyperglycemia, unspecified: Secondary | ICD-10-CM

## 2019-11-06 DIAGNOSIS — U071 COVID-19: Secondary | ICD-10-CM | POA: Diagnosis not present

## 2019-11-06 DIAGNOSIS — R0609 Other forms of dyspnea: Secondary | ICD-10-CM

## 2019-11-06 MED ORDER — IBUPROFEN 800 MG PO TABS: 800.0000 mg | ORAL_TABLET | Freq: Three times a day (TID) | ORAL | 0 refills | Status: DC | PRN

## 2019-11-06 MED ORDER — PREDNISONE 10 MG (21) PO TBPK: ORAL_TABLET | Freq: Every day | ORAL | 0 refills | Status: DC

## 2019-11-06 MED ORDER — ALBUTEROL SULFATE HFA 108 (90 BASE) MCG/ACT IN AERS: 2.0000 | INHALATION_SPRAY | Freq: Four times a day (QID) | RESPIRATORY_TRACT | 1 refills | Status: DC | PRN

## 2019-11-06 NOTE — Patient Instructions (Signed)
Please take all new medication as prescribed - the ibuprofen, prednisone and inhaler  Please go to ED for any worsening o2 sats at home close to 90%  Please continue all other medications as before, and refills have been done if requested.  Please have the pharmacy call with any other refills you may need.  Please keep your appointments with your specialists as you may have planned

## 2019-11-06 NOTE — Progress Notes (Signed)
Patient ID: Julie Mcneil, female   DOB: [birth year], 38 y.o.   MRN: [id number]  Virtual Visit via Video Note  I connected with Julie Mcneil on 11/06/19 at  9:20 AM EST by a video enabled telemedicine application and verified that I am speaking with the correct person using two identifiers.  Location: Patient: at home Provider: at office   I discussed the limitations of evaluation and management by telemedicine and the availability of in person appointments. The patient expressed understanding and agreed to proceed.  History of Present Illness: Here to f/u, nNow day 9 she estimates of illness, found COVID + Dec 27; since then still has sinus pressure (not really pain) and feeling of congestion, but no post nasal gtt of nasal d/c, no fever but has HA, occas ST, occas dry cough, but more recently yesterday has developed some mild wheezing and unusual mild doe, with o2 sats at home in the 95-99%, not really getting worse. Pt denies chest pain, orthopnea, PND, increased LE swelling, palpitations, dizziness or syncope.   Pt denies polydipsia, polyuria   Past Medical History:  Diagnosis Date  . Anxiety    Panic attacks  . Cervical dysplasia   . GERD (gastroesophageal reflux disease)   . Obesity   . UTI (urinary tract infection)    recurrent  . Vaginal yeast infection    recurrent  . Vertigo    Past Surgical History:  Procedure Laterality Date  . CESAREAN SECTION  6962,[id number]   11-28-11  . COLPOSCOPY    . TUBAL LIGATION     2013 C-SECTION    reports that she has never smoked. She has never used smokeless tobacco. She reports current alcohol use. She reports that she does not use drugs. family history includes Cancer in her maternal grandmother; Diabetes in her maternal grandmother; Early death in her mother; Heart disease in her maternal grandmother; Hypertension in her paternal grandfather and paternal grandmother; Stroke in her paternal grandmother. Allergies  Allergen Reactions   . Sumatriptan Shortness Of Breath    Tight chest  . Levaquin [Levofloxacin In D5w] Nausea Only  . Nitrofurantoin Monohyd Macro Hives and Nausea And Vomiting   Current Outpatient Medications on File Prior to Visit  Medication Sig Dispense Refill  . ALPRAZolam (XANAX) 1 MG tablet 1/2 - 1 tab by mouth twice per day as needed 60 tablet 0  . citalopram (CELEXA) 20 MG tablet Take 1 tablet (20 mg total) by mouth daily. 90 tablet 3  . amoxicillin (AMOXIL) 500 MG capsule Take 1 capsule (500 mg total) by mouth 2 (two) times daily. (Patient not taking: Reported on 11/06/2019) 20 capsule 0  . benzonatate (TESSALON PERLES) 100 MG capsule Take 1 capsule (100 mg total) by mouth 3 (three) times daily as needed. (Patient not taking: Reported on 11/06/2019) 20 capsule 0  . sulfamethoxazole-trimethoprim (BACTRIM DS) 800-160 MG tablet Take 1 tablet by mouth 2 (two) times daily. (Patient not taking: Reported on 11/06/2019) 10 tablet 0  . triamcinolone cream (KENALOG) 0.1 % Apply 1 application topically 2 (two) times daily. (Patient not taking: Reported on 11/06/2019) 30 g 0   No current facility-administered medications on file prior to visit.   Observations/Objective: Alert, NAD, appropriate mood and affect, resps normal, cn 2-12 intact, moves all 4s, no visible rash or swelling Lab Results  Component Value Date   WBC 12.5 (H) 08/12/2018   HGB 11.9 (L) 08/12/2018   HCT 37.0 08/12/2018   PLT 390 08/12/2018   GLUCOSE  106 (H) 03/10/2018   CHOL 189 08/25/2013   TRIG 109 08/25/2013   HDL 54 08/25/2013   LDLCALC 113 (H) 08/25/2013   ALT 13 11/21/2016   AST 12 11/21/2016   NA 139 03/10/2018   K 4.0 03/10/2018   CL 107 03/10/2018   CREATININE 0.76 03/10/2018   BUN 13 03/10/2018   CO2 21 (L) 03/10/2018   TSH 3.230 12/23/2018   HGBA1C 5.5 04/09/2017   Assessment and Plan: See notes  Follow Up Instructions: See notes   I discussed the assessment and treatment plan with the patient. The patient was  provided an opportunity to ask questions and all were answered. The patient agreed with the plan and demonstrated an understanding of the instructions.   The patient was advised to call back or seek an in-person evaluation if the symptoms worsen or if the condition fails to improve as anticipated   Cathlean Cower, MD

## 2019-11-07 ENCOUNTER — Encounter: Payer: Self-pay | Admitting: Internal Medicine

## 2019-11-07 ENCOUNTER — Encounter (INDEPENDENT_AMBULATORY_CARE_PROVIDER_SITE_OTHER): Payer: Self-pay

## 2019-11-07 NOTE — Assessment & Plan Note (Signed)
stable overall by history and exam, recent data reviewed with pt, and pt to continue medical treatment as before,  to f/u any worsening symptoms or concerns  

## 2019-11-07 NOTE — Assessment & Plan Note (Signed)
Suspect may be early covid pna with  Bronchospasm, for albuterol prn and predpac asd, but also go to ED for any sign of dropping home o2 sats to near 90%

## 2019-11-07 NOTE — Assessment & Plan Note (Signed)
dw pt natural hx, ok for ibuprofen prn discomfort,  to f/u any worsening symptoms or concerns

## 2019-11-08 ENCOUNTER — Telehealth: Payer: Self-pay

## 2019-11-08 NOTE — Telephone Encounter (Signed)
Attempted to call pt and discuss yesterday c/o diarrhea. Unable to LM on VM "mailbox not set up." Will send message via MyChart.

## 2019-11-12 ENCOUNTER — Encounter: Payer: Self-pay | Admitting: Internal Medicine

## 2019-12-16 ENCOUNTER — Encounter (INDEPENDENT_AMBULATORY_CARE_PROVIDER_SITE_OTHER): Payer: Self-pay

## 2019-12-25 ENCOUNTER — Ambulatory Visit (INDEPENDENT_AMBULATORY_CARE_PROVIDER_SITE_OTHER): Payer: 59 | Admitting: Family Medicine

## 2020-01-07 ENCOUNTER — Ambulatory Visit (INDEPENDENT_AMBULATORY_CARE_PROVIDER_SITE_OTHER): Payer: 59 | Admitting: Family Medicine

## 2020-01-08 ENCOUNTER — Other Ambulatory Visit: Payer: Self-pay

## 2020-01-08 ENCOUNTER — Ambulatory Visit (INDEPENDENT_AMBULATORY_CARE_PROVIDER_SITE_OTHER): Payer: 59 | Admitting: Family Medicine

## 2020-01-08 ENCOUNTER — Encounter (INDEPENDENT_AMBULATORY_CARE_PROVIDER_SITE_OTHER): Payer: Self-pay | Admitting: Family Medicine

## 2020-01-08 VITALS — BP 122/82 | HR 67 | Temp 98.2°F | Ht 64.0 in | Wt 273.0 lb

## 2020-01-08 DIAGNOSIS — Z1331 Encounter for screening for depression: Secondary | ICD-10-CM

## 2020-01-08 DIAGNOSIS — R5383 Other fatigue: Secondary | ICD-10-CM

## 2020-01-08 DIAGNOSIS — F419 Anxiety disorder, unspecified: Secondary | ICD-10-CM

## 2020-01-08 DIAGNOSIS — Z9189 Other specified personal risk factors, not elsewhere classified: Secondary | ICD-10-CM | POA: Diagnosis not present

## 2020-01-08 DIAGNOSIS — E66813 Obesity, class 3: Secondary | ICD-10-CM

## 2020-01-08 DIAGNOSIS — R7303 Prediabetes: Secondary | ICD-10-CM | POA: Diagnosis not present

## 2020-01-08 DIAGNOSIS — R0683 Snoring: Secondary | ICD-10-CM

## 2020-01-08 DIAGNOSIS — R0602 Shortness of breath: Secondary | ICD-10-CM

## 2020-01-08 DIAGNOSIS — D649 Anemia, unspecified: Secondary | ICD-10-CM | POA: Diagnosis not present

## 2020-01-08 DIAGNOSIS — Z6841 Body Mass Index (BMI) 40.0 and over, adult: Secondary | ICD-10-CM

## 2020-01-08 DIAGNOSIS — Z0289 Encounter for other administrative examinations: Secondary | ICD-10-CM

## 2020-01-08 NOTE — Progress Notes (Signed)
Dear Julie Rowan, NP,   Thank you for referring Julie Mcneil to our clinic. The following note includes my evaluation and treatment recommendations.  Chief Complaint:   OBESITY Julie Mcneil (MR# [id number]) is a 39 y.o. female who presents for evaluation and treatment of obesity and related comorbidities. Current BMI is Body mass index is 46.86 kg/m. Julie Mcneil has been struggling with her weight for many years and has been unsuccessful in either losing weight, maintaining weight loss, or reaching her healthy weight goal.  Julie Mcneil is currently in the action stage of change and ready to dedicate time achieving and maintaining a healthier weight. Julie Mcneil is interested in becoming our patient and working on intensive lifestyle modifications including (but not limited to) diet and exercise for weight loss.  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  She is married with 3 daughters, ages 7-14.  She craves "starchy, flavorful foods".  She drinks 1-2 cans of Coke Zero daily.  She provided the following food recall:  Breakfast:  Scrambled eggs (3) or protein drink (Quest with almond milk) and coffee with 1/4 Fairlife creamer. Lunch:  Quest protein chips and taco salad. Dinner:  Salmon (5 ounces), Asian salad.  Julie Mcneil's habits were reviewed today and are as follows: Her family eats meals together, she thinks her family will eat healthier with her, she struggles with family and or coworkers weight loss sabotage, her desired weight loss is 138 pounds, she has been heavy most of her life, she started gaining weight in her 9s, her heaviest weight ever was 284 pounds, she craves starchy, flavorful foods, she snacks sometimes in the evenings, she skips breakfast frequently, she is frequently drinking liquids with calories, she frequently eats larger portions than normal and she struggles with emotional eating.  Depression Screen Julie Mcneil (modified PHQ-9)  score was 9.  Depression screen PHQ 2/9 01/08/2020  Decreased Interest 1  Down, Depressed, Hopeless 1  PHQ - 2 Score 2  Altered sleeping 1  Tired, decreased energy 2  Change in appetite 1  Feeling bad or failure about yourself  2  Trouble concentrating 1  Moving slowly or fidgety/restless 0  Suicidal thoughts 0  PHQ-9 Score 9  Difficult doing work/chores Not difficult at all   Subjective:   1. Other fatigue Julie Mcneil denies daytime somnolence and denies waking up still tired. Patent has a history of symptoms of snoring. Julie Mcneil generally gets 8-10 hours of sleep per night, and states that she has generally restful sleep. Snoring is present. Apneic episodes are not present. Epworth Sleepiness Score is 4.  2. SOB (shortness of breath) on exertion Julie Mcneil notes increasing shortness of breath with exercising and seems to be worsening over time with weight gain. She notes getting out of breath sooner with activity than she used to. This has not gotten worse recently. Julie Mcneil denies shortness of breath at rest or orthopnea.  3. Anemia, unspecified type Julie Mcneil is not a vegetarian.  She does not have a history of weight loss surgery.   CBC Latest Ref Rng & Units 08/12/2018 03/10/2018 11/08/2017  WBC 4.0 - 10.5 K/uL 12.5(H) 10.9(H) 11.8(H)  Hemoglobin 12.0 - 15.0 g/dL 11.9(L) 12.9 11.7(L)  Hematocrit 36.0 - 46.0 % 37.0 40.1 37.6  Platelets 150 - 400 K/uL 390 289 393   Lab Results  Component Value Date   IRON 33 (L) 09/20/2015   FERRITIN 24.3 09/20/2015   Lab Results  Component Value Date   VITAMINB12  394 09/20/2015   4. Prediabetes Julie Mcneil has a diagnosis of prediabetes based on her elevated HgA1c and was informed this puts her at greater risk of developing diabetes. She continues to work on diet and exercise to decrease her risk of diabetes. She denies nausea or hypoglycemia.  Lab Results  Component Value Date   HGBA1C 5.5 04/09/2017   5. Snores Situation Chance of  Dozing or Sleeping  Sitting and reading 1 = slight chance of dozing or sleeping  Watching television 1 = slight chance of dozing or sleeping  Sitting inactive in a public place (theater or meeting) 0 = would never doze or sleep  Lying down in the afternoon when circumstances permit 1 = slight chance of dozing or sleeping  Sitting as a passenger in a car for an hour 1 = slight chance of dozing or sleeping  Sitting and talking to someone 0 = would never doze or sleep  Sitting quietly after lunch without alcohol 0 = would never doze or sleep  In a car, while stopped for a few minutes in traffic 0 = would never doze or sleep  TOTAL 4   6. Anxiety, with emotional eating Julie Mcneil has been taking Celexa 10 mg daily since 02/24/2019.  She says she feels much better since starting it.  PHQ-9 is 9 today.  7. Depression screening Julie Mcneil was screened for depression as part of her new patient workup today.  8. At risk for diabetes mellitus Julie Mcneil is at higher than average risk for developing diabetes due to her obesity.   Assessment/Plan:   1. Other fatigue Julie Mcneil does not feel that her weight is causing her energy to be lower than it should be. Fatigue may be related to obesity, depression or many other causes. Labs will be ordered, and in the meanwhile, Julie Mcneil will focus on self care including making healthy food choices, increasing physical activity and focusing on stress reduction.  Orders - EKG 12-Lead - Lipid panel - VITAMIN D 25 Hydroxy (Vit-D Deficiency, Fractures) - T4, free - TSH - T3  2. SOB (shortness of breath) on exertion Julie Mcneil does not feel that she gets out of breath more easily that she used to when she exercises. Julie Mcneil's shortness of breath appears to be obesity related and exercise induced. She has agreed to work on weight loss and gradually increase exercise to treat her exercise induced shortness of breath. Will continue to monitor closely.  Orders -  CBC with Differential/Platelet  3. Anemia, unspecified type Orders and follow up as documented in patient record.  Counseling . Iron is essential for our bodies to make red blood cells.  Reasons that someone may be deficient include: an iron-deficient diet (more likely in those following vegan or vegetarian diets), women with heavy menses, patients with GI disorders or poor absorption, patients that have had bariatric surgery, frequent blood donors, patients with cancer, and patients with heart disease.   Gaspar Cola foods include dark leafy greens, red and white meats, eggs, seafood, and beans.   . Certain foods and drinks prevent your body from absorbing iron properly. Avoid eating these foods in the same meal as iron-rich foods or with iron supplements. These foods include: coffee, black tea, and red wine; milk, dairy products, and foods that are high in calcium; beans and soybeans; whole grains.  . Constipation can be a side effect of iron supplementation. Increased water and fiber intake are helpful. Water goal: > 2 liters/day. Fiber goal: > 25 grams/day.  Orders -  Comprehensive metabolic panel - Anemia panel  4. Prediabetes Eraina will continue to work on weight loss, exercise, and decreasing simple carbohydrates to help decrease the risk of diabetes.   Orders - Hemoglobin A1c - Insulin, random  5. Snores Will continue to monitor.  6. Anxiety, with emotional eating Behavior modification techniques were discussed today to help Yeny deal with her anxiety.  Orders and follow up as documented in patient record.    7. Depression screening Roderick had a positive depression screening. Depression is commonly associated with obesity and often results in emotional eating behaviors. We will monitor this closely and work on CBT to help improve the non-hunger eating patterns. Referral to Psychology may be required if no improvement is seen as she continues in our clinic.  8. At risk  for diabetes mellitus Amonie was given approximately 15 minutes of diabetes education and counseling today. We discussed intensive lifestyle modifications today with an emphasis on weight loss as well as increasing exercise and decreasing simple carbohydrates in her diet. We also reviewed medication options with an emphasis on risk versus benefit of those discussed.   Repetitive spaced learning was employed today to elicit superior memory formation and behavioral change.  9. Class 3 severe obesity with serious comorbidity and body mass index (BMI) of 45.0 to 49.9 in adult, unspecified obesity type [name]) Katye is currently in the action stage of change and her goal is to continue with weight loss efforts. I recommend Tanis begin the structured treatment plan as follows:  She has agreed to the Category 3 Plan.  Exercise goals: As is.   Behavioral modification strategies: increasing lean protein intake, decreasing simple carbohydrates, increasing vegetables, increasing water intake and decreasing liquid calories.  She was informed of the importance of frequent follow-up visits to maximize her success with intensive lifestyle modifications for her multiple health conditions. She was informed we would discuss her lab results at her next visit unless there is a critical issue that needs to be addressed sooner. Altha agreed to keep her next visit at the agreed upon time to discuss these results.  Objective:   Blood pressure 122/82, pulse 67, temperature 98.2 F (36.8 C), temperature source Oral, height 5\' 4"  (1.626 m), weight 273 lb (123.8 kg), last menstrual period 01/08/2020, SpO2 97 %. Body mass index is 46.86 kg/m.  EKG: Normal sinus rhythm, rate 71 bpm.  Indirect Calorimeter completed today shows a VO2 of 274 and a REE of 1908.  Her calculated basal metabolic rate is 03/09/2020 thus her basal metabolic rate is worse than expected.  General: Cooperative, alert, well developed, in no  acute distress. HEENT: Conjunctivae and lids unremarkable. Cardiovascular: Regular rhythm.  Lungs: Normal work of breathing. Neurologic: No focal deficits.   Lab Results  Component Value Date   CREATININE 0.76 03/10/2018   BUN 13 03/10/2018   NA 139 03/10/2018   K 4.0 03/10/2018   CL 107 03/10/2018   CO2 21 (L) 03/10/2018   Lab Results  Component Value Date   ALT 13 11/21/2016   AST 12 11/21/2016   ALKPHOS 87 11/21/2016   BILITOT 0.7 11/21/2016   Lab Results  Component Value Date   HGBA1C 5.5 04/09/2017   HGBA1C 5.6 09/20/2015   HGBA1C 5.8 (H) 07/28/2015   HGBA1C 5.9 11/16/2014   No results found for: INSULIN Lab Results  Component Value Date   TSH 3.230 12/23/2018   Lab Results  Component Value Date   CHOL 189 08/25/2013   HDL 54  08/25/2013   LDLCALC 113 (H) 08/25/2013   TRIG 109 08/25/2013   CHOLHDL 3.5 08/25/2013   Lab Results  Component Value Date   WBC 12.5 (H) 08/12/2018   HGB 11.9 (L) 08/12/2018   HCT 37.0 08/12/2018   MCV 86.4 08/12/2018   PLT 390 08/12/2018   Lab Results  Component Value Date   IRON 33 (L) 09/20/2015   FERRITIN 24.3 09/20/2015   Attestation Statements:   This is the patient's first visit at [facility]. The patient's NEW PATIENT PACKET was reviewed at length. Included in the packet: current and past health history, medications, allergies, ROS, gynecologic history (women only), surgical history, family history, social history, weight history, weight loss surgery history (for those that have had weight loss surgery), nutritional evaluation, Mcneil and food questionnaire, PHQ9, Epworth questionnaire, sleep habits questionnaire, patient life and health improvement goals questionnaire. These will all be scanned into the patient's chart under media.   During the visit, I independently reviewed the patient's EKG, bioimpedance scale results, and indirect calorimeter results. I used this information to tailor a meal plan for  the patient that will help her to lose weight and will improve her obesity-related conditions going forward. I performed a medically necessary appropriate examination and/or evaluation. I discussed the assessment and treatment plan with the patient. The patient was provided an opportunity to ask questions and all were answered. The patient agreed with the plan and demonstrated an understanding of the instructions. Labs were ordered at this visit and will be reviewed at the next visit unless more critical results need to be addressed immediately. Clinical information was updated and documented in the EMR.   I, Water quality scientist, CMA, am acting as Location manager for [employer], DO.  I have reviewed the above documentation for accuracy and completeness, and I agree with the above. Briscoe Deutscher, DO

## 2020-01-09 LAB — CBC WITH DIFFERENTIAL/PLATELET
Basophils Absolute: 0.1 10*3/uL (ref 0.0–0.2)
Basos: 1 %
EOS (ABSOLUTE): 0.2 10*3/uL (ref 0.0–0.4)
Eos: 2 %
Hemoglobin: 11.7 g/dL (ref 11.1–15.9)
Immature Grans (Abs): 0 10*3/uL (ref 0.0–0.1)
Immature Granulocytes: 0 %
Lymphocytes Absolute: 2.3 10*3/uL (ref 0.7–3.1)
Lymphs: 24 %
MCH: 26.4 pg — ABNORMAL LOW (ref 26.6–33.0)
MCHC: 31 g/dL — ABNORMAL LOW (ref 31.5–35.7)
MCV: 85 fL (ref 79–97)
Monocytes Absolute: 0.5 10*3/uL (ref 0.1–0.9)
Monocytes: 5 %
Neutrophils Absolute: 6.5 10*3/uL (ref 1.4–7.0)
Neutrophils: 68 %
Platelets: 400 10*3/uL (ref 150–450)
RBC: 4.44 x10E6/uL (ref 3.77–5.28)
RDW: 14.5 % (ref 11.7–15.4)
WBC: 9.5 10*3/uL (ref 3.4–10.8)

## 2020-01-09 LAB — COMPREHENSIVE METABOLIC PANEL
ALT: 13 IU/L (ref 0–32)
AST: 15 IU/L (ref 0–40)
Albumin/Globulin Ratio: 1.8 (ref 1.2–2.2)
Albumin: 4.4 g/dL (ref 3.8–4.8)
Alkaline Phosphatase: 99 IU/L (ref 39–117)
BUN/Creatinine Ratio: 14 (ref 9–23)
BUN: 12 mg/dL (ref 6–20)
Bilirubin Total: 0.6 mg/dL (ref 0.0–1.2)
CO2: 24 mmol/L (ref 20–29)
Calcium: 9 mg/dL (ref 8.7–10.2)
Chloride: 104 mmol/L (ref 96–106)
Creatinine, Ser: 0.84 mg/dL (ref 0.57–1.00)
GFR calc Af Amer: 102 mL/min/{1.73_m2} (ref >59)
GFR calc non Af Amer: 88 mL/min/{1.73_m2} (ref >59)
Globulin, Total: 2.5 g/dL (ref 1.5–4.5)
Glucose: 92 mg/dL (ref 65–99)
Potassium: 4.8 mmol/L (ref 3.5–5.2)
Sodium: 142 mmol/L (ref 134–144)
Total Protein: 6.9 g/dL (ref 6.0–8.5)

## 2020-01-09 LAB — ANEMIA PANEL
Ferritin: 26 ng/mL (ref 15–150)
Folate, Hemolysate: 259 ng/mL
Folate, RBC: 685 ng/mL (ref >498)
Hematocrit: 37.8 % (ref 34.0–46.6)
Iron Saturation: 11 % — ABNORMAL LOW (ref 15–55)
Iron: 37 ug/dL (ref 27–159)
Retic Ct Pct: 1.4 % (ref 0.6–2.6)
Total Iron Binding Capacity: 327 ug/dL (ref 250–450)
UIBC: 290 ug/dL (ref 131–425)
Vitamin B-12: 533 pg/mL (ref 232–1245)

## 2020-01-09 LAB — VITAMIN D 25 HYDROXY (VIT D DEFICIENCY, FRACTURES): Vit D, 25-Hydroxy: 25 ng/mL — ABNORMAL LOW (ref 30.0–100.0)

## 2020-01-09 LAB — TSH: TSH: 1.99 u[IU]/mL (ref 0.450–4.500)

## 2020-01-09 LAB — LIPID PANEL
Chol/HDL Ratio: 3.7 ratio (ref 0.0–4.4)
Cholesterol, Total: 190 mg/dL (ref 100–199)
HDL: 52 mg/dL (ref >39)
LDL Chol Calc (NIH): 115 mg/dL — ABNORMAL HIGH (ref 0–99)
Triglycerides: 132 mg/dL (ref 0–149)
VLDL Cholesterol Cal: 23 mg/dL (ref 5–40)

## 2020-01-09 LAB — INSULIN, RANDOM: INSULIN: 18 u[IU]/mL (ref 2.6–24.9)

## 2020-01-09 LAB — HEMOGLOBIN A1C
Est. average glucose Bld gHb Est-mCnc: 117 mg/dL
Hgb A1c MFr Bld: 5.7 % — ABNORMAL HIGH (ref 4.8–5.6)

## 2020-01-09 LAB — T3: T3, Total: 122 ng/dL (ref 71–180)

## 2020-01-09 LAB — T4, FREE: Free T4: 0.98 ng/dL (ref 0.82–1.77)

## 2020-01-22 ENCOUNTER — Encounter (INDEPENDENT_AMBULATORY_CARE_PROVIDER_SITE_OTHER): Payer: Self-pay | Admitting: Family Medicine

## 2020-01-22 ENCOUNTER — Ambulatory Visit (INDEPENDENT_AMBULATORY_CARE_PROVIDER_SITE_OTHER): Payer: 59 | Admitting: Family Medicine

## 2020-01-22 ENCOUNTER — Other Ambulatory Visit: Payer: Self-pay

## 2020-01-22 VITALS — BP 118/67 | HR 77 | Temp 98.1°F | Ht 64.0 in | Wt 269.0 lb

## 2020-01-22 DIAGNOSIS — E7849 Other hyperlipidemia: Secondary | ICD-10-CM

## 2020-01-22 DIAGNOSIS — R7303 Prediabetes: Secondary | ICD-10-CM | POA: Diagnosis not present

## 2020-01-22 DIAGNOSIS — Z6841 Body Mass Index (BMI) 40.0 and over, adult: Secondary | ICD-10-CM

## 2020-01-22 DIAGNOSIS — E559 Vitamin D deficiency, unspecified: Secondary | ICD-10-CM | POA: Diagnosis not present

## 2020-01-22 DIAGNOSIS — Z9189 Other specified personal risk factors, not elsewhere classified: Secondary | ICD-10-CM

## 2020-01-22 DIAGNOSIS — K5909 Other constipation: Secondary | ICD-10-CM | POA: Diagnosis not present

## 2020-01-22 MED ORDER — METFORMIN HCL 500 MG PO TABS: 500.0000 mg | ORAL_TABLET | Freq: Every day | ORAL | 0 refills | Status: DC

## 2020-01-22 MED ORDER — VITAMIN D (ERGOCALCIFEROL) 1.25 MG (50000 UNIT) PO CAPS: 50000.0000 [IU] | ORAL_CAPSULE | ORAL | 0 refills | Status: DC

## 2020-01-26 NOTE — Progress Notes (Signed)
Chief Complaint:   OBESITY Julie Mcneil is here to discuss her progress with her obesity treatment plan along with follow-up of her obesity related diagnoses. Julie Mcneil is on the Category 3 Plan and states she is following her eating plan approximately 90% of the time. Julie Mcneil states she is walking for 45 minutes 5 times per week.  Today's visit was #: 2 Starting weight: 273 lbs Starting date: 01/08/2020 Today's weight: 269 lbs Today's date: 01/22/2020 Total lbs lost to date: 4 lbs Total lbs lost since last in-office visit: 4 lbs  Interim History: Julie Mcneil says it feels like she is eating more food.  She is not eating snacks.    She provided the following food recall: Breakfast:  3 eggs. Lunch:  Sandwich or [employer]. Dinner:  Meat/vegetables.  Subjective:   1. Prediabetes Julie Mcneil has a diagnosis of prediabetes based on her elevated HgA1c and was informed this puts her at greater risk of developing diabetes. She continues to work on diet and exercise to decrease her risk of diabetes. She denies nausea or hypoglycemia.  Lab Results  Component Value Date   HGBA1C 5.7 (H) 01/08/2020   Lab Results  Component Value Date   INSULIN 18.0 01/08/2020   2. Vitamin D deficiency Julie Mcneil's Vitamin D level was 25.0 on 01/08/2020. She is not currently taking vit D. She denies nausea, vomiting or muscle weakness.  3. Other hyperlipidemia Julie Mcneil has hyperlipidemia and has been trying to improve her cholesterol levels with intensive lifestyle modification including a low saturated fat diet, exercise and weight loss. She denies any chest pain, claudication or myalgias.  Lab Results  Component Value Date   ALT 13 01/08/2020   AST 15 01/08/2020   ALKPHOS 99 01/08/2020   BILITOT 0.6 01/08/2020   Lab Results  Component Value Date   CHOL 190 01/08/2020   HDL 52 01/08/2020   LDLCALC 115 (H) 01/08/2020   TRIG 132 01/08/2020   CHOLHDL 3.7 01/08/2020   4. Other  constipation Julie Mcneil notes constipation.   Assessment/Plan:   1. Prediabetes Julie Mcneil will continue to work on weight loss, exercise, and decreasing simple carbohydrates to help decrease the risk of diabetes.   Orders - metFORMIN (GLUCOPHAGE) 500 MG tablet; Take 1 tablet (500 mg total) by mouth daily.  Dispense: 30 tablet; Refill: 0  2. Vitamin D deficiency Low Vitamin D level contributes to fatigue and are associated with obesity, breast, and colon cancer. She agrees to start to take prescription Vitamin D @50 ,000 IU every week and will follow-up for routine testing of Vitamin D, at least 2-3 times per year to avoid over-replacement.  Orders - Vitamin D, Ergocalciferol, (DRISDOL) 1.25 MG (50000 UNIT) CAPS capsule; Take 1 capsule (50,000 Units total) by mouth every 7 (seven) days.  Dispense: 4 capsule; Refill: 0  3. Other hyperlipidemia Cardiovascular risk and specific lipid/LDL goals reviewed.  We discussed several lifestyle modifications today and Julie Mcneil will continue to work on diet, exercise and weight loss efforts. Orders and follow up as documented in patient record.   Counseling Intensive lifestyle modifications are the first line treatment for this issue. . Dietary changes: Increase soluble fiber. Decrease simple carbohydrates. . Exercise changes: Moderate to vigorous-intensity aerobic activity 150 minutes per week if tolerated. . Lipid-lowering medications: see documented in medical record.  4. Other constipation Julie Mcneil was informed that a decrease in bowel movement frequency is normal while losing weight, but stools should not be hard or painful. Orders and follow up as documented in  patient record.   Counseling Getting to Good Bowel Health: Your goal is to have one soft bowel movement each day. Drink at least 8 glasses of water each day. Eat plenty of fiber (goal is over 25 grams each day). It is best to get most of your fiber from dietary sources which includes  leafy green vegetables, fresh fruit, and whole grains. You may need to add fiber with the help of OTC fiber supplements. These include Metamucil, Citrucel, and Flaxseed. If you are still having trouble, try adding Miralax or Magnesium Citrate. If all of these changes do not work, Dietitian.  5. At risk for diabetes mellitus Julie Mcneil was given approximately 15 minutes of diabetes education and counseling today. We discussed intensive lifestyle modifications today with an emphasis on weight loss as well as increasing exercise and decreasing simple carbohydrates in her diet. We also reviewed medication options with an emphasis on risk versus benefit of those discussed.   Repetitive spaced learning was employed today to elicit superior memory formation and behavioral change.  6. Class 3 severe obesity with serious comorbidity and body mass index (BMI) of 45.0 to 49.9 in adult, unspecified obesity type [facility]) Julie Mcneil is currently in the action stage of change. As such, her goal is to continue with weight loss efforts. She has agreed to the Category 3 Plan.   Exercise goals: As is.  Behavioral modification strategies: increasing lean protein intake, decreasing simple carbohydrates, increasing vegetables and increasing water intake.  Julie Mcneil has agreed to follow-up with our clinic in 2 weeks. She was informed of the importance of frequent follow-up visits to maximize her success with intensive lifestyle modifications for her multiple health conditions.   Objective:   Blood pressure 118/67, pulse 77, temperature 98.1 F (36.7 C), temperature source Oral, height 5\' 4"  (1.626 m), weight 269 lb (122 kg), last menstrual period 01/08/2020, SpO2 98 %. Body mass index is 46.17 kg/m.  General: Cooperative, alert, well developed, in no acute distress. HEENT: Conjunctivae and lids unremarkable. Cardiovascular: Regular rhythm.  Lungs: Normal work of breathing. Neurologic: No focal deficits.    Lab Results  Component Value Date   CREATININE 0.84 01/08/2020   BUN 12 01/08/2020   NA 142 01/08/2020   K 4.8 01/08/2020   CL 104 01/08/2020   CO2 24 01/08/2020   Lab Results  Component Value Date   ALT 13 01/08/2020   AST 15 01/08/2020   ALKPHOS 99 01/08/2020   BILITOT 0.6 01/08/2020   Lab Results  Component Value Date   HGBA1C 5.7 (H) 01/08/2020   HGBA1C 5.5 04/09/2017   HGBA1C 5.6 09/20/2015   HGBA1C 5.8 (H) 07/28/2015   HGBA1C 5.9 11/16/2014   Lab Results  Component Value Date   INSULIN 18.0 01/08/2020   Lab Results  Component Value Date   TSH 1.990 01/08/2020   Lab Results  Component Value Date   CHOL 190 01/08/2020   HDL 52 01/08/2020   LDLCALC 115 (H) 01/08/2020   TRIG 132 01/08/2020   CHOLHDL 3.7 01/08/2020   Lab Results  Component Value Date   WBC 9.5 01/08/2020   HGB 11.7 01/08/2020   HCT 37.8 01/08/2020   MCV 85 01/08/2020   PLT 400 01/08/2020   Lab Results  Component Value Date   IRON 37 01/08/2020   TIBC 327 01/08/2020   FERRITIN 26 01/08/2020   Attestation Statements:   Reviewed by clinician on day of visit: allergies, medications, problem list, medical history, surgical history, family history,  social history, and previous encounter notes.  I, Water quality scientist, CMA, am acting as Location manager for [employer], DO.  I have reviewed the above documentation for accuracy and completeness, and I agree with the above. Briscoe Deutscher, DO

## 2020-02-03 ENCOUNTER — Telehealth: Payer: 59 | Admitting: Physician Assistant

## 2020-02-03 DIAGNOSIS — N941 Unspecified dyspareunia: Secondary | ICD-10-CM

## 2020-02-03 DIAGNOSIS — N898 Other specified noninflammatory disorders of vagina: Secondary | ICD-10-CM

## 2020-02-03 NOTE — Progress Notes (Signed)
Based on what you shared with me, I feel your condition warrants further evaluation and I recommend that you be seen for a face to face office visit by either your primary care provider, at urgent care, or in the emergency department. Because you are having discharge, pain with intercourse, and have recently had a new sexual partner, I would recommend that you be seen in person so you can have a pelvic exam and be screened for sexually transmitted infections and other vaginal infections such as bacterial vaginosis. You will likely also need a urinalysis to check for a urinary tract infection.    NOTE: If you entered your credit card information for this eVisit, you will not be charged. You may see a "hold" on your card for the $35 but that hold will drop off and you will not have a charge processed.   If you are having a true medical emergency please call 911.      For an urgent face to face visit, [facility] has five urgent care centers for your convenience:      NEW:  [facility] at [facility] Directions [phone] [address] [place], [place] [address] . 10 am - 6pm Monday - Friday    [facility]) Get Driving Directions [phone] [address] [place], [place] [address] . 10 am to 8 pm Monday-Friday . 12 pm to 8 pm [facility] Urgent Care at [facility] Get Driving Directions [phone] [address] [place], [place] [address] . 8 am to 8 pm Monday-Friday . 9 am to 6 pm Saturday . 11 am to 6 pm Sunday     [facility] at [facility] Get Driving Directions  [phone] [address] [place], [place] [address] . 8 am to 8 pm Monday-Friday . 8 am to 4 pm [facility] Urgent Care at [facility] Directions [phone] [address] [place], [place] [address] . 12 pm to 6 pm  Monday-Friday      Your e-visit answers were reviewed by a board certified advanced clinical practitioner to complete your personal care plan.  Thank you for using e-Visits.   Approximately 5 minutes was spent documenting and reviewing patient's chart.

## 2020-02-05 ENCOUNTER — Encounter (INDEPENDENT_AMBULATORY_CARE_PROVIDER_SITE_OTHER): Payer: Self-pay | Admitting: Family Medicine

## 2020-02-05 ENCOUNTER — Other Ambulatory Visit: Payer: Self-pay

## 2020-02-05 ENCOUNTER — Ambulatory Visit (INDEPENDENT_AMBULATORY_CARE_PROVIDER_SITE_OTHER): Payer: 59 | Admitting: Family Medicine

## 2020-02-05 VITALS — BP 124/86 | HR 69 | Temp 98.0°F | Ht 64.0 in | Wt 270.0 lb

## 2020-02-05 DIAGNOSIS — Z6841 Body Mass Index (BMI) 40.0 and over, adult: Secondary | ICD-10-CM | POA: Diagnosis not present

## 2020-02-05 DIAGNOSIS — R7303 Prediabetes: Secondary | ICD-10-CM | POA: Diagnosis not present

## 2020-02-09 ENCOUNTER — Encounter (INDEPENDENT_AMBULATORY_CARE_PROVIDER_SITE_OTHER): Payer: Self-pay | Admitting: Family Medicine

## 2020-02-09 DIAGNOSIS — R7303 Prediabetes: Secondary | ICD-10-CM | POA: Insufficient documentation

## 2020-02-09 NOTE — Progress Notes (Signed)
Chief Complaint:   OBESITY Julie Mcneil is here to discuss her progress with her obesity treatment plan along with follow-up of her obesity related diagnoses. Julie Mcneil is on the Category 3 Plan and states she is following her eating plan approximately 50% of the time. Julie Mcneil states she is doing 0 minutes 0 times per week.  Today's visit was #: 3 Starting weight: 273 lbs Starting date: 01/08/2020 Today's weight: 270 lbs Today's date: 02/05/2020 Total lbs lost to date: 3 Total lbs lost since last in-office visit: 0  Interim History: Julie Mcneil recently moved into a new house, and she has eaten out a bit due to not having groceries in the new house yet. She does like the food on the plan.  Subjective:   1. Pre-diabetes Julie Mcneil's last A1c was 5.7. She started metformin at her last visit. She notes it has improved constipation, and she notes mild nausea. She denies polyphagia.  Assessment/Plan:   1. Pre-diabetes Julie Mcneil will continue metformin, and will continue to work on weight loss, exercise, and decreasing simple carbohydrates to help decrease the risk of diabetes.   2. Class 3 severe obesity with serious comorbidity and body mass index (BMI) of 45.0 to 49.9 in adult, unspecified obesity type [facility]) Julie Mcneil is currently in the action stage of change. As such, her goal is to continue with weight loss efforts. She has agreed to the Category 3 Plan.   Handouts given today: Recipes, Protein Equivalents, and Lunch and Breakfast options.  Exercise goals: No exercise has been prescribed at this time.  Behavioral modification strategies: decreasing eating out and meal planning and cooking strategies.  Julie Mcneil has agreed to follow-up with our clinic in 2 to 3 weeks. She was informed of the importance of frequent follow-up visits to maximize her success with intensive lifestyle modifications for her multiple health conditions.   Objective:   Blood pressure 124/86, pulse 69,  temperature 98 F (36.7 C), temperature source Oral, height 5\' 4"  (1.626 m), weight 270 lb (122.5 kg), last menstrual period 01/08/2020, SpO2 99 %. Body mass index is 46.35 kg/m.  General: Cooperative, alert, well developed, in no acute distress. HEENT: Conjunctivae and lids unremarkable. Cardiovascular: Regular rhythm.  Lungs: Normal work of breathing. Neurologic: No focal deficits.   Lab Results  Component Value Date   CREATININE 0.84 01/08/2020   BUN 12 01/08/2020   NA 142 01/08/2020   K 4.8 01/08/2020   CL 104 01/08/2020   CO2 24 01/08/2020   Lab Results  Component Value Date   ALT 13 01/08/2020   AST 15 01/08/2020   ALKPHOS 99 01/08/2020   BILITOT 0.6 01/08/2020   Lab Results  Component Value Date   HGBA1C 5.7 (H) 01/08/2020   HGBA1C 5.5 04/09/2017   HGBA1C 5.6 09/20/2015   HGBA1C 5.8 (H) 07/28/2015   HGBA1C 5.9 11/16/2014   Lab Results  Component Value Date   INSULIN 18.0 01/08/2020   Lab Results  Component Value Date   TSH 1.990 01/08/2020   Lab Results  Component Value Date   CHOL 190 01/08/2020   HDL 52 01/08/2020   LDLCALC 115 (H) 01/08/2020   TRIG 132 01/08/2020   CHOLHDL 3.7 01/08/2020   Lab Results  Component Value Date   WBC 9.5 01/08/2020   HGB 11.7 01/08/2020   HCT 37.8 01/08/2020   MCV 85 01/08/2020   PLT 400 01/08/2020   Lab Results  Component Value Date   IRON 37 01/08/2020   TIBC 327 01/08/2020  FERRITIN 26 01/08/2020   Attestation Statements:   Reviewed by clinician on day of visit: allergies, medications, problem list, medical history, surgical history, family history, social history, and previous encounter notes.   Trude Mcburney, am acting as Energy manager for [place], FNP-C.  I have reviewed the above documentation for accuracy and completeness, and I agree with the above. -  Jesse Sans, FNP

## 2020-02-20 ENCOUNTER — Other Ambulatory Visit (INDEPENDENT_AMBULATORY_CARE_PROVIDER_SITE_OTHER): Payer: Self-pay | Admitting: Family Medicine

## 2020-02-20 DIAGNOSIS — E559 Vitamin D deficiency, unspecified: Secondary | ICD-10-CM

## 2020-02-21 ENCOUNTER — Other Ambulatory Visit (INDEPENDENT_AMBULATORY_CARE_PROVIDER_SITE_OTHER): Payer: Self-pay | Admitting: Family Medicine

## 2020-02-21 DIAGNOSIS — R7303 Prediabetes: Secondary | ICD-10-CM

## 2020-02-27 ENCOUNTER — Telehealth: Payer: 59 | Admitting: Physician Assistant

## 2020-02-27 ENCOUNTER — Encounter: Payer: Self-pay | Admitting: Physician Assistant

## 2020-02-27 DIAGNOSIS — J019 Acute sinusitis, unspecified: Secondary | ICD-10-CM

## 2020-02-27 MED ORDER — AMOXICILLIN-POT CLAVULANATE 875-125 MG PO TABS: 1.0000 | ORAL_TABLET | Freq: Two times a day (BID) | ORAL | 0 refills | Status: DC

## 2020-02-27 NOTE — Progress Notes (Signed)
We are sorry that you are not feeling well.  Here is how we plan to help!  Based on what you have shared with me it looks like you have sinusitis.  Sinusitis is inflammation and infection in the sinus cavities of the head.  Based on your presentation I believe you most likely have Acute Bacterial Sinusitis.  This is an infection caused by bacteria and is treated with antibiotics. I have prescribed Augmentin 875mg/125mg one tablet twice daily with food, for 7 days. You may use an oral decongestant such as Mucinex D or if you have glaucoma or high blood pressure use plain Mucinex. Saline nasal spray help and can safely be used as often as needed for congestion.  If you develop worsening sinus pain, fever or notice severe headache and vision changes, or if symptoms are not better after completion of antibiotic, please schedule an appointment with a health care provider.    Sinus infections are not as easily transmitted as other respiratory infection, however we still recommend that you avoid close contact with loved ones, especially the very young and elderly.  Remember to wash your hands thoroughly throughout the day as this is the number one way to prevent the spread of infection!  Home Care:  Only take medications as instructed by your medical team.  Complete the entire course of an antibiotic.  Do not take these medications with alcohol.  A steam or ultrasonic humidifier can help congestion.  You can place a towel over your head and breathe in the steam from hot water coming from a faucet.  Avoid close contacts especially the very young and the elderly.  Cover your mouth when you cough or sneeze.  Always remember to wash your hands.  Get Help Right Away If:  You develop worsening fever or sinus pain.  You develop a severe head ache or visual changes.  Your symptoms persist after you have completed your treatment plan.  Make sure you  Understand these instructions.  Will watch your  condition.  Will get help right away if you are not doing well or get worse.  Your e-visit answers were reviewed by a board certified advanced clinical practitioner to complete your personal care plan.  Depending on the condition, your plan could have included both over the counter or prescription medications.  If there is a problem please reply  once you have received a response from your provider.  Your safety is important to us.  If you have drug allergies check your prescription carefully.    You can use MyChart to ask questions about today's visit, request a non-urgent call back, or ask for a work or school excuse for 24 hours related to this e-Visit. If it has been greater than 24 hours you will need to follow up with your provider, or enter a new e-Visit to address those concerns.  You will get an e-mail in the next two days asking about your experience.  I hope that your e-visit has been valuable and will speed your recovery. Thank you for using e-visits.   I spent 5-10 minutes on review and completion of this note- Sahar Osman PAC  

## 2020-03-01 ENCOUNTER — Encounter (INDEPENDENT_AMBULATORY_CARE_PROVIDER_SITE_OTHER): Payer: Self-pay | Admitting: Family Medicine

## 2020-03-01 ENCOUNTER — Other Ambulatory Visit: Payer: Self-pay

## 2020-03-01 ENCOUNTER — Ambulatory Visit (INDEPENDENT_AMBULATORY_CARE_PROVIDER_SITE_OTHER): Payer: 59 | Admitting: Family Medicine

## 2020-03-01 VITALS — BP 129/87 | HR 79 | Temp 98.5°F | Ht 64.0 in | Wt 274.0 lb

## 2020-03-01 DIAGNOSIS — Z9189 Other specified personal risk factors, not elsewhere classified: Secondary | ICD-10-CM

## 2020-03-01 DIAGNOSIS — Z6841 Body Mass Index (BMI) 40.0 and over, adult: Secondary | ICD-10-CM

## 2020-03-01 DIAGNOSIS — F3289 Other specified depressive episodes: Secondary | ICD-10-CM | POA: Diagnosis not present

## 2020-03-01 DIAGNOSIS — R7303 Prediabetes: Secondary | ICD-10-CM

## 2020-03-01 DIAGNOSIS — E559 Vitamin D deficiency, unspecified: Secondary | ICD-10-CM | POA: Diagnosis not present

## 2020-03-02 MED ORDER — VITAMIN D (ERGOCALCIFEROL) 1.25 MG (50000 UNIT) PO CAPS: 50000.0000 [IU] | ORAL_CAPSULE | ORAL | 0 refills | Status: DC

## 2020-03-02 MED ORDER — BUPROPION HCL ER (SR) 150 MG PO TB12: 150.0000 mg | ORAL_TABLET | Freq: Every day | ORAL | 0 refills | Status: DC

## 2020-03-02 NOTE — Progress Notes (Signed)
Chief Complaint:   OBESITY Julie Mcneil is here to discuss her progress with her obesity treatment plan along with follow-up of her obesity related diagnoses. Julie Mcneil is on the Category 3 Plan and states she is following her eating plan approximately 0% of the time. Julie Mcneil states she is walking for 45 minutes 4 times per week.  Today's visit was #: 4 Starting weight: 273 lbs Starting date: 01/08/2020 Today's weight: 274 lbs Today's date: 03/01/2020 Total lbs lost to date: 0 Total lbs lost since last in-office visit: 0  Interim History: Julie Mcneil says she has been stressed and busy.  She has moved into a new home. She admits to eating out too often.  She says she lacks motivation.  Subjective:   1. Prediabetes Julie Mcneil has a diagnosis of prediabetes based on her elevated HgA1c and was informed this puts her at greater risk of developing diabetes. She continues to work on diet and exercise to decrease her risk of diabetes. She denies nausea or hypoglycemia.  Lab Results  Component Value Date   HGBA1C 5.7 (H) 01/08/2020   Lab Results  Component Value Date   INSULIN 18.0 01/08/2020   2. Vitamin D deficiency Julie Mcneil's Vitamin D level was 25.0 on 01/08/2020. She is currently taking prescription vitamin D 50,000 IU each week. She denies nausea, vomiting or muscle weakness.  3. Other depression, with emotional eating  Julie Mcneil is struggling with emotional eating and using food for comfort to the extent that it is negatively impacting her health. She has been working on behavior modification techniques to help reduce her emotional eating and has been unsuccessful. She shows no sign of suicidal or homicidal ideations.  4. At risk for diabetes mellitus Julie Mcneil is at higher than average risk for developing diabetes due to her obesity.   Assessment/Plan:   1. Prediabetes Julie Mcneil will continue to work on weight loss, exercise, and decreasing simple carbohydrates to help  decrease the risk of diabetes.  Julie Mcneil will stop the metformin due to the size of the pill.  2. Vitamin D deficiency Low Vitamin D level contributes to fatigue and are associated with obesity, breast, and colon cancer. She agrees to continue to take prescription Vitamin D @50 ,000 IU every week and will follow-up for routine testing of Vitamin D, at least 2-3 times per year to avoid over-replacement.  Orders - Vitamin D, Ergocalciferol, (DRISDOL) 1.25 MG (50000 UNIT) CAPS capsule; Take 1 capsule (50,000 Units total) by mouth every 7 (seven) days.  Dispense: 4 capsule; Refill: 0  3. Other depression, with emotional eating  Behavior modification techniques were discussed today to help Julie Mcneil deal with her emotional/non-hunger eating behaviors.  Orders and follow up as documented in patient record.   Orders - buPROPion (WELLBUTRIN SR) 150 MG 12 hr tablet; Take 1 tablet (150 mg total) by mouth daily.  Dispense: 30 tablet; Refill: 0  4. At risk for diabetes mellitus Julie Mcneil was given approximately 15 minutes of diabetes education and counseling today. We discussed intensive lifestyle modifications today with an emphasis on weight loss as well as increasing exercise and decreasing simple carbohydrates in her diet. We also reviewed medication options with an emphasis on risk versus benefit of those discussed.   Repetitive spaced learning was employed today to elicit superior memory formation and behavioral change.  5. Class 3 severe obesity with serious comorbidity and body mass index (BMI) of 45.0 to 49.9 in adult, unspecified obesity type [facility]) Julie Mcneil is currently in the action stage of change.  As such, her goal is to continue with weight loss efforts. She has agreed to the Category 3 Plan.   Exercise goals: For substantial health benefits, adults should do at least 150 minutes (2 hours and 30 minutes) a week of moderate-intensity, or 75 minutes (1 hour and 15 minutes) a week of  vigorous-intensity aerobic physical activity, or an equivalent combination of moderate- and vigorous-intensity aerobic activity. Aerobic activity should be performed in episodes of at least 10 minutes, and preferably, it should be spread throughout the week.  Behavioral modification strategies: increasing lean protein intake, decreasing simple carbohydrates, increasing vegetables and increasing water intake.  Tim has agreed to follow-up with our clinic in 2 weeks. She was informed of the importance of frequent follow-up visits to maximize her success with intensive lifestyle modifications for her multiple health conditions.   Objective:   Blood pressure 129/87, pulse 79, temperature 98.5 F (36.9 C), temperature source Oral, height 5\' 4"  (1.626 m), weight 274 lb (124.3 kg), last menstrual period 02/27/2020, SpO2 99 %. Body mass index is 47.03 kg/m.  General: Cooperative, alert, well developed, in no acute distress. HEENT: Conjunctivae and lids unremarkable. Cardiovascular: Regular rhythm.  Lungs: Normal work of breathing. Neurologic: No focal deficits.   Lab Results  Component Value Date   CREATININE 0.84 01/08/2020   BUN 12 01/08/2020   NA 142 01/08/2020   K 4.8 01/08/2020   CL 104 01/08/2020   CO2 24 01/08/2020   Lab Results  Component Value Date   ALT 13 01/08/2020   AST 15 01/08/2020   ALKPHOS 99 01/08/2020   BILITOT 0.6 01/08/2020   Lab Results  Component Value Date   HGBA1C 5.7 (H) 01/08/2020   HGBA1C 5.5 04/09/2017   HGBA1C 5.6 09/20/2015   HGBA1C 5.8 (H) 07/28/2015   HGBA1C 5.9 11/16/2014   Lab Results  Component Value Date   INSULIN 18.0 01/08/2020   Lab Results  Component Value Date   TSH 1.990 01/08/2020   Lab Results  Component Value Date   CHOL 190 01/08/2020   HDL 52 01/08/2020   LDLCALC 115 (H) 01/08/2020   TRIG 132 01/08/2020   CHOLHDL 3.7 01/08/2020   Lab Results  Component Value Date   WBC 9.5 01/08/2020   HGB 11.7 01/08/2020    HCT 37.8 01/08/2020   MCV 85 01/08/2020   PLT 400 01/08/2020   Lab Results  Component Value Date   IRON 37 01/08/2020   TIBC 327 01/08/2020   FERRITIN 26 01/08/2020   Attestation Statements:   Reviewed by clinician on day of visit: allergies, medications, problem list, medical history, surgical history, family history, social history, and previous encounter notes.  I, Water quality scientist, CMA, am acting as Location manager for [employer], DO.  I have reviewed the above documentation for accuracy and completeness, and I agree with the above. Briscoe Deutscher, DO

## 2020-03-23 ENCOUNTER — Other Ambulatory Visit (INDEPENDENT_AMBULATORY_CARE_PROVIDER_SITE_OTHER): Payer: Self-pay | Admitting: Family Medicine

## 2020-03-23 DIAGNOSIS — E559 Vitamin D deficiency, unspecified: Secondary | ICD-10-CM

## 2020-03-24 ENCOUNTER — Other Ambulatory Visit: Payer: Self-pay

## 2020-03-24 ENCOUNTER — Encounter (INDEPENDENT_AMBULATORY_CARE_PROVIDER_SITE_OTHER): Payer: Self-pay | Admitting: Family Medicine

## 2020-03-24 ENCOUNTER — Ambulatory Visit (INDEPENDENT_AMBULATORY_CARE_PROVIDER_SITE_OTHER): Payer: 59 | Admitting: Family Medicine

## 2020-03-24 VITALS — BP 112/80 | HR 87 | Temp 98.3°F | Ht 64.0 in | Wt 258.0 lb

## 2020-03-24 DIAGNOSIS — Z9189 Other specified personal risk factors, not elsewhere classified: Secondary | ICD-10-CM | POA: Diagnosis not present

## 2020-03-24 DIAGNOSIS — E559 Vitamin D deficiency, unspecified: Secondary | ICD-10-CM | POA: Diagnosis not present

## 2020-03-24 DIAGNOSIS — Z6841 Body Mass Index (BMI) 40.0 and over, adult: Secondary | ICD-10-CM

## 2020-03-24 DIAGNOSIS — F3289 Other specified depressive episodes: Secondary | ICD-10-CM

## 2020-03-24 DIAGNOSIS — R7303 Prediabetes: Secondary | ICD-10-CM

## 2020-03-24 MED ORDER — VITAMIN D (ERGOCALCIFEROL) 1.25 MG (50000 UNIT) PO CAPS: 50000.0000 [IU] | ORAL_CAPSULE | ORAL | 0 refills | Status: DC

## 2020-03-24 MED ORDER — BUPROPION HCL ER (SR) 150 MG PO TB12: 150.0000 mg | ORAL_TABLET | Freq: Every day | ORAL | 0 refills | Status: DC

## 2020-03-24 NOTE — Progress Notes (Signed)
Chief Complaint:   OBESITY Julie Mcneil is here to discuss her progress with her obesity treatment plan along with follow-up of her obesity related diagnoses. Julie Mcneil is on the Category 3 Plan and states she is following her eating plan approximately 100% of the time. Julie Mcneil states she is walking 2-3 miles 7 times per week.  Today's visit was #: 5 Starting weight: 273 lbs Starting date: 01/08/2020 Today's weight: 258 lbs Today's date: 03/24/2020 Total lbs lost to date: 15 lbs Total lbs lost since last in-office visit: 16 lbs  Interim History: Julie Mcneil is using [employer] for breakfast and lunches.  She has increased the amount of walking she is doing.  She has some pain in her left achilles when she overdoes the walking.  Subjective:   1. Prediabetes Julie Mcneil has a diagnosis of prediabetes based on her elevated HgA1c and was informed this puts her at greater risk of developing diabetes. She continues to work on diet and exercise to decrease her risk of diabetes. She denies nausea or hypoglycemia.  Lab Results  Component Value Date   HGBA1C 5.7 (H) 01/08/2020   Lab Results  Component Value Date   INSULIN 18.0 01/08/2020   2. Vitamin D deficiency Julie Mcneil's Vitamin D level was 25.0 on 01/08/2020. She is currently taking prescription vitamin D 50,000 IU each week. She denies nausea, vomiting or muscle weakness.  3. Other depression, with emotional eating  Julie Mcneil is struggling with emotional eating and using food for comfort to the extent that it is negatively impacting her health. She has been working on behavior modification techniques to help reduce her emotional eating and has been successful. She shows no sign of suicidal or homicidal ideations.  She is taking Wellbutrin and Celexa.  4. At risk for osteoporosis Julie Mcneil is at higher risk of osteopenia and osteoporosis due to Vitamin D deficiency.   Assessment/Plan:   1. Prediabetes Julie Mcneil  will continue to work on weight loss, exercise, and decreasing simple carbohydrates to help decrease the risk of diabetes.   2. Vitamin D deficiency Low Vitamin D level contributes to fatigue and are associated with obesity, breast, and colon cancer. She agrees to continue to take prescription Vitamin D @50 ,000 IU every week and will follow-up for routine testing of Vitamin D, at least 2-3 times per year to avoid over-replacement.  Orders - Vitamin D, Ergocalciferol, (DRISDOL) 1.25 MG (50000 UNIT) CAPS capsule; Take 1 capsule (50,000 Units total) by mouth every 7 (seven) days.  Dispense: 4 capsule; Refill: 0  3. Other depression, with emotional eating  Behavior modification techniques were discussed today to help Julie Mcneil deal with her emotional/non-hunger eating behaviors.  Orders and follow up as documented in patient record.   Orders - buPROPion (WELLBUTRIN SR) 150 MG 12 hr tablet; Take 1 tablet (150 mg total) by mouth daily.  Dispense: 30 tablet; Refill: 0  4. At risk for osteoporosis Julie Mcneil was given approximately 15 minutes of osteoporosis prevention counseling today. Julie Mcneil is at risk for osteopenia and osteoporosis due to her Vitamin D deficiency. She was encouraged to take her Vitamin D and follow her higher calcium diet and increase strengthening exercise to help strengthen her bones and decrease her risk of osteopenia and osteoporosis.  Repetitive spaced learning was employed today to elicit superior memory formation and behavioral change.  5. Class 3 severe obesity with serious comorbidity and body mass index (BMI) of 40.0 to 44.9 in adult, unspecified obesity type ([facility]) Julie Mcneil is  currently in the action stage of change. As such, her goal is to continue with weight loss efforts. She has agreed to the Category 3 Plan.   Exercise goals: As is.  Behavioral modification strategies: planning for success.  Julie Mcneil has agreed to follow-up with our clinic in 2 weeks. She was  informed of the importance of frequent follow-up visits to maximize her success with intensive lifestyle modifications for her multiple health conditions.   Objective:   Blood pressure 112/80, pulse 87, temperature 98.3 F (36.8 C), temperature source Oral, height 5\' 4"  (1.626 m), weight 258 lb (117 kg), last menstrual period 02/27/2020, SpO2 99 %. Body mass index is 44.29 kg/m.  General: Cooperative, alert, well developed, in no acute distress. HEENT: Conjunctivae and lids unremarkable. Cardiovascular: Regular rhythm.  Lungs: Normal work of breathing. Neurologic: No focal deficits.   Lab Results  Component Value Date   CREATININE 0.84 01/08/2020   BUN 12 01/08/2020   NA 142 01/08/2020   K 4.8 01/08/2020   CL 104 01/08/2020   CO2 24 01/08/2020   Lab Results  Component Value Date   ALT 13 01/08/2020   AST 15 01/08/2020   ALKPHOS 99 01/08/2020   BILITOT 0.6 01/08/2020   Lab Results  Component Value Date   HGBA1C 5.7 (H) 01/08/2020   HGBA1C 5.5 04/09/2017   HGBA1C 5.6 09/20/2015   HGBA1C 5.8 (H) 07/28/2015   HGBA1C 5.9 11/16/2014   Lab Results  Component Value Date   INSULIN 18.0 01/08/2020   Lab Results  Component Value Date   TSH 1.990 01/08/2020   Lab Results  Component Value Date   CHOL 190 01/08/2020   HDL 52 01/08/2020   LDLCALC 115 (H) 01/08/2020   TRIG 132 01/08/2020   CHOLHDL 3.7 01/08/2020   Lab Results  Component Value Date   WBC 9.5 01/08/2020   HGB 11.7 01/08/2020   HCT 37.8 01/08/2020   MCV 85 01/08/2020   PLT 400 01/08/2020   Lab Results  Component Value Date   IRON 37 01/08/2020   TIBC 327 01/08/2020   FERRITIN 26 01/08/2020   Attestation Statements:   Reviewed by clinician on day of visit: allergies, medications, problem list, medical history, surgical history, family history, social history, and previous encounter notes.  I, 03/09/2020, CMA, am acting as Insurance claims handler for Energy manager, DO.  I have reviewed the above  documentation for accuracy and completeness, and I agree with the above. [employer], DO

## 2020-04-13 ENCOUNTER — Other Ambulatory Visit: Payer: Self-pay

## 2020-04-13 ENCOUNTER — Ambulatory Visit (INDEPENDENT_AMBULATORY_CARE_PROVIDER_SITE_OTHER): Payer: 59 | Admitting: Family Medicine

## 2020-04-13 ENCOUNTER — Encounter (INDEPENDENT_AMBULATORY_CARE_PROVIDER_SITE_OTHER): Payer: Self-pay | Admitting: Family Medicine

## 2020-04-13 VITALS — BP 126/82 | HR 72 | Temp 98.1°F | Ht 64.0 in | Wt 254.0 lb

## 2020-04-13 DIAGNOSIS — E559 Vitamin D deficiency, unspecified: Secondary | ICD-10-CM

## 2020-04-13 DIAGNOSIS — F3289 Other specified depressive episodes: Secondary | ICD-10-CM | POA: Diagnosis not present

## 2020-04-13 DIAGNOSIS — Z6841 Body Mass Index (BMI) 40.0 and over, adult: Secondary | ICD-10-CM

## 2020-04-13 DIAGNOSIS — K5909 Other constipation: Secondary | ICD-10-CM

## 2020-04-13 DIAGNOSIS — Z9189 Other specified personal risk factors, not elsewhere classified: Secondary | ICD-10-CM | POA: Diagnosis not present

## 2020-04-13 MED ORDER — CITALOPRAM HYDROBROMIDE 20 MG PO TABS: 20.0000 mg | ORAL_TABLET | Freq: Every day | ORAL | 0 refills | Status: DC

## 2020-04-13 MED ORDER — VITAMIN D (ERGOCALCIFEROL) 1.25 MG (50000 UNIT) PO CAPS: 50000.0000 [IU] | ORAL_CAPSULE | ORAL | 0 refills | Status: DC

## 2020-04-13 NOTE — Progress Notes (Signed)
Chief Complaint:   OBESITY Julie Mcneil is here to discuss her progress with her obesity treatment plan along with follow-up of her obesity related diagnoses. Julie Mcneil is on the Category 3 Plan and states she is following her eating plan approximately 100% of the time. Julie Mcneil states she is walking 4 miles 7 times per week.  Today's visit was #: 6 Starting weight: 273 lbs Starting date: 01/08/2020 Today's weight: 254 lbs Today's date: 04/13/2020 Total lbs lost to date: 19 lbs Total lbs lost since last in-office visit: 4 lbs  Interim History: Julie Mcneil has been hiking the trails.  She recently got new shoes and feels much better.  She is using [employer] for healthy food choices.  Subjective:   1. Vitamin D deficiency Julie Mcneil's Vitamin D level was 25.0 on 01/08/2020. She is currently taking prescription vitamin D 50,000 IU each week. She denies nausea, vomiting or muscle weakness.  2. Other constipation Julie Mcneil notes constipation.  She has started taking MiraLAX twice daily.  Constipation is improving.   3. Other depression, with emotional eating  Julie Mcneil is struggling with emotional eating and using food for comfort to the extent that it is negatively impacting her health. She has been working on behavior modification techniques to help reduce her emotional eating and has been successful.   Assessment/Plan:   1. Vitamin D deficiency Low Vitamin D level contributes to fatigue and are associated with obesity, breast, and colon cancer. She agrees to continue to take prescription Vitamin D @50 ,000 IU every week and will follow-up for routine testing of Vitamin D, at least 2-3 times per year to avoid over-replacement.  Orders - Vitamin D, Ergocalciferol, (DRISDOL) 1.25 MG (50000 UNIT) CAPS capsule; Take 1 capsule (50,000 Units total) by mouth every 7 (seven) days.  Dispense: 4 capsule; Refill: 0  2. Other constipation Julie Mcneil was informed that a decrease in bowel  movement frequency is normal while losing weight, but stools should not be hard or painful. Orders and follow up as documented in patient record.   Counseling Getting to Good Bowel Health: Your goal is to have one soft bowel movement each day. Drink at least 8 glasses of water each day. Eat plenty of fiber (goal is over 25 grams each day). It is best to get most of your fiber from dietary sources which includes leafy green vegetables, fresh fruit, and whole grains. You may need to add fiber with the help of OTC fiber supplements. These include Metamucil, Citrucel, and Flaxseed. If you are still having trouble, try adding Miralax or Magnesium Citrate. If all of these changes do not work, Julie Mcneil.  3. Other depression, with emotional eating  Behavior modification techniques were discussed today to help Julie Mcneil deal with her emotional/non-hunger eating behaviors.  Orders and follow up as documented in patient record.   Orders - citalopram (CELEXA) 20 MG tablet; Take 1 tablet (20 mg total) by mouth daily.  Dispense: 30 tablet; Refill: 0  4. At risk for heart disease Julie Mcneil was given approximately 15 minutes of coronary artery disease prevention counseling today. She is 39 y.o. female and has risk factors for heart disease including obesity. We discussed intensive lifestyle modifications today with an emphasis on specific weight loss instructions and strategies.   Repetitive spaced learning was employed today to elicit superior memory formation and behavioral change.  5. Class 3 severe obesity with serious comorbidity and body mass index (BMI) of 40.0 to 44.9 in adult, unspecified obesity type (  Julie Mcneil) Julie Mcneil is currently in the action stage of change. As such, her goal is to continue with weight loss efforts. She has agreed to the Category 3 Plan.   Exercise goals: For substantial health benefits, adults should do at least 150 minutes (2 hours and 30 minutes) a week of  moderate-intensity, or 75 minutes (1 hour and 15 minutes) a week of vigorous-intensity aerobic physical activity, or an equivalent combination of moderate- and vigorous-intensity aerobic activity. Aerobic activity should be performed in episodes of at least 10 minutes, and preferably, it should be spread throughout the week.  Behavioral modification strategies: increasing lean protein intake and increasing water intake.  Julie Mcneil has agreed to follow-up with our clinic in 2-3 weeks. She was informed of the importance of frequent follow-up visits to maximize her success with intensive lifestyle modifications for her multiple health conditions.   Objective:   Blood pressure 126/82, pulse 72, temperature 98.1 F (36.7 C), temperature source Oral, height 5\' 4"  (1.626 m), weight 254 lb (115.2 kg), SpO2 99 %. Body mass index is 43.6 kg/m.  General: Cooperative, alert, well developed, in no acute distress. HEENT: Conjunctivae and lids unremarkable. Cardiovascular: Regular rhythm.  Lungs: Normal work of breathing. Neurologic: No focal deficits.   Lab Results  Component Value Date   CREATININE 0.84 01/08/2020   BUN 12 01/08/2020   NA 142 01/08/2020   K 4.8 01/08/2020   CL 104 01/08/2020   CO2 24 01/08/2020   Lab Results  Component Value Date   ALT 13 01/08/2020   AST 15 01/08/2020   ALKPHOS 99 01/08/2020   BILITOT 0.6 01/08/2020   Lab Results  Component Value Date   HGBA1C 5.7 (H) 01/08/2020   HGBA1C 5.5 04/09/2017   HGBA1C 5.6 09/20/2015   HGBA1C 5.8 (H) 07/28/2015   HGBA1C 5.9 11/16/2014   Lab Results  Component Value Date   INSULIN 18.0 01/08/2020   Lab Results  Component Value Date   TSH 1.990 01/08/2020   Lab Results  Component Value Date   CHOL 190 01/08/2020   HDL 52 01/08/2020   LDLCALC 115 (H) 01/08/2020   TRIG 132 01/08/2020   CHOLHDL 3.7 01/08/2020   Lab Results  Component Value Date   WBC 9.5 01/08/2020   HGB 11.7 01/08/2020   HCT 37.8 01/08/2020    MCV 85 01/08/2020   PLT 400 01/08/2020   Lab Results  Component Value Date   IRON 37 01/08/2020   TIBC 327 01/08/2020   FERRITIN 26 01/08/2020   Attestation Statements:   Reviewed by clinician on day of visit: allergies, medications, problem list, medical history, surgical history, family history, social history, and previous encounter notes.  I, Water quality scientist, CMA, am acting as transcriptionist for Briscoe Deutscher, DO  I have reviewed the above documentation for accuracy and completeness, and I agree with the above. Briscoe Deutscher, DO

## 2020-04-23 ENCOUNTER — Other Ambulatory Visit: Payer: Self-pay | Admitting: Internal Medicine

## 2020-04-23 ENCOUNTER — Other Ambulatory Visit (INDEPENDENT_AMBULATORY_CARE_PROVIDER_SITE_OTHER): Payer: Self-pay | Admitting: Family Medicine

## 2020-04-23 DIAGNOSIS — F3289 Other specified depressive episodes: Secondary | ICD-10-CM

## 2020-04-27 ENCOUNTER — Encounter (INDEPENDENT_AMBULATORY_CARE_PROVIDER_SITE_OTHER): Payer: Self-pay | Admitting: Family Medicine

## 2020-04-27 ENCOUNTER — Ambulatory Visit (INDEPENDENT_AMBULATORY_CARE_PROVIDER_SITE_OTHER): Payer: 59 | Admitting: Family Medicine

## 2020-04-27 ENCOUNTER — Other Ambulatory Visit: Payer: Self-pay

## 2020-04-27 VITALS — BP 124/84 | HR 56 | Temp 97.7°F | Ht 64.0 in | Wt 249.0 lb

## 2020-04-27 DIAGNOSIS — Z9189 Other specified personal risk factors, not elsewhere classified: Secondary | ICD-10-CM

## 2020-04-27 DIAGNOSIS — Z6841 Body Mass Index (BMI) 40.0 and over, adult: Secondary | ICD-10-CM

## 2020-04-27 DIAGNOSIS — E559 Vitamin D deficiency, unspecified: Secondary | ICD-10-CM

## 2020-04-27 DIAGNOSIS — F3289 Other specified depressive episodes: Secondary | ICD-10-CM | POA: Diagnosis not present

## 2020-04-27 MED ORDER — VITAMIN D (ERGOCALCIFEROL) 1.25 MG (50000 UNIT) PO CAPS: 50000.0000 [IU] | ORAL_CAPSULE | ORAL | 0 refills | Status: DC

## 2020-04-27 MED ORDER — BUPROPION HCL ER (SR) 150 MG PO TB12: 150.0000 mg | ORAL_TABLET | Freq: Every day | ORAL | 0 refills | Status: DC

## 2020-04-27 NOTE — Progress Notes (Signed)
Chief Complaint:   OBESITY Julie Mcneil is here to discuss her progress with her obesity treatment plan along with follow-up of her obesity related diagnoses. Julie Mcneil is on the Category 3 Plan and states she is following her eating plan approximately 99% of the time. Julie Mcneil states she is walking and doing cardio for 60 minutes 7 times per week.  Today's visit was #: 7 Starting weight: 273 lbs Starting date: 01/08/2020 Today's weight: 249 lbs Today's date: 04/27/2020 Total lbs lost to date: 24 lbs Total lbs lost since last in-office visit: 5 lbs  Interim History: Julie Mcneil says she is still doing [employer].  She has been doing functional training by kickboxing at [employer].  She says it costs $150 for 8 classes and then drop-in.  Subjective:   1. Vitamin D deficiency Julie Mcneil's Vitamin D level was 25.0 on 01/08/2020. She is currently taking prescription vitamin D 50,000 IU each week. She denies nausea, vomiting or muscle weakness.  2. Other depression, with emotional eating  Julie Mcneil is struggling with emotional eating and using food for comfort to the extent that it is negatively impacting her health. She has been working on behavior modification techniques to help reduce her emotional eating and has been somewhat successful. She shows no sign of suicidal or homicidal ideations.  3. At risk for constipation Julie Mcneil is at increased risk for constipation due to inadequate water intake, changes in diet, and/or use of medications such as GLP1 agonists. Julie Mcneil denies hard, infrequent stools currently.   Assessment/Plan:   1. Vitamin D deficiency Low Vitamin D level contributes to fatigue and are associated with obesity, breast, and colon cancer. She agrees to continue to take prescription Vitamin D @50 ,000 IU every week and will follow-up for routine testing of Vitamin D, at least 2-3 times per year to avoid over-replacement.  Orders - Vitamin D, Ergocalciferol,  (DRISDOL) 1.25 MG (50000 UNIT) CAPS capsule; Take 1 capsule (50,000 Units total) by mouth every 7 (seven) days.  Dispense: 4 capsule; Refill: 0  2. Other depression, with emotional eating  Behavior modification techniques were discussed today to help Julie Mcneil deal with her emotional/non-hunger eating behaviors.  Orders and follow up as documented in patient record.   Orders - buPROPion (WELLBUTRIN SR) 150 MG 12 hr tablet; Take 1 tablet (150 mg total) by mouth daily.  Dispense: 30 tablet; Refill: 0  3. At risk for constipation Julie Mcneil was given approximately 15 minutes of counseling today regarding prevention of constipation. She was encouraged to increase water and fiber intake.   4. Class 3 severe obesity with serious comorbidity and body mass index (BMI) of 40.0 to 44.9 in adult, unspecified obesity type [facility]) Julie Mcneil is currently in the action stage of change. As such, her goal is to continue with weight loss efforts. She has agreed to the Category 3 Plan.   Exercise goals: For substantial health benefits, adults should do at least 150 minutes (2 hours and 30 minutes) a week of moderate-intensity, or 75 minutes (1 hour and 15 minutes) a week of vigorous-intensity aerobic physical activity, or an equivalent combination of moderate- and vigorous-intensity aerobic activity. Aerobic activity should be performed in episodes of at least 10 minutes, and preferably, it should be spread throughout the week.  Behavioral modification strategies: increasing lean protein intake and increasing water intake.  Julie Mcneil has agreed to follow-up with our clinic in 2-4 weeks. She was informed of the importance of frequent follow-up visits to maximize her success with intensive  lifestyle modifications for her multiple health conditions.   Objective:   Blood pressure 124/84, pulse (!) 56, temperature 97.7 F (36.5 C), temperature source Oral, height 5\' 4"  (1.626 m), weight 249 lb (112.9 kg), SpO2 99  %. Body mass index is 42.74 kg/m.  General: Cooperative, alert, well developed, in no acute distress. HEENT: Conjunctivae and lids unremarkable. Cardiovascular: Regular rhythm.  Lungs: Normal work of breathing. Neurologic: No focal deficits.   Lab Results  Component Value Date   CREATININE 0.84 01/08/2020   BUN 12 01/08/2020   NA 142 01/08/2020   K 4.8 01/08/2020   CL 104 01/08/2020   CO2 24 01/08/2020   Lab Results  Component Value Date   ALT 13 01/08/2020   AST 15 01/08/2020   ALKPHOS 99 01/08/2020   BILITOT 0.6 01/08/2020   Lab Results  Component Value Date   HGBA1C 5.7 (H) 01/08/2020   HGBA1C 5.5 04/09/2017   HGBA1C 5.6 09/20/2015   HGBA1C 5.8 (H) 07/28/2015   HGBA1C 5.9 11/16/2014   Lab Results  Component Value Date   INSULIN 18.0 01/08/2020   Lab Results  Component Value Date   TSH 1.990 01/08/2020   Lab Results  Component Value Date   CHOL 190 01/08/2020   HDL 52 01/08/2020   LDLCALC 115 (H) 01/08/2020   TRIG 132 01/08/2020   CHOLHDL 3.7 01/08/2020   Lab Results  Component Value Date   WBC 9.5 01/08/2020   HGB 11.7 01/08/2020   HCT 37.8 01/08/2020   MCV 85 01/08/2020   PLT 400 01/08/2020   Lab Results  Component Value Date   IRON 37 01/08/2020   TIBC 327 01/08/2020   FERRITIN 26 01/08/2020   Attestation Statements:   Reviewed by clinician on day of visit: allergies, medications, problem list, medical history, surgical history, family history, social history, and previous encounter notes.  I, 03/09/2020, CMA, am acting as transcriptionist for Insurance claims handler, DO  I have reviewed the above documentation for accuracy and completeness, and I agree with the above. Helane Rima, DO

## 2020-05-18 ENCOUNTER — Other Ambulatory Visit (INDEPENDENT_AMBULATORY_CARE_PROVIDER_SITE_OTHER): Payer: Self-pay | Admitting: Family Medicine

## 2020-05-18 ENCOUNTER — Ambulatory Visit (INDEPENDENT_AMBULATORY_CARE_PROVIDER_SITE_OTHER): Payer: 59 | Admitting: Family Medicine

## 2020-05-18 ENCOUNTER — Other Ambulatory Visit: Payer: Self-pay

## 2020-05-18 ENCOUNTER — Encounter (INDEPENDENT_AMBULATORY_CARE_PROVIDER_SITE_OTHER): Payer: Self-pay | Admitting: Family Medicine

## 2020-05-18 VITALS — BP 104/67 | HR 62 | Temp 98.2°F | Ht 64.0 in | Wt 247.0 lb

## 2020-05-18 DIAGNOSIS — E559 Vitamin D deficiency, unspecified: Secondary | ICD-10-CM | POA: Diagnosis not present

## 2020-05-18 DIAGNOSIS — F419 Anxiety disorder, unspecified: Secondary | ICD-10-CM

## 2020-05-18 DIAGNOSIS — Z9189 Other specified personal risk factors, not elsewhere classified: Secondary | ICD-10-CM | POA: Diagnosis not present

## 2020-05-18 DIAGNOSIS — F3289 Other specified depressive episodes: Secondary | ICD-10-CM

## 2020-05-18 DIAGNOSIS — Z6841 Body Mass Index (BMI) 40.0 and over, adult: Secondary | ICD-10-CM

## 2020-05-18 DIAGNOSIS — E66813 Obesity, class 3: Secondary | ICD-10-CM

## 2020-05-18 MED ORDER — VITAMIN D (ERGOCALCIFEROL) 1.25 MG (50000 UNIT) PO CAPS: 50000.0000 [IU] | ORAL_CAPSULE | ORAL | 0 refills | Status: DC

## 2020-05-18 MED ORDER — BUPROPION HCL ER (SR) 150 MG PO TB12: 150.0000 mg | ORAL_TABLET | Freq: Every day | ORAL | 0 refills | Status: DC

## 2020-05-18 MED ORDER — CITALOPRAM HYDROBROMIDE 20 MG PO TABS: 20.0000 mg | ORAL_TABLET | Freq: Every day | ORAL | 0 refills | Status: DC

## 2020-05-18 NOTE — Progress Notes (Signed)
Chief Complaint:   OBESITY Julie Mcneil is here to discuss her progress with her obesity treatment plan along with follow-up of her obesity related diagnoses. Julie Mcneil is on the Category 3 Plan and states she is following her eating plan approximately 90% of the time. Julie Mcneil states she is walking/kickboxing for 60 minutes 5-7 times per week.  Today's visit was #: 8 Starting weight: 273 lbs Starting date: 01/08/2020 Today's weight: 247 lbs Today's date: 05/18/2020 Total lbs lost to date: 26 lbs Total lbs lost since last in-office visit: 2 lbs  Interim History: Julie Mcneil says that she is "in a rut".  She says she has no motivation.  She would like to have more weight loss each visit.  Bioimpedence shows she is down 4 pounds of fat, up 2 pounds of muscle, and her visceral fat is down to 11 points.  A1c is 5.3, LDL 99, HDL 53, ALT 11.  Subjective:   1. Vitamin D deficiency Julie Mcneil's Vitamin D level was 25.0 on 01/08/2020. She is currently taking prescription vitamin D 50,000 IU each week. She denies nausea, vomiting or muscle weakness.  2. Anxiety Julie Mcneil takes Celexa 20 mg daily to help with her anxiety symptoms.  Assessment/Plan:   1. Vitamin D deficiency Low Vitamin D level contributes to fatigue and are associated with obesity, breast, and colon cancer. She agrees to continue to take prescription Vitamin D @50 ,000 IU every week and will follow-up for routine testing of Vitamin D, at least 2-3 times per year to avoid over-replacement.  Orders - Vitamin D, Ergocalciferol, (DRISDOL) 1.25 MG (50000 UNIT) CAPS capsule; Take 1 capsule (50,000 Units total) by mouth every 7 (seven) days.  Dispense: 4 capsule; Refill: 0  2. Anxiety Behavior modification techniques were discussed today to help Julie Mcneil deal with her anxiety.  Orders and follow up as documented in patient record.   Orders - citalopram (CELEXA) 20 MG tablet; Take 1 tablet (20 mg total) by mouth daily.  Dispense: 90  tablet; Refill: 0  3. At risk for deficient intake of food Julie Mcneil was given approximately 15 minutes of deficit intake of food prevention counseling today. Julie Mcneil is at risk for eating too few calories based on current food recall. She was encouraged to focus on meeting caloric and protein goals according to her recommended meal plan.   4. Class 3 severe obesity with serious comorbidity and body mass index (BMI) of 40.0 to 44.9 in adult, unspecified obesity type [facility]) Julie Mcneil is currently in the action stage of change. As such, her goal is to continue with weight loss efforts. She has agreed to keeping a food journal and adhering to recommended goals of 1500-1600 calories and 125+ grams of protein.   Exercise goals: For substantial health benefits, adults should do at least 150 minutes (2 hours and 30 minutes) a week of moderate-intensity, or 75 minutes (1 hour and 15 minutes) a week of vigorous-intensity aerobic physical activity, or an equivalent combination of moderate- and vigorous-intensity aerobic activity. Aerobic activity should be performed in episodes of at least 10 minutes, and preferably, it should be spread throughout the week.  Behavioral modification strategies: increasing lean protein intake and decreasing simple carbohydrates.  Julie Mcneil has agreed to follow-up with our clinic in 2-3 weeks. She was informed of the importance of frequent follow-up visits to maximize her success with intensive lifestyle modifications for her multiple health conditions.   Objective:   Blood pressure 104/67, pulse 62, temperature 98.2 F (36.8 C), temperature source Oral,  height 5\' 4"  (1.626 m), weight 247 lb (112 kg), SpO2 98 %. Body mass index is 42.4 kg/m.  General: Cooperative, alert, well developed, in no acute distress. HEENT: Conjunctivae and lids unremarkable. Cardiovascular: Regular rhythm.  Lungs: Normal work of breathing. Neurologic: No focal deficits.   Lab Results    Component Value Date   CREATININE 0.84 01/08/2020   BUN 12 01/08/2020   NA 142 01/08/2020   K 4.8 01/08/2020   CL 104 01/08/2020   CO2 24 01/08/2020   Lab Results  Component Value Date   ALT 13 01/08/2020   AST 15 01/08/2020   ALKPHOS 99 01/08/2020   BILITOT 0.6 01/08/2020   Lab Results  Component Value Date   HGBA1C 5.7 (H) 01/08/2020   HGBA1C 5.5 04/09/2017   HGBA1C 5.6 09/20/2015   HGBA1C 5.8 (H) 07/28/2015   HGBA1C 5.9 11/16/2014   Lab Results  Component Value Date   INSULIN 18.0 01/08/2020   Lab Results  Component Value Date   TSH 1.990 01/08/2020   Lab Results  Component Value Date   CHOL 190 01/08/2020   HDL 52 01/08/2020   LDLCALC 115 (H) 01/08/2020   TRIG 132 01/08/2020   CHOLHDL 3.7 01/08/2020   Lab Results  Component Value Date   WBC 9.5 01/08/2020   HGB 11.7 01/08/2020   HCT 37.8 01/08/2020   MCV 85 01/08/2020   PLT 400 01/08/2020   Lab Results  Component Value Date   IRON 37 01/08/2020   TIBC 327 01/08/2020   FERRITIN 26 01/08/2020   Attestation Statements:   Reviewed by clinician on day of visit: allergies, medications, problem list, medical history, surgical history, family history, social history, and previous encounter notes.  I, 03/09/2020, CMA, am acting as transcriptionist for Insurance claims handler, DO  I have reviewed the above documentation for accuracy and completeness, and I agree with the above. Helane Rima, DO

## 2020-05-19 ENCOUNTER — Other Ambulatory Visit (INDEPENDENT_AMBULATORY_CARE_PROVIDER_SITE_OTHER): Payer: Self-pay | Admitting: Family Medicine

## 2020-05-19 DIAGNOSIS — F419 Anxiety disorder, unspecified: Secondary | ICD-10-CM

## 2020-06-03 ENCOUNTER — Encounter (INDEPENDENT_AMBULATORY_CARE_PROVIDER_SITE_OTHER): Payer: Self-pay | Admitting: Family Medicine

## 2020-06-09 ENCOUNTER — Encounter (INDEPENDENT_AMBULATORY_CARE_PROVIDER_SITE_OTHER): Payer: Self-pay | Admitting: Adult Health

## 2020-06-09 ENCOUNTER — Other Ambulatory Visit: Payer: Self-pay

## 2020-06-09 ENCOUNTER — Ambulatory Visit (INDEPENDENT_AMBULATORY_CARE_PROVIDER_SITE_OTHER): Payer: 59 | Admitting: Adult Health

## 2020-06-09 VITALS — BP 128/87 | HR 68 | Temp 98.0°F | Ht 64.0 in | Wt 241.0 lb

## 2020-06-09 DIAGNOSIS — Z9189 Other specified personal risk factors, not elsewhere classified: Secondary | ICD-10-CM | POA: Diagnosis not present

## 2020-06-09 DIAGNOSIS — F419 Anxiety disorder, unspecified: Secondary | ICD-10-CM

## 2020-06-09 DIAGNOSIS — E559 Vitamin D deficiency, unspecified: Secondary | ICD-10-CM

## 2020-06-09 DIAGNOSIS — R7303 Prediabetes: Secondary | ICD-10-CM

## 2020-06-09 DIAGNOSIS — D649 Anemia, unspecified: Secondary | ICD-10-CM | POA: Insufficient documentation

## 2020-06-09 DIAGNOSIS — D508 Other iron deficiency anemias: Secondary | ICD-10-CM

## 2020-06-09 DIAGNOSIS — E66813 Obesity, class 3: Secondary | ICD-10-CM

## 2020-06-09 DIAGNOSIS — Z6841 Body Mass Index (BMI) 40.0 and over, adult: Secondary | ICD-10-CM

## 2020-06-09 MED ORDER — WEGOVY 0.25 MG/0.5ML ~~LOC~~ SOAJ: 0.2500 mg | SUBCUTANEOUS | 0 refills | Status: DC

## 2020-06-09 NOTE — Progress Notes (Signed)
Chief Complaint:   OBESITY Julie Mcneil is here to discuss her progress with her obesity treatment plan along with follow-up of her obesity related diagnoses. Julie Mcneil is on the Category 3 Plan (not journaling) and states she is following her eating plan approximately 90% of the time. Julie Mcneil states she is doing cardio/kickboxing 60 minutes 7 times per week.  Today's visit was #: 9 Starting weight: 273 lbs Starting date: 01/08/2020 Today's weight: 241 lbs Today's date: 06/09/2020 Total lbs lost to date: 32 Total lbs lost since last in-office visit: 6  Interim History: Julie Mcneil has been following a "loose journaling plan" focusing on protein at each meal. She drinks water throughout the day, is unsure of total daily oz intake. She is still interested in starting Wegovy to aid in weight loss efforts- she denies personal or family hx of MTC.  Subjective:   Vitamin D deficiency. Julie Mcneil is on prescription strength Vitamin D supplementation and is tolerating it well.    Ref. Range 01/08/2020 13:11  Vitamin D, 25-Hydroxy Latest Ref Range: 30.0 - 100.0 ng/mL 25.0 (L)   Anxiety. Julie Mcneil is on citalopram 20 mg daily.  Prediabetes. Julie Mcneil has a diagnosis of prediabetes based on her elevated HgA1c and was informed this puts her at greater risk of developing diabetes. She continues to work on diet and exercise to decrease her risk of diabetes. She denies nausea or hypoglycemia. Julie Mcneil is not on metformin.  Lab Results  Component Value Date   HGBA1C 5.7 (H) 01/08/2020   Lab Results  Component Value Date   INSULIN 18.0 01/08/2020   Other iron deficiency anemia. Julie Mcneil is not on Ferrous Sulfate.  CBC Latest Ref Rng & Units 01/08/2020 08/12/2018 03/10/2018  WBC 3.4 - 10.8 x10E3/uL 9.5 12.5(H) 10.9(H)  Hemoglobin 11.1 - 15.9 g/dL 40.9 11.9(L) 12.9  Hematocrit 34.0 - 46.6 % 37.8 37.0 40.1  Platelets 150 - 450 x10E3/uL 400 390 289   Lab Results  Component Value Date    IRON 37 01/08/2020   TIBC 327 01/08/2020   FERRITIN 26 01/08/2020   Lab Results  Component Value Date   VITAMINB12 533 01/08/2020   At risk for nausea. Julie Mcneil is at increased risk for nausea due to starting injectable GLP-1.  Assessment/Plan:   Vitamin D deficiency. Low Vitamin D level contributes to fatigue and are associated with obesity, breast, and colon cancer. She agrees to continue to take prescription Vitamin D as directed (no medication refill today) and VITAMIN D 25 Hydroxy (Vit-D Deficiency, Fractures) level will be checked today.  Anxiety. Julie Mcneil will continue her current SSRI as directed.   Prediabetes. Julie Mcneil will continue to work on weight loss, exercise, and decreasing simple carbohydrates to help decrease the risk of diabetes. Comprehensive metabolic panel, Hemoglobin A1c, Insulin, random, Lipid Panel With LDL/HDL Ratio labs will be checked today.   Other iron deficiency anemia. Orders and follow up as documented in patient record. CBC with Differential/Platelet will be checked today.  Counseling . Iron is essential for our bodies to make red blood cells.  Reasons that someone may be deficient include: an iron-deficient diet (more likely in those following vegan or vegetarian diets), women with heavy menses, patients with GI disorders or poor absorption, patients that have had bariatric surgery, frequent blood donors, patients with cancer, and patients with heart disease.   Marland Kitchen An iron supplement has been recommended. This is found over-the-counter.   Gaspar Cola foods include dark leafy greens, red and white meats, eggs, seafood,  and beans.   . Certain foods and drinks prevent your body from absorbing iron properly. Avoid eating these foods in the same meal as iron-rich foods or with iron supplements. These foods include: coffee, black tea, and red wine; milk, dairy products, and foods that are high in calcium; beans and soybeans; whole grains.  . Constipation can be  a side effect of iron supplementation. Increased water and fiber intake are helpful. Water goal: > 2 liters/day. Fiber goal: > 25 grams/day.   At risk for nausea.  Julie Mcneil was given approximately 15 minutes of nausea prevention counseling today. [name] is at risk for nausea due to her new or current medication. She was encouraged to titrate her medication slowly, make sure to stay hydrated, eat smaller portions throughout the day, and avoid high fat meals.   Class 3 severe obesity with serious comorbidity and body mass index (BMI) of 40.0 to 44.9 in adult, unspecified obesity type (HCC). She denies personal or family hx of MTC.  Julie Mcneil will start Semaglutide-Weight Management (WEGOVY) 0.25 MG/0.5ML SOAJ once weekly, 2 mL with 0 refills.  Julie Mcneil is currently in the action stage of change. As such, her goal is to continue with weight loss efforts. She has agreed to keeping a food journal and adhering to recommended goals of 1500-1600 calories and 125 grams of protein daily.   Exercise goals: Julie Mcneil will continue her current exercise regimen.   Behavioral modification strategies: increasing lean protein intake, meal planning and cooking strategies and planning for success.  Julie Mcneil has agreed to follow-up with our clinic in 2 weeks. She was informed of the importance of frequent follow-up visits to maximize her success with intensive lifestyle modifications for her multiple health conditions.   Julie Mcneil was informed we would discuss her lab results at her next visit unless there is a critical issue that needs to be addressed sooner. Julie Mcneil agreed to keep her next visit at the agreed upon time to discuss these results.  Objective:   Blood pressure 128/87, pulse 68, temperature 98 F (36.7 C), temperature source Oral, height 5\' 4"  (1.626 m), weight 241 lb (109.3 kg), SpO2 96 %. Body mass index is 41.37 kg/m.  General: Cooperative, alert, well developed, in no acute  distress. HEENT: Conjunctivae and lids unremarkable. Cardiovascular: Regular rhythm.  Lungs: Normal work of breathing. Neurologic: No focal deficits.   Lab Results  Component Value Date   CREATININE 0.84 01/08/2020   BUN 12 01/08/2020   NA 142 01/08/2020   K 4.8 01/08/2020   CL 104 01/08/2020   CO2 24 01/08/2020   Lab Results  Component Value Date   ALT 13 01/08/2020   AST 15 01/08/2020   ALKPHOS 99 01/08/2020   BILITOT 0.6 01/08/2020   Lab Results  Component Value Date   HGBA1C 5.7 (H) 01/08/2020   HGBA1C 5.5 04/09/2017   HGBA1C 5.6 09/20/2015   HGBA1C 5.8 (H) 07/28/2015   HGBA1C 5.9 11/16/2014   Lab Results  Component Value Date   INSULIN 18.0 01/08/2020   Lab Results  Component Value Date   TSH 1.990 01/08/2020   Lab Results  Component Value Date   CHOL 190 01/08/2020   HDL 52 01/08/2020   LDLCALC 115 (H) 01/08/2020   TRIG 132 01/08/2020   CHOLHDL 3.7 01/08/2020   Lab Results  Component Value Date   WBC 9.5 01/08/2020   HGB 11.7 01/08/2020   HCT 37.8 01/08/2020   MCV 85 01/08/2020   PLT 400 01/08/2020  Lab Results  Component Value Date   IRON 37 01/08/2020   TIBC 327 01/08/2020   FERRITIN 26 01/08/2020   Attestation Statements:   Reviewed by clinician on day of visit: allergies, medications, problem list, medical history, surgical history, family history, social history, and previous encounter notes.  I, Marianna Payment, am acting as Energy manager for [employer], NP-C   I have reviewed the above documentation for accuracy and completeness, and I agree with the above. -  Julaine Fusi, NP

## 2020-06-10 ENCOUNTER — Encounter (INDEPENDENT_AMBULATORY_CARE_PROVIDER_SITE_OTHER): Payer: Self-pay | Admitting: Adult Health

## 2020-06-10 LAB — HEMOGLOBIN A1C
Est. average glucose Bld gHb Est-mCnc: 117 mg/dL
Hgb A1c MFr Bld: 5.7 % — ABNORMAL HIGH (ref 4.8–5.6)

## 2020-06-10 LAB — LIPID PANEL WITH LDL/HDL RATIO
Cholesterol, Total: 201 mg/dL — ABNORMAL HIGH (ref 100–199)
HDL: 51 mg/dL (ref >39)
LDL Chol Calc (NIH): 125 mg/dL — ABNORMAL HIGH (ref 0–99)
LDL/HDL Ratio: 2.5 ratio (ref 0.0–3.2)
Triglycerides: 138 mg/dL (ref 0–149)
VLDL Cholesterol Cal: 25 mg/dL (ref 5–40)

## 2020-06-10 LAB — COMPREHENSIVE METABOLIC PANEL
ALT: 19 IU/L (ref 0–32)
AST: 19 IU/L (ref 0–40)
Albumin/Globulin Ratio: 1.8 (ref 1.2–2.2)
Albumin: 4.3 g/dL (ref 3.8–4.8)
Alkaline Phosphatase: 116 IU/L (ref 48–121)
BUN/Creatinine Ratio: 12 (ref 9–23)
BUN: 11 mg/dL (ref 6–20)
Bilirubin Total: 0.5 mg/dL (ref 0.0–1.2)
CO2: 23 mmol/L (ref 20–29)
Calcium: 9.5 mg/dL (ref 8.7–10.2)
Chloride: 102 mmol/L (ref 96–106)
Creatinine, Ser: 0.92 mg/dL (ref 0.57–1.00)
GFR calc Af Amer: 91 mL/min/{1.73_m2} (ref >59)
GFR calc non Af Amer: 79 mL/min/{1.73_m2} (ref >59)
Globulin, Total: 2.4 g/dL (ref 1.5–4.5)
Glucose: 96 mg/dL (ref 65–99)
Potassium: 4.5 mmol/L (ref 3.5–5.2)
Sodium: 140 mmol/L (ref 134–144)
Total Protein: 6.7 g/dL (ref 6.0–8.5)

## 2020-06-10 LAB — CBC WITH DIFFERENTIAL/PLATELET
Basophils Absolute: 0.1 10*3/uL (ref 0.0–0.2)
Basos: 1 %
EOS (ABSOLUTE): 0.2 10*3/uL (ref 0.0–0.4)
Eos: 2 %
Hematocrit: 37.6 % (ref 34.0–46.6)
Hemoglobin: 11.6 g/dL (ref 11.1–15.9)
Immature Grans (Abs): 0 10*3/uL (ref 0.0–0.1)
Immature Granulocytes: 0 %
Lymphocytes Absolute: 2 10*3/uL (ref 0.7–3.1)
Lymphs: 25 %
MCH: 26 pg — ABNORMAL LOW (ref 26.6–33.0)
MCHC: 30.9 g/dL — ABNORMAL LOW (ref 31.5–35.7)
MCV: 84 fL (ref 79–97)
Monocytes Absolute: 0.4 10*3/uL (ref 0.1–0.9)
Monocytes: 5 %
Neutrophils Absolute: 5.5 10*3/uL (ref 1.4–7.0)
Neutrophils: 67 %
Platelets: 353 10*3/uL (ref 150–450)
RBC: 4.47 x10E6/uL (ref 3.77–5.28)
RDW: 14.5 % (ref 11.7–15.4)
WBC: 8.1 10*3/uL (ref 3.4–10.8)

## 2020-06-10 LAB — VITAMIN D 25 HYDROXY (VIT D DEFICIENCY, FRACTURES): Vit D, 25-Hydroxy: 25.1 ng/mL — ABNORMAL LOW (ref 30.0–100.0)

## 2020-06-10 LAB — INSULIN, RANDOM: INSULIN: 17 u[IU]/mL (ref 2.6–24.9)

## 2020-06-10 NOTE — Telephone Encounter (Signed)
Please advise 

## 2020-06-21 ENCOUNTER — Other Ambulatory Visit (INDEPENDENT_AMBULATORY_CARE_PROVIDER_SITE_OTHER): Payer: Self-pay | Admitting: Family Medicine

## 2020-06-21 DIAGNOSIS — E559 Vitamin D deficiency, unspecified: Secondary | ICD-10-CM

## 2020-06-24 NOTE — Telephone Encounter (Signed)
Please review

## 2020-06-29 ENCOUNTER — Ambulatory Visit (INDEPENDENT_AMBULATORY_CARE_PROVIDER_SITE_OTHER): Payer: 59 | Admitting: Adult Health

## 2020-06-30 ENCOUNTER — Other Ambulatory Visit (INDEPENDENT_AMBULATORY_CARE_PROVIDER_SITE_OTHER): Payer: Self-pay | Admitting: Family Medicine

## 2020-06-30 ENCOUNTER — Ambulatory Visit (INDEPENDENT_AMBULATORY_CARE_PROVIDER_SITE_OTHER): Payer: 59 | Admitting: Adult Health

## 2020-06-30 DIAGNOSIS — E559 Vitamin D deficiency, unspecified: Secondary | ICD-10-CM

## 2020-07-05 ENCOUNTER — Other Ambulatory Visit (INDEPENDENT_AMBULATORY_CARE_PROVIDER_SITE_OTHER): Payer: Self-pay | Admitting: Adult Health

## 2020-07-05 DIAGNOSIS — Z6841 Body Mass Index (BMI) 40.0 and over, adult: Secondary | ICD-10-CM

## 2020-07-06 ENCOUNTER — Ambulatory Visit (INDEPENDENT_AMBULATORY_CARE_PROVIDER_SITE_OTHER): Payer: 59 | Admitting: Family Medicine

## 2020-07-06 ENCOUNTER — Other Ambulatory Visit: Payer: Self-pay

## 2020-07-06 ENCOUNTER — Encounter (INDEPENDENT_AMBULATORY_CARE_PROVIDER_SITE_OTHER): Payer: Self-pay | Admitting: Family Medicine

## 2020-07-06 VITALS — BP 138/83 | HR 62 | Temp 98.3°F | Ht 64.0 in | Wt 242.0 lb

## 2020-07-06 DIAGNOSIS — Z9189 Other specified personal risk factors, not elsewhere classified: Secondary | ICD-10-CM | POA: Diagnosis not present

## 2020-07-06 DIAGNOSIS — E559 Vitamin D deficiency, unspecified: Secondary | ICD-10-CM | POA: Diagnosis not present

## 2020-07-06 DIAGNOSIS — F419 Anxiety disorder, unspecified: Secondary | ICD-10-CM

## 2020-07-06 DIAGNOSIS — Z6841 Body Mass Index (BMI) 40.0 and over, adult: Secondary | ICD-10-CM

## 2020-07-06 MED ORDER — WEGOVY 1 MG/0.5ML ~~LOC~~ SOAJ: 1.0000 mg | SUBCUTANEOUS | 0 refills | Status: DC

## 2020-07-06 MED ORDER — BUPROPION HCL ER (SR) 150 MG PO TB12: 150.0000 mg | ORAL_TABLET | Freq: Every day | ORAL | 0 refills | Status: DC

## 2020-07-06 MED ORDER — WEGOVY 0.5 MG/0.5ML ~~LOC~~ SOAJ: 0.5000 mg | SUBCUTANEOUS | 0 refills | Status: DC

## 2020-07-06 MED ORDER — VITAMIN D (ERGOCALCIFEROL) 1.25 MG (50000 UNIT) PO CAPS: 50000.0000 [IU] | ORAL_CAPSULE | ORAL | 0 refills | Status: DC

## 2020-07-06 NOTE — Progress Notes (Signed)
Chief Complaint:   OBESITY Julie Mcneil is here to discuss her progress with her obesity treatment plan along with follow-up of her obesity related diagnoses. Julie Mcneil is on the Category 3 Plan and states she is following her eating plan approximately 90-95% of the time. Julie Mcneil states she is walking and/or doing aerobics for 60 minutes 4 times per week.  Today's visit was #: 10 Starting weight: 273 lbs Starting date: 01/08/2020 Today's weight: 242 lbs Today's date: 07/06/2020 Total lbs lost to date: 31 lbs Total lbs lost since last in-office visit: 0 Total weight loss percentage to date: -11.36%  Interim History: Julie Mcneil is back from vacation.  She had more sugar and alcohol while she was gone. She is ready to get back on track. She finds that she is better able to stay on track with the aid of her anti-obesity medications, Wellbutrin and Wegovy. She would like to increased the [facility] dose.   Assessment/Plan:   1. Vitamin D deficiency Current vitamin D is 25.1, tested on 06/09/2020. Not at goal. Optimal goal > 50 ng/dL. There is also evidence to support a goal of >70 ng/dL in patients with cancer and heart disease. Plan: Continue Vitamin D @50 ,000 IU every week with follow-up for routine testing of Vitamin D at least 2-3 times per year to avoid over-replacement.  - Refill Vitamin D, Ergocalciferol, (DRISDOL) 1.25 MG (50000 UNIT) CAPS capsule; Take 1 capsule (50,000 Units total) by mouth every 7 (seven) days.  Dispense: 4 capsule; Refill: 0  2. Anxiety, with emotional eating Behavior modification techniques were discussed today to help Julie Mcneil deal with her emotional/non-hunger eating behaviors.  Orders and follow up as documented in patient record.   - Refill buPROPion (WELLBUTRIN SR) 150 MG 12 hr tablet; Take 1 tablet (150 mg total) by mouth daily.  Dispense: 30 tablet; Refill: 0  3. At risk for constipation Julie Mcneil is at increased risk for constipation due to inadequate an  increase in her Wegovy dose. Kang denies hard, infrequent stools currently.  Julie Mcneil was given approximately 15 minutes of counseling today regarding prevention of constipation. She was encouraged to increase water and fiber intake.   4. Class 3 severe obesity with serious comorbidity and body mass index (BMI) of 40.0 to 44.9 in adult, unspecified obesity type Julie Mcneil)  We have reviewed the risks and benefits of Saxenda. The patient denies a personal or family history of medullary thyroid cancer or MENII. The patient denies a history of pancreatitis.  Alternative treatment options have been discussed. Patient understands that all anti-obesity medications are contraindicated in pregnancy. The potential risks and benefits of were reviewed with the patient, and alternative treatment options were discussed. All questions were answered, and the patient wishes to move forward with this medication.  - Semaglutide-Weight Management (WEGOVY) 0.5 MG/0.5ML SOAJ; Inject 0.5 mLs (0.5 mg total) into the skin once a week.  Dispense: 2 mL; Refill: 0 - Semaglutide-Weight Management (WEGOVY) 1 MG/0.5ML SOAJ; Inject 0.5 mLs (1 mg total) into the skin once a week.  Dispense: 2 mL; Refill: 0  Kerigan is currently in the action stage of change. As such, her goal is to continue with weight loss efforts. She has agreed to the Category 3 Plan.   Exercise goals: For substantial health benefits, adults should do at least 150 minutes (2 hours and 30 minutes) a week of moderate-intensity, or 75 minutes (1 hour and 15 minutes) a week of vigorous-intensity aerobic physical activity, or an equivalent combination of moderate- and  vigorous-intensity aerobic activity. Aerobic activity should be performed in episodes of at least 10 minutes, and preferably, it should be spread throughout the week.  Behavioral modification strategies: increasing lean protein intake.  Julie Mcneil has agreed to follow-up with our clinic in 3 weeks.  She was informed of the importance of frequent follow-up visits to maximize her success with intensive lifestyle modifications for her multiple health conditions.   Objective:   Blood pressure 138/83, pulse 62, temperature 98.3 F (36.8 C), temperature source Oral, height 5\' 4"  (1.626 m), weight 242 lb (109.8 kg), SpO2 99 %. Body mass index is 41.54 kg/m.  General: Cooperative, alert, well developed, in no acute distress. HEENT: Conjunctivae and lids unremarkable. Cardiovascular: Regular rhythm.  Lungs: Normal work of breathing. Neurologic: No focal deficits.   Lab Results  Component Value Date   CREATININE 0.92 06/09/2020   BUN 11 06/09/2020   NA 140 06/09/2020   K 4.5 06/09/2020   CL 102 06/09/2020   CO2 23 06/09/2020   Lab Results  Component Value Date   ALT 19 06/09/2020   AST 19 06/09/2020   ALKPHOS 116 06/09/2020   BILITOT 0.5 06/09/2020   Lab Results  Component Value Date   HGBA1C 5.7 (H) 06/09/2020   HGBA1C 5.7 (H) 01/08/2020   HGBA1C 5.5 04/09/2017   HGBA1C 5.6 09/20/2015   HGBA1C 5.8 (H) 07/28/2015   Lab Results  Component Value Date   INSULIN 17.0 06/09/2020   INSULIN 18.0 01/08/2020   Lab Results  Component Value Date   TSH 1.990 01/08/2020   Lab Results  Component Value Date   CHOL 201 (H) 06/09/2020   HDL 51 06/09/2020   LDLCALC 125 (H) 06/09/2020   TRIG 138 06/09/2020   CHOLHDL 3.7 01/08/2020   Lab Results  Component Value Date   WBC 8.1 06/09/2020   HGB 11.6 06/09/2020   HCT 37.6 06/09/2020   MCV 84 06/09/2020   PLT 353 06/09/2020   Lab Results  Component Value Date   IRON 37 01/08/2020   TIBC 327 01/08/2020   FERRITIN 26 01/08/2020   Attestation Statements:   Reviewed by clinician on day of visit: allergies, medications, problem list, medical history, surgical history, family history, social history, and previous encounter notes.  I, 03/09/2020, CMA, am acting as transcriptionist for Insurance claims handler, DO  I have reviewed the  above documentation for accuracy and completeness, and I agree with the above. Helane Rima, DO

## 2020-07-13 ENCOUNTER — Other Ambulatory Visit (INDEPENDENT_AMBULATORY_CARE_PROVIDER_SITE_OTHER): Payer: Self-pay | Admitting: Family Medicine

## 2020-07-13 DIAGNOSIS — F419 Anxiety disorder, unspecified: Secondary | ICD-10-CM

## 2020-07-26 ENCOUNTER — Other Ambulatory Visit (INDEPENDENT_AMBULATORY_CARE_PROVIDER_SITE_OTHER): Payer: Self-pay | Admitting: Family Medicine

## 2020-07-26 DIAGNOSIS — E559 Vitamin D deficiency, unspecified: Secondary | ICD-10-CM

## 2020-07-28 ENCOUNTER — Ambulatory Visit (INDEPENDENT_AMBULATORY_CARE_PROVIDER_SITE_OTHER): Payer: 59 | Admitting: Family Medicine

## 2020-07-28 ENCOUNTER — Encounter (INDEPENDENT_AMBULATORY_CARE_PROVIDER_SITE_OTHER): Payer: Self-pay | Admitting: Family Medicine

## 2020-07-28 ENCOUNTER — Other Ambulatory Visit: Payer: Self-pay

## 2020-07-28 VITALS — BP 105/72 | HR 79 | Temp 97.9°F | Ht 64.0 in | Wt 239.0 lb

## 2020-07-28 DIAGNOSIS — R7303 Prediabetes: Secondary | ICD-10-CM

## 2020-07-28 DIAGNOSIS — E66813 Obesity, class 3: Secondary | ICD-10-CM

## 2020-07-28 DIAGNOSIS — E559 Vitamin D deficiency, unspecified: Secondary | ICD-10-CM | POA: Diagnosis not present

## 2020-07-28 DIAGNOSIS — Z6841 Body Mass Index (BMI) 40.0 and over, adult: Secondary | ICD-10-CM

## 2020-07-29 NOTE — Progress Notes (Signed)
Chief Complaint:   OBESITY Julie Mcneil is here to discuss her progress with her obesity treatment plan along with follow-up of her obesity related diagnoses. Julie Mcneil is on the Category 3 Plan and states she is following her eating plan approximately 75% of the time. Julie Mcneil states she is doing cardio for 45 minutes 3- times per week.  Today's visit was #: 11 Starting weight: 273 lbs Starting date: 01/08/2020 Today's weight: 239 lbs Today's date: 07/28/2020 Total lbs lost to date: 34 lbs Total lbs lost since last in-office visit: 3 lbs Total weight loss percentage to date: -12.45%  Interim History: Julie Mcneil endorses polyphagia. She starts Wegovy 0.5 mg tomorrow.  She has joined a gym. Her NSV is that she has gone from a size 20 to a size 16.  Assessment/Plan:   1. Vitamin D deficiency Current vitamin D is 25.1, tested on 06/09/2020. Not at goal. Optimal goal > 50 ng/dL. There is also evidence to support a goal of >70 ng/dL in patients with cancer and heart disease. Plan: Continue Vitamin D @50 ,000 IU every week with follow-up for routine testing of Vitamin D at least 2-3 times per year to avoid over-replacement.  2. Prediabetes Not at goal. Goal is HgbA1c < 5.7 and insulin level closer to 5. She will continue to focus on protein-rich, low simple carbohydrate foods. We reviewed the importance of hydration, regular exercise for stress reduction, and restorative sleep.   Lab Results  Component Value Date   HGBA1C 5.7 (H) 06/09/2020   Lab Results  Component Value Date   INSULIN 17.0 06/09/2020   INSULIN 18.0 01/08/2020   3. Class 3 severe obesity with serious comorbidity and body mass index (BMI) of 40.0 to 44.9 in adult, unspecified obesity type [facility])  Julie Mcneil is currently in the action stage of change. As such, her goal is to continue with weight loss efforts. She has agreed to the Category 3 Plan.   Exercise goals: For substantial health benefits, adults should do at least  150 minutes (2 hours and 30 minutes) a week of moderate-intensity, or 75 minutes (1 hour and 15 minutes) a week of vigorous-intensity aerobic physical activity, or an equivalent combination of moderate- and vigorous-intensity aerobic activity. Aerobic activity should be performed in episodes of at least 10 minutes, and preferably, it should be spread throughout the week.  Behavioral modification strategies: increasing lean protein intake.  Julie Mcneil has agreed to follow-up with our clinic in 2-3 weeks. She was informed of the importance of frequent follow-up visits to maximize her success with intensive lifestyle modifications for her multiple health conditions.   Objective:   Blood pressure 105/72, pulse 79, temperature 97.9 F (36.6 C), temperature source Oral, height 5\' 4"  (1.626 m), weight 239 lb (108.4 kg), SpO2 97 %. Body mass index is 41.02 kg/m.  General: Cooperative, alert, well developed, in no acute distress. HEENT: Conjunctivae and lids unremarkable. Cardiovascular: Regular rhythm.  Lungs: Normal work of breathing. Neurologic: No focal deficits.   Lab Results  Component Value Date   CREATININE 0.92 06/09/2020   BUN 11 06/09/2020   NA 140 06/09/2020   K 4.5 06/09/2020   CL 102 06/09/2020   CO2 23 06/09/2020   Lab Results  Component Value Date   ALT 19 06/09/2020   AST 19 06/09/2020   ALKPHOS 116 06/09/2020   BILITOT 0.5 06/09/2020   Lab Results  Component Value Date   HGBA1C 5.7 (H) 06/09/2020   HGBA1C 5.7 (H) 01/08/2020   HGBA1C  5.5 04/09/2017   HGBA1C 5.6 09/20/2015   HGBA1C 5.8 (H) 07/28/2015   Lab Results  Component Value Date   INSULIN 17.0 06/09/2020   INSULIN 18.0 01/08/2020   Lab Results  Component Value Date   TSH 1.990 01/08/2020   Lab Results  Component Value Date   CHOL 201 (H) 06/09/2020   HDL 51 06/09/2020   LDLCALC 125 (H) 06/09/2020   TRIG 138 06/09/2020   CHOLHDL 3.7 01/08/2020   Lab Results  Component Value Date   WBC 8.1  06/09/2020   HGB 11.6 06/09/2020   HCT 37.6 06/09/2020   MCV 84 06/09/2020   PLT 353 06/09/2020   Lab Results  Component Value Date   IRON 37 01/08/2020   TIBC 327 01/08/2020   FERRITIN 26 01/08/2020   Attestation Statements:   Reviewed by clinician on day of visit: allergies, medications, problem list, medical history, surgical history, family history, social history, and previous encounter notes.  Time spent on visit including pre-visit chart review and post-visit care and charting was 30 minutes.   I, Insurance claims handler, CMA, am acting as transcriptionist for Helane Rima, DO  I have reviewed the above documentation for accuracy and completeness, and I agree with the above. Helane Rima, DO

## 2020-08-14 ENCOUNTER — Telehealth: Payer: 59 | Admitting: Orthopedic Surgery

## 2020-08-14 DIAGNOSIS — J029 Acute pharyngitis, unspecified: Secondary | ICD-10-CM | POA: Diagnosis not present

## 2020-08-14 MED ORDER — PENICILLIN V POTASSIUM 500 MG PO TABS: 500.0000 mg | ORAL_TABLET | Freq: Two times a day (BID) | ORAL | 0 refills | Status: DC

## 2020-08-14 NOTE — Progress Notes (Signed)
We are sorry that you are not feeling well.  Here is how we plan to help!  Based on what you have shared with me it is likely that you have strep pharyngitis.  Strep pharyngitis is inflammation and infection in the back of the throat.  This is an infection cause by bacteria and is treated with antibiotics.  I have prescribed Penicillin V 500 mg twice a day for 10 days. For throat pain, we recommend over the counter oral pain relief medications such as acetaminophen or aspirin, or anti-inflammatory medications such as ibuprofen or naproxen sodium. Topical treatments such as oral throat lozenges or sprays may be used as needed. Strep infections are not as easily transmitted as other respiratory infections, however we still recommend that you avoid close contact with loved ones, especially the very young and elderly.  Remember to wash your hands thoroughly throughout the day as this is the number one way to prevent the spread of infection and wipe down door knobs and counters with disinfectant.   Home Care:  Only take medications as instructed by your medical team.  Complete the entire course of an antibiotic.  Do not take these medications with alcohol.  A steam or ultrasonic humidifier can help congestion.  You can place a towel over your head and breathe in the steam from hot water coming from a faucet.  Avoid close contacts especially the very young and the elderly.  Cover your mouth when you cough or sneeze.  Always remember to wash your hands.  Get Help Right Away If:  You develop worsening fever or sinus pain.  You develop a severe head ache or visual changes.  Your symptoms persist after you have completed your treatment plan.  Make sure you  Understand these instructions.  Will watch your condition.  Will get help right away if you are not doing well or get worse.  Your e-visit answers were reviewed by a board certified advanced clinical practitioner to complete your  personal care plan.  Depending on the condition, your plan could have included both over the counter or prescription medications.  If there is a problem please reply  once you have received a response from your provider.  Your safety is important to us.  If you have drug allergies check your prescription carefully.    You can use MyChart to ask questions about today's visit, request a non-urgent call back, or ask for a work or school excuse for 24 hours related to this e-Visit. If it has been greater than 24 hours you will need to follow up with your provider, or enter a new e-Visit to address those concerns.  You will get an e-mail in the next two days asking about your experience.  I hope that your e-visit has been valuable and will speed your recovery. Thank you for using e-visits.   Greater than 5 minutes, yet less than 10 minutes of time have been spent researching, coordinating and implementing care for this patient today.   

## 2020-08-18 ENCOUNTER — Ambulatory Visit (INDEPENDENT_AMBULATORY_CARE_PROVIDER_SITE_OTHER): Payer: 59 | Admitting: Bariatrics

## 2020-08-18 ENCOUNTER — Other Ambulatory Visit: Payer: Self-pay

## 2020-08-18 ENCOUNTER — Encounter (INDEPENDENT_AMBULATORY_CARE_PROVIDER_SITE_OTHER): Payer: Self-pay | Admitting: Bariatrics

## 2020-08-18 ENCOUNTER — Ambulatory Visit (INDEPENDENT_AMBULATORY_CARE_PROVIDER_SITE_OTHER): Payer: 59 | Admitting: Family Medicine

## 2020-08-18 VITALS — BP 131/84 | HR 72 | Temp 98.4°F | Ht 64.0 in | Wt 236.0 lb

## 2020-08-18 DIAGNOSIS — R7303 Prediabetes: Secondary | ICD-10-CM

## 2020-08-18 DIAGNOSIS — E559 Vitamin D deficiency, unspecified: Secondary | ICD-10-CM

## 2020-08-18 DIAGNOSIS — Z6841 Body Mass Index (BMI) 40.0 and over, adult: Secondary | ICD-10-CM

## 2020-08-18 NOTE — Progress Notes (Signed)
Chief Complaint:   OBESITY Julie Mcneil is here to discuss her progress with her obesity treatment plan along with follow-up of her obesity related diagnoses. Julie Mcneil is on the Category 3 Plan and states she is following her eating plan approximately 50% of the time. Julie Mcneil states she is exercising 0 minutes 0 times per week.  Today's visit was #: 12 Starting weight: 273 lbs Starting date: 01/08/2020 Today's weight: 236 lbs Today's date: 08/18/2020 Total lbs lost to date: 37 Total lbs lost since last in-office visit: 3  Interim History: Julie Mcneil is down 3 lbs and doing well overall. She is taking Wegovy. She states she has eaten out more.  Subjective:   Prediabetes. Julie Mcneil has a diagnosis of prediabetes based on her elevated HgA1c and was informed this puts her at greater risk of developing diabetes. She continues to work on diet and exercise to decrease her risk of diabetes. She denies nausea or hypoglycemia. No polyphagia.  Lab Results  Component Value Date   HGBA1C 5.7 (H) 06/09/2020   Lab Results  Component Value Date   INSULIN 17.0 06/09/2020   INSULIN 18.0 01/08/2020   Vitamin D deficiency. Julie Mcneil is taking Vitamin D supplementation.    Ref. Range 06/09/2020 12:33  Vitamin D, 25-Hydroxy Latest Ref Range: 30.0 - 100.0 ng/mL 25.1 (L)   Assessment/Plan:   Prediabetes. Julie Mcneil will continue to work on weight loss, exercise, increasing healthy fats and protein, and decreasing simple carbohydrates to help decrease the risk of diabetes.   Vitamin D deficiency. Low Vitamin D level contributes to fatigue and are associated with obesity, breast, and colon cancer. She agrees to continue to take Vitamin D as directed and will follow-up for routine testing of Vitamin D, at least 2-3 times per year to avoid over-replacement.  Class 3 severe obesity with serious comorbidity and body mass index (BMI) of 40.0 to 44.9 in adult, unspecified obesity type  (HCC).  Julie Mcneil is currently in the action stage of change. As such, her goal is to continue with weight loss efforts. She has agreed to the Category 3 Plan.   She will work on meal planning, intentional eating, and will continue Wegovy.  Exercise goals: Julie Mcneil get back to walking regularly and begin resistance.  Behavioral modification strategies: increasing lean protein intake, decreasing simple carbohydrates, increasing vegetables, increasing water intake, better snacking choices and planning for success.  Julie Mcneil has agreed to follow-up with our clinic in 2-3 weeks. She was informed of the importance of frequent follow-up visits to maximize her success with intensive lifestyle modifications for her multiple health conditions.   Objective:   Blood pressure 131/84, pulse 72, temperature 98.4 F (36.9 C), height 5\' 4"  (1.626 m), weight 236 lb (107 kg), last menstrual period 08/11/2020, SpO2 99 %. Body mass index is 40.51 kg/m.  General: Cooperative, alert, well developed, in no acute distress. HEENT: Conjunctivae and lids unremarkable. Cardiovascular: Regular rhythm.  Lungs: Normal work of breathing. Neurologic: No focal deficits.   Lab Results  Component Value Date   CREATININE 0.92 06/09/2020   BUN 11 06/09/2020   NA 140 06/09/2020   K 4.5 06/09/2020   CL 102 06/09/2020   CO2 23 06/09/2020   Lab Results  Component Value Date   ALT 19 06/09/2020   AST 19 06/09/2020   ALKPHOS 116 06/09/2020   BILITOT 0.5 06/09/2020   Lab Results  Component Value Date   HGBA1C 5.7 (H) 06/09/2020   HGBA1C 5.7 (H) 01/08/2020  HGBA1C 5.5 04/09/2017   HGBA1C 5.6 09/20/2015   HGBA1C 5.8 (H) 07/28/2015   Lab Results  Component Value Date   INSULIN 17.0 06/09/2020   INSULIN 18.0 01/08/2020   Lab Results  Component Value Date   TSH 1.990 01/08/2020   Lab Results  Component Value Date   CHOL 201 (H) 06/09/2020   HDL 51 06/09/2020   LDLCALC 125 (H) 06/09/2020   TRIG 138  06/09/2020   CHOLHDL 3.7 01/08/2020   Lab Results  Component Value Date   WBC 8.1 06/09/2020   HGB 11.6 06/09/2020   HCT 37.6 06/09/2020   MCV 84 06/09/2020   PLT 353 06/09/2020   Lab Results  Component Value Date   IRON 37 01/08/2020   TIBC 327 01/08/2020   FERRITIN 26 01/08/2020   Attestation Statements:   Reviewed by clinician on day of visit: allergies, medications, problem list, medical history, surgical history, family history, social history, and previous encounter notes.  Time spent on visit including pre-visit chart review and post-visit charting and care was 20 minutes.   Fernanda Drum, am acting as Energy manager for [employer], DO   I have reviewed the above documentation for accuracy and completeness, and I agree with the above. Corinna Capra, DO

## 2020-08-19 ENCOUNTER — Encounter (INDEPENDENT_AMBULATORY_CARE_PROVIDER_SITE_OTHER): Payer: Self-pay | Admitting: Bariatrics

## 2020-09-08 ENCOUNTER — Encounter (INDEPENDENT_AMBULATORY_CARE_PROVIDER_SITE_OTHER): Payer: Self-pay | Admitting: Family Medicine

## 2020-09-08 ENCOUNTER — Other Ambulatory Visit: Payer: Self-pay

## 2020-09-08 ENCOUNTER — Ambulatory Visit (INDEPENDENT_AMBULATORY_CARE_PROVIDER_SITE_OTHER): Payer: 59 | Admitting: Family Medicine

## 2020-09-08 VITALS — BP 128/86 | HR 75 | Temp 98.1°F | Ht 64.0 in | Wt 234.0 lb

## 2020-09-08 DIAGNOSIS — Z6841 Body Mass Index (BMI) 40.0 and over, adult: Secondary | ICD-10-CM

## 2020-09-08 DIAGNOSIS — E559 Vitamin D deficiency, unspecified: Secondary | ICD-10-CM | POA: Diagnosis not present

## 2020-09-08 DIAGNOSIS — F419 Anxiety disorder, unspecified: Secondary | ICD-10-CM | POA: Diagnosis not present

## 2020-09-08 DIAGNOSIS — Z9189 Other specified personal risk factors, not elsewhere classified: Secondary | ICD-10-CM

## 2020-09-09 ENCOUNTER — Encounter (INDEPENDENT_AMBULATORY_CARE_PROVIDER_SITE_OTHER): Payer: Self-pay | Admitting: Family Medicine

## 2020-09-09 ENCOUNTER — Telehealth: Payer: 59 | Admitting: Family

## 2020-09-09 DIAGNOSIS — J029 Acute pharyngitis, unspecified: Secondary | ICD-10-CM

## 2020-09-09 MED ORDER — WEGOVY 1.7 MG/0.75ML ~~LOC~~ SOAJ: 1.7000 mg | SUBCUTANEOUS | 0 refills | Status: DC

## 2020-09-09 MED ORDER — BUPROPION HCL ER (SR) 150 MG PO TB12: 150.0000 mg | ORAL_TABLET | Freq: Every day | ORAL | 0 refills | Status: DC

## 2020-09-09 MED ORDER — VITAMIN D (ERGOCALCIFEROL) 1.25 MG (50000 UNIT) PO CAPS: 50000.0000 [IU] | ORAL_CAPSULE | ORAL | 0 refills | Status: DC

## 2020-09-09 MED ORDER — WEGOVY 2.4 MG/0.75ML ~~LOC~~ SOAJ: 2.4000 mg | SUBCUTANEOUS | 0 refills | Status: DC

## 2020-09-09 MED ORDER — AZITHROMYCIN 200 MG/5ML PO SUSR: ORAL | 0 refills | Status: DC

## 2020-09-09 NOTE — Progress Notes (Signed)
Chief Complaint:   OBESITY Julie Mcneil is here to discuss her progress with her obesity treatment plan along with follow-up of her obesity related diagnoses.   Today's visit was #: 13 Starting weight: 273 lbs Starting date: 01/08/2020 Today's weight: 234 lbs Today's date: 09/08/2020 Total lbs lost to date: 39 lbs Body mass index is 40.17 kg/m.  Total weight loss percentage to date: -14.29%  Nutrition Plan: the Category 3 Plan for 50% of the time.  Anti-obesity medications: Wegovy. Reported side effects: None. Hunger is well controlled controlled. Cravings are well controlled controlled.  Activity: Increased walking for 3-4 times per week.  Assessment/Plan:   1. Vitamin D deficiency Current vitamin D is 25.1, tested on 06/09/2020. Not at goal. Optimal goal > 50 ng/dL.   Plan:  [x]   Continue Vitamin D @50 ,000 IU every week. []   Continue home supplement daily. [x]   Follow-up for routine testing of Vitamin D at least 2-3 times per year to avoid over-replacement. []   Monitored by PCP.  - Refill Vitamin D, Ergocalciferol, (DRISDOL) 1.25 MG (50000 UNIT) CAPS capsule; Take 1 capsule (50,000 Units total) by mouth every 7 (seven) days.  Dispense: 4 capsule; Refill: 0  2. Anxiety, with emotional eating The current medical regimen is effective;  continue present plan and medications. Trinka is taking Wellbutrin 150 mg and Celexa 20 mg daily for anxiety. Discussed cues and consequences, how thoughts affect eating, model of thoughts, feelings, and behaviors, and strategies for change by focusing on the cue. Discussed cognitive distortions, coping thoughts, and how to change your thoughts.  - Refill buPROPion (WELLBUTRIN SR) 150 MG 12 hr tablet; Take 1 tablet (150 mg total) by mouth daily.  Dispense: 90 tablet; Refill: 0  3. At risk for constipation Adylene is at risk for constipation due to increasing her Wegovy dose.  She was given approximately 15 minutes of counseling today  regarding prevention of constipation. She was encouraged to increase water and fiber intake.   Counseling Getting to Good Bowel Health: Your goal is to have one soft bowel movement each day. Drink at least 8 glasses of water each day. Eat plenty of fiber (goal is over 25 grams each day). It is best to get most of your fiber from dietary sources which includes leafy green vegetables, fresh fruit, and whole grains. You may need to add fiber with the help of OTC fiber supplements. These include Metamucil, Citrucel, and Benefiber. If you are still having trouble, try adding Miralax or Magnesium Citrate. If all of these changes do not work, contact me.  4. Class 3 severe obesity with serious comorbidity and body mass index (BMI) of 40.0 to 44.9 in adult, unspecified obesity type (HCC)  - Refill Semaglutide-Weight Management (WEGOVY) 2.4 MG/0.75ML SOAJ; Inject 2.4 mg into the skin once a week.  Dispense: 3 mL; Refill: 0  Course: [name] is currently in the action stage of change. As such, her goal is to continue with weight loss efforts.   Nutrition goals: She has agreed to the Category 3 Plan.   Exercise goals: For substantial health benefits, adults should do at least 150 minutes (2 hours and 30 minutes) a week of moderate-intensity, or 75 minutes (1 hour and 15 minutes) a week of vigorous-intensity aerobic physical activity, or an equivalent combination of moderate- and vigorous-intensity aerobic activity. Aerobic activity should be performed in episodes of at least 10 minutes, and preferably, it should be spread throughout the week.  Behavioral modification strategies: increasing lean protein  intake, decreasing simple carbohydrates, increasing vegetables and increasing water intake.  Sharleen has agreed to follow-up with our clinic in 4 weeks. She was informed of the importance of frequent follow-up visits to maximize her success with intensive lifestyle modifications for her multiple health  conditions.   Objective:   Blood pressure 128/86, pulse 75, temperature 98.1 F (36.7 C), temperature source Oral, height 5\' 4"  (1.626 m), weight 234 lb (106.1 kg), last menstrual period 08/11/2020, SpO2 100 %. Body mass index is 40.17 kg/m.  General: Cooperative, alert, well developed, in no acute distress. HEENT: Conjunctivae and lids unremarkable. Cardiovascular: Regular rhythm.  Lungs: Normal work of breathing. Neurologic: No focal deficits.   Lab Results  Component Value Date   CREATININE 0.92 06/09/2020   BUN 11 06/09/2020   NA 140 06/09/2020   K 4.5 06/09/2020   CL 102 06/09/2020   CO2 23 06/09/2020   Lab Results  Component Value Date   ALT 19 06/09/2020   AST 19 06/09/2020   ALKPHOS 116 06/09/2020   BILITOT 0.5 06/09/2020   Lab Results  Component Value Date   HGBA1C 5.7 (H) 06/09/2020   HGBA1C 5.7 (H) 01/08/2020   HGBA1C 5.5 04/09/2017   HGBA1C 5.6 09/20/2015   HGBA1C 5.8 (H) 07/28/2015   Lab Results  Component Value Date   INSULIN 17.0 06/09/2020   INSULIN 18.0 01/08/2020   Lab Results  Component Value Date   TSH 1.990 01/08/2020   Lab Results  Component Value Date   CHOL 201 (H) 06/09/2020   HDL 51 06/09/2020   LDLCALC 125 (H) 06/09/2020   TRIG 138 06/09/2020   CHOLHDL 3.7 01/08/2020   Lab Results  Component Value Date   WBC 8.1 06/09/2020   HGB 11.6 06/09/2020   HCT 37.6 06/09/2020   MCV 84 06/09/2020   PLT 353 06/09/2020   Lab Results  Component Value Date   IRON 37 01/08/2020   TIBC 327 01/08/2020   FERRITIN 26 01/08/2020   Attestation Statements:   Reviewed by clinician on day of visit: allergies, medications, problem list, medical history, surgical history, family history, social history, and previous encounter notes.  I, 03/09/2020, CMA, am acting as transcriptionist for Insurance claims handler, DO  I have reviewed the above documentation for accuracy and completeness, and I agree with the above. Helane Rima, DO

## 2020-09-09 NOTE — Progress Notes (Signed)
We are sorry that you are not feeling well.  Here is how we plan to help!  Based on what you have shared with me it is likely that you have strep pharyngitis.  Strep pharyngitis is inflammation and infection in the back of the throat.  This is an infection cause by bacteria and is treated with antibiotics.  I have prescribed Azithromycin in a liquid form. For throat pain, we recommend over the counter oral pain relief medications such as acetaminophen or aspirin, or anti-inflammatory medications such as ibuprofen or naproxen sodium. Topical treatments such as oral throat lozenges or sprays may be used as needed. Strep infections are not as easily transmitted as other respiratory infections, however we still recommend that you avoid close contact with loved ones, especially the very young and elderly.  Remember to wash your hands thoroughly throughout the day as this is the number one way to prevent the spread of infection and wipe down door knobs and counters with disinfectant.   Home Care:  Only take medications as instructed by your medical team.  Complete the entire course of an antibiotic.  Do not take these medications with alcohol.  A steam or ultrasonic humidifier can help congestion.  You can place a towel over your head and breathe in the steam from hot water coming from a faucet.  Avoid close contacts especially the very young and the elderly.  Cover your mouth when you cough or sneeze.  Always remember to wash your hands.  Get Help Right Away If:  You develop worsening fever or sinus pain.  You develop a severe head ache or visual changes.  Your symptoms persist after you have completed your treatment plan.  Make sure you  Understand these instructions.  Will watch your condition.  Will get help right away if you are not doing well or get worse.  Your e-visit answers were reviewed by a board certified advanced clinical practitioner to complete your personal care plan.   Depending on the condition, your plan could have included both over the counter or prescription medications.  If there is a problem please reply  once you have received a response from your provider.  Your safety is important to [place].  If you have drug allergies check your prescription carefully.    You can use MyChart to ask questions about today's visit, request a non-urgent call back, or ask for a work or school excuse for 24 hours related to this e-Visit. If it has been greater than 24 hours you will need to follow up with your provider, or enter a new e-Visit to address those concerns.  You will get an e-mail in the next two days asking about your experience.  I hope that your e-visit has been valuable and will speed your recovery. Thank you for using e-visits.  Greater than 5 minutes, yet less than 10 minutes of time have been spent researching, coordinating, and implementing care for this patient.

## 2020-09-09 NOTE — Telephone Encounter (Signed)
Dr Wallace pt °

## 2020-09-10 MED ORDER — CITALOPRAM HYDROBROMIDE 20 MG PO TABS: 20.0000 mg | ORAL_TABLET | Freq: Every day | ORAL | 0 refills | Status: DC

## 2020-10-07 ENCOUNTER — Ambulatory Visit (INDEPENDENT_AMBULATORY_CARE_PROVIDER_SITE_OTHER): Payer: 59 | Admitting: Family Medicine

## 2020-10-07 ENCOUNTER — Encounter (INDEPENDENT_AMBULATORY_CARE_PROVIDER_SITE_OTHER): Payer: Self-pay | Admitting: Family Medicine

## 2020-10-07 ENCOUNTER — Telehealth: Payer: 59 | Admitting: Family

## 2020-10-07 ENCOUNTER — Other Ambulatory Visit: Payer: Self-pay

## 2020-10-07 VITALS — BP 106/75 | HR 82 | Temp 97.8°F | Ht 64.0 in | Wt 234.0 lb

## 2020-10-07 DIAGNOSIS — E66813 Obesity, class 3: Secondary | ICD-10-CM

## 2020-10-07 DIAGNOSIS — E559 Vitamin D deficiency, unspecified: Secondary | ICD-10-CM

## 2020-10-07 DIAGNOSIS — L304 Erythema intertrigo: Secondary | ICD-10-CM

## 2020-10-07 DIAGNOSIS — R632 Polyphagia: Secondary | ICD-10-CM | POA: Diagnosis not present

## 2020-10-07 DIAGNOSIS — Z9189 Other specified personal risk factors, not elsewhere classified: Secondary | ICD-10-CM | POA: Diagnosis not present

## 2020-10-07 DIAGNOSIS — Z6841 Body Mass Index (BMI) 40.0 and over, adult: Secondary | ICD-10-CM

## 2020-10-07 DIAGNOSIS — F419 Anxiety disorder, unspecified: Secondary | ICD-10-CM

## 2020-10-07 DIAGNOSIS — J019 Acute sinusitis, unspecified: Secondary | ICD-10-CM

## 2020-10-07 DIAGNOSIS — K5909 Other constipation: Secondary | ICD-10-CM

## 2020-10-07 MED ORDER — AMOXICILLIN-POT CLAVULANATE 875-125 MG PO TABS: 1.0000 | ORAL_TABLET | Freq: Two times a day (BID) | ORAL | 0 refills | Status: AC

## 2020-10-07 MED ORDER — NYSTATIN 100000 UNIT/GM EX OINT: 1.0000 "application " | TOPICAL_OINTMENT | Freq: Two times a day (BID) | CUTANEOUS | 0 refills | Status: DC

## 2020-10-07 MED ORDER — CITALOPRAM HYDROBROMIDE 20 MG PO TABS: 20.0000 mg | ORAL_TABLET | Freq: Every day | ORAL | 3 refills | Status: DC

## 2020-10-07 MED ORDER — BUPROPION HCL ER (SR) 150 MG PO TB12: 150.0000 mg | ORAL_TABLET | Freq: Every day | ORAL | 3 refills | Status: DC

## 2020-10-07 MED ORDER — WEGOVY 2.4 MG/0.75ML ~~LOC~~ SOAJ: 2.4000 mg | SUBCUTANEOUS | 0 refills | Status: DC

## 2020-10-07 MED ORDER — LINACLOTIDE 72 MCG PO CAPS: 72.0000 ug | ORAL_CAPSULE | Freq: Every day | ORAL | 0 refills | Status: DC | PRN

## 2020-10-07 MED ORDER — VITAMIN D (ERGOCALCIFEROL) 1.25 MG (50000 UNIT) PO CAPS: 50000.0000 [IU] | ORAL_CAPSULE | ORAL | 0 refills | Status: DC

## 2020-10-07 NOTE — Progress Notes (Signed)
Chief Complaint:   OBESITY Julie Mcneil is here to discuss her progress with her obesity treatment plan along with follow-up of her obesity related diagnoses.   Today's visit was #: 14 Starting weight: 273 lbs Starting date: 01/08/2020 Today's weight: 234 lbs Today's date: 10/07/2020 Total lbs lost to date: 39 lbs Body mass index is 40.17 kg/m.  Total weight loss percentage to date: -14.29%  Interim History: Julie Mcneil says she has been sick over the past few weeks.  She also says she has been having some abdominopelvic bloating. Nutrition Plan: the Category 3 Plan for 30% of the time.  Activity: Julie Mcneil says she is doing more walking.  Assessment/Plan:   1. Polyphagia Improved with [id number]. She will continue to focus on protein-rich, low simple carbohydrate foods. We reviewed the importance of hydration, regular exercise for stress reduction, and restorative sleep.  Plan:  She has Wegovy 2.4 mg at home, and will start it next week.  - Increase Semaglutide-Weight Management (WEGOVY) 2.4 MG/0.75ML SOAJ; Inject 2.4 mg into the skin once a week.  Dispense: 3 mL; Refill: 0  2. Other constipation Chronic, worsened by PYPPJK. She is drinking around 50-60 ounces of water daily currently.  She has already begun a bowel regimen.  Plan:  Will start her on Linzess, as per below. Risk versus benefits of medication reviewed. The patient understands monitoring parameters and red flags. Increase water and fiber intake.   - Start linaclotide (LINZESS) 72 MCG capsule; Take 1 capsule (72 mcg total) by mouth daily as needed.  Dispense: 30 capsule; Refill: 0  3. Vitamin D deficiency Not at goal. Current vitamin D is 25.1, tested on 06/09/2020. Optimal goal > 50 ng/dL.   Plan:  [x]   Continue Vitamin D @50 ,000 IU every week. []   Continue home supplement daily. [x]   Follow-up for routine testing of Vitamin D at least 2-3 times per year to avoid over-replacement.  - Refill Vitamin D,  Ergocalciferol, (DRISDOL) 1.25 MG (50000 UNIT) CAPS capsule; Take 1 capsule (50,000 Units total) by mouth every 7 (seven) days.  Dispense: 4 capsule; Refill: 0  4. Intertrigo New. Right pannus. History of the same. No open lesions or signs of cellulitis. Start nystatin ointment, as per below.  - Start nystatin ointment (MYCOSTATIN); Apply 1 application topically 2 (two) times daily.  Dispense: 30 g; Refill: 0  5. Anxiety, with emotional eating Improved. Julie Mcneil is taking Celexa 20 mg daily and Wellbutrin 150 mg daily.  - Refill citalopram (CELEXA) 20 MG tablet; Take 1 tablet (20 mg total) by mouth daily.  Dispense: 30 tablet; Refill: 3 - Refill buPROPion (WELLBUTRIN SR) 150 MG 12 hr tablet; Take 1 tablet (150 mg total) by mouth daily.  Dispense: 30 tablet; Refill: 3  6. At risk for diarrhea Julie Mcneil was given approximately 8 minutes of diarrhea prevention counseling today. She is 39 y.o. female and has risk factors for diarrhea including medications and changes in diet. We discussed intensive lifestyle modifications today with an emphasis on specific weight loss instructions including dietary strategies.   7. Class 3 severe obesity with serious comorbidity and body mass index (BMI) of 40.0 to 44.9 in adult, unspecified obesity type (HCC)  Course: Julie Mcneil is currently in the action stage of change. As such, her goal is to continue with weight loss efforts.   Nutrition goals: She has agreed to the Category 3 Plan.   Exercise goals: For substantial health benefits, adults should do at least 150 minutes (2 hours and 30  minutes) a week of moderate-intensity, or 75 minutes (1 hour and 15 minutes) a week of vigorous-intensity aerobic physical activity, or an equivalent combination of moderate- and vigorous-intensity aerobic activity. Aerobic activity should be performed in episodes of at least 10 minutes, and preferably, it should be spread throughout the week.  Behavioral modification  strategies: increasing lean protein intake, decreasing simple carbohydrates, increasing vegetables, increasing water intake and decreasing liquid calories.  [name] has agreed to follow-up with our clinic in 4 weeks. She was informed of the importance of frequent follow-up visits to maximize her success with intensive lifestyle modifications for her multiple health conditions.   Objective:   Blood pressure 106/75, pulse 82, temperature 97.8 F (36.6 C), temperature source Oral, height 5\' 4"  (1.626 m), weight 234 lb (106.1 kg), SpO2 99 %. Body mass index is 40.17 kg/m.  General: Cooperative, alert, well developed, in no acute distress. HEENT: Conjunctivae and lids unremarkable. Cardiovascular: Regular rhythm.  Lungs: Normal work of breathing. Neurologic: No focal deficits.   Lab Results  Component Value Date   CREATININE 0.92 06/09/2020   BUN 11 06/09/2020   NA 140 06/09/2020   K 4.5 06/09/2020   CL 102 06/09/2020   CO2 23 06/09/2020   Lab Results  Component Value Date   ALT 19 06/09/2020   AST 19 06/09/2020   ALKPHOS 116 06/09/2020   BILITOT 0.5 06/09/2020   Lab Results  Component Value Date   HGBA1C 5.7 (H) 06/09/2020   HGBA1C 5.7 (H) 01/08/2020   HGBA1C 5.5 04/09/2017   HGBA1C 5.6 09/20/2015   HGBA1C 5.8 (H) 07/28/2015   Lab Results  Component Value Date   INSULIN 17.0 06/09/2020   INSULIN 18.0 01/08/2020   Lab Results  Component Value Date   TSH 1.990 01/08/2020   Lab Results  Component Value Date   CHOL 201 (H) 06/09/2020   HDL 51 06/09/2020   LDLCALC 125 (H) 06/09/2020   TRIG 138 06/09/2020   CHOLHDL 3.7 01/08/2020   Lab Results  Component Value Date   WBC 8.1 06/09/2020   HGB 11.6 06/09/2020   HCT 37.6 06/09/2020   MCV 84 06/09/2020   PLT 353 06/09/2020   Lab Results  Component Value Date   IRON 37 01/08/2020   TIBC 327 01/08/2020   FERRITIN 26 01/08/2020   Attestation Statements:   Reviewed by clinician on day of visit: allergies,  medications, problem list, medical history, surgical history, family history, social history, and previous encounter notes.  I, 03/09/2020, CMA, am acting as transcriptionist for Insurance claims handler, DO  I have reviewed the above documentation for accuracy and completeness, and I agree with the above. Helane Rima, DO

## 2020-10-07 NOTE — Progress Notes (Signed)
We are sorry that you are not feeling well.  Here is how we plan to help! ° °Based on what you have shared with me it looks like you have sinusitis.  Sinusitis is inflammation and infection in the sinus cavities of the head.  Based on your presentation I believe you most likely have Acute Bacterial Sinusitis.  This is an infection caused by bacteria and is treated with antibiotics. I have prescribed Augmentin 875mg/125mg one tablet twice daily with food, for 7 days. You may use an oral decongestant such as Mucinex D or if you have glaucoma or high blood pressure use plain Mucinex. Saline nasal spray help and can safely be used as often as needed for congestion.  If you develop worsening sinus pain, fever or notice severe headache and vision changes, or if symptoms are not better after completion of antibiotic, please schedule an appointment with a health care provider.   ° °Sinus infections are not as easily transmitted as other respiratory infection, however we still recommend that you avoid close contact with loved ones, especially the very young and elderly.  Remember to wash your hands thoroughly throughout the day as this is the number one way to prevent the spread of infection! ° °Home Care: °· Only take medications as instructed by your medical team. °· Complete the entire course of an antibiotic. °· Do not take these medications with alcohol. °· A steam or ultrasonic humidifier can help congestion.  You can place a towel over your head and breathe in the steam from hot water coming from a faucet. °· Avoid close contacts especially the very young and the elderly. °· Cover your mouth when you cough or sneeze. °· Always remember to wash your hands. ° °Get Help Right Away If: °· You develop worsening fever or sinus pain. °· You develop a severe head ache or visual changes. °· Your symptoms persist after you have completed your treatment plan. ° °Make sure you °· Understand these instructions. °· Will watch your  condition. °· Will get help right away if you are not doing well or get worse. ° °Your e-visit answers were reviewed by a board certified advanced clinical practitioner to complete your personal care plan.  Depending on the condition, your plan could have included both over the counter or prescription medications. ° °If there is a problem please reply  once you have received a response from your provider. ° °Your safety is important to us.  If you have drug allergies check your prescription carefully.   ° °You can use MyChart to ask questions about today’s visit, request a non-urgent call back, or ask for a work or school excuse for 24 hours related to this e-Visit. If it has been greater than 24 hours you will need to follow up with your provider, or enter a new e-Visit to address those concerns. ° °You will get an e-mail in the next two days asking about your experience.  I hope that your e-visit has been valuable and will speed your recovery. Thank you for using e-visits. ° °Greater than 5 minutes, yet less than 10 minutes of time have been spent researching, coordinating, and implementing care for this patient.  ° ° ° °

## 2020-11-10 ENCOUNTER — Ambulatory Visit (INDEPENDENT_AMBULATORY_CARE_PROVIDER_SITE_OTHER): Payer: 59 | Admitting: Family Medicine

## 2020-11-10 ENCOUNTER — Other Ambulatory Visit (INDEPENDENT_AMBULATORY_CARE_PROVIDER_SITE_OTHER): Payer: Self-pay | Admitting: Family Medicine

## 2020-11-10 ENCOUNTER — Other Ambulatory Visit: Payer: Self-pay

## 2020-11-10 ENCOUNTER — Encounter (INDEPENDENT_AMBULATORY_CARE_PROVIDER_SITE_OTHER): Payer: Self-pay | Admitting: Family Medicine

## 2020-11-10 VITALS — BP 114/80 | HR 69 | Temp 98.0°F | Ht 64.0 in | Wt 231.0 lb

## 2020-11-10 DIAGNOSIS — R632 Polyphagia: Secondary | ICD-10-CM | POA: Diagnosis not present

## 2020-11-10 DIAGNOSIS — K5909 Other constipation: Secondary | ICD-10-CM | POA: Diagnosis not present

## 2020-11-10 DIAGNOSIS — E559 Vitamin D deficiency, unspecified: Secondary | ICD-10-CM

## 2020-11-10 DIAGNOSIS — Z9189 Other specified personal risk factors, not elsewhere classified: Secondary | ICD-10-CM

## 2020-11-10 DIAGNOSIS — F419 Anxiety disorder, unspecified: Secondary | ICD-10-CM

## 2020-11-10 DIAGNOSIS — Z6839 Body mass index (BMI) 39.0-39.9, adult: Secondary | ICD-10-CM

## 2020-11-10 MED ORDER — WEGOVY 2.4 MG/0.75ML ~~LOC~~ SOAJ: 2.4000 mg | SUBCUTANEOUS | 0 refills | Status: DC

## 2020-11-11 NOTE — Progress Notes (Signed)
Chief Complaint:   OBESITY Julie Mcneil is here to discuss her progress with her obesity treatment plan along with follow-up of her obesity related diagnoses.   Today's visit was #: 15 Starting weight: 273 lbs Starting date: 01/08/2020 Today's weight: 231 lbs Today's date: 11/10/2020 Total lbs lost to date: 42 lbs Body mass index is 39.65 kg/m.  Total weight loss percentage to date: -15.31%  Interim History: Julie Mcneil is down 42 pounds today (down 56 pounds total).  She says her family had COVID over the holidays.  She has a new gym partner (friend). Nutrition Plan: the Category 3 Plan for 50% of the time.  Anti-obesity medications: Wegovy 2.4 mg subcutaneously weekly. Reported side effects: None. Hunger is well controlled. Cravings are well controlled.  Activity: Walking for 30 minutes 2-3 times per day.  Assessment/Plan:   1. Other constipation Improving. The current medical regimen is effective;  continue present plan and medications.  2. Polyphagia Improved. She will continue to focus on protein-rich, low simple carbohydrate foods. We reviewed the importance of hydration, regular exercise for stress reduction, and restorative sleep.  - Refill Semaglutide-Weight Management (WEGOVY) 2.4 MG/0.75ML SOAJ; Inject 2.4 mg into the skin once a week.  Dispense: 3 mL; Refill: 0  3. Vitamin D deficiency Not at goal. Current vitamin D is 25.1, tested on 06/09/2020. Optimal goal > 50 ng/dL.   Plan:  [x]   Continue Vitamin D @50 ,000 IU every week. []   Continue home supplement daily. [x]   Follow-up for routine testing of Vitamin D at least 2-3 times per year to avoid over-replacement.  4. Anxiety, with emotional eating Julie Mcneil is taking Wellbutrin and Celexa.  Behavior modification techniques were discussed today to help Julie Mcneil deal with her anxiety.    5. At risk for heart disease Julie Mcneil was given approximately 8 minutes of coronary artery disease prevention counseling today.  She is 40 y.o. female and has risk factors for heart disease including obesity and prediabetes. We discussed intensive lifestyle modifications today with an emphasis on specific weight loss instructions and strategies. Repetitive spaced learning was employed today to elicit superior memory formation and behavioral change.  During insulin resistance, several metabolic alterations induce the development of cardiovascular disease. For instance, insulin resistance can induce an imbalance in glucose metabolism that generates chronic hyperglycemia, which in turn triggers oxidative stress and causes an inflammatory response that leads to cell damage. Insulin resistance can also alter systemic lipid metabolism which then leads to the development of dyslipidemia and the well-known lipid triad: (1) high levels of plasma triglycerides, (2) low levels of high-density lipoprotein, and (3) the appearance of small dense low-density lipoproteins. This triad, along with endothelial dysfunction, which can also be induced by aberrant insulin signaling, contribute to atherosclerotic plaque formation.   6. Class 2 severe obesity with serious comorbidity and body mass index (BMI) of 39.0 to 39.9 in adult, unspecified obesity type (HCC)  Course: Julie Mcneil is currently in the action stage of change. As such, her goal is to continue with weight loss efforts.   Nutrition goals: She has agreed to the Category 3 Plan.   Exercise goals: For substantial health benefits, adults should do at least 150 minutes (2 hours and 30 minutes) a week of moderate-intensity, or 75 minutes (1 hour and 15 minutes) a week of vigorous-intensity aerobic physical activity, or an equivalent combination of moderate- and vigorous-intensity aerobic activity. Aerobic activity should be performed in episodes of at least 10 minutes, and preferably, it should be spread  throughout the week.  Behavioral modification strategies: increasing lean protein intake,  decreasing simple carbohydrates, increasing vegetables, increasing water intake and decreasing liquid calories.  Julie Mcneil has agreed to follow-up with our clinic in 3 weeks. She was informed of the importance of frequent follow-up visits to maximize her success with intensive lifestyle modifications for her multiple health conditions.   Objective:   Blood pressure 114/80, pulse 69, temperature 98 F (36.7 C), temperature source Oral, height 5\' 4"  (1.626 m), weight 231 lb (104.8 kg), SpO2 97 %. Body mass index is 39.65 kg/m.  General: Cooperative, alert, well developed, in no acute distress. HEENT: Conjunctivae and lids unremarkable. Cardiovascular: Regular rhythm.  Lungs: Normal work of breathing. Neurologic: No focal deficits.   Lab Results  Component Value Date   CREATININE 0.92 06/09/2020   BUN 11 06/09/2020   NA 140 06/09/2020   K 4.5 06/09/2020   CL 102 06/09/2020   CO2 23 06/09/2020   Lab Results  Component Value Date   ALT 19 06/09/2020   AST 19 06/09/2020   ALKPHOS 116 06/09/2020   BILITOT 0.5 06/09/2020   Lab Results  Component Value Date   HGBA1C 5.7 (H) 06/09/2020   HGBA1C 5.7 (H) 01/08/2020   HGBA1C 5.5 04/09/2017   HGBA1C 5.6 09/20/2015   HGBA1C 5.8 (H) 07/28/2015   Lab Results  Component Value Date   INSULIN 17.0 06/09/2020   INSULIN 18.0 01/08/2020   Lab Results  Component Value Date   TSH 1.990 01/08/2020   Lab Results  Component Value Date   CHOL 201 (H) 06/09/2020   HDL 51 06/09/2020   LDLCALC 125 (H) 06/09/2020   TRIG 138 06/09/2020   CHOLHDL 3.7 01/08/2020   Lab Results  Component Value Date   WBC 8.1 06/09/2020   HGB 11.6 06/09/2020   HCT 37.6 06/09/2020   MCV 84 06/09/2020   PLT 353 06/09/2020   Lab Results  Component Value Date   IRON 37 01/08/2020   TIBC 327 01/08/2020   FERRITIN 26 01/08/2020   Attestation Statements:   Reviewed by clinician on day of visit: allergies, medications, problem list, medical history,  surgical history, family history, social history, and previous encounter notes.  I, 03/09/2020, CMA, am acting as transcriptionist for Insurance claims handler, DO  I have reviewed the above documentation for accuracy and completeness, and I agree with the above. Helane Rima, DO

## 2020-12-02 ENCOUNTER — Ambulatory Visit (INDEPENDENT_AMBULATORY_CARE_PROVIDER_SITE_OTHER): Payer: 59 | Admitting: Family Medicine

## 2020-12-02 ENCOUNTER — Encounter (INDEPENDENT_AMBULATORY_CARE_PROVIDER_SITE_OTHER): Payer: Self-pay | Admitting: Family Medicine

## 2020-12-02 ENCOUNTER — Other Ambulatory Visit: Payer: Self-pay

## 2020-12-02 VITALS — BP 119/81 | HR 74 | Temp 98.2°F | Ht 64.0 in | Wt 225.0 lb

## 2020-12-02 DIAGNOSIS — Z6838 Body mass index (BMI) 38.0-38.9, adult: Secondary | ICD-10-CM

## 2020-12-02 DIAGNOSIS — E559 Vitamin D deficiency, unspecified: Secondary | ICD-10-CM

## 2020-12-02 DIAGNOSIS — K5909 Other constipation: Secondary | ICD-10-CM | POA: Diagnosis not present

## 2020-12-02 DIAGNOSIS — R632 Polyphagia: Secondary | ICD-10-CM

## 2020-12-02 DIAGNOSIS — Z9189 Other specified personal risk factors, not elsewhere classified: Secondary | ICD-10-CM

## 2020-12-02 MED ORDER — WEGOVY 2.4 MG/0.75ML ~~LOC~~ SOAJ: 2.4000 mg | SUBCUTANEOUS | 0 refills | Status: DC

## 2020-12-03 NOTE — Telephone Encounter (Signed)
Last OV with Dr Wallace 

## 2020-12-06 ENCOUNTER — Encounter (INDEPENDENT_AMBULATORY_CARE_PROVIDER_SITE_OTHER): Payer: Self-pay | Admitting: Family Medicine

## 2020-12-06 ENCOUNTER — Encounter (INDEPENDENT_AMBULATORY_CARE_PROVIDER_SITE_OTHER): Payer: Self-pay

## 2020-12-06 ENCOUNTER — Telehealth (INDEPENDENT_AMBULATORY_CARE_PROVIDER_SITE_OTHER): Payer: Self-pay

## 2020-12-06 NOTE — Telephone Encounter (Signed)
Received fax from [employer] stating that pt has been denied coverage for [facility] because it is an excluded medication from coverage in accordance with the terms and conditions of your plan benefit. Pt notified.

## 2020-12-06 NOTE — Progress Notes (Signed)
Chief Complaint:   OBESITY Thi is here to discuss her progress with her obesity treatment plan along with follow-up of her obesity related diagnoses.   Today's visit was #: 16 Starting weight: 273 lbs Starting date: 01/08/2020 Today's weight: 225 lbs Today's date: 12/02/2020 Total lbs lost to date: 48 lbs Body mass index is 38.62 kg/m.  Total weight loss percentage to date: -17.58%  Interim History: Raksha days she is doing well.  She is exercising more. Struggling with polyphagia. Nutrition Plan: the Category 3 Plan for 95% of the time.  Activity: Cardio/strength training for 60 minutes 7 times per week.  Assessment/Plan:   1. Polyphagia Hyperphagia, also called polyphagia, refers to excessive feelings of hunger. This is more likely to be an issues for people that have diabetes, prediabetes, or insulin resistance. She will continue to focus on protein-rich, low simple carbohydrate foods. We reviewed the importance of hydration, regular exercise for stress reduction, and restorative sleep.  - Start Semaglutide-Weight Management (WEGOVY) 2.4 MG/0.75ML SOAJ; Inject 2.4 mg into the skin once a week.  Dispense: 3 mL; Refill: 0  2. Other constipation Controlled.  Getting to Good Bowel Health: Your goal is to have one soft bowel movement each day. Drink at least 8 glasses of water each day. Eat plenty of fiber (goal is over 30 grams each day). It is best to get most of your fiber from dietary sources which includes leafy green vegetables, fresh fruit, and whole grains. You may need to add fiber with the help of OTC fiber supplements. These include Metamucil, Citrucel, and Benefiber. If you are still having trouble, try adding Miralax. If all of these changes do not work, contact me for further direction.  Plan:  Continue Linzess 72 mcg daily.  3. Vitamin D deficiency Not at goal. Current vitamin D is 25.1, tested on 06/09/2020. Optimal goal > 50 ng/dL.   Plan:  [x]   Continue  Vitamin D @50 ,000 IU every week. []   Continue home supplement daily. [x]   Follow-up for routine testing of Vitamin D at least 2-3 times per year to avoid over-replacement.  4. At risk for heart disease Due to Julie Mcneil's current state of health and medical condition(s), she is at a higher risk for heart disease.   This puts the patient at much greater risk to subsequently develop cardiopulmonary conditions that can significantly affect patient's quality of life in a negative manner as well.    At least 8 minutes was spent on counseling Julie Mcneil about these concerns today. Initial goal is to lose at least 5-10% of starting weight to help reduce these risk factors.  We will continue to reassess these conditions on a fairly regular basis in an attempt to decrease patient's overall morbidity and mortality.  Evidence-based interventions for health behavior change were utilized today including the discussion of self monitoring techniques, problem-solving barriers and SMART goal setting techniques.  Specifically regarding patient's less desirable eating habits and patterns, we employed the technique of small changes when Carita has not been able to fully commit to her prudent nutritional plan.  5. Class 2 severe obesity with serious comorbidity and body mass index (BMI) of 38.0 to 38.9 in adult, unspecified obesity type (HCC)  Course: Julie Mcneil is currently in the action stage of change. As such, her goal is to continue with weight loss efforts.   Nutrition goals: She has agreed to the Category 3 Plan.   Exercise goals: For substantial health benefits, adults should do at least  150 minutes (2 hours and 30 minutes) a week of moderate-intensity, or 75 minutes (1 hour and 15 minutes) a week of vigorous-intensity aerobic physical activity, or an equivalent combination of moderate- and vigorous-intensity aerobic activity. Aerobic activity should be performed in episodes of at least 10 minutes, and  preferably, it should be spread throughout the week.  Behavioral modification strategies: increasing lean protein intake, decreasing simple carbohydrates and increasing vegetables.  Julie Mcneil has agreed to follow-up with our clinic in 3-4 weeks. She was informed of the importance of frequent follow-up visits to maximize her success with intensive lifestyle modifications for her multiple health conditions.   Objective:   Blood pressure 119/81, pulse 74, temperature 98.2 F (36.8 C), temperature source Oral, height 5\' 4"  (1.626 m), weight 225 lb (102.1 kg), SpO2 99 %. Body mass index is 38.62 kg/m.  General: Cooperative, alert, well developed, in no acute distress. HEENT: Conjunctivae and lids unremarkable. Cardiovascular: Regular rhythm.  Lungs: Normal work of breathing. Neurologic: No focal deficits.   Lab Results  Component Value Date   CREATININE 0.92 06/09/2020   BUN 11 06/09/2020   NA 140 06/09/2020   K 4.5 06/09/2020   CL 102 06/09/2020   CO2 23 06/09/2020   Lab Results  Component Value Date   ALT 19 06/09/2020   AST 19 06/09/2020   ALKPHOS 116 06/09/2020   BILITOT 0.5 06/09/2020   Lab Results  Component Value Date   HGBA1C 5.7 (H) 06/09/2020   HGBA1C 5.7 (H) 01/08/2020   HGBA1C 5.5 04/09/2017   HGBA1C 5.6 09/20/2015   HGBA1C 5.8 (H) 07/28/2015   Lab Results  Component Value Date   INSULIN 17.0 06/09/2020   INSULIN 18.0 01/08/2020   Lab Results  Component Value Date   TSH 1.990 01/08/2020   Lab Results  Component Value Date   CHOL 201 (H) 06/09/2020   HDL 51 06/09/2020   LDLCALC 125 (H) 06/09/2020   TRIG 138 06/09/2020   CHOLHDL 3.7 01/08/2020   Lab Results  Component Value Date   WBC 8.1 06/09/2020   HGB 11.6 06/09/2020   HCT 37.6 06/09/2020   MCV 84 06/09/2020   PLT 353 06/09/2020   Lab Results  Component Value Date   IRON 37 01/08/2020   TIBC 327 01/08/2020   FERRITIN 26 01/08/2020   Attestation Statements:   Reviewed by clinician on  day of visit: allergies, medications, problem list, medical history, surgical history, family history, social history, and previous encounter notes.  I, 03/09/2020, CMA, am acting as transcriptionist for Insurance claims handler, DO  I have reviewed the above documentation for accuracy and completeness, and I agree with the above. Helane Rima, DO

## 2020-12-06 NOTE — Telephone Encounter (Signed)
PA initiated via [email or web address] for [employer].   Key: Kellie Shropshire 2.4MG /0.75ML auto-injectors   Form: [employer] Electronic Prior Authorization Form (2017 NCPDP)  Determination:Wait for Determination Please wait for [employer] 2017 NCPDP to return a determination.

## 2020-12-15 ENCOUNTER — Telehealth: Payer: Self-pay | Admitting: Cardiology

## 2020-12-15 NOTE — Telephone Encounter (Signed)
Received a MyChart message via patient scheduling stating   "Appointment Request From: Julie Mcneil   With Provider: Rollene Rotunda, MD [facility]]   Preferred Date Range: 12/15/2020 - 12/16/2020   Preferred Times: Any Time   Reason for visit: Office Visit   Comments: Chest discomfort"  There are no available appointments with Dr. Antoine Poche in the time frame the patient is requesting. A message is being sent to the patient informing her this telephone note is being sent and a nurse will be in contact with her to discuss her symptoms further and schedule an appointment. Please advise.

## 2020-12-15 NOTE — Telephone Encounter (Signed)
Attempted to call the patient, no answer and unable to leave a message. "Caller not accepting calls at this time" recording. Sent patient a Clinical cytogeneticist message to call the office.

## 2020-12-22 ENCOUNTER — Telehealth: Payer: 59 | Admitting: Physician Assistant

## 2020-12-22 ENCOUNTER — Encounter (INDEPENDENT_AMBULATORY_CARE_PROVIDER_SITE_OTHER): Payer: Self-pay

## 2020-12-22 ENCOUNTER — Ambulatory Visit (INDEPENDENT_AMBULATORY_CARE_PROVIDER_SITE_OTHER): Payer: 59 | Admitting: Physician Assistant

## 2020-12-22 ENCOUNTER — Encounter (INDEPENDENT_AMBULATORY_CARE_PROVIDER_SITE_OTHER): Payer: Self-pay | Admitting: Family Medicine

## 2020-12-22 ENCOUNTER — Encounter (INDEPENDENT_AMBULATORY_CARE_PROVIDER_SITE_OTHER): Payer: Self-pay | Admitting: Physician Assistant

## 2020-12-22 ENCOUNTER — Other Ambulatory Visit: Payer: Self-pay

## 2020-12-22 DIAGNOSIS — R197 Diarrhea, unspecified: Secondary | ICD-10-CM | POA: Diagnosis not present

## 2020-12-22 MED ORDER — DICYCLOMINE HCL 10 MG PO CAPS: 10.0000 mg | ORAL_CAPSULE | Freq: Three times a day (TID) | ORAL | 0 refills | Status: DC

## 2020-12-22 NOTE — Progress Notes (Signed)
We are sorry that you are not feeling well.  Here is how we plan to help!  Based on what you have shared with me it looks like you have Acute Infectious Diarrhea.  Most cases of acute diarrhea are due to infections with virus and bacteria and are self-limited conditions lasting less than 14 days.  For your symptoms you may continue taking Imodium 2 mg tablets that are over the counter at your local pharmacy. Take two tablet now and then one after each loose stool up to 6 a day.   I have sent a prescription for Bentyl to take which will help your abdominal pain and cramping. If your abdominal pain is persistent or severe you will need to be evaluated in person.  Antibiotics are not needed for most people with diarrhea.   HOME CARE  We recommend changing your diet to help with your symptoms for the next few days.  Drink plenty of fluids that contain water salt and sugar. Sports drinks such as Gatorade may help.   You may try broths, soups, bananas, applesauce, soft breads, mashed potatoes or crackers.   You are considered infectious for as long as the diarrhea continues. Hand washing or use of alcohol based hand sanitizers is recommend.  It is best to stay out of work or school until your symptoms stop.   GET HELP RIGHT AWAY  If you have dark yellow colored urine or do not pass urine frequently you should drink more fluids.    If your symptoms worsen   If you feel like you are going to pass out (faint)  You have a new problem  MAKE SURE YOU   Understand these instructions.  Will watch your condition.  Will get help right away if you are not doing well or get worse.  Your e-visit answers were reviewed by a board certified advanced clinical practitioner to complete your personal care plan.  Depending on the condition, your plan could have included both over the counter or prescription medications.  If there is a problem please reply  once you have received a response from  your provider.  Your safety is important to [place].  If you have drug allergies check your prescription carefully.    You can use MyChart to ask questions about today's visit, request a non-urgent call back, or ask for a work or school excuse for 24 hours related to this e-Visit. If it has been greater than 24 hours you will need to follow up with your provider, or enter a new e-Visit to address those concerns.   You will get an e-mail in the next two days asking about your experience.  I hope that your e-visit has been valuable and will speed your recovery. Thank you for using e-visits.  Approximately 5 minutes was spent documenting and reviewing patient's chart.

## 2020-12-23 ENCOUNTER — Encounter (INDEPENDENT_AMBULATORY_CARE_PROVIDER_SITE_OTHER): Payer: Self-pay

## 2020-12-23 ENCOUNTER — Telehealth (INDEPENDENT_AMBULATORY_CARE_PROVIDER_SITE_OTHER): Payer: 59 | Admitting: Family Medicine

## 2020-12-23 ENCOUNTER — Encounter (INDEPENDENT_AMBULATORY_CARE_PROVIDER_SITE_OTHER): Payer: Self-pay | Admitting: Family Medicine

## 2020-12-23 DIAGNOSIS — R197 Diarrhea, unspecified: Secondary | ICD-10-CM

## 2020-12-23 DIAGNOSIS — Z6839 Body mass index (BMI) 39.0-39.9, adult: Secondary | ICD-10-CM

## 2020-12-23 DIAGNOSIS — R7303 Prediabetes: Secondary | ICD-10-CM

## 2020-12-23 MED ORDER — OZEMPIC (1 MG/DOSE) 2 MG/1.5ML ~~LOC~~ SOPN: 1.0000 mg | PEN_INJECTOR | SUBCUTANEOUS | 0 refills | Status: DC

## 2020-12-24 ENCOUNTER — Encounter (INDEPENDENT_AMBULATORY_CARE_PROVIDER_SITE_OTHER): Payer: Self-pay | Admitting: Family Medicine

## 2020-12-24 NOTE — Telephone Encounter (Signed)
Last OV with Dawn 

## 2020-12-27 NOTE — Progress Notes (Signed)
TeleHealth Visit:  Due to the COVID-19 pandemic, this visit was completed with telemedicine (audio/video) technology to reduce patient and provider exposure as well as to preserve personal protective equipment.   Julie Mcneil has verbally consented to this TeleHealth visit. The patient is located at home, the provider is located at the [facility] office. The participants in this visit include the listed provider and patient. The visit was conducted today via video.   Chief Complaint: OBESITY Julie Mcneil is here to discuss her progress with her obesity treatment plan along with follow-up of her obesity related diagnoses. Julie Mcneil is on the Category 3 Plan and states she is following her eating plan approximately 95% of the time. Julie Mcneil states she is doing cardio and weights 30 minutes 7 times per week.  Today's visit was #: 17 Starting weight: 273 lbs Starting date: 01/08/2020  Interim History: Julie Mcneil feels she is up 4 lbs since her last OV. She has run out of [employer] 6 month coupons. She is feeling munch hungrier without [id number]. She is doing a great job with exercise and goes to the gym at 5 AM M-F. She does not track extra calories.  Subjective:   1. Pre-diabetes Julie Mcneil reports polyphagia since she ran out of Wegovy.   Lab Results  Component Value Date   HGBA1C 5.7 (H) 06/09/2020   Lab Results  Component Value Date   INSULIN 17.0 06/09/2020   INSULIN 18.0 01/08/2020    2. Diarrhea, unspecified type Julie Mcneil has had diarrhea for 2 weeks. She has BM once a day, down from 5-6 times a day. She started on probiotic recently and has been using Imodium.  Assessment/Plan:   1. Pre-diabetes  New Rx for Ozempic sent to pharmacy, as per below.  - Semaglutide, 1 MG/DOSE, (OZEMPIC, 1 MG/DOSE,) 2 MG/1.5ML SOPN; Inject 1 mg into the skin once a week.  Dispense: 1.5 mL; Refill: 0  2. Diarrhea, unspecified type Continue to monitor. Continue probiotic. Diarrhea is  likely related to stopping Wegovy. Discontinue Imodium for one day to see how she does.  3. Class 2 severe obesity with serious comorbidity and body mass index (BMI) of 39.0 to 39.9 in adult, unspecified obesity type Julie Mcneil) Julie Mcneil is currently in the action stage of change. As such, her goal is to continue with weight loss efforts. She has agreed to the Category 3 Plan and keeping a food journal and adhering to recommended goals of 1400-1500 calories and 90-100 g protein.   Advised her to be diligent with tacking extra calories.  Exercise goals: As is  Behavioral modification strategies: decreasing simple carbohydrates and keeping a strict food journal.  Julie Mcneil has agreed to follow-up with our clinic in 2 weeks.   Objective:   VITALS: Per patient if applicable, see vitals. GENERAL: Alert and in no acute distress. CARDIOPULMONARY: No increased WOB. Speaking in clear sentences.  PSYCH: Pleasant and cooperative. Speech normal rate and rhythm. Affect is appropriate. Insight and judgement are appropriate. Attention is focused, linear, and appropriate.  NEURO: Oriented as arrived to appointment on time with no prompting.   Lab Results  Component Value Date   CREATININE 0.92 06/09/2020   BUN 11 06/09/2020   NA 140 06/09/2020   K 4.5 06/09/2020   CL 102 06/09/2020   CO2 23 06/09/2020   Lab Results  Component Value Date   ALT 19 06/09/2020   AST 19 06/09/2020   ALKPHOS 116 06/09/2020   BILITOT 0.5 06/09/2020   Lab Results  Component Value Date   HGBA1C 5.7 (H) 06/09/2020   HGBA1C 5.7 (H) 01/08/2020   HGBA1C 5.5 04/09/2017   HGBA1C 5.6 09/20/2015   HGBA1C 5.8 (H) 07/28/2015   Lab Results  Component Value Date   INSULIN 17.0 06/09/2020   INSULIN 18.0 01/08/2020   Lab Results  Component Value Date   TSH 1.990 01/08/2020   Lab Results  Component Value Date   CHOL 201 (H) 06/09/2020   HDL 51 06/09/2020   LDLCALC 125 (H) 06/09/2020   TRIG 138 06/09/2020   CHOLHDL 3.7  01/08/2020   Lab Results  Component Value Date   WBC 8.1 06/09/2020   HGB 11.6 06/09/2020   HCT 37.6 06/09/2020   MCV 84 06/09/2020   PLT 353 06/09/2020   Lab Results  Component Value Date   IRON 37 01/08/2020   TIBC 327 01/08/2020   FERRITIN 26 01/08/2020    Attestation Statements:   Reviewed by clinician on day of visit: allergies, medications, problem list, medical history, surgical history, family history, social history, and previous encounter notes.  Edmund Hilda, am acting as Energy manager for [place], FNP.  I have reviewed the above documentation for accuracy and completeness, and I agree with the above. - Jesse Sans, FNP

## 2020-12-28 ENCOUNTER — Encounter (INDEPENDENT_AMBULATORY_CARE_PROVIDER_SITE_OTHER): Payer: Self-pay

## 2020-12-28 ENCOUNTER — Encounter (INDEPENDENT_AMBULATORY_CARE_PROVIDER_SITE_OTHER): Payer: Self-pay | Admitting: Family Medicine

## 2020-12-28 DIAGNOSIS — Z6839 Body mass index (BMI) 39.0-39.9, adult: Secondary | ICD-10-CM

## 2020-12-29 MED ORDER — SAXENDA 18 MG/3ML ~~LOC~~ SOPN: 3.0000 mg | PEN_INJECTOR | Freq: Every day | SUBCUTANEOUS | 0 refills | Status: DC

## 2020-12-29 MED ORDER — BD PEN NEEDLE NANO 2ND GEN 32G X 4 MM MISC: 0 refills | Status: DC

## 2020-12-29 NOTE — Telephone Encounter (Signed)
fyi

## 2020-12-29 NOTE — Telephone Encounter (Signed)
Ozempic denied will not cover without diagnosis of DM. Pt has f/u with Dr.Wallace 01/05/21.

## 2020-12-30 ENCOUNTER — Encounter (INDEPENDENT_AMBULATORY_CARE_PROVIDER_SITE_OTHER): Payer: Self-pay | Admitting: Family Medicine

## 2020-12-30 NOTE — Telephone Encounter (Signed)
Please advise 

## 2021-01-05 ENCOUNTER — Ambulatory Visit (INDEPENDENT_AMBULATORY_CARE_PROVIDER_SITE_OTHER): Payer: 59 | Admitting: Family Medicine

## 2021-01-05 ENCOUNTER — Encounter (INDEPENDENT_AMBULATORY_CARE_PROVIDER_SITE_OTHER): Payer: Self-pay | Admitting: Family Medicine

## 2021-01-05 ENCOUNTER — Other Ambulatory Visit: Payer: Self-pay

## 2021-01-05 VITALS — BP 125/84 | HR 64 | Temp 98.0°F | Ht 64.0 in | Wt 236.0 lb

## 2021-01-05 DIAGNOSIS — F3289 Other specified depressive episodes: Secondary | ICD-10-CM | POA: Diagnosis not present

## 2021-01-05 DIAGNOSIS — Z9189 Other specified personal risk factors, not elsewhere classified: Secondary | ICD-10-CM

## 2021-01-05 DIAGNOSIS — E559 Vitamin D deficiency, unspecified: Secondary | ICD-10-CM

## 2021-01-05 DIAGNOSIS — E8881 Metabolic syndrome: Secondary | ICD-10-CM | POA: Diagnosis not present

## 2021-01-05 DIAGNOSIS — Z6841 Body Mass Index (BMI) 40.0 and over, adult: Secondary | ICD-10-CM

## 2021-01-05 MED ORDER — OZEMPIC (1 MG/DOSE) 4 MG/3ML ~~LOC~~ SOPN
1.0000 mg | PEN_INJECTOR | SUBCUTANEOUS | 0 refills | Status: DC
Start: 2021-01-05 — End: 2021-02-16

## 2021-01-06 ENCOUNTER — Telehealth (INDEPENDENT_AMBULATORY_CARE_PROVIDER_SITE_OTHER): Payer: Self-pay

## 2021-01-06 MED ORDER — BUPROPION HCL ER (SR) 150 MG PO TB12: 150.0000 mg | ORAL_TABLET | Freq: Every day | ORAL | 0 refills | Status: DC

## 2021-01-06 NOTE — Telephone Encounter (Signed)
PA has been initiated via [email or web address] for Ozempic.  Key: [id number] Rx #: [id number] Ozempic (1 MG/DOSE) 4MG pen-injectors   Form: [facility] Electronic Prior Authorization Form (2017 NCPDP) Determination: Wait for Determination Please wait for [employer] 2017 NCPDP to return a determination.

## 2021-01-06 NOTE — Progress Notes (Signed)
Chief Complaint:   OBESITY Julie Mcneil is here to discuss her progress with her obesity treatment plan along with follow-up of her obesity related diagnoses. Gregory is on the Category 3 Plan or keeping a food journal and adhering to recommended goals of 1400-1500 calories and 90-100 grams of protein daily and states she is following her eating plan approximately 90% of the time. Marshe states she is doing cardio and strengthening for 60-120 minutes 5 times per week.  Today's visit was #: 18 Starting weight: 273 lbs Starting date: 01/08/2020 Today's weight: 236 lbs Today's date: 01/05/2021 Total lbs lost to date: 37 Total lbs lost since last in-office visit: 0  Interim History: [name]'s insurance stopped covering GLP-1 for weight loss or pre-diabetes, and her weight has increased along with increased hunger. She has been decreasing her calories below 1400 most days and increasing exercise, but her weight is still going in the wrong direction.  Subjective:   1. Vitamin D deficiency Julie Mcneil is on Vit D, and she denies nausea, vomiting, or muscle weakness. Her Vit D level is not yet at goal.  2. Insulin resistance Julie Mcneil notes significantly increased polyphagia, worse since her Ozempic was stopped due to insurance failure to cover.   3. Other depression, with emotional eating  Julie Mcneil is not tolerating Wellbutrin regularly, and she still struggles with emotional eating especially if stress is increased.  4. At risk for diabetes mellitus Julie Mcneil is at higher than average risk for developing diabetes due to obesity.   Assessment/Plan:   1. Vitamin D deficiency Low Vitamin D level contributes to fatigue and are associated with obesity, breast, and colon cancer. [name] agreed to continue taking prescription Vitamin D 50,000 IU every week, and we will check labs at her next visit. She will follow-up for routine testing of Vitamin D, at least 2-3 times per year to avoid  over-replacement.  2. Insulin resistance Julie Mcneil will continue to work on weight loss, exercise, and decreasing simple carbohydrates to help decrease the risk of diabetes.  Julie Mcneil agreed to start Ozempic 1 mg q weekly. Will try to get coverage and will follow up. She agreed to follow-up with [place] as directed to closely monitor her progress.  - Semaglutide, 1 MG/DOSE, (OZEMPIC, 1 MG/DOSE,) 4 MG/3ML SOPN; Inject 1 mg into the skin every 7 (seven) days.  Dispense: 3 mL; Refill: 0  3. Other depression, with emotional eating  Behavior modification techniques were discussed today to help Julie Mcneil deal with her emotional/non-hunger eating behaviors. Loda agreed to restart Wellbutrin SR 150 mg with 1 refill. Orders and follow up as documented in patient record.   4. At risk for diabetes mellitus Julie Mcneil was given approximately 15 minutes of diabetes education and counseling today. We discussed intensive lifestyle modifications today with an emphasis on weight loss as well as increasing exercise and decreasing simple carbohydrates in her diet. We also reviewed medication options with an emphasis on risk versus benefit of those discussed.   Repetitive spaced learning was employed today to elicit superior memory formation and behavioral change.  5. Class 3 severe obesity with serious comorbidity and body mass index (BMI) of 40.0 to 44.9 in adult, unspecified obesity type [facility]) Julie Mcneil is currently in the action stage of change. As such, her goal is to continue with weight loss efforts. She has agreed to keeping a food journal and adhering to recommended goals of 1400-1500 calories and 100+ grams of protein daily.   Exercise goals: As is.  Behavioral modification  strategies: increasing lean protein intake.  Julie Mcneil has agreed to follow-up with our clinic in 2 to 3 weeks. She was informed of the importance of frequent follow-up visits to maximize her success with intensive lifestyle  modifications for her multiple health conditions.   Objective:   Blood pressure 125/84, pulse 64, temperature 98 F (36.7 C), temperature source Oral, height 5\' 4"  (1.626 m), weight 236 lb (107 kg), SpO2 97 %. Body mass index is 40.51 kg/m.  General: Cooperative, alert, well developed, in no acute distress. HEENT: Conjunctivae and lids unremarkable. Cardiovascular: Regular rhythm.  Lungs: Normal work of breathing. Neurologic: No focal deficits.   Lab Results  Component Value Date   CREATININE 0.92 06/09/2020   BUN 11 06/09/2020   NA 140 06/09/2020   K 4.5 06/09/2020   CL 102 06/09/2020   CO2 23 06/09/2020   Lab Results  Component Value Date   ALT 19 06/09/2020   AST 19 06/09/2020   ALKPHOS 116 06/09/2020   BILITOT 0.5 06/09/2020   Lab Results  Component Value Date   HGBA1C 5.7 (H) 06/09/2020   HGBA1C 5.7 (H) 01/08/2020   HGBA1C 5.5 04/09/2017   HGBA1C 5.6 09/20/2015   HGBA1C 5.8 (H) 07/28/2015   Lab Results  Component Value Date   INSULIN 17.0 06/09/2020   INSULIN 18.0 01/08/2020   Lab Results  Component Value Date   TSH 1.990 01/08/2020   Lab Results  Component Value Date   CHOL 201 (H) 06/09/2020   HDL 51 06/09/2020   LDLCALC 125 (H) 06/09/2020   TRIG 138 06/09/2020   CHOLHDL 3.7 01/08/2020   Lab Results  Component Value Date   WBC 8.1 06/09/2020   HGB 11.6 06/09/2020   HCT 37.6 06/09/2020   MCV 84 06/09/2020   PLT 353 06/09/2020   Lab Results  Component Value Date   IRON 37 01/08/2020   TIBC 327 01/08/2020   FERRITIN 26 01/08/2020   Attestation Statements:   Reviewed by clinician on day of visit: allergies, medications, problem list, medical history, surgical history, family history, social history, and previous encounter notes.   I, 03/09/2020, am acting as transcriptionist for Burt Knack, MD.  I have reviewed the above documentation for accuracy and completeness, and I agree with the above. -  Quillian Quince, MD

## 2021-01-09 ENCOUNTER — Emergency Department (HOSPITAL_BASED_OUTPATIENT_CLINIC_OR_DEPARTMENT_OTHER): Payer: 59

## 2021-01-09 ENCOUNTER — Encounter (HOSPITAL_BASED_OUTPATIENT_CLINIC_OR_DEPARTMENT_OTHER): Payer: Self-pay | Admitting: Emergency Medicine

## 2021-01-09 ENCOUNTER — Emergency Department (HOSPITAL_BASED_OUTPATIENT_CLINIC_OR_DEPARTMENT_OTHER)
Admission: EM | Admit: 2021-01-09 | Discharge: 2021-01-09 | Disposition: A | Discharge status: Home / Self Care | Payer: 59 | Attending: Emergency Medicine | Admitting: Emergency Medicine

## 2021-01-09 ENCOUNTER — Other Ambulatory Visit: Payer: Self-pay

## 2021-01-09 DIAGNOSIS — R079 Chest pain, unspecified: Secondary | ICD-10-CM | POA: Insufficient documentation

## 2021-01-09 DIAGNOSIS — R0789 Other chest pain: Secondary | ICD-10-CM

## 2021-01-09 DIAGNOSIS — R1013 Epigastric pain: Secondary | ICD-10-CM | POA: Diagnosis not present

## 2021-01-09 DIAGNOSIS — Z79899 Other long term (current) drug therapy: Secondary | ICD-10-CM | POA: Diagnosis not present

## 2021-01-09 DIAGNOSIS — Z8616 Personal history of COVID-19: Secondary | ICD-10-CM | POA: Diagnosis not present

## 2021-01-09 LAB — COMPREHENSIVE METABOLIC PANEL
ALT: 16 U/L (ref 0–44)
AST: 18 U/L (ref 15–41)
Albumin: 4.1 g/dL (ref 3.5–5.0)
Alkaline Phosphatase: 82 U/L (ref 38–126)
Anion gap: 9 (ref 5–15)
BUN: 11 mg/dL (ref 6–20)
CO2: 25 mmol/L (ref 22–32)
Calcium: 8.9 mg/dL (ref 8.9–10.3)
Chloride: 102 mmol/L (ref 98–111)
Creatinine, Ser: 0.83 mg/dL (ref 0.44–1.00)
GFR, Estimated: >60 mL/min (ref >60)
Glucose, Bld: 100 mg/dL — ABNORMAL HIGH (ref 70–99)
Potassium: 3.8 mmol/L (ref 3.5–5.1)
Sodium: 136 mmol/L (ref 135–145)
Total Bilirubin: 0.7 mg/dL (ref 0.3–1.2)
Total Protein: 7.3 g/dL (ref 6.5–8.1)

## 2021-01-09 LAB — CBC WITH DIFFERENTIAL/PLATELET
Abs Immature Granulocytes: 0.04 10*3/uL (ref 0.00–0.07)
Basophils Absolute: 0.1 10*3/uL (ref 0.0–0.1)
Basophils Relative: 1 %
Eosinophils Absolute: 0.2 10*3/uL (ref 0.0–0.5)
Eosinophils Relative: 2 %
HCT: 34.2 % — ABNORMAL LOW (ref 36.0–46.0)
Hemoglobin: 11 g/dL — ABNORMAL LOW (ref 12.0–15.0)
Immature Granulocytes: 0 %
Lymphocytes Relative: 23 %
Lymphs Abs: 2.4 10*3/uL (ref 0.7–4.0)
MCH: 27.5 pg (ref 26.0–34.0)
MCHC: 32.2 g/dL (ref 30.0–36.0)
MCV: 85.5 fL (ref 80.0–100.0)
Monocytes Absolute: 0.5 10*3/uL (ref 0.1–1.0)
Monocytes Relative: 5 %
Neutro Abs: 7.2 10*3/uL (ref 1.7–7.7)
Neutrophils Relative %: 69 %
Platelets: 403 10*3/uL — ABNORMAL HIGH (ref 150–400)
RBC: 4 MIL/uL (ref 3.87–5.11)
RDW: 13.3 % (ref 11.5–15.5)
WBC: 10.5 10*3/uL (ref 4.0–10.5)
nRBC: 0 % (ref 0.0–0.2)

## 2021-01-09 LAB — PREGNANCY, URINE: Preg Test, Ur: NEGATIVE

## 2021-01-09 LAB — LIPASE, BLOOD: Lipase: 26 U/L (ref 11–51)

## 2021-01-09 LAB — TROPONIN I (HIGH SENSITIVITY): Troponin I (High Sensitivity): 2 ng/L (ref <18)

## 2021-01-09 MED ORDER — FAMOTIDINE IN NACL 20-0.9 MG/50ML-% IV SOLN
20.0000 mg | Freq: Once | INTRAVENOUS | Status: AC
  Administered 2021-01-09: 20 mg via INTRAVENOUS
  Filled 2021-01-09: qty 50

## 2021-01-09 MED ORDER — ALUM & MAG HYDROXIDE-SIMETH 200-200-20 MG/5ML PO SUSP
30.0000 mL | Freq: Once | ORAL | Status: DC
  Filled 2021-01-09: qty 30

## 2021-01-09 MED ORDER — ESOMEPRAZOLE MAGNESIUM 40 MG PO CPDR: 40.0000 mg | DELAYED_RELEASE_CAPSULE | Freq: Every day | ORAL | 0 refills | Status: DC

## 2021-01-09 MED ORDER — LIDOCAINE VISCOUS HCL 2 % MT SOLN
15.0000 mL | Freq: Once | OROMUCOSAL | Status: DC
  Filled 2021-01-09: qty 15

## 2021-01-09 NOTE — ED Notes (Signed)
ED Provider at bedside. 

## 2021-01-09 NOTE — ED Notes (Signed)
ED Provider at bedside. Dr. Yao 

## 2021-01-09 NOTE — ED Triage Notes (Signed)
Pt c/o center chest pain onset Friday. Pt reports B/L jaw tightness onset last night. Pt reports hands and feet with sweating.

## 2021-01-09 NOTE — ED Notes (Signed)
Will need [place] for IV when placed, could not find anything tangible for bloodwork.

## 2021-01-09 NOTE — ED Provider Notes (Signed)
[facility] EMERGENCY DEPARTMENT Provider Note   CSN: [id number] Arrival date & time: 01/09/21  1346     History Chief Complaint  Patient presents with  . Chest Pain    Julie Mcneil is a 40 y.o. female hx of GERD, overweight here with chest pain. Chest pressure and pain for the last 2 days. Burning sensation in the chest. Denies any radiation to the pain. Denies any shortness of breath. Has epigastric pain as well. Denies any recent travel or heart disease. Patient thought it could be anxiety but want to come and get checked out.   The history is provided by the patient.       Past Medical History:  Diagnosis Date  . Anemia   . Anxiety    Panic attacks  . Cervical dysplasia   . GERD (gastroesophageal reflux disease)   . Obesity   . Palpitations   . UTI (urinary tract infection)    recurrent  . Vaginal yeast infection    recurrent  . Vertigo     Patient Active Problem List   Diagnosis Date Noted  . Vitamin D deficiency 06/09/2020  . Absolute anemia 06/09/2020  . Prediabetes 02/09/2020  . COVID-19 virus infection 11/06/2019  . Recurrent syncope 11/12/2017  . Generalized anxiety disorder 02/02/2016  . Anxiety 11/22/2015  . Cervicogenic headache 06/14/2015  . Class 2 severe obesity with serious comorbidity and body mass index (BMI) of 39.0 to 39.9 in adult (HCC) 12/28/2014  . Heart palpitations 01/22/2014    Past Surgical History:  Procedure Laterality Date  . CESAREAN SECTION  8101,7510   11-28-11  . COLPOSCOPY    . TUBAL LIGATION     2013 C-SECTION     OB History    Gravida  4   Para  3   Term  3   Preterm  0   AB  1   Living  3     SAB  0   IAB  0   Ectopic  1   Multiple  0   Live Births  3           Family History  Problem Relation Age of Onset  . Early death Mother        Tax inspector  . Heart disease Maternal Grandmother   . Diabetes Maternal Grandmother   . Cancer Maternal Grandmother        Lymphoma  .  Diabetes Father   . Hypertension Father   . Hyperlipidemia Father   . Hypertension Paternal Grandmother   . Stroke Paternal Grandmother   . Hypertension Paternal Grandfather   . Anesthesia problems Neg Hx   . Hypotension Neg Hx   . Malignant hyperthermia Neg Hx   . Pseudochol deficiency Neg Hx     Social History   Tobacco Use  . Smoking status: Never Smoker  . Smokeless tobacco: Never Used  Vaping Use  . Vaping Use: Never used  Substance Use Topics  . Alcohol use: Yes    Alcohol/week: 0.0 standard drinks    Comment: occ  . Drug use: No    Home Medications Prior to Admission medications   Medication Sig Start Date End Date Taking? Authorizing Provider  buPROPion (WELLBUTRIN SR) 150 MG 12 hr tablet Take 1 tablet (150 mg total) by mouth daily. 01/06/21   Quillian Quince D, MD  citalopram (CELEXA) 20 MG tablet Take 1 tablet (20 mg total) by mouth daily. 10/07/20   Helane Rima, DO  nystatin ointment (MYCOSTATIN) Apply 1 application topically 2 (two) times daily. 10/07/20   Helane Rima, DO  Semaglutide, 1 MG/DOSE, (OZEMPIC, 1 MG/DOSE,) 4 MG/3ML SOPN Inject 1 mg into the skin every 7 (seven) days. 01/05/21   Quillian Quince D, MD  Vitamin D, Ergocalciferol, (DRISDOL) 1.25 MG (50000 UNIT) CAPS capsule Take 1 capsule (50,000 Units total) by mouth every 7 (seven) days. 10/07/20   Helane Rima, DO    Allergies    Sumatriptan, Levaquin [levofloxacin in d5w], and Nitrofurantoin monohyd macro  Review of Systems   Review of Systems  Cardiovascular: Positive for chest pain.  All other systems reviewed and are negative.   Physical Exam Updated Vital Signs BP (!) 164/98 (BP Location: Left Arm)   Pulse 76   Temp 98.6 F (37 C) (Oral)   Resp 16   Ht 5\' 4"  (1.626 m)   Wt 107 kg   LMP 01/06/2021   SpO2 100%   BMI 40.51 kg/m   Physical Exam Vitals and nursing note reviewed.  Constitutional:      Appearance: She is well-developed.  HENT:     Head: Normocephalic.  Eyes:      Pupils: Pupils are equal, round, and reactive to light.  Cardiovascular:     Rate and Rhythm: Normal rate and regular rhythm.     Heart sounds: Normal heart sounds.  Pulmonary:     Effort: Pulmonary effort is normal.     Breath sounds: Normal breath sounds.  Abdominal:     Palpations: Abdomen is soft.     Comments: Mild epigastric tenderness, no rebound   Musculoskeletal:        General: Normal range of motion.     Cervical back: Normal range of motion and neck supple.  Skin:    General: Skin is warm.     Capillary Refill: Capillary refill takes less than 2 seconds.  Neurological:     General: No focal deficit present.     Mental Status: She is alert and oriented to person, place, and time.  Psychiatric:        Mood and Affect: Mood normal.        Behavior: Behavior normal.     ED Results / Procedures / Treatments   Labs (all labs ordered are listed, but only abnormal results are displayed) Labs Reviewed  PREGNANCY, URINE  CBC WITH DIFFERENTIAL/PLATELET  COMPREHENSIVE METABOLIC PANEL  LIPASE, BLOOD  TROPONIN I (HIGH SENSITIVITY)  TROPONIN I (HIGH SENSITIVITY)    EKG EKG Interpretation  Date/Time:  Sunday January 09 2021 14:01:59 EST Ventricular Rate:  75 PR Interval:  164 QRS Duration: 88 QT Interval:  394 QTC Calculation: 439 R Axis:   89 Text Interpretation: Normal sinus rhythm Normal ECG No significant change since last tracing Confirmed by 01-01-1995 [phone]) on 01/09/2021 3:29:15 PM   Radiology DG Chest 2 View  Result Date: 01/09/2021 CLINICAL DATA:  Chest pain and pressure for 2 days. EXAM: CHEST - 2 VIEW COMPARISON:  02/06/2018 FINDINGS: Midline trachea.  Normal heart size and mediastinal contours. Sharp costophrenic angles.  No pneumothorax.  Clear lungs. IMPRESSION: No active cardiopulmonary disease. Electronically Signed   By: 04/08/2018 M.D.   On: 01/09/2021 14:19    Procedures Procedures   Medications Ordered in ED Medications  famotidine  (PEPCID) IVPB 20 mg premix (has no administration in time range)  alum & mag hydroxide-simeth (MAALOX/MYLANTA) 200-200-20 MG/5ML suspension 30 mL (has no administration in time range)    And  lidocaine (XYLOCAINE) 2 % viscous mouth solution 15 mL (has no administration in time range)    ED Course  I have reviewed the triage vital signs and the nursing notes.  Pertinent labs & imaging results that were available during my care of the patient were reviewed by me and considered in my medical decision making (see chart for details).    MDM Rules/Calculators/A&P                         Julie Mcneil is a 40 y.o. female here with chest pain. Likely reflux. Atypical for ACS and I doubt PE or dissection. Will give pepcid, GI cocktail. Will check trop x 1 and cbc and cmp.   7:03 PM Trop normal. Labs unremarkable. CXR clear. I think likely reflux. Felt better after pepcid. Stable for discharge     Final Clinical Impression(s) / ED Diagnoses Final diagnoses:  None    Rx / DC Orders ED Discharge Orders    None       Charlynne Pander, MD 01/09/21 1904

## 2021-01-09 NOTE — Discharge Instructions (Signed)
Take nexium daily   See your doctor   Your heart enzyme is normal today   Return to ER if you have worse chest pain, trouble breathing

## 2021-01-09 NOTE — ED Notes (Signed)
Pt given water with verbal approval from EDP Silverio Lay

## 2021-01-09 NOTE — ED Notes (Signed)
See EDP assessment 

## 2021-01-10 ENCOUNTER — Encounter (INDEPENDENT_AMBULATORY_CARE_PROVIDER_SITE_OTHER): Payer: Self-pay | Admitting: Family Medicine

## 2021-01-10 NOTE — Telephone Encounter (Signed)
Pt had OV on 03/02 with Dr Dalbert Garnet

## 2021-01-11 ENCOUNTER — Encounter (INDEPENDENT_AMBULATORY_CARE_PROVIDER_SITE_OTHER): Payer: Self-pay

## 2021-01-11 NOTE — Telephone Encounter (Signed)
Received notification from [employer] company stating that PA for Ozempic has been denied.   The reason(s) cited are as follows:  The request for coverage for Ozempic Inj 4mg /83ml, use as directed (43ml per month), is denied. This decision is based on health plan criteria for Ozempic. This medicine is covered only if: You have a diagnosis of type 2 diabetes mellitus.

## 2021-01-14 ENCOUNTER — Encounter (INDEPENDENT_AMBULATORY_CARE_PROVIDER_SITE_OTHER): Payer: Self-pay | Admitting: Family Medicine

## 2021-01-17 NOTE — Telephone Encounter (Signed)
Last OV with Dr. Beasley 

## 2021-01-21 ENCOUNTER — Other Ambulatory Visit (INDEPENDENT_AMBULATORY_CARE_PROVIDER_SITE_OTHER): Payer: Self-pay | Admitting: Family Medicine

## 2021-01-21 DIAGNOSIS — F3289 Other specified depressive episodes: Secondary | ICD-10-CM

## 2021-01-24 NOTE — Telephone Encounter (Signed)
Last OV with Dr. Beasley 

## 2021-01-26 ENCOUNTER — Encounter (INDEPENDENT_AMBULATORY_CARE_PROVIDER_SITE_OTHER): Payer: Self-pay | Admitting: Family Medicine

## 2021-01-26 ENCOUNTER — Other Ambulatory Visit: Payer: Self-pay

## 2021-01-26 ENCOUNTER — Ambulatory Visit (INDEPENDENT_AMBULATORY_CARE_PROVIDER_SITE_OTHER): Payer: 59 | Admitting: Family Medicine

## 2021-01-26 VITALS — BP 111/74 | HR 64 | Temp 97.9°F | Ht 64.0 in | Wt 238.0 lb

## 2021-01-26 DIAGNOSIS — Z6841 Body Mass Index (BMI) 40.0 and over, adult: Secondary | ICD-10-CM

## 2021-01-26 DIAGNOSIS — R7303 Prediabetes: Secondary | ICD-10-CM | POA: Diagnosis not present

## 2021-01-26 DIAGNOSIS — Z9189 Other specified personal risk factors, not elsewhere classified: Secondary | ICD-10-CM | POA: Diagnosis not present

## 2021-01-26 DIAGNOSIS — F3289 Other specified depressive episodes: Secondary | ICD-10-CM | POA: Diagnosis not present

## 2021-01-26 MED ORDER — METFORMIN HCL 500 MG PO TABS: 500.0000 mg | ORAL_TABLET | Freq: Two times a day (BID) | ORAL | 0 refills | Status: DC

## 2021-01-31 NOTE — Progress Notes (Signed)
Chief Complaint:   OBESITY Julie Mcneil is here to discuss her progress with her obesity treatment plan along with follow-up of her obesity related diagnoses. Julie Mcneil is on keeping a food journal and adhering to recommended goals of 1400-1500 calories and 100+ grams of protein daily and states she is following her eating plan approximately 40% of the time. Julie Mcneil states she is at the gym for 60 minutes 5-6 times per week.  Today's visit was #: 19 Starting weight: 273 lbs Starting date: 01/08/2020 Today's weight: 238 lbs Today's date: 01/26/2021 Total lbs lost to date: 35 Total lbs lost since last in-office visit: 0  Interim History: Julie Mcneil had been on Wegovy but no longer has coverage. She notes hunger since being off of Wegovy. She has not been journaling because she is discouraged due to the lack of weight loss. She would like another medication for hunger.  She has been on Topamax in the past and had increased anxiety. Phentermine also increased anxiety. She is currently on bupropion so Contrave is not an option.  Subjective:   1. Pre-diabetes Julie Mcneil notes polyphagia since stopping Wegovy. She has tolerated metformin in the past. Last A1c was 5.7.   Lab Results  Component Value Date   HGBA1C 5.7 (H) 06/09/2020   Lab Results  Component Value Date   INSULIN 17.0 06/09/2020   INSULIN 18.0 01/08/2020   2. Other depression, with emotional eating  Julie Mcneil's mood is stable on bupropion 150 mg q daily and Celexa 20 mg. She does note increased food cravings since stopping Wegovy.  3. At risk for side effect of medication Julie Mcneil is at risk for drug side effects due to starting metformin.  Assessment/Plan:   1. Pre-diabetes  Julie Mcneil agreed to start metformin 500 mg with meals BID (she is to take once daily for 1 week and then increase to BID if tolerated well).  - metFORMIN (GLUCOPHAGE) 500 MG tablet; Take 1 tablet (500 mg total) by mouth 2 (two) times daily with a  meal.  Dispense: 60 tablet; Refill: 0  2. Other depression, with emotional eating   Julie Mcneil will continue bupropion and Celexa. Orders and follow up as documented in patient record.   3. At risk for side effect of medication Julie Mcneil was given approximately 15 minutes of drug side effect counseling today.  We discussed side effect possibility and risk versus benefits. Julie Mcneil agreed to the medication and will contact this office if these side effects are intolerable.  Repetitive spaced learning was employed today to elicit superior memory formation and behavioral change.  4. Class 3 severe obesity: BMI 40 Julie Mcneil is currently in the action stage of change. As such, her goal is to continue with weight loss efforts. She has agreed to following a lower carbohydrate, vegetable and lean protein rich diet plan.   We discussed the Low Carbohydrate plan and she will try it.   We discussed various medication options to help Julie Mcneil with her weight loss efforts and she notes phentermine and Topamax caused anxiety in the past.  We will recheck IC and fasting labs at her office visit in 6 weeks.  Exercise goals: As is.  Behavioral modification strategies: increasing lean protein intake and decreasing simple carbohydrates.  Julie Mcneil has agreed to follow-up with our clinic in 3 weeks and 6 weeks with Julie Hamburger, NP (fasting labs, IC in 6 weeks)  Objective:   Blood pressure 111/74, pulse 64, temperature 97.9 F (36.6 C), height 5\' 4"  (1.626 m), weight 238  lb (108 kg), last menstrual period 01/06/2021, SpO2 99 %. Body mass index is 40.85 kg/m.  General: Cooperative, alert, well developed, in no acute distress. HEENT: Conjunctivae and lids unremarkable. Cardiovascular: Regular rhythm.  Lungs: Normal work of breathing. Neurologic: No focal deficits.   Lab Results  Component Value Date   CREATININE 0.83 01/09/2021   BUN 11 01/09/2021   NA 136 01/09/2021   K 3.8 01/09/2021   CL  102 01/09/2021   CO2 25 01/09/2021   Lab Results  Component Value Date   ALT 16 01/09/2021   AST 18 01/09/2021   ALKPHOS 82 01/09/2021   BILITOT 0.7 01/09/2021   Lab Results  Component Value Date   HGBA1C 5.7 (H) 06/09/2020   HGBA1C 5.7 (H) 01/08/2020   HGBA1C 5.5 04/09/2017   HGBA1C 5.6 09/20/2015   HGBA1C 5.8 (H) 07/28/2015   Lab Results  Component Value Date   INSULIN 17.0 06/09/2020   INSULIN 18.0 01/08/2020   Lab Results  Component Value Date   TSH 1.990 01/08/2020   Lab Results  Component Value Date   CHOL 201 (H) 06/09/2020   HDL 51 06/09/2020   LDLCALC 125 (H) 06/09/2020   TRIG 138 06/09/2020   CHOLHDL 3.7 01/08/2020   Lab Results  Component Value Date   WBC 10.5 01/09/2021   HGB 11.0 (L) 01/09/2021   HCT 34.2 (L) 01/09/2021   MCV 85.5 01/09/2021   PLT 403 (H) 01/09/2021   Lab Results  Component Value Date   IRON 37 01/08/2020   TIBC 327 01/08/2020   FERRITIN 26 01/08/2020   Attestation Statements:   Reviewed by clinician on day of visit: allergies, medications, problem list, medical history, surgical history, family history, social history, and previous encounter notes.   Julie Mcneil, am acting as Energy manager for [place], FNP-C.  I have reviewed the above documentation for accuracy and completeness, and I agree with the above. -  Jesse Sans, FNP

## 2021-02-16 ENCOUNTER — Telehealth (INDEPENDENT_AMBULATORY_CARE_PROVIDER_SITE_OTHER): Payer: 59 | Admitting: Bariatrics

## 2021-02-16 ENCOUNTER — Encounter (INDEPENDENT_AMBULATORY_CARE_PROVIDER_SITE_OTHER): Payer: Self-pay | Admitting: Bariatrics

## 2021-02-16 DIAGNOSIS — Z6841 Body Mass Index (BMI) 40.0 and over, adult: Secondary | ICD-10-CM

## 2021-02-16 DIAGNOSIS — R7303 Prediabetes: Secondary | ICD-10-CM

## 2021-02-16 DIAGNOSIS — E559 Vitamin D deficiency, unspecified: Secondary | ICD-10-CM | POA: Diagnosis not present

## 2021-02-16 MED ORDER — TRULICITY 0.75 MG/0.5ML ~~LOC~~ SOAJ: 0.7500 mg | SUBCUTANEOUS | 0 refills | Status: DC

## 2021-02-17 ENCOUNTER — Encounter (INDEPENDENT_AMBULATORY_CARE_PROVIDER_SITE_OTHER): Payer: Self-pay | Admitting: Bariatrics

## 2021-02-17 NOTE — Progress Notes (Signed)
TeleHealth Visit:  Due to the COVID-19 pandemic, this visit was completed with telemedicine (audio/video) technology to reduce patient and provider exposure as well as to preserve personal protective equipment.   Julie Mcneil has verbally consented to this TeleHealth visit. The patient is located at home, the provider is located at the [facility] office. The participants in this visit include the listed provider and patient. The visit was conducted today via MyChart video.  Chief Complaint: OBESITY Julie Mcneil is here to discuss her progress with her obesity treatment plan along with follow-up of her obesity related diagnoses. Julie Mcneil is on the Category 2 Plan and states she is following her eating plan approximately 80% of the time. Julie Mcneil states she is walking for 30 minutes 4-6 times per week.  Today's visit was #: 20 Starting weight: 273 lbs Starting date: 01/08/2020  Interim History: Julie Mcneil states that her weight remains the same.  She has not felt well today.  She has struggled since taking the Wegovy.  Subjective:   1. Pre-diabetes Julie Mcneil has a diagnosis of prediabetes based on her elevated HgA1c and was informed this puts her at greater risk of developing diabetes. She continues to work on diet and exercise to decrease her risk of diabetes. She denies nausea or hypoglycemia.  She was prescribed Ozempic, but insurance would not approve.  Taking metformin twice daily.  Lab Results  Component Value Date   HGBA1C 5.7 (H) 06/09/2020   Lab Results  Component Value Date   INSULIN 17.0 06/09/2020   INSULIN 18.0 01/08/2020   2. Vitamin D deficiency Julie Mcneil's Vitamin D level was 25.1 on 06/09/2020. She is currently taking prescription vitamin D 50,000 IU each week. She denies nausea, vomiting or muscle weakness.  Assessment/Plan:   1. Pre-diabetes Julie Mcneil will continue to work on weight loss, exercise, and decreasing simple carbohydrates to help decrease  the risk of diabetes.  Continue metformin.  Find alternatives for carbohydrates.  Start Trulicity 0.75 mg subcutaneously weekly, as per below.  - Start Dulaglutide (TRULICITY) 0.75 MG/0.5ML SOPN; Inject 0.75 mg into the skin once a week.  Dispense: 2 mL; Refill: 0  2. Vitamin D deficiency Low Vitamin D level contributes to fatigue and are associated with obesity, breast, and colon cancer. She agrees to continue to take prescription Vitamin D @50 ,000 IU every week and will follow-up for routine testing of Vitamin D, at least 2-3 times per year to avoid over-replacement.  3. Class 3 severe obesity with serious comorbidity and body mass index (BMI) of 40.0 to 44.9 in adult, unspecified obesity type Julie Mcneil)  Julie Mcneil is currently in the action stage of change. As such, her goal is to continue with weight loss efforts. She has agreed to the Category 2 Plan.   She will work on continuing to adhere closely to the plan and meal planning.  Exercise goals: As is.  Behavioral modification strategies: increasing lean protein intake, decreasing simple carbohydrates, increasing vegetables, increasing water intake, decreasing eating out, no skipping meals, meal planning and cooking strategies, keeping healthy foods in the home and planning for success.  Julie Mcneil has agreed to follow-up with our clinic in 2 weeks. She was informed of the importance of frequent follow-up visits to maximize her success with intensive lifestyle modifications for her multiple health conditions.  Objective:   VITALS: Per patient if applicable, see vitals. GENERAL: Alert and in no acute distress. CARDIOPULMONARY: No increased WOB. Speaking in clear sentences.  PSYCH: Pleasant and cooperative. Speech normal rate and  rhythm. Affect is appropriate. Insight and judgement are appropriate. Attention is focused, linear, and appropriate.  NEURO: Oriented as arrived to appointment on time with no prompting.   Lab Results  Component  Value Date   CREATININE 0.83 01/09/2021   BUN 11 01/09/2021   NA 136 01/09/2021   K 3.8 01/09/2021   CL 102 01/09/2021   CO2 25 01/09/2021   Lab Results  Component Value Date   ALT 16 01/09/2021   AST 18 01/09/2021   ALKPHOS 82 01/09/2021   BILITOT 0.7 01/09/2021   Lab Results  Component Value Date   HGBA1C 5.7 (H) 06/09/2020   HGBA1C 5.7 (H) 01/08/2020   HGBA1C 5.5 04/09/2017   HGBA1C 5.6 09/20/2015   HGBA1C 5.8 (H) 07/28/2015   Lab Results  Component Value Date   INSULIN 17.0 06/09/2020   INSULIN 18.0 01/08/2020   Lab Results  Component Value Date   TSH 1.990 01/08/2020   Lab Results  Component Value Date   CHOL 201 (H) 06/09/2020   HDL 51 06/09/2020   LDLCALC 125 (H) 06/09/2020   TRIG 138 06/09/2020   CHOLHDL 3.7 01/08/2020   Lab Results  Component Value Date   WBC 10.5 01/09/2021   HGB 11.0 (L) 01/09/2021   HCT 34.2 (L) 01/09/2021   MCV 85.5 01/09/2021   PLT 403 (H) 01/09/2021   Lab Results  Component Value Date   IRON 37 01/08/2020   TIBC 327 01/08/2020   FERRITIN 26 01/08/2020   Attestation Statements:   Reviewed by clinician on day of visit: allergies, medications, problem list, medical history, surgical history, family history, social history, and previous encounter notes.  I, Insurance claims handler, CMA, am acting as Energy manager for [employer], DO  I have reviewed the above documentation for accuracy and completeness, and I agree with the above. Corinna Capra, DO

## 2021-02-19 ENCOUNTER — Other Ambulatory Visit (INDEPENDENT_AMBULATORY_CARE_PROVIDER_SITE_OTHER): Payer: Self-pay | Admitting: Family Medicine

## 2021-02-19 DIAGNOSIS — R7303 Prediabetes: Secondary | ICD-10-CM

## 2021-02-21 NOTE — Telephone Encounter (Signed)
Refill request

## 2021-02-23 ENCOUNTER — Telehealth: Payer: 59 | Admitting: Physician Assistant

## 2021-02-23 DIAGNOSIS — W57XXXA Bitten or stung by nonvenomous insect and other nonvenomous arthropods, initial encounter: Secondary | ICD-10-CM | POA: Diagnosis not present

## 2021-02-23 DIAGNOSIS — S20362A Insect bite (nonvenomous) of left front wall of thorax, initial encounter: Secondary | ICD-10-CM | POA: Diagnosis not present

## 2021-02-23 MED ORDER — DOXYCYCLINE HYCLATE 100 MG PO TABS: 100.0000 mg | ORAL_TABLET | Freq: Two times a day (BID) | ORAL | 0 refills | Status: DC

## 2021-02-23 NOTE — Progress Notes (Signed)
I have spent 5 minutes in review of e-visit questionnaire, review and updating patient chart, medical decision making and response to patient.   Alejandro Gamel Cody Mylene Bow, PA-C    

## 2021-02-23 NOTE — Progress Notes (Signed)
Thank you for describing your tick bite, Here is how we plan to help! Based on the information that you shared with me it looks like you have A tick that bite that we will treat with a short course of doxycycline.  In most cases a tick bite is painless and does not itch.  Most tick bites in which the tick is quickly removed do not require prescriptions. Ticks can transmit several diseases if they are infected and remain attacked to your skin. Therefore the length that the tick was attached and any symptoms you have experienced after the bite are import to accurately develop your custom treatment plan. In most cases a single dose of doxycycline may prevent the development of a more serious condition.  Based on your information I have Provided a home care guide for tick bites and  instructions on when to call for help.  I have sent in a script for Doxycycline 100 mg to take twice daily for 7 days.   If you have worsening symptoms -- headache, fatigue, aches and/or development of fever, you need to be seen by your PCP or at an Urgent Care or ER facility. I do recommend that you go ahead and schedule a follow-up with your PCP for the end of this week for re-evaluation and lab testing before the weekend.   Which ticks  are associated with illness?  The Wood Tick (dog tick) is the size of a watermelon seed and can sometimes transmit [facility] spotted fever and [place] tick fever.   The Deer Tick (black-legged tick) is between the size of a poppy seed (pin head) and an apple seed, and can sometimes transmit Lyme disease.  A brown to black tick with a white splotch on its back is likely a female Amblyomma americanum ([place] tick). This tick has been associated with Southern Tick Associated illness ( STARI)  Lyme disease has become the most common tick-borne illness in the [place]. The risk of Lyme disease following a recognized deer tick bite is estimated to be 1%.  The majority of  cases of Lyme disease start with a bull's eye rash at the site of the tick bite. The rash can occur days to weeks (typically 7-10 days) after a tick bite. Treatment with antibiotics is indicated if this rash appears. Flu-like symptoms may accompany the rash, including: fever, chills, headaches, muscle aches, and fatigue. Removing ticks promptly may prevent tick borne disease.  What can be used to prevent Tick Bites?   Insect repellant with at leas 20% DEET.  Wearing long pants with sock and shoes.  Avoiding tall grass and heavily wooded areas.  Checking your skin after being outdoors.  Shower with a washcloth after outdoor exposures.  HOME CARE ADVICE FOR TICK BITE  1. Wood Tick Removal:  o Use a pair of tweezers and grasp the wood tick close to the skin (on its head). Pull the wood tick straight upward without twisting or crushing it. Maintain a steady pressure until it releases its grip.   o If tweezers aren't available, use fingers, a loop of thread around the jaws, or a needle between the jaws for traction.  o Note: covering the tick with petroleum jelly, nail polish or rubbing alcohol doesn't work. Neither does touching the tick with a hot or cold object. 2. Tiny Deer Tick Removal:   o Needs to be scraped off with a knife blade or credit card edge. o Place tick in a sealed container (  e.g. glass jar, zip lock plastic bag), in case your doctor wants to see it. 3. Tick's Head Removal:  o If the wood tick's head breaks off in the skin, it must be removed. Clean the skin. Then use a sterile needle to uncover the head and lift it out or scrape it off.  o If a very small piece of the head remains, the skin will eventually slough it off. 4. Antibiotic Ointment:  o Wash the wound and your hands with soap and water after removal to prevent catching any tick disease.  Apply an over the counter antibiotic ointment (e.g. bacitracin) to the bite once. 5. Expected Course: Tick bites normally  don't itch or hurt. That's why they often go unnoticed. 6. Call Your Doctor If:  o You can't remove the tick or the tick's head o Fever, a severe head ache, or rash occur in the next 2 weeks o Bite begins to look infected o Lyme's disease is common in your area o You have not had a tetanus in the last 10 years o Your current symptoms become worse    MAKE SURE YOU   Understand these instructions.  Will watch your condition.  Will get help right away if you are not doing well or get worse.   Thank you for choosing an e-visit.  Your e-visit answers were reviewed by a board certified advanced clinical practitioner to complete your personal care plan. Depending upon the condition, your plan could have included both over the counter or prescription medications. Please review your pharmacy choice. If there is a problem you may use MyChart messaging to have the prescription routed to another pharmacy. Your safety is important to [place]. If you have drug allergies check your prescription carefully.   You can use MyChart to ask questions about today's visit, request a non-urgent call back, or ask for a work or school excuse for 24 hours related to this e-Visit. If it has been greater than 24 hours you will need to follow up with your provider, or enter a new e-Visit to address those concerns.  You will get an email in the next two days asking about your experience. I hope  that your e-visit has been valuable and will speed your recovery

## 2021-03-02 ENCOUNTER — Encounter (INDEPENDENT_AMBULATORY_CARE_PROVIDER_SITE_OTHER): Payer: Self-pay | Admitting: Family Medicine

## 2021-03-07 ENCOUNTER — Encounter (INDEPENDENT_AMBULATORY_CARE_PROVIDER_SITE_OTHER): Payer: Self-pay

## 2021-03-07 ENCOUNTER — Other Ambulatory Visit: Payer: Self-pay

## 2021-03-07 ENCOUNTER — Telehealth (INDEPENDENT_AMBULATORY_CARE_PROVIDER_SITE_OTHER): Payer: 59 | Admitting: Adult Health

## 2021-03-08 ENCOUNTER — Telehealth: Payer: 59 | Admitting: Nurse Practitioner

## 2021-03-08 DIAGNOSIS — J0100 Acute maxillary sinusitis, unspecified: Secondary | ICD-10-CM | POA: Diagnosis not present

## 2021-03-09 MED ORDER — AMOXICILLIN-POT CLAVULANATE 875-125 MG PO TABS: 1.0000 | ORAL_TABLET | Freq: Two times a day (BID) | ORAL | 0 refills | Status: DC

## 2021-03-09 NOTE — Progress Notes (Signed)

## 2021-03-17 ENCOUNTER — Other Ambulatory Visit (INDEPENDENT_AMBULATORY_CARE_PROVIDER_SITE_OTHER): Payer: Self-pay | Admitting: Bariatrics

## 2021-03-17 DIAGNOSIS — R7303 Prediabetes: Secondary | ICD-10-CM

## 2021-03-17 NOTE — Telephone Encounter (Signed)
Pt last seen by Dr. Brown.  

## 2021-03-21 ENCOUNTER — Other Ambulatory Visit: Payer: Self-pay

## 2021-03-21 ENCOUNTER — Encounter (INDEPENDENT_AMBULATORY_CARE_PROVIDER_SITE_OTHER): Payer: Self-pay | Admitting: Family Medicine

## 2021-03-21 ENCOUNTER — Telehealth (INDEPENDENT_AMBULATORY_CARE_PROVIDER_SITE_OTHER): Payer: 59 | Admitting: Family Medicine

## 2021-03-21 DIAGNOSIS — Z6841 Body Mass Index (BMI) 40.0 and over, adult: Secondary | ICD-10-CM

## 2021-03-21 DIAGNOSIS — E559 Vitamin D deficiency, unspecified: Secondary | ICD-10-CM

## 2021-03-21 DIAGNOSIS — F3289 Other specified depressive episodes: Secondary | ICD-10-CM | POA: Diagnosis not present

## 2021-03-21 MED ORDER — VITAMIN D (ERGOCALCIFEROL) 1.25 MG (50000 UNIT) PO CAPS: 50000.0000 [IU] | ORAL_CAPSULE | ORAL | 0 refills | Status: DC

## 2021-03-21 MED ORDER — CITALOPRAM HYDROBROMIDE 20 MG PO TABS: 20.0000 mg | ORAL_TABLET | Freq: Every day | ORAL | 0 refills | Status: DC

## 2021-03-21 MED ORDER — BUPROPION HCL ER (SR) 150 MG PO TB12: 150.0000 mg | ORAL_TABLET | Freq: Every day | ORAL | 0 refills | Status: DC

## 2021-03-21 NOTE — Telephone Encounter (Signed)
Pt reply .

## 2021-03-21 NOTE — Progress Notes (Signed)
TeleHealth Visit:  Due to the COVID-19 pandemic, this visit was completed with telemedicine (audio/video) technology to reduce patient and provider exposure as well as to preserve personal protective equipment.   Julie Mcneil has verbally consented to this TeleHealth visit. The patient is located at home, the provider is located at the [facility] office. The participants in this visit include the listed provider and patient. The visit was conducted today via MyChart video.   Chief Complaint: OBESITY Julie Mcneil is here to discuss her progress with her obesity treatment plan along with follow-up of her obesity related diagnoses. Julie Mcneil is on following a lower carbohydrate, vegetable and lean protein rich diet plan and states she is following her eating plan approximately 30-40% of the time. Julie Mcneil states she walks for 30-60 minutes 2-3 times per week.  Today's visit was #: 20 Starting weight: 273 lbs Starting date: 01/08/2020  Interim History: Julie Mcneil weight today is 256 lbs at home (BMI 43.92). She has not had an in-office visit since 01/26/2021. She says she is up about 15 lbs. Her daughter has been ill with kidney issues which are being managed at [facility]. Julie Mcneil notes she has been mostly off track since her daughter has been ill. She has been eating out quite a bit, but she is trying to make good choices.  She has completed the [facility] bariatric surgery informational seminar and is working on completion of the packet for weight loss surgery.  Subjective:   1. Vitamin D deficiency Julie Mcneil's last Vit D level was low at 25.1. She is on weekly prescription Vit D.  2. Other depression, with emotional eating  Julie Mcneil's mood is stable, and her cravings are mostly well controlled.  Assessment/Plan:   1. Vitamin D deficiency  We will refill prescription Vitamin D for 1 month. Myrlene will follow-up for routine testing of Vitamin D, at least 2-3 times per year to avoid  over-replacement.  - Vitamin D, Ergocalciferol, (DRISDOL) 1.25 MG (50000 UNIT) CAPS capsule; Take 1 capsule (50,000 Units total) by mouth every 7 (seven) days.  Dispense: 4 capsule; Refill: 0  2. Other depression, with emotional eating  . We will refill Wellbutrin SR for 1 month, and refill Celexa for 90 days with no refills. Orders and follow up as documented in patient record.   - buPROPion (WELLBUTRIN SR) 150 MG 12 hr tablet; Take 1 tablet (150 mg total) by mouth daily.  Dispense: 30 tablet; Refill: 0 - citalopram (CELEXA) 20 MG tablet; Take 1 tablet (20 mg total) by mouth daily.  Dispense: 90 tablet; Refill: 0  3. Obesity: BMI 40.83 Julie Mcneil is currently in the action stage of change. As such, her goal is to continue with weight loss efforts. She has agreed to keeping a food journal and adhering to recommended goals of 1200-1400 calories and 80 grams of protein daily.   Julie Mcneil will work on her packet for weight loss surgery, and I will complete the letter for [facility] (completed 03/21/21 in [facility] under "Communications". She is to keep her weight at 240 lbs or higher in order to remain a candidate for surgery (>40 BMI) She understands that bariatric surgery is a tool for weight loss and will not be successful long-term without significant dietary changes and exercise.  Exercise goals: As is.  Behavioral modification strategies: increasing lean protein intake, decreasing simple carbohydrates and keeping a strict food journal.  Julie Mcneil has agreed to follow-up with our clinic in 5 weeks.  Objective:   VITALS:  Per patient if applicable, see vitals. GENERAL: Alert and in no acute distress. CARDIOPULMONARY: No increased WOB. Speaking in clear sentences.  PSYCH: Pleasant and cooperative. Speech normal rate and rhythm. Affect is appropriate. Insight and judgement are appropriate. Attention is focused, linear, and appropriate.  NEURO: Oriented as arrived to appointment on  time with no prompting.   Lab Results  Component Value Date   CREATININE 0.83 01/09/2021   BUN 11 01/09/2021   NA 136 01/09/2021   K 3.8 01/09/2021   CL 102 01/09/2021   CO2 25 01/09/2021   Lab Results  Component Value Date   ALT 16 01/09/2021   AST 18 01/09/2021   ALKPHOS 82 01/09/2021   BILITOT 0.7 01/09/2021   Lab Results  Component Value Date   HGBA1C 5.7 (H) 06/09/2020   HGBA1C 5.7 (H) 01/08/2020   HGBA1C 5.5 04/09/2017   HGBA1C 5.6 09/20/2015   HGBA1C 5.8 (H) 07/28/2015   Lab Results  Component Value Date   INSULIN 17.0 06/09/2020   INSULIN 18.0 01/08/2020   Lab Results  Component Value Date   TSH 1.990 01/08/2020   Lab Results  Component Value Date   CHOL 201 (H) 06/09/2020   HDL 51 06/09/2020   LDLCALC 125 (H) 06/09/2020   TRIG 138 06/09/2020   CHOLHDL 3.7 01/08/2020   Lab Results  Component Value Date   WBC 10.5 01/09/2021   HGB 11.0 (L) 01/09/2021   HCT 34.2 (L) 01/09/2021   MCV 85.5 01/09/2021   PLT 403 (H) 01/09/2021   Lab Results  Component Value Date   IRON 37 01/08/2020   TIBC 327 01/08/2020   FERRITIN 26 01/08/2020    Attestation Statements:   Reviewed by clinician on day of visit: allergies, medications, problem list, medical history, surgical history, family history, social history, and previous encounter notes.   Trude Mcburney, am acting as Energy manager for [employer], FNP-C.  I have reviewed the above documentation for accuracy and completeness, and I agree with the above. - Jesse Sans, FNP

## 2021-03-28 ENCOUNTER — Telehealth (INDEPENDENT_AMBULATORY_CARE_PROVIDER_SITE_OTHER): Payer: Self-pay | Admitting: Emergency Medicine

## 2021-03-28 NOTE — Telephone Encounter (Signed)
Prior Berkley HarveyGibson RampFranki Monte  Ref: [id number]

## 2021-03-31 ENCOUNTER — Telehealth: Payer: 59 | Admitting: Physician Assistant

## 2021-03-31 DIAGNOSIS — B353 Tinea pedis: Secondary | ICD-10-CM | POA: Diagnosis not present

## 2021-03-31 DIAGNOSIS — B351 Tinea unguium: Secondary | ICD-10-CM

## 2021-03-31 MED ORDER — TERBINAFINE HCL 250 MG PO TABS: 250.0000 mg | ORAL_TABLET | Freq: Every day | ORAL | 0 refills | Status: DC

## 2021-03-31 NOTE — Progress Notes (Signed)
I have spent 5 minutes in review of e-visit questionnaire, review and updating patient chart, medical decision making and response to patient.   Hady Niemczyk Cody Seger Jani, PA-C    

## 2021-03-31 NOTE — Progress Notes (Signed)
E-Visit for Athlete's Foot  We are sorry that you are not feeling well. Here is how we plan to help!  Based on what you shared with me it looks like you have tinea pedis, or "Athlete's Foot".  This type of rash can spread through shared towels, clothing, bedding, etc., as well as hard surfaces (particularly in moist areas) such as shower stalls, locker room floors, pool areas, etc. The symptoms of Athlete's Foot include red, swollen, peeling, itchy skin between the toes (especially between the pinky toe and the one next to it). The sole and heel of the foot may also be affected. In severe cases, the skin on the feet can blister.  Athlete's foot can usually be treated with over-the-counter topical antifungal products; but sometimes with chronic or extensive tinea pedis, prescription oral medications are needed.   I am recommending:Clotrimazole 1% cream or gel, apply to area twice per day (over-the-counter)  Prescription medications are only indicated for an extensive rash or if over the counter treatments have failed.  I am prescribing:Terbinafine 250 mg once per day for one week   You also seem to have a significant nail fungus (onychomycosis) that typically requires several months of treatment by your primary provider or a podiatrist. Please reach out to your PCP to discuss this issue.   HOME CARE:  . Keep feet clean, dry, and cool. . Avoid using swimming pools, public showers, or foot baths. . Wear sandals when possible or air shoes out by alternating them every 2-3 days. . Avoid wearing closed shoes and wearing socks made from fabric that doesn't dry easily (for example, nylon). . Treat the infection with recommended medication  GET HELP RIGHT AWAY IF:  . Symptoms that don't go away after treatment. . Severe itching that persists. . If your rash spreads or swells. . If your rash begins to have drainage or smell. . You develop a fever.  MAKE SURE YOU   . Understand these  instructions. . Will watch your condition. . Will get help right away if you are not doing well or get worse.   Thank you for choosing an e-visit.  Your e-visit answers were reviewed by a board certified advanced clinical practitioner to complete your personal care plan. Depending upon the condition, your plan could have included both over the counter or prescription medications.  Please review your pharmacy choice. Make sure the pharmacy is open so you can pick up prescription now. If there is a problem, you may contact your provider through [employer] and have the prescription routed to another pharmacy.  Your safety is important to [place]. If you have drug allergies check your prescription carefully.   For the next 24 hours you can use MyChart to ask questions about today's visit, request a non-urgent call back, or ask for a work or school excuse.  You will get an email in the next two days asking about your experience. I hope that your e-visit has been valuable and will speed your recovery  References or for more information:  [email or web address].[email or web address]&source=search_result&selectedTitle=1~104&usage_type=default&display_rank=1  [email or web address]

## 2021-04-22 ENCOUNTER — Other Ambulatory Visit: Payer: Self-pay | Admitting: Surgery

## 2021-04-25 ENCOUNTER — Encounter (INDEPENDENT_AMBULATORY_CARE_PROVIDER_SITE_OTHER): Payer: Self-pay

## 2021-04-25 ENCOUNTER — Ambulatory Visit (INDEPENDENT_AMBULATORY_CARE_PROVIDER_SITE_OTHER): Payer: 59 | Admitting: Family Medicine

## 2021-05-11 ENCOUNTER — Ambulatory Visit
Admission: RE | Admit: 2021-05-11 | Discharge: 2021-05-11 | Disposition: A | Discharge status: Home / Self Care | Payer: 59 | Source: Ambulatory Visit | Attending: Surgery | Admitting: Surgery

## 2021-05-11 ENCOUNTER — Other Ambulatory Visit: Payer: Self-pay | Admitting: Surgery

## 2021-05-24 ENCOUNTER — Telehealth: Payer: 59 | Admitting: Family

## 2021-05-24 DIAGNOSIS — B029 Zoster without complications: Secondary | ICD-10-CM

## 2021-05-25 MED ORDER — VALACYCLOVIR HCL 1 G PO TABS: 1000.0000 mg | ORAL_TABLET | Freq: Three times a day (TID) | ORAL | 0 refills | Status: AC

## 2021-05-25 NOTE — Progress Notes (Signed)
E-visit for Shingles   We are sorry that you are not feeling well. Here is how we plan to help!  Based on what you shared with me it looks like you have shingles.  Shingles or herpes zoster, is a common infection of the nerves.  It is a painful rash caused by the herpes zoster virus.  This is the same virus that causes chickenpox.  After a person has chickenpox, the virus remains inactive in the nerve cells.  Years later, the virus can become active again and travel to the skin.  It typically will appear on one side of the face or body.  Burning or shooting pain, tingling, or itching are early signs of the infection.  Blisters typically scab over in 7 to 10 days and clear up within 2-4 weeks. Shingles is only contagious to people that have never had the chickenpox, the chickenpox vaccine, or anyone who has a compromised immune system.  You should avoid contact with these type of people until your blisters scab over.  I have prescribed Valacyclovir 1g three times daily for 7 days.    HOME CARE: Apply ice packs (wrapped in a thin towel), cool compresses, or soak in cool bath to help reduce pain. Use calamine lotion to calm itchy skin. Avoid scratching the rash. Avoid direct sunlight.  GET HELP RIGHT AWAY IF: Symptoms that don't away after treatment. A rash or blisters near your eye. Increased drainage, fever, or rash after treatment. Severe pain that doesn't go away.   MAKE SURE YOU   Understand these instructions. Will watch your condition. Will get help right away if you are not doing well or get worse.  Thank you for choosing an e-visit.  Your e-visit answers were reviewed by a board certified advanced clinical practitioner to complete your personal care plan. Depending upon the condition, your plan could have included both over the counter or prescription medications.  Please review your pharmacy choice. Make sure the pharmacy is open so you can pick up prescription now. If there  is a problem, you may contact your provider through MyChart messaging and have the prescription routed to another pharmacy.  Your safety is important to us. If you have drug allergies check your prescription carefully.   For the next 24 hours you can use MyChart to ask questions about today's visit, request a non-urgent call back, or ask for a work or school excuse. You will get an email in the next two days asking about your experience. I hope that your e-visit has been valuable and will speed your recovery.  Approximately 5 minutes was spent documenting and reviewing patient's chart.    

## 2021-06-03 ENCOUNTER — Telehealth: Payer: 59 | Admitting: Emergency Medicine

## 2021-06-03 DIAGNOSIS — J329 Chronic sinusitis, unspecified: Secondary | ICD-10-CM

## 2021-06-03 MED ORDER — AMOXICILLIN-POT CLAVULANATE 875-125 MG PO TABS: 1.0000 | ORAL_TABLET | Freq: Two times a day (BID) | ORAL | 0 refills | Status: DC

## 2021-06-03 NOTE — Progress Notes (Signed)

## 2021-06-16 ENCOUNTER — Ambulatory Visit: Payer: 59 | Admitting: Psychology

## 2021-06-20 ENCOUNTER — Encounter: Payer: 59 | Attending: Surgery | Admitting: Skilled Nursing Facility1

## 2021-06-20 ENCOUNTER — Encounter: Payer: Self-pay | Admitting: Skilled Nursing Facility1

## 2021-06-20 ENCOUNTER — Other Ambulatory Visit: Payer: Self-pay

## 2021-06-20 DIAGNOSIS — Z6841 Body Mass Index (BMI) 40.0 and over, adult: Secondary | ICD-10-CM | POA: Diagnosis present

## 2021-06-20 NOTE — Progress Notes (Signed)
Nutrition Assessment for Bariatric Surgery Medical Nutrition Therapy Appt Start Time: 2:13    End Time: 3:00  Patient was seen on 06/20/2021 for Pre-Operative Nutrition Assessment. Letter of approval faxed to [facility] bariatric surgery program coordinator on 06/20/2021.   Referral stated Supervised Weight Loss (SWL) visits needed: 0  Pt completed visits.   Pt has cleared nutrition requirements.   Planned surgery: sleeve gastrectomy  Pt expectation of surgery: to lose weight  Pt expectation of dietitian: none identified     NUTRITION ASSESSMENT   Anthropometrics  Start weight at [facility]: 275 lbs (date: 06/20/2021)  Height: 64 in BMI: 47.20 kg/m2     Clinical  Medical hx: anemia, anxiety, GERD Medications: iron, zinc, vitamin C, multivitamin, Zyrtec , vitamin D, wellbutrin   Labs: none updated in EMR Notable signs/symptoms: none reported  Any previous deficiencies? Yes: iron, vitamin D  Micronutrient Nutrition Focused Physical Exam: Hair: No issues observed Eyes: No issues observed Mouth: No issues observed Neck: No issues observed Nails: No issues observed Skin: No issues observed  Lifestyle & Dietary Hx   Pt states she walks every day 15 minutes.  Pt states she recently got a new job which she is happy with so far.   24-Hr Dietary Recall First Meal: coffee Snack: bar Second Meal: sandwich + chips Snack:  Third Meal: spaghetti or chicken + veggies Snack: ice cream Beverages: coffee + flavored creamer, water, soda   Estimated Energy Needs Calories: 1500   NUTRITION DIAGNOSIS  Overweight/obesity ([place]-3.3) related to past poor dietary habits and physical inactivity as evidenced by patient w/ planned sleeve gastrectomy surgery following dietary guidelines for continued weight loss.    NUTRITION INTERVENTION  Nutrition counseling (C-1) and education (E-2) to facilitate bariatric surgery goals.  Educated pt on micronutrient deficiencies post  surgery and strategies to mitigate that risk   Pre-Op Goals Reviewed with the Patient Track food and beverage intake (pen and paper, MyFitness Pal, Baritastic app, etc.) Make healthy food choices while monitoring portion sizes Consume 3 meals per day or try to eat every 3-5 hours Avoid concentrated sugars and fried foods Keep sugar & fat in the single digits per serving on food labels Practice CHEWING your food (aim for applesauce consistency) Practice not drinking 15 minutes before, during, and 30 minutes after each meal and snack Avoid all carbonated beverages (ex: soda, sparkling beverages)  Limit caffeinated beverages (ex: coffee, tea, energy drinks) Avoid all sugar-sweetened beverages (ex: regular soda, sports drinks)  Avoid alcohol  Aim for 64-100 ounces of FLUID daily (with at least half of fluid intake being plain water)  Aim for at least 60-80 grams of PROTEIN daily Look for a liquid protein source that contains ?15 g protein and ?5 g carbohydrate (ex: shakes, drinks, shots) Make a list of non-food related activities Physical activity is an important part of a healthy lifestyle so keep it moving! The goal is to reach 150 minutes of exercise per week, including cardiovascular and weight baring activity.  *Goals that are bolded indicate the pt would like to start working towards these  Handouts Provided Include  Bariatric Surgery handouts (Nutrition Visits, Pre-Op Goals, Protein Shakes, Vitamins & Minerals)  Learning Style & Readiness for Change Teaching method utilized: Visual & Auditory  Demonstrated degree of understanding via: Teach Back  Readiness Level: contemplative  Barriers to learning/adherence to lifestyle change: unidentified       MONITORING & EVALUATION Dietary intake, weekly physical activity, body weight, and pre-op goals reached at  next nutrition visit.    Next Steps  Patient is to follow up at [facility] for Pre-Op Class >2 weeks before surgery for further  nutrition education.   Pt has completed visits. No further supervised visits required

## 2021-06-21 ENCOUNTER — Ambulatory Visit (INDEPENDENT_AMBULATORY_CARE_PROVIDER_SITE_OTHER): Payer: 59 | Admitting: Nurse Practitioner

## 2021-06-21 ENCOUNTER — Encounter: Payer: Self-pay | Admitting: Nurse Practitioner

## 2021-06-21 VITALS — BP 126/80 | Ht 63.5 in | Wt 271.0 lb

## 2021-06-21 DIAGNOSIS — B372 Candidiasis of skin and nail: Secondary | ICD-10-CM | POA: Diagnosis not present

## 2021-06-21 DIAGNOSIS — Z01419 Encounter for gynecological examination (general) (routine) without abnormal findings: Secondary | ICD-10-CM

## 2021-06-21 MED ORDER — NYSTATIN-TRIAMCINOLONE 100000-0.1 UNIT/GM-% EX OINT: 1.0000 "application " | TOPICAL_OINTMENT | Freq: Two times a day (BID) | CUTANEOUS | 1 refills | Status: DC

## 2021-06-21 NOTE — Progress Notes (Signed)
   Julie Mcneil 1980-11-29 [id number]   History:  40 y.o. [id number] presents for annual exam. No GYN complaints. Monthly cycles. BTL. Normal pap history. She does have intermittent groin redness/itching. She applies Aquafor with some relief. In the process of planning bariatric surgery.   Gynecologic History Patient's last menstrual period was 06/09/2021. Period Cycle (Days): 28 Period Duration (Days): 7 Period Pattern: Regular Menstrual Flow: Moderate, Heavy Dysmenorrhea: None Contraception/Family planning: tubal ligation Sexually active: Yes  Health Maintenance Last Pap: 04/09/2017. Results were: Normal, 5-year repeat Last mammogram: Never Last colonoscopy: Not indicated Last Dexa: Not indicated   Past medical history, past surgical history, family history and social history were all reviewed and documented in the EPIC chart. Married. 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 3 daughters - 4th, 63th, and 10th graders.   ROS:  A ROS was performed and pertinent positives and negatives are included.  Exam:  Vitals:   06/21/21 0942  BP: 126/80  Weight: 271 lb (122.9 kg)  Height: 5' 3.5" (1.613 m)   Body mass index is 47.25 kg/m.  General appearance:  Normal Thyroid:  Symmetrical, normal in size, without palpable masses or nodularity. Respiratory  Auscultation:  Clear without wheezing or rhonchi Cardiovascular  Auscultation:  Regular rate, without rubs, murmurs or gallops  Edema/varicosities:  Not grossly evident Abdominal  Soft,nontender, without masses, guarding or rebound.  Liver/spleen:  No organomegaly noted  Hernia:  None appreciated  Skin  Inspection:  Redness, excoriation to bilateral groin Breasts: Examined lying and sitting.   Right: Without masses, retractions, nipple discharge or axillary adenopathy.   Left: Without masses, retractions, nipple discharge or axillary adenopathy. Genitourinary   Inguinal/mons:  Normal without inguinal  adenopathy  External genitalia:  Normal appearing vulva with no masses, tenderness, or lesions  BUS/Urethra/Skene's glands:  Normal  Vagina:  Normal appearing with normal color and discharge, no lesions  Cervix:  Normal appearing without discharge or lesions  Uterus:  Normal in size, shape and contour.  Midline and mobile, nontender  Adnexa/parametria:     Rt: Normal in size, without masses or tenderness.   Lt: Normal in size, without masses or tenderness.  Anus and perineum: Normal  Patient informed chaperone available to be present for breast and pelvic exam. Patient has requested no chaperone to be present. Patient has been advised what will be completed during breast and pelvic exam.   Assessment/Plan:  40 y.o. [id number] for annual exam.   Well female exam with routine gynecological exam - Education provided on SBEs, importance of preventative screenings, current guidelines, high calcium diet, regular exercise, and multivitamin daily. Labs done recently for pre-op (we do not have records).   Yeast infection of the skin - Plan: nystatin-triamcinolone ointment (MYCOLOG) applied twice daily as needed for redness/itching. Keep area clean and dry to prevent future episodes.   Screening for cervical cancer - Normal Pap history.  Will repeat at 5-year interval per guidelines.  Screening for breast cancer - Will start screenings at age 61.   Screening for colon cancer - Will start at age 46.   Return in 1 year for annual.   Olivia Mackie DNP, 10:05 AM 06/21/2021

## 2021-06-24 ENCOUNTER — Ambulatory Visit (INDEPENDENT_AMBULATORY_CARE_PROVIDER_SITE_OTHER): Payer: 59 | Admitting: Psychology

## 2021-06-29 ENCOUNTER — Ambulatory Visit (INDEPENDENT_AMBULATORY_CARE_PROVIDER_SITE_OTHER): Payer: 59 | Admitting: Psychology

## 2021-06-29 DIAGNOSIS — F509 Eating disorder, unspecified: Secondary | ICD-10-CM

## 2021-08-01 ENCOUNTER — Encounter: Payer: 59 | Attending: Surgery | Admitting: Skilled Nursing Facility1

## 2021-08-01 ENCOUNTER — Other Ambulatory Visit: Payer: Self-pay

## 2021-08-01 DIAGNOSIS — Z6841 Body Mass Index (BMI) 40.0 and over, adult: Secondary | ICD-10-CM | POA: Diagnosis present

## 2021-08-02 NOTE — Progress Notes (Signed)
Pre-Operative Nutrition Class:    Patient was seen on 08/02/2021 for Pre-Operative Bariatric Surgery Education [facility]    Surgery date:  Surgery type: sleeve gastrectomy  Start weight at [facility]: 275 pounds Weight today: 279 pounds   The following the learning objectives were met by the patient during this course: Identify Pre-Op Dietary Goals and will begin 2 weeks pre-operatively Identify appropriate sources of fluids and proteins  State protein recommendations and appropriate sources pre and post-operatively Identify Post-Operative Dietary Goals and will follow for 2 weeks post-operatively Identify appropriate multivitamin and calcium sources Describe the need for physical activity post-operatively and will follow MD recommendations State when to call healthcare provider regarding medication questions or post-operative complications When having a diagnosis of diabetes understanding hypoglycemia symptoms and the inclusion of 1 complex carbohydrate per meal  Handouts given during class include: Pre-Op Bariatric Surgery Diet Handout Protein Shake Handout Post-Op Bariatric Surgery Nutrition Handout BELT Program Information Flyer Support Group Information Flyer [facility] Bariatric Supplements Price List  Follow-Up Plan: Patient will follow-up at [facility] 2 weeks post operatively for diet advancement per MD.

## 2021-08-04 ENCOUNTER — Ambulatory Visit: Payer: Self-pay | Admitting: Surgery

## 2021-08-04 NOTE — H&P (Signed)
Max Sane [id number]   Referring Provider:  Self   Subjective   Chief Complaint: Return Weight Loss     History of Present Illness: Returns for further consultation regarding surgical treatment of morbid obesity.  Denies any changes to her health since her initial visit 3 months ago.  She has some questions to discuss today.  UGI 05/11/21: Very small hiatal hernia Dietitian: Improved [name] Psychologist: Approved Helmut Muster 06/24/2021)  Initial visit 04/19/2021:  40 year old woman with history of anemia, emotional eating, anxiety/panic attacks, GERD, obesity, palpitations, recurrent yeast infections and UTI, vertigo, vitamin D deficiency who presents in consultation for surgical management of severe obesity.  She has followed with [facility] since March 2021 and has completed 20 visits to date, achieving a weight loss in that period of about 20 pounds with some recent weight regain due to life stressors including her daughter's illness which has been managed at [facility].  Previously she has also tried Weight Watchers, wegovy which worked really well allowing her to lose 60 pounds, but ultimately had to stop because her insurance would no longer cover it as well as multiple diets and exercise regimens.  She is interested in the sleeve gastrectomy.  She denies any significant reflux, she did have an occasional episode when she was younger but is not on any medication and has not really had any issues with that recently.  Surgical history includes 3 C-sections 1 with a concomitant tubal ligation.  Family history of diabetes, hypertension and hyperlipidemia in her father, hypertension, diabetes heart disease stroke and lymphoma in her grandparents.  Obesity does run in some of the women in her father's side of the family.  Social history endorses occasional alcohol use, no drug use, no tobacco use. She is married with 3 daughters age 22, 45  and 96.  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  Most recent labs were in March of this year including a normal CMP, CBC notable for a hemoglobin of 11, she had a chest x-ray which was negative, and EKG rhythm is normal sinus rhythm.  This data was collected during an emergency room visit for severe chest pain.  Review of Systems: A complete review of systems was obtained from the patient.  I have reviewed this information and discussed as appropriate with the patient.  See HPI as well for other ROS.   Medical History: Past Medical History:  Diagnosis Date   Anemia    Anxiety     Patient Active Problem List  Diagnosis   Absolute anemia   Anxiety state   Anxiety   Cervicogenic headache   COVID-19 virus infection   Dizzy   Generalized anxiety disorder   Heart palpitations   Imbalance   Class 3 severe obesity with serious comorbidity and body mass index (BMI) of 45.0 to 49.9 in adult (CMS-HCC)   Obesity   Prediabetes   Recurrent syncope   Tinnitus of both ears   Vitamin D deficiency    Past Surgical History:  Procedure Laterality Date   CESAREAN SECTION     x 3 =2007,2011,2013     Allergies  Allergen Reactions   Sumatriptan Shortness Of Breath    Tight chest Tight chest Tight chest Tight chest    Levofloxacin In D5w Nausea   Nitrofurantoin Monohyd/M-Cryst Hives and Nausea And Vomiting   Other  Hives and Nausea And Vomiting    No current outpatient medications on file prior to visit.   No current facility-administered medications on file prior to visit.    History reviewed. No pertinent family history.   Social History   Tobacco Use  Smoking Status Never Smoker  Smokeless Tobacco Never Used     Social History   Socioeconomic History   Marital status: Married  Tobacco Use   Smoking status: Never Smoker   Smokeless tobacco: Never Used  Building services engineer Use:  Never used  Substance and Sexual Activity   Alcohol use: Yes   Drug use: Never    Objective:    Vitals:   08/04/21 0941  BP: 132/72  Pulse: (!) 139  Temp: 36.2 C (97.1 F)  SpO2: 98%  Weight: (!) 125.1 kg (275 lb 12.8 oz)  Height: 162.6 cm (5\' 4" )    Body mass index is 47.34 kg/m.    Assessment and Plan:  Diagnoses and all orders for this visit:  Morbid obesity (CMS-HCC)     She remains a candidate for sleeve gastrectomy. We have previously discussed the surgery including technical aspects, the risks of bleeding, infection, pain, scarring, injury to intra-abdominal structures, staple line leak or abscess, chronic abdominal pain or nausea, new onset or worsened GERD, DVT/PE, pneumonia, heart attack, stroke, death, failure to reach weight loss goals and weight regain, hernia. Discussed the typical peri-, and postoperative course. Discussed the importance of lifelong behavioral changes to combat the chronic and relapsing disease which is obesity. Questions were welcomed and answered.  Plan to proceed as scheduled for 08/16/2021 with laparoscopic sleeve gastrectomy  Tora Prunty 10/16/2021, MD

## 2021-08-04 NOTE — H&P (View-Only) (Signed)
Julie Mcneil [id number]   Referring Provider:  Self   Subjective   Chief Complaint: Return Weight Loss     History of Present Illness: Returns for further consultation regarding surgical treatment of morbid obesity.  Denies any changes to her health since her initial visit 3 months ago.  She has some questions to discuss today.  UGI 05/11/21: Very small hiatal hernia Dietitian: Improved Julie Mcneil) Psychologist: Approved Julie Mcneil 06/24/2021)  Initial visit 04/19/2021:  40 year old woman with history of anemia, emotional eating, anxiety/panic attacks, GERD, obesity, palpitations, recurrent yeast infections and UTI, vertigo, vitamin D deficiency who presents in consultation for surgical management of severe obesity.  She has followed with [facility] since March 2021 and has completed 20 visits to date, achieving a weight loss in that period of about 20 pounds with some recent weight regain due to life stressors including her daughter's illness which has been managed at [facility].  Previously she has also tried Weight Watchers, wegovy which worked really well allowing her to lose 60 pounds, but ultimately had to stop because her insurance would no longer cover it as well as multiple diets and exercise regimens.  She is interested in the sleeve gastrectomy.  She denies any significant reflux, she did have an occasional episode when she was younger but is not on any medication and has not really had any issues with that recently.  Surgical history includes 3 C-sections 1 with a concomitant tubal ligation.  Family history of diabetes, hypertension and hyperlipidemia in her father, hypertension, diabetes heart disease stroke and lymphoma in her grandparents.  Obesity does run in some of the women in her father's side of the family.  Social history endorses occasional alcohol use, no drug use, no tobacco use. She is married with 3 daughters age 22, 45  and 96.  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  Most recent labs were in March of this year including a normal CMP, CBC notable for a hemoglobin of 11, she had a chest x-ray which was negative, and EKG rhythm is normal sinus rhythm.  This data was collected during an emergency room visit for severe chest pain.  Review of Systems: A complete review of systems was obtained from the patient.  I have reviewed this information and discussed as appropriate with the patient.  See HPI as well for other ROS.   Medical History: Past Medical History:  Diagnosis Date   Anemia    Anxiety     Patient Active Problem List  Diagnosis   Absolute anemia   Anxiety state   Anxiety   Cervicogenic headache   COVID-19 virus infection   Dizzy   Generalized anxiety disorder   Heart palpitations   Imbalance   Class 3 severe obesity with serious comorbidity and body mass index (BMI) of 45.0 to 49.9 in adult (CMS-HCC)   Obesity   Prediabetes   Recurrent syncope   Tinnitus of both ears   Vitamin D deficiency    Past Surgical History:  Procedure Laterality Date   CESAREAN SECTION     x 3 =2007,2011,2013     Allergies  Allergen Reactions   Sumatriptan Shortness Of Breath    Tight chest Tight chest Tight chest Tight chest    Levofloxacin In D5w Nausea   Nitrofurantoin Monohyd/M-Cryst Hives and Nausea And Vomiting   Other  Hives and Nausea And Vomiting    No current outpatient medications on file prior to visit.   No current facility-administered medications on file prior to visit.    History reviewed. No pertinent family history.   Social History   Tobacco Use  Smoking Status Never Smoker  Smokeless Tobacco Never Used     Social History   Socioeconomic History   Marital status: Married  Tobacco Use   Smoking status: Never Smoker   Smokeless tobacco: Never Used  Building services engineer Use:  Never used  Substance and Sexual Activity   Alcohol use: Yes   Drug use: Never    Objective:    Vitals:   08/04/21 0941  BP: 132/72  Pulse: (!) 139  Temp: 36.2 C (97.1 F)  SpO2: 98%  Weight: (!) 125.1 kg (275 lb 12.8 oz)  Height: 162.6 cm (5\' 4" )    Body mass index is 47.34 kg/m.    Assessment and Plan:  Diagnoses and all orders for this visit:  Morbid obesity (CMS-HCC)     She remains a candidate for sleeve gastrectomy. We have previously discussed the surgery including technical aspects, the risks of bleeding, infection, pain, scarring, injury to intra-abdominal structures, staple line leak or abscess, chronic abdominal pain or nausea, new onset or worsened GERD, DVT/PE, pneumonia, heart attack, stroke, death, failure to reach weight loss goals and weight regain, hernia. Discussed the typical peri-, and postoperative course. Discussed the importance of lifelong behavioral changes to combat the chronic and relapsing disease which is obesity. Questions were welcomed and answered.  Plan to proceed as scheduled for 08/16/2021 with laparoscopic sleeve gastrectomy  Ivor Kishi 10/16/2021, MD

## 2021-08-08 ENCOUNTER — Encounter (HOSPITAL_COMMUNITY): Payer: Self-pay

## 2021-08-08 ENCOUNTER — Other Ambulatory Visit (HOSPITAL_COMMUNITY): Payer: Self-pay

## 2021-08-08 NOTE — Progress Notes (Addendum)
PCP - Dr. Oliver Barre Cardiologist - no  Saw Dr. Kirtland Bouchard 2020 found to have anxiety   PPM/ICD -  Device Orders -  Rep Notified -   Chest x-ray - 01-09-21  EKG - 01-10-21 Stress Test - 2020 epic ECHO -  Cardiac Cath -   Sleep Study -  CPAP -   Fasting Blood Sugar -  Checks Blood Sugar _____ times a day  Blood Thinner Instructions: Aspirin Instructions:  ERAS Protcol - PRE-SURGERY G2-   COVID TEST- 08-12-21 COVID vaccine -Had 1   shot Pfizer   Activity--Able to walk a flight of stairs without SOB Anesthesia review:   Patient denies shortness of breath, fever, cough and chest pain at PAT appointment   All instructions explained to the patient, with a verbal understanding of the material. Patient agrees to go over the instructions while at home for a better understanding. Patient also instructed to self quarantine after being tested for COVID-19. The opportunity to ask questions was provided.

## 2021-08-08 NOTE — Patient Instructions (Addendum)
DUE TO COVID-19 ONLY ONE VISITOR IS ALLOWED TO COME WITH YOU AND STAY IN THE WAITING ROOM ONLY DURING PRE OP AND PROCEDURE DAY OF SURGERY.   TWO VISITOR  MAY VISIT WITH YOU AFTER SURGERY IN YOUR PRIVATE ROOM DURING VISITING HOURS ONLY!  YOU NEED TO HAVE A COVID 19 TEST ON_10-7-22 ______ between 8am-3pm______, THIS TEST MUST BE DONE BEFORE SURGERY,     Please bring completed form with you to the COVID testing site   COVID TESTING SITE [address] [place]   [phone]      ONCE YOUR COVID TEST IS COMPLETED,  PLEASE Wear a mask when in public           Your procedure is scheduled on: 08-16-21   Report to [facility] Main  Entrance   Report to admitting at        1230 PM     Call this number if you have problems the morning of surgery [phone]   Remember: MORNING OF SURGERY DRINK:   DRINK 1 G2 drink BEFORE YOU LEAVE HOME,                                       DRINK ALL OF THE  G2 DRINK AT ONE TIME.    NO SOLID FOOD AFTER 600 PM THE NIGHT BEFORE YOUR SURGERY.   YOU MAY DRINK CLEAR FLUIDS.   THE G2 DRINK YOU DRINK BEFORE YOU LEAVE HOME WILL BE THE LAST FLUIDS YOU DRINK BEFORE SURGERY.  PAIN IS EXPECTED AFTER SURGERY AND WILL NOT BE COMPLETELY ELIMINATED. AMBULATION AND TYLENOL WILL HELP REDUCE INCISIONAL AND GAS PAIN. MOVEMENT IS KEY!  YOU ARE EXPECTED TO BE OUT OF BED WITHIN 4 HOURS OF ADMISSION TO YOUR PATIENT ROOM.  SITTING IN THE RECLINER THROUGHOUT THE DAY IS IMPORTANT FOR DRINKING FLUIDS AND MOVING GAS THROUGHOUT THE GI TRACT.  COMPRESSION STOCKINGS SHOULD BE WORN [facility] STAY UNLESS YOU ARE WALKING.   INCENTIVE SPIROMETER SHOULD BE USED EVERY HOUR WHILE AWAKE TO DECREASE POST-OPERATIVE COMPLICATIONS SUCH AS PNEUMONIA.  WHEN DISCHARGED HOME, IT IS IMPORTANT TO CONTINUE TO WALK EVERY HOUR AND USE THE INCENTIVE SPIROMETER EVERY HOUR.       CLEAR LIQUID DIET                                                                     water Black Coffee and tea, regular and decaf                             Plain Jell-O any favor except red or purple                                  Fruit ices (not with fruit pulp)                                      Iced Popsicles  Cranberry, grape and apple juices Sports drinks like Gatorade Lightly seasoned clear broth or consume(fat free) Sugar, honey   Sample Menu  Breakfast                                Lunch                                     Supper Cranberry juice                    Beef broth                            Chicken broth Jell-O                                     Grape juice                           Apple juice Coffee or tea                        Jell-O                                      Popsicle                                                Coffee or tea                        Coffee or tea  _____________________________________________________________________          BRUSH YOUR TEETH MORNING OF SURGERY AND RINSE YOUR MOUTH OUT, NO CHEWING GUM CANDY OR MINTS.     Take these medicines the morning of surgery with A SIP OF WATER: Wellbutrin, celexa                                 You may not have any metal on your body including hair pins and              piercings  Do not wear jewelry, make-up, lotions, powders,perfumes,      deodorant             Do not wear nail polish on your fingernails or toenails .  Do not shave  48 hours prior to surgery.                Do not bring valuables to the hospital. [facility] IS NOT             RESPONSIBLE   FOR VALUABLES.  Contacts, dentures or bridgework may not be worn into surgery.       Patients discharged the day of surgery will not be allowed to drive home. IF YOU ARE HAVING SURGERY AND GOING HOME THE SAME DAY, YOU MUST HAVE AN ADULT TO DRIVE YOU HOME AND BE  WITH YOU FOR 24 HOURS. YOU MAY GO HOME BY TAXI OR UBER OR ORTHERWISE, BUT AN  ADULT MUST ACCOMPANY YOU HOME AND STAY WITH YOU FOR 24 HOURS.  Name and phone number of your driver:  Special Instructions: N/A              Please read over the following fact sheets you were given: _____________________________________________________________________             [facility] - Preparing for Surgery Before surgery, you can play an important role.  Because skin is not sterile, your skin needs to be as free of germs as possible.  You can reduce the number of germs on your skin by washing with CHG (chlorahexidine gluconate) soap before surgery.  CHG is an antiseptic cleaner which kills germs and bonds with the skin to continue killing germs even after washing. Please DO NOT use if you have an allergy to CHG or antibacterial soaps.  If your skin becomes reddened/irritated stop using the CHG and inform your nurse when you arrive at Short Stay. Do not shave (including legs and underarms) for at least 48 hours prior to the first CHG shower.  You may shave your face/neck. Please follow these instructions carefully:  1.  Shower with CHG Soap the night before surgery and the  morning of Surgery.  2.  If you choose to wash your hair, wash your hair first as usual with your  normal  shampoo.  3.  After you shampoo, rinse your hair and body thoroughly to remove the  shampoo.                           4.  Use CHG as you would any other liquid soap.  You can apply chg directly  to the skin and wash                       Gently with a scrungie or clean washcloth.  5.  Apply the CHG Soap to your body ONLY FROM THE NECK DOWN.   Do not use on face/ open                           Wound or open sores. Avoid contact with eyes, ears mouth and genitals (private parts).                       Wash face,  Genitals (private parts) with your normal soap.             6.  Wash thoroughly, paying special attention to the area where your surgery  will be performed.  7.  Thoroughly rinse your body with warm  water from the neck down.  8.  DO NOT shower/wash with your normal soap after using and rinsing off  the CHG Soap.                9.  Pat yourself dry with a clean towel.            10.  Wear clean pajamas.            11.  Place clean sheets on your bed the night of your first shower and do not  sleep with pets. Day of Surgery : Do not apply any lotions/deodorants the morning of surgery.  Please wear clean clothes to the hospital/surgery center.  FAILURE TO FOLLOW THESE INSTRUCTIONS MAY RESULT IN THE CANCELLATION OF YOUR SURGERY PATIENT SIGNATURE_________________________________  NURSE SIGNATURE__________________________________  ________________________________________________________________________    Rogelia Mire  An incentive spirometer is a tool that can help keep your lungs clear and active. This tool measures how well you are filling your lungs with each breath. Taking long deep breaths may help reverse or decrease the chance of developing breathing (pulmonary) problems (especially infection) following: A long period of time when you are unable to move or be active. BEFORE THE PROCEDURE  If the spirometer includes an indicator to show your best effort, your nurse or respiratory therapist will set it to a desired goal. If possible, sit up straight or lean slightly forward. Try not to slouch. Hold the incentive spirometer in an upright position. INSTRUCTIONS FOR USE  Sit on the edge of your bed if possible, or sit up as far as you can in bed or on a chair. Hold the incentive spirometer in an upright position. Breathe out normally. Place the mouthpiece in your mouth and seal your lips tightly around it. Breathe in slowly and as deeply as possible, raising the piston or the ball toward the top of the column. Hold your breath for 3-5 seconds or for as long as possible. Allow the piston or ball to fall to the bottom of the column. Remove the mouthpiece from your mouth and  breathe out normally. Rest for a few seconds and repeat Steps 1 through 7 at least 10 times every 1-2 hours when you are awake. Take your time and take a few normal breaths between deep breaths. The spirometer may include an indicator to show your best effort. Use the indicator as a goal to work toward during each repetition. After each set of 10 deep breaths, practice coughing to be sure your lungs are clear. If you have an incision (the cut made at the time of surgery), support your incision when coughing by placing a pillow or rolled up towels firmly against it. Once you are able to get out of bed, walk around indoors and cough well. You may stop using the incentive spirometer when instructed by your caregiver.  RISKS AND COMPLICATIONS Take your time so you do not get dizzy or light-headed. If you are in pain, you may need to take or ask for pain medication before doing incentive spirometry. It is harder to take a deep breath if you are having pain. AFTER USE Rest and breathe slowly and easily. It can be helpful to keep track of a log of your progress. Your caregiver can provide you with a simple table to help with this. If you are using the spirometer at home, follow these instructions: SEEK MEDICAL CARE IF:  You are having difficultly using the spirometer. You have trouble using the spirometer as often as instructed. Your pain medication is not giving enough relief while using the spirometer. You develop fever of 100.5 F (38.1 C) or higher. SEEK IMMEDIATE MEDICAL CARE IF:  You cough up bloody sputum that had not been present before. You develop fever of 102 F (38.9 C) or greater. You develop worsening pain at or near the incision site. MAKE SURE YOU:  Understand these instructions. Will watch your condition. Will get help right away if you are not doing well or get worse. Document Released: 03/05/2007 Document Revised: 01/15/2012 Document Reviewed: 05/06/2007 [facility] Patient  Information 2014 [place], [place].   ________________________________________________________________________

## 2021-08-10 ENCOUNTER — Other Ambulatory Visit: Payer: Self-pay

## 2021-08-10 ENCOUNTER — Encounter (HOSPITAL_COMMUNITY)
Admission: RE | Admit: 2021-08-10 | Discharge: 2021-08-10 | Disposition: A | Discharge status: Home / Self Care | Payer: 59 | Source: Ambulatory Visit | Attending: Surgery | Admitting: Surgery

## 2021-08-10 ENCOUNTER — Encounter (HOSPITAL_COMMUNITY): Payer: Self-pay

## 2021-08-10 DIAGNOSIS — Z01812 Encounter for preprocedural laboratory examination: Secondary | ICD-10-CM | POA: Insufficient documentation

## 2021-08-10 HISTORY — DX: Prediabetes: R73.03

## 2021-08-10 HISTORY — DX: Pneumonia, unspecified organism: J18.9

## 2021-08-10 HISTORY — DX: Family history of other specified conditions: Z84.89

## 2021-08-10 LAB — CBC WITH DIFFERENTIAL/PLATELET
Abs Immature Granulocytes: 0.02 10*3/uL (ref 0.00–0.07)
Basophils Absolute: 0 10*3/uL (ref 0.0–0.1)
Basophils Relative: 1 %
Eosinophils Absolute: 0.2 10*3/uL (ref 0.0–0.5)
Eosinophils Relative: 3 %
HCT: 36.4 % (ref 36.0–46.0)
Hemoglobin: 11.2 g/dL — ABNORMAL LOW (ref 12.0–15.0)
Immature Granulocytes: 0 %
Lymphocytes Relative: 29 %
Lymphs Abs: 2 10*3/uL (ref 0.7–4.0)
MCH: 26.1 pg (ref 26.0–34.0)
MCHC: 30.8 g/dL (ref 30.0–36.0)
MCV: 84.8 fL (ref 80.0–100.0)
Monocytes Absolute: 0.4 10*3/uL (ref 0.1–1.0)
Monocytes Relative: 6 %
Neutro Abs: 4.1 10*3/uL (ref 1.7–7.7)
Neutrophils Relative %: 61 %
Platelets: 347 10*3/uL (ref 150–400)
RBC: 4.29 MIL/uL (ref 3.87–5.11)
RDW: 14.6 % (ref 11.5–15.5)
WBC: 6.7 10*3/uL (ref 4.0–10.5)
nRBC: 0 % (ref 0.0–0.2)

## 2021-08-10 LAB — COMPREHENSIVE METABOLIC PANEL
ALT: 13 U/L (ref 0–44)
AST: 19 U/L (ref 15–41)
Albumin: 3.6 g/dL (ref 3.5–5.0)
Alkaline Phosphatase: 77 U/L (ref 38–126)
Anion gap: 10 (ref 5–15)
BUN: 14 mg/dL (ref 6–20)
CO2: 24 mmol/L (ref 22–32)
Calcium: 9.1 mg/dL (ref 8.9–10.3)
Chloride: 108 mmol/L (ref 98–111)
Creatinine, Ser: 0.8 mg/dL (ref 0.44–1.00)
GFR, Estimated: >60 mL/min (ref >60)
Glucose, Bld: 99 mg/dL (ref 70–99)
Potassium: 4.5 mmol/L (ref 3.5–5.1)
Sodium: 142 mmol/L (ref 135–145)
Total Bilirubin: 0.6 mg/dL (ref 0.3–1.2)
Total Protein: 7 g/dL (ref 6.5–8.1)

## 2021-08-10 LAB — HEMOGLOBIN A1C
Hgb A1c MFr Bld: 5.5 % (ref 4.8–5.6)
Mean Plasma Glucose: 111.15 mg/dL

## 2021-08-10 LAB — GLUCOSE, CAPILLARY: Glucose-Capillary: 94 mg/dL (ref 70–99)

## 2021-08-11 ENCOUNTER — Telehealth: Payer: 59 | Admitting: Emergency Medicine

## 2021-08-11 DIAGNOSIS — K047 Periapical abscess without sinus: Secondary | ICD-10-CM | POA: Diagnosis not present

## 2021-08-11 DIAGNOSIS — K0889 Other specified disorders of teeth and supporting structures: Secondary | ICD-10-CM

## 2021-08-11 MED ORDER — AMOXICILLIN 500 MG PO CAPS: 500.0000 mg | ORAL_CAPSULE | Freq: Three times a day (TID) | ORAL | 0 refills | Status: DC

## 2021-08-11 MED ORDER — AMOXICILLIN 400 MG/5ML PO SUSR: 500.0000 mg | Freq: Three times a day (TID) | ORAL | 0 refills | Status: DC

## 2021-08-11 NOTE — Progress Notes (Signed)
I have spent 5 minutes in review of e-visit questionnaire, review and updating patient chart, medical decision making and response to patient.   Mikiya Nebergall, PA-C    

## 2021-08-11 NOTE — Progress Notes (Signed)
E-Visit for Dental Pain  We are sorry that you are not feeling well.  Here is how we plan to help!  Based on what you have shared with me in the questionnaire, it sounds like you have a dental infection.   Amoxicillin 500mg  3 times per day for 10 days  It is imperative that you see a dentist within 10 days of this eVisit to determine the cause of the dental pain and be sure it is adequately treated  A toothache or tooth pain is caused when the nerve in the root of a tooth or surrounding a tooth is irritated. Dental (tooth) infection, decay, injury, or loss of a tooth are the most common causes of dental pain. Pain may also occur after an extraction (tooth is pulled out). Pain sometimes originates from other areas and radiates to the jaw, thus appearing to be tooth pain.Bacteria growing inside your mouth can contribute to gum disease and dental decay, both of which can cause pain. A toothache occurs from inflammation of the central portion of the tooth called pulp. The pulp contains nerve endings that are very sensitive to pain. Inflammation to the pulp or pulpitis may be caused by dental cavities, trauma, and infection.   If you have trouble swallowing or difficulty breathing please call 911 or go to the ED right away!  HOME CARE:   For toothaches: Over-the-counter pain medications such as acetaminophen or ibuprofen may be used. Take these as directed on the package while you arrange for a dental appointment. Avoid very cold or hot foods, because they may make the pain worse. You may get relief from biting on a cotton ball soaked in oil of cloves. You can get oil of cloves at most drug stores.  For jaw pain:  Aspirin may be helpful for problems in the joint of the jaw in adults. If pain happens every time you open your mouth widely, the temporomandibular joint (TMJ) may be the source of the pain. Yawning or taking a large bite of food may worsen the pain. An appointment with your doctor or  dentist will help you find the cause.     GET HELP RIGHT AWAY IF:  You have a high fever or chills If you have had a recent head or face injury and develop headache, light headedness, nausea, vomiting, or other symptoms that concern you after an injury to your face or mouth, you could have a more serious injury in addition to your dental injury. A facial rash associated with a toothache: This condition may improve with medication. Contact your doctor for them to decide what is appropriate. Any jaw pain occurring with chest pain: Although jaw pain is most commonly caused by dental disease, it is sometimes referred pain from other areas. People with heart disease, especially people who have had stents placed, people with diabetes, or those who have had heart surgery may have jaw pain as a symptom of heart attack or angina. If your jaw or tooth pain is associated with lightheadedness, sweating, or shortness of breath, you should see a doctor as soon as possible. Trouble swallowing or excessive pain or bleeding from gums: If you have a history of a weakened immune system, diabetes, or steroid use, you may be more susceptible to infections. Infections can often be more severe and extensive or caused by unusual organisms. Dental and gum infections in people with these conditions may require more aggressive treatment. An abscess may need draining or IV antibiotics, for example.  MAKE SURE YOU   Understand these instructions. Will watch your condition. Will get help right away if you are not doing well or get worse.  Thank you for choosing an e-visit.  Your e-visit answers were reviewed by a board certified advanced clinical practitioner to complete your personal care plan. Depending upon the condition, your plan could have included both over the counter or prescription medications.  Please review your pharmacy choice. Make sure the pharmacy is open so you can pick up prescription now. If there is a  problem, you may contact your provider through [employer] and have the prescription routed to another pharmacy.  Your safety is important to [place]. If you have drug allergies check your prescription carefully.   For the next 24 hours you can use MyChart to ask questions about today's visit, request a non-urgent call back, or ask for a work or school excuse. You will get an email in the next two days asking about your experience. I hope that your e-visit has been valuable and will speed your recovery.

## 2021-08-11 NOTE — Addendum Note (Signed)
Addended by: Rennis Harding on: 08/11/2021 05:30 PM   Modules accepted: Orders

## 2021-08-12 ENCOUNTER — Other Ambulatory Visit: Payer: Self-pay | Admitting: Surgery

## 2021-08-12 LAB — SARS CORONAVIRUS 2 (TAT 6-24 HRS): SARS Coronavirus 2: NEGATIVE

## 2021-08-16 ENCOUNTER — Inpatient Hospital Stay (HOSPITAL_COMMUNITY): Payer: 59 | Admitting: Certified Registered Nurse Anesthetist

## 2021-08-16 ENCOUNTER — Encounter (HOSPITAL_COMMUNITY): Payer: Self-pay | Admitting: Surgery

## 2021-08-16 ENCOUNTER — Observation Stay (HOSPITAL_COMMUNITY)
Admission: RE | Admit: 2021-08-16 | Discharge: 2021-08-17 | Disposition: A | Discharge status: Home / Self Care | Payer: 59 | Attending: Surgery | Admitting: Surgery

## 2021-08-16 ENCOUNTER — Other Ambulatory Visit: Payer: Self-pay

## 2021-08-16 ENCOUNTER — Encounter (HOSPITAL_COMMUNITY)
Admission: RE | Disposition: A | Discharge status: Home / Self Care | Payer: Self-pay | Source: Home / Self Care | Attending: Surgery

## 2021-08-16 DIAGNOSIS — R7303 Prediabetes: Secondary | ICD-10-CM | POA: Diagnosis not present

## 2021-08-16 DIAGNOSIS — Z87440 Personal history of urinary (tract) infections: Secondary | ICD-10-CM | POA: Diagnosis not present

## 2021-08-16 DIAGNOSIS — Z6841 Body Mass Index (BMI) 40.0 and over, adult: Secondary | ICD-10-CM | POA: Diagnosis not present

## 2021-08-16 DIAGNOSIS — K449 Diaphragmatic hernia without obstruction or gangrene: Secondary | ICD-10-CM | POA: Insufficient documentation

## 2021-08-16 DIAGNOSIS — Z8616 Personal history of COVID-19: Secondary | ICD-10-CM | POA: Diagnosis not present

## 2021-08-16 DIAGNOSIS — K219 Gastro-esophageal reflux disease without esophagitis: Secondary | ICD-10-CM | POA: Diagnosis present

## 2021-08-16 DIAGNOSIS — Z888 Allergy status to other drugs, medicaments and biological substances status: Secondary | ICD-10-CM

## 2021-08-16 DIAGNOSIS — Z881 Allergy status to other antibiotic agents status: Secondary | ICD-10-CM

## 2021-08-16 HISTORY — PX: UPPER GI ENDOSCOPY: SHX6162

## 2021-08-16 HISTORY — PX: LAPAROSCOPIC GASTRIC SLEEVE RESECTION: SHX5895

## 2021-08-16 LAB — TYPE AND SCREEN
ABO/RH(D): A POS
Antibody Screen: NEGATIVE

## 2021-08-16 LAB — PREGNANCY, URINE: Preg Test, Ur: NEGATIVE

## 2021-08-16 LAB — GLUCOSE, CAPILLARY: Glucose-Capillary: 91 mg/dL (ref 70–99)

## 2021-08-16 SURGERY — GASTRECTOMY, SLEEVE, LAPAROSCOPIC
Anesthesia: General | Site: Abdomen

## 2021-08-16 MED ORDER — ROCURONIUM BROMIDE 10 MG/ML (PF) SYRINGE
PREFILLED_SYRINGE | INTRAVENOUS | Status: AC
  Filled 2021-08-16: qty 10

## 2021-08-16 MED ORDER — ONDANSETRON HCL 4 MG/2ML IJ SOLN
INTRAMUSCULAR | Status: AC
  Filled 2021-08-16: qty 2

## 2021-08-16 MED ORDER — EPHEDRINE SULFATE-NACL 50-0.9 MG/10ML-% IV SOSY
PREFILLED_SYRINGE | INTRAVENOUS | Status: DC | PRN
  Administered 2021-08-16: 5 mg via INTRAVENOUS

## 2021-08-16 MED ORDER — MIDAZOLAM HCL 5 MG/5ML IJ SOLN
INTRAMUSCULAR | Status: DC | PRN
  Administered 2021-08-16: 2 mg via INTRAVENOUS

## 2021-08-16 MED ORDER — LIDOCAINE HCL 2 % IJ SOLN
INTRAMUSCULAR | Status: AC
  Filled 2021-08-16: qty 20

## 2021-08-16 MED ORDER — HYDROMORPHONE HCL 1 MG/ML IJ SOLN
INTRAMUSCULAR | Status: AC
  Filled 2021-08-16: qty 2

## 2021-08-16 MED ORDER — SUGAMMADEX SODIUM 200 MG/2ML IV SOLN
INTRAVENOUS | Status: DC | PRN
  Administered 2021-08-16: 200 mg via INTRAVENOUS

## 2021-08-16 MED ORDER — METOCLOPRAMIDE HCL 5 MG/ML IJ SOLN
10.0000 mg | Freq: Three times a day (TID) | INTRAMUSCULAR | Status: DC
  Administered 2021-08-16 – 2021-08-17 (×3): 10 mg via INTRAVENOUS
  Filled 2021-08-16 (×3): qty 2

## 2021-08-16 MED ORDER — GABAPENTIN 300 MG PO CAPS
300.0000 mg | ORAL_CAPSULE | ORAL | Status: AC
  Administered 2021-08-16: 300 mg via ORAL
  Filled 2021-08-16: qty 1

## 2021-08-16 MED ORDER — ACETAMINOPHEN 500 MG PO TABS
1000.0000 mg | ORAL_TABLET | ORAL | Status: AC
  Administered 2021-08-16: 1000 mg via ORAL
  Filled 2021-08-16: qty 2

## 2021-08-16 MED ORDER — DOCUSATE SODIUM 100 MG PO CAPS
100.0000 mg | ORAL_CAPSULE | Freq: Two times a day (BID) | ORAL | Status: DC
  Administered 2021-08-16 – 2021-08-17 (×2): 100 mg via ORAL
  Filled 2021-08-16 (×2): qty 1

## 2021-08-16 MED ORDER — DEXAMETHASONE SODIUM PHOSPHATE 10 MG/ML IJ SOLN
INTRAMUSCULAR | Status: AC
  Filled 2021-08-16: qty 1

## 2021-08-16 MED ORDER — OXYCODONE HCL 5 MG/5ML PO SOLN: 5.0000 mg | Freq: Once | ORAL | Status: DC | PRN

## 2021-08-16 MED ORDER — PROPOFOL 10 MG/ML IV BOLUS
INTRAVENOUS | Status: DC | PRN
  Administered 2021-08-16: 200 mg via INTRAVENOUS

## 2021-08-16 MED ORDER — HYDROMORPHONE HCL 1 MG/ML IJ SOLN: 0.5000 mg | INTRAMUSCULAR | Status: DC | PRN

## 2021-08-16 MED ORDER — ONDANSETRON HCL 4 MG/2ML IJ SOLN
4.0000 mg | Freq: Once | INTRAMUSCULAR | Status: AC | PRN
  Administered 2021-08-16: 4 mg via INTRAVENOUS

## 2021-08-16 MED ORDER — CHLORHEXIDINE GLUCONATE 0.12 % MT SOLN
15.0000 mL | Freq: Once | OROMUCOSAL | Status: AC
  Administered 2021-08-16: 15 mL via OROMUCOSAL

## 2021-08-16 MED ORDER — ACETAMINOPHEN 160 MG/5ML PO SOLN
1000.0000 mg | Freq: Three times a day (TID) | ORAL | Status: DC
  Administered 2021-08-16 – 2021-08-17 (×3): 1000 mg via ORAL
  Filled 2021-08-16 (×3): qty 40.6

## 2021-08-16 MED ORDER — HYDROMORPHONE HCL 1 MG/ML IJ SOLN
0.2500 mg | INTRAMUSCULAR | Status: DC | PRN
  Administered 2021-08-16 (×3): 0.5 mg via INTRAVENOUS

## 2021-08-16 MED ORDER — BUPIVACAINE LIPOSOME 1.3 % IJ SUSP: 20.0000 mL | Freq: Once | INTRAMUSCULAR | Status: DC

## 2021-08-16 MED ORDER — ACETAMINOPHEN 500 MG PO TABS: 1000.0000 mg | ORAL_TABLET | Freq: Three times a day (TID) | ORAL | Status: DC

## 2021-08-16 MED ORDER — STERILE WATER FOR IRRIGATION IR SOLN
Status: DC | PRN
  Administered 2021-08-16: 1000 mL

## 2021-08-16 MED ORDER — SCOPOLAMINE 1 MG/3DAYS TD PT72
1.0000 | MEDICATED_PATCH | TRANSDERMAL | Status: DC
  Filled 2021-08-16: qty 1

## 2021-08-16 MED ORDER — CHLORHEXIDINE GLUCONATE 4 % EX LIQD: 60.0000 mL | Freq: Once | CUTANEOUS | Status: DC

## 2021-08-16 MED ORDER — LACTATED RINGERS IR SOLN
Status: DC | PRN
  Administered 2021-08-16: 1000 mL

## 2021-08-16 MED ORDER — KETAMINE HCL 10 MG/ML IJ SOLN
INTRAMUSCULAR | Status: DC | PRN
  Administered 2021-08-16: 30 mg via INTRAVENOUS

## 2021-08-16 MED ORDER — GABAPENTIN 100 MG PO CAPS
200.0000 mg | ORAL_CAPSULE | Freq: Two times a day (BID) | ORAL | Status: DC
  Administered 2021-08-16 – 2021-08-17 (×2): 200 mg via ORAL
  Filled 2021-08-16 (×2): qty 2

## 2021-08-16 MED ORDER — TRAMADOL HCL 50 MG PO TABS: 50.0000 mg | ORAL_TABLET | Freq: Four times a day (QID) | ORAL | Status: DC | PRN

## 2021-08-16 MED ORDER — BUPIVACAINE-EPINEPHRINE (PF) 0.25% -1:200000 IJ SOLN
INTRAMUSCULAR | Status: AC
  Filled 2021-08-16: qty 30

## 2021-08-16 MED ORDER — ENOXAPARIN SODIUM 30 MG/0.3ML IJ SOSY
30.0000 mg | PREFILLED_SYRINGE | Freq: Two times a day (BID) | INTRAMUSCULAR | Status: DC
  Administered 2021-08-16 – 2021-08-17 (×2): 30 mg via SUBCUTANEOUS
  Filled 2021-08-16 (×2): qty 0.3

## 2021-08-16 MED ORDER — SODIUM CHLORIDE 0.9 % IV SOLN
2.0000 g | INTRAVENOUS | Status: DC
  Filled 2021-08-16: qty 2

## 2021-08-16 MED ORDER — FENTANYL CITRATE (PF) 100 MCG/2ML IJ SOLN
INTRAMUSCULAR | Status: DC | PRN
  Administered 2021-08-16 (×2): 50 ug via INTRAVENOUS

## 2021-08-16 MED ORDER — OXYCODONE HCL 5 MG/5ML PO SOLN: 5.0000 mg | Freq: Four times a day (QID) | ORAL | Status: DC | PRN

## 2021-08-16 MED ORDER — ENSURE MAX PROTEIN PO LIQD
2.0000 [oz_av] | ORAL | Status: DC
  Administered 2021-08-17 (×3): 2 [oz_av] via ORAL

## 2021-08-16 MED ORDER — MIDAZOLAM HCL 2 MG/2ML IJ SOLN
INTRAMUSCULAR | Status: AC
  Filled 2021-08-16: qty 2

## 2021-08-16 MED ORDER — DEXAMETHASONE SODIUM PHOSPHATE 10 MG/ML IJ SOLN
INTRAMUSCULAR | Status: DC | PRN
  Administered 2021-08-16: 10 mg via INTRAVENOUS

## 2021-08-16 MED ORDER — PHENYLEPHRINE 40 MCG/ML (10ML) SYRINGE FOR IV PUSH (FOR BLOOD PRESSURE SUPPORT)
PREFILLED_SYRINGE | INTRAVENOUS | Status: DC | PRN
  Administered 2021-08-16: 80 ug via INTRAVENOUS

## 2021-08-16 MED ORDER — ORAL CARE MOUTH RINSE: 15.0000 mL | Freq: Once | OROMUCOSAL | Status: AC

## 2021-08-16 MED ORDER — APREPITANT 40 MG PO CAPS
40.0000 mg | ORAL_CAPSULE | ORAL | Status: AC
  Administered 2021-08-16: 40 mg via ORAL
  Filled 2021-08-16: qty 1

## 2021-08-16 MED ORDER — ONDANSETRON HCL 4 MG/2ML IJ SOLN
INTRAMUSCULAR | Status: DC | PRN
  Administered 2021-08-16: 4 mg via INTRAVENOUS

## 2021-08-16 MED ORDER — HYDRALAZINE HCL 20 MG/ML IJ SOLN: 10.0000 mg | INTRAMUSCULAR | Status: DC | PRN

## 2021-08-16 MED ORDER — 0.9 % SODIUM CHLORIDE (POUR BTL) OPTIME
TOPICAL | Status: DC | PRN
  Administered 2021-08-16: 1000 mL

## 2021-08-16 MED ORDER — BUPIVACAINE-EPINEPHRINE 0.25% -1:200000 IJ SOLN
INTRAMUSCULAR | Status: DC | PRN
  Administered 2021-08-16: 30 mL

## 2021-08-16 MED ORDER — CITALOPRAM HYDROBROMIDE 20 MG PO TABS
20.0000 mg | ORAL_TABLET | Freq: Every day | ORAL | Status: DC
  Administered 2021-08-16 – 2021-08-17 (×2): 20 mg via ORAL
  Filled 2021-08-16 (×2): qty 1

## 2021-08-16 MED ORDER — BUPROPION HCL ER (SR) 150 MG PO TB12
150.0000 mg | ORAL_TABLET | Freq: Every day | ORAL | Status: DC
  Administered 2021-08-16: 150 mg via ORAL
  Filled 2021-08-16 (×2): qty 1

## 2021-08-16 MED ORDER — DEXMEDETOMIDINE (PRECEDEX) IN NS 20 MCG/5ML (4 MCG/ML) IV SYRINGE
PREFILLED_SYRINGE | INTRAVENOUS | Status: DC | PRN
  Administered 2021-08-16: 12 ug via INTRAVENOUS

## 2021-08-16 MED ORDER — LIDOCAINE 2% (20 MG/ML) 5 ML SYRINGE
INTRAMUSCULAR | Status: DC | PRN
  Administered 2021-08-16: 60 mg via INTRAVENOUS

## 2021-08-16 MED ORDER — PROPOFOL 10 MG/ML IV BOLUS
INTRAVENOUS | Status: AC
  Filled 2021-08-16: qty 20

## 2021-08-16 MED ORDER — LACTATED RINGERS IV SOLN: INTRAVENOUS | Status: DC

## 2021-08-16 MED ORDER — PANTOPRAZOLE SODIUM 40 MG IV SOLR
40.0000 mg | Freq: Every day | INTRAVENOUS | Status: DC
  Administered 2021-08-16: 40 mg via INTRAVENOUS
  Filled 2021-08-16: qty 40

## 2021-08-16 MED ORDER — KETOROLAC TROMETHAMINE 15 MG/ML IJ SOLN: 15.0000 mg | Freq: Three times a day (TID) | INTRAMUSCULAR | Status: DC | PRN

## 2021-08-16 MED ORDER — FENTANYL CITRATE (PF) 100 MCG/2ML IJ SOLN
INTRAMUSCULAR | Status: AC
  Filled 2021-08-16: qty 2

## 2021-08-16 MED ORDER — METHOCARBAMOL 1000 MG/10ML IJ SOLN
500.0000 mg | Freq: Four times a day (QID) | INTRAVENOUS | Status: DC | PRN
  Filled 2021-08-16: qty 5

## 2021-08-16 MED ORDER — METOPROLOL TARTRATE 5 MG/5ML IV SOLN
5.0000 mg | Freq: Four times a day (QID) | INTRAVENOUS | Status: DC | PRN
Start: 2021-08-16 — End: 2021-08-17

## 2021-08-16 MED ORDER — BUPIVACAINE LIPOSOME 1.3 % IJ SUSP
INTRAMUSCULAR | Status: AC
  Filled 2021-08-16: qty 20

## 2021-08-16 MED ORDER — ONDANSETRON HCL 4 MG/2ML IJ SOLN: 4.0000 mg | INTRAMUSCULAR | Status: DC | PRN

## 2021-08-16 MED ORDER — OXYCODONE HCL 5 MG PO TABS: 5.0000 mg | ORAL_TABLET | Freq: Once | ORAL | Status: DC | PRN

## 2021-08-16 MED ORDER — KETAMINE HCL 10 MG/ML IJ SOLN
INTRAMUSCULAR | Status: AC
  Filled 2021-08-16: qty 1

## 2021-08-16 MED ORDER — SCOPOLAMINE 1 MG/3DAYS TD PT72
1.0000 | MEDICATED_PATCH | TRANSDERMAL | Status: DC
  Administered 2021-08-16: 1.5 mg via TRANSDERMAL

## 2021-08-16 MED ORDER — SIMETHICONE 80 MG PO CHEW
80.0000 mg | CHEWABLE_TABLET | Freq: Four times a day (QID) | ORAL | Status: DC | PRN
  Administered 2021-08-17: 80 mg via ORAL
  Filled 2021-08-16: qty 1

## 2021-08-16 MED ORDER — BUPIVACAINE LIPOSOME 1.3 % IJ SUSP
INTRAMUSCULAR | Status: DC | PRN
  Administered 2021-08-16: 20 mL

## 2021-08-16 MED ORDER — SODIUM CHLORIDE 0.9 % IV SOLN: INTRAVENOUS | Status: DC

## 2021-08-16 MED ORDER — HEPARIN SODIUM (PORCINE) 5000 UNIT/ML IJ SOLN
5000.0000 [IU] | INTRAMUSCULAR | Status: AC
  Administered 2021-08-16: 5000 [IU] via SUBCUTANEOUS
  Filled 2021-08-16: qty 1

## 2021-08-16 MED ORDER — ROCURONIUM BROMIDE 10 MG/ML (PF) SYRINGE
PREFILLED_SYRINGE | INTRAVENOUS | Status: DC | PRN
  Administered 2021-08-16: 70 mg via INTRAVENOUS

## 2021-08-16 SURGICAL SUPPLY — 76 items
APL PRP STRL LF DISP 70% ISPRP (MISCELLANEOUS) ×4
APL SKNCLS STERI-STRIP NONHPOA (GAUZE/BANDAGES/DRESSINGS) ×2
APPLIER CLIP ROT 10 11.4 M/L (STAPLE)
APPLIER CLIP ROT 13.4 12 LRG (CLIP)
APR CLP LRG 13.4X12 ROT 20 MLT (CLIP)
APR CLP MED LRG 11.4X10 (STAPLE)
BAG COUNTER SPONGE SURGICOUNT (BAG) IMPLANT
BAG LAPAROSCOPIC 12 15 PORT 16 (BASKET) IMPLANT
BAG RETRIEVAL 12/15 (BASKET)
BAG SPNG CNTER NS LX DISP (BAG)
BENZOIN TINCTURE PRP APPL 2/3 (GAUZE/BANDAGES/DRESSINGS) ×3 IMPLANT
BLADE SURG SZ11 CARB STEEL (BLADE) ×3 IMPLANT
BNDG ADH 1X3 SHEER STRL LF (GAUZE/BANDAGES/DRESSINGS) ×18 IMPLANT
BNDG ADH THN 3X1 STRL LF (GAUZE/BANDAGES/DRESSINGS) ×12
CABLE HIGH FREQUENCY MONO STRZ (ELECTRODE) ×2 IMPLANT
CHLORAPREP W/TINT 26 (MISCELLANEOUS) ×6 IMPLANT
CLIP APPLIE ROT 10 11.4 M/L (STAPLE) IMPLANT
CLIP APPLIE ROT 13.4 12 LRG (CLIP) IMPLANT
COVER SURGICAL LIGHT HANDLE (MISCELLANEOUS) ×3 IMPLANT
DECANTER SPIKE VIAL GLASS SM (MISCELLANEOUS) ×3 IMPLANT
DEVICE SUT QUICK LOAD TK 5 (STAPLE) IMPLANT
DEVICE SUT TI-KNOT TK 5X26 (MISCELLANEOUS) IMPLANT
DRAPE UTILITY XL STRL (DRAPES) ×6 IMPLANT
ELECT REM PT RETURN 15FT ADLT (MISCELLANEOUS) ×3 IMPLANT
GAUZE SPONGE 4X4 12PLY STRL (GAUZE/BANDAGES/DRESSINGS) IMPLANT
GLOVE SURG ENC MOIS LTX SZ6 (GLOVE) ×3 IMPLANT
GLOVE SURG MICRO LTX SZ6 (GLOVE) ×3 IMPLANT
GLOVE SURG UNDER LTX SZ6.5 (GLOVE) ×3 IMPLANT
GOWN STRL REUS W/TWL LRG LVL3 (GOWN DISPOSABLE) ×3 IMPLANT
GOWN STRL REUS W/TWL XL LVL3 (GOWN DISPOSABLE) ×6 IMPLANT
GRASPER SUT TROCAR 14GX15 (MISCELLANEOUS) ×3 IMPLANT
IRRIG SUCT STRYKERFLOW 2 WTIP (MISCELLANEOUS) ×3
IRRIGATION SUCT STRKRFLW 2 WTP (MISCELLANEOUS) ×2 IMPLANT
KIT BASIN OR (CUSTOM PROCEDURE TRAY) ×3 IMPLANT
KIT TURNOVER KIT A (KITS) ×3 IMPLANT
MARKER SKIN DUAL TIP RULER LAB (MISCELLANEOUS) ×3 IMPLANT
MAT PREVALON FULL STRYKER (MISCELLANEOUS) ×3 IMPLANT
NDL SPNL 22GX3.5 QUINCKE BK (NEEDLE) ×2 IMPLANT
NEEDLE SPNL 22GX3.5 QUINCKE BK (NEEDLE) ×3 IMPLANT
PACK UNIVERSAL I (CUSTOM PROCEDURE TRAY) ×3 IMPLANT
RELOAD ENDO STITCH (ENDOMECHANICALS) IMPLANT
RELOAD STAPLE 60 3.6 BLU REG (STAPLE) ×2 IMPLANT
RELOAD STAPLE 60 3.8 GOLD REG (STAPLE) ×2 IMPLANT
RELOAD STAPLE 60 4.1 GRN THCK (STAPLE) IMPLANT
RELOAD STAPLER BLUE 60MM (STAPLE) ×2 IMPLANT
RELOAD STAPLER GOLD 60MM (STAPLE) ×2 IMPLANT
RELOAD STAPLER GREEN 60MM (STAPLE) ×2 IMPLANT
RELOAD SUT TRIPLE-STITCH 2-0 (ENDOMECHANICALS) IMPLANT
SCISSORS LAP 5X45 EPIX DISP (ENDOMECHANICALS) ×3 IMPLANT
SET TUBE SMOKE EVAC HIGH FLOW (TUBING) ×3 IMPLANT
SHEARS HARMONIC ACE PLUS 45CM (MISCELLANEOUS) ×3 IMPLANT
SLEEVE ADV FIXATION 5X100MM (TROCAR) ×6 IMPLANT
SLEEVE GASTRECTOMY 40FR VISIGI (MISCELLANEOUS) ×3 IMPLANT
SOL ANTI FOG 6CC (MISCELLANEOUS) ×2 IMPLANT
SOLUTION ANTI FOG 6CC (MISCELLANEOUS) ×1
SPONGE T-LAP 18X18 ~~LOC~~+RFID (SPONGE) ×3 IMPLANT
STAPLE LINE REINFORCEMENT LAP (STAPLE) ×3 IMPLANT
STAPLER ECHELON BIOABSB 60 FLE (MISCELLANEOUS) IMPLANT
STAPLER ECHELON LONG 60 440 (INSTRUMENTS) ×3 IMPLANT
STAPLER RELOAD BLUE 60MM (STAPLE) ×3
STAPLER RELOAD GOLD 60MM (STAPLE) ×3
STAPLER RELOAD GREEN 60MM (STAPLE) ×3
STRIP CLOSURE SKIN 1/2X4 (GAUZE/BANDAGES/DRESSINGS) ×3 IMPLANT
SUT MNCRL AB 4-0 PS2 18 (SUTURE) ×3 IMPLANT
SUT SURGIDAC NAB ES-9 0 48 120 (SUTURE) IMPLANT
SUT VICRYL 0 TIES 12 18 (SUTURE) ×3 IMPLANT
SYR 10ML ECCENTRIC (SYRINGE) ×3 IMPLANT
SYR 20ML LL LF (SYRINGE) ×3 IMPLANT
SYR 50ML LL SCALE MARK (SYRINGE) ×3 IMPLANT
TOWEL OR 17X26 10 PK STRL BLUE (TOWEL DISPOSABLE) ×3 IMPLANT
TOWEL OR NON WOVEN STRL DISP B (DISPOSABLE) ×3 IMPLANT
TROCAR ADV FIXATION 5X100MM (TROCAR) ×3 IMPLANT
TROCAR BLADELESS 15MM (ENDOMECHANICALS) ×3 IMPLANT
TROCAR BLADELESS OPT 5 100 (ENDOMECHANICALS) ×3 IMPLANT
TUBING CONNECTING 10 (TUBING) ×3 IMPLANT
TUBING ENDO SMARTCAP (MISCELLANEOUS) ×3 IMPLANT

## 2021-08-16 NOTE — Transfer of Care (Signed)
Immediate Anesthesia Transfer of Care Note  Patient: Julie Mcneil  Procedure(s) Performed: Procedure(s): LAPAROSCOPIC GASTRIC SLEEVE RESECTION (N/A) UPPER GI ENDOSCOPY (N/A)  Patient Location: PACU  Anesthesia Type:General  Level of Consciousness: Alert, Awake, Oriented  Airway & Oxygen Therapy: Patient Spontanous Breathing  Post-op Assessment: Report given to RN  Post vital signs: Reviewed and stable  Last Vitals:  Vitals:   08/16/21 1303  BP: (!) 184/108  Pulse: 100  Resp: 16  Temp: 36.8 C  SpO2: 99%    Complications: No apparent anesthesia complications

## 2021-08-16 NOTE — Progress Notes (Signed)
Water started. 

## 2021-08-16 NOTE — Interval H&P Note (Signed)
History and Physical Interval Note:  08/16/2021 1:52 PM  Julie Mcneil  has presented today for surgery, with the diagnosis of MORBID OBESITY.  The various methods of treatment have been discussed with the patient and family. After consideration of risks, benefits and other options for treatment, the patient has consented to  Procedure(s): LAPAROSCOPIC GASTRIC SLEEVE RESECTION (N/A) UPPER GI ENDOSCOPY (N/A) as a surgical intervention.  The patient's history has been reviewed, patient examined, no change in status, stable for surgery.  I have reviewed the patient's chart and labs.  Questions were answered to the patient's satisfaction.     Tabathia Knoche Lollie Sails

## 2021-08-16 NOTE — Op Note (Signed)
Operative Note  Julie Mcneil  [id number]  [id number]  08/16/2021   Surgeon: Romana Juniper MD   Assistant: Louanna Raw MD   Procedure performed: laparoscopic sleeve gastrectomy, upper endoscopy   Preop diagnosis: Morbid obesity Body mass index is 46.52 kg/m. Post-op diagnosis/intraop findings: same   Specimens: fundus Retained items: none  EBL: minimal  Complications: none   Description of procedure: After obtaining informed consent and administration of chemical DVT prophylaxis in holding, the patient was taken to the operating room and placed supine on operating room table where general endotracheal anesthesia was initiated, preoperative antibiotics were administered, SCDs applied, and a formal timeout was performed. The abdomen was prepped and draped in usual sterile fashion. Peritoneal access was gained using a Visiport technique in the left upper quadrant and insufflation to 15 mmHg ensued without issue. Gross inspection revealed no evidence of injury.  Under direct visualization three more 5 mm trochars were placed in the right and left hemiabdomen and the 88m trocar in the right paramedian upper abdomen. Bilateral laparoscopic assisted TAPS blocks were performed with Exparel diluted with 0.25 percent Marcaine with epinephrine. The patient was placed in steep Trendelenburg and the liver retractor was introduced through an incision in the upper midline and secured to the post externally to maintain the left lobe retracted anteriorly.  There was no visible hiatal hernia present.  The calibration tubing was passed down by the anesthesiologist and the balloon inflated to 10 mL.  This was then gently retracted by the anesthetist and met resistance at the hiatus, unable to be pulled through.  The balloon was deflated and the tube removed and discarded.   Using the Harmonic scalpel, the greater curvature of the stomach was dissected away from the greater omentum and short gastric  vessels were divided. This began 6 cm from the pylorus, and dissection proceeded until the left crus was clearly exposed. There were some filmy adhesions of the posterior stomach to the pancreas which were divided with the Harmonic. The [id number] was then introduced and directed down towards the pylorus. This was placed to suction against the lesser curve. Serial fires of the linear cutting stapler with seam guards on the first four fires were then employed to create our sleeve. The first fire used a green load and ensured adequate room at the angularis incisura. One gold load and then several blue loads were then employed to create a narrow tubular stomach up to the angle of His. The excised stomach was then removed through our 15 mm trocar site within an Endo Catch bag.  The visigi was taken off of suction and a few puffs of air were introduced, inflating the sleeve. No bubbles were observed in the irrigation fluid around the stomach and the shape was noted to be evenly tubular without any narrowing at the angularis. The visigi was then removed. Upper endoscopy was performed by the assistant surgeon and the sleeve was noted to be airtight, the staple line was hemostatic. Please see his separate note. The endoscope was removed. The 15 mm trocar site fascia in the right upper abdomen was closed with a 0 Vicryl using the laparoscopic suture passer under direct visualization. The liver retractor was removed under direct visualization. The abdomen was then desufflated and all remaining trochars removed. The skin incisions were closed with subcuticular Monocryl; benzoin, Steri-Strips and Band-Aids were applied The patient was then awakened, extubated and taken to PACU in stable condition.     All counts were correct  at the completion of the case.

## 2021-08-16 NOTE — Anesthesia Postprocedure Evaluation (Signed)
Anesthesia Post Note  Patient: Julie Mcneil  Procedure(s) Performed: LAPAROSCOPIC GASTRIC SLEEVE RESECTION (Abdomen) UPPER GI ENDOSCOPY     Patient location during evaluation: PACU Anesthesia Type: General Level of consciousness: awake and alert and oriented Pain management: pain level controlled Vital Signs Assessment: post-procedure vital signs reviewed and stable Respiratory status: spontaneous breathing, nonlabored ventilation and respiratory function stable Cardiovascular status: blood pressure returned to baseline and stable Anesthetic complications: no Comments: Had some PON in PACU.   No notable events documented.  Last Vitals:  Vitals:   08/16/21 1620 08/16/21 1630  BP:  139/82  Pulse: 87 86  Resp: 15 17  Temp:  36.4 C  SpO2: 100% 100%    Last Pain:  Vitals:   08/16/21 1630  TempSrc:   PainSc: 6                  Dallis Czaja A.

## 2021-08-16 NOTE — Progress Notes (Signed)
PHARMACY CONSULT FOR:  Risk Assessment for Post-Discharge VTE Following Bariatric Surgery  Post-Discharge VTE Risk Assessment: This patient's probability of 30-day post-discharge VTE is increased due to the factors marked:   Female    Age >/=60 years    BMI >/=50 kg/m2    CHF    Dyspnea at Rest    Paraplegia   x Non-gastric-band surgery    Operation Time >/=3 hr    Return to OR     Length of Stay >/= 3 d   Hx of VTE   Hypercoagulable condition   Significant venous stasis    Predicted probability of 30-day post-discharge VTE: 0.16% mild  Other patient-specific factors to consider:   Recommendation for Discharge: No pharmacologic prophylaxis post-discharge   Julie Mcneil is a 40 y.o. female who underwent  laparoscopic sleeve gastrectomy, upper endoscopy on 08/16/2021   Case start: 1453 Case end: 1541   Allergies  Allergen Reactions   Sumatriptan Shortness Of Breath    Tight chest   Levaquin [Levofloxacin In D5w] Nausea Only   Nitrofurantoin Monohyd Macro Hives and Nausea And Vomiting    Patient Measurements: Weight: 122.9 kg (271 lb) Body mass index is 46.52 kg/m.  No results for input(s): WBC, HGB, HCT, PLT, APTT, CREATININE, LABCREA, CREATININE, CREAT24HRUR, MG, PHOS, ALBUMIN, PROT, ALBUMIN, AST, ALT, ALKPHOS, BILITOT, BILIDIR, IBILI in the last 72 hours. Estimated Creatinine Clearance: 122.2 mL/min (by C-G formula based on SCr of 0.8 mg/dL).    Past Medical History:  Diagnosis Date   Anemia    Anxiety    Panic attacks   Cervical dysplasia    Family history of adverse reaction to anesthesia    Sister BP drops   GERD (gastroesophageal reflux disease)    occasional   Obesity    Palpitations    Pneumonia    Pre-diabetes    UTI (urinary tract infection)    recurrent   Vaginal yeast infection    recurrent   Vertigo      Medications Prior to Admission  Medication Sig Dispense Refill Last Dose   amoxicillin (AMOXIL) 400 MG/5ML suspension Take 6.3  mLs (500 mg total) by mouth 3 (three) times daily for 10 days. 195 mL 0 08/15/2021   buPROPion (WELLBUTRIN SR) 150 MG 12 hr tablet Take 1 tablet (150 mg total) by mouth daily. 30 tablet 0 08/15/2021   citalopram (CELEXA) 20 MG tablet Take 1 tablet (20 mg total) by mouth daily. 90 tablet 0 08/15/2021   Ferrous Sulfate (IRON PO) Take 1 tablet by mouth 2 (two) times a week.   Past Week   Vitamin D, Ergocalciferol, (DRISDOL) 1.25 MG (50000 UNIT) CAPS capsule Take 1 capsule (50,000 Units total) by mouth every 7 (seven) days. (Patient taking differently: Take 50,000 Units by mouth every 14 (fourteen) days.) 4 capsule 0 Past Week   nystatin-triamcinolone ointment (MYCOLOG) Apply 1 application topically 2 (two) times daily. (Patient not taking: Reported on 08/04/2021) 30 g 1 Not Taking    Loralee Pacas, PharmD, BCPS Pharmacy: [phone] 08/16/2021,3:52 PM

## 2021-08-16 NOTE — Anesthesia Procedure Notes (Signed)
Procedure Name: Intubation Date/Time: 08/16/2021 2:38 PM Performed by: Gerald Leitz, CRNA Pre-anesthesia Checklist: Patient identified, Patient being monitored, Timeout performed, Emergency Drugs available and Suction available Patient Re-evaluated:Patient Re-evaluated prior to induction Oxygen Delivery Method: Circle system utilized Preoxygenation: Pre-oxygenation with 100% oxygen Induction Type: IV induction Ventilation: Mask ventilation without difficulty Laryngoscope Size: Mac and 3 Grade View: Grade I Tube type: Oral Tube size: 7.5 mm Number of attempts: 1 Airway Equipment and Method: Stylet Placement Confirmation: ETT inserted through vocal cords under direct vision, positive ETCO2 and breath sounds checked- equal and bilateral Secured at: 21 cm Tube secured with: Tape Dental Injury: Teeth and Oropharynx as per pre-operative assessment

## 2021-08-16 NOTE — Anesthesia Preprocedure Evaluation (Addendum)
Anesthesia Evaluation  Patient identified by MRN, date of birth, ID band Patient awake    Reviewed: Allergy & Precautions, NPO status , Patient's Chart, lab work & pertinent test results  History of Anesthesia Complications (+) Family history of anesthesia reaction  Airway Mallampati: I  TM Distance: >3 FB Neck ROM: Full   Comment: Hypertrophied tonsils Dental no notable dental hx. (+) Teeth Intact, Dental Advisory Given, Chipped,    Pulmonary pneumonia, resolved,    Pulmonary exam normal breath sounds clear to auscultation       Cardiovascular Normal cardiovascular exam Rhythm:Regular Rate:Normal     Neuro/Psych  Headaches, PSYCHIATRIC DISORDERS Anxiety Hx/o panic attacks   GI/Hepatic Neg liver ROS, GERD  Medicated,  Endo/Other  Morbid obesityPre Diabetes  Renal/GU negative Renal ROS  negative genitourinary   Musculoskeletal negative musculoskeletal ROS (+)   Abdominal (+) + obese,   Peds  Hematology  (+) anemia ,   Anesthesia Other Findings   Reproductive/Obstetrics                            Anesthesia Physical Anesthesia Plan  ASA: 3  Anesthesia Plan: General   Post-op Pain Management:    Induction: Intravenous  PONV Risk Score and Plan: 4 or greater and Treatment may vary due to age or medical condition and Ondansetron  Airway Management Planned: Oral ETT  Additional Equipment:   Intra-op Plan:   Post-operative Plan: Extubation in OR  Informed Consent: I have reviewed the patients History and Physical, chart, labs and discussed the procedure including the risks, benefits and alternatives for the proposed anesthesia with the patient or authorized representative who has indicated his/her understanding and acceptance.     Dental advisory given  Plan Discussed with: CRNA and Anesthesiologist  Anesthesia Plan Comments:         Anesthesia Quick Evaluation

## 2021-08-16 NOTE — Op Note (Signed)
   Patient: Julie Mcneil (Jun 27, 1981, [id number])  Date of Surgery: 08/16/2021   Preoperative Diagnosis: MORBID OBESITY   Postoperative Diagnosis: MORBID OBESITY   Surgical Procedure: Upper Endoscopy   Surgeon: Ivar Drape, MD  Anesthesiologist: Mal Amabile, MD CRNA: Basilio Cairo, CRNA   Anesthesia: General   Fluids:  No intake/output data recorded.  Complications: None  Drains:  None  Specimen: None   Indications for Procedure: TONGA PROUT is a 40 y.o. female undergoing laparoscopic sleeve gastrectomy and an EGD was requested to evaluate foregut anatomy intraoperatively.  Description of Procedure: During the procedure, I scrubbed out and obtained the Olympus endoscope. I gently placed endoscope in the patient's oropharynx and gently glided it down the esophagus without any difficulty under direct visualization.  The scope was advanced as far as the pylorus and then slowly withdrawn to inspect the foregut anatomy.  Dr. Fredricka Bonine had placed saline in the upper abdomen and all staple lines were submerged to ensure no air leak. There was no evidence of bubbles. There was no evidence of intraluminal bleeding and the mucosa appeared healthy.  The lumen was widely patent without evidence of stricture.  The intraluminal insufflation was decompressed. The scope was withdrawn. The patient tolerated this portion of the procedure well. Please see Dr Derrill Memo operative note for details regarding the remainder of the procedure.    Ivar Drape, MD General, Bariatric, & Minimally Invasive Surgery [facility], [place]

## 2021-08-17 ENCOUNTER — Encounter (HOSPITAL_COMMUNITY): Payer: Self-pay | Admitting: Surgery

## 2021-08-17 ENCOUNTER — Other Ambulatory Visit (HOSPITAL_COMMUNITY): Payer: Self-pay

## 2021-08-17 LAB — CBC WITH DIFFERENTIAL/PLATELET
Abs Immature Granulocytes: 0.06 10*3/uL (ref 0.00–0.07)
Basophils Absolute: 0 10*3/uL (ref 0.0–0.1)
Basophils Relative: 0 %
Eosinophils Absolute: 0 10*3/uL (ref 0.0–0.5)
Eosinophils Relative: 0 %
HCT: 31.8 % — ABNORMAL LOW (ref 36.0–46.0)
Hemoglobin: 10.2 g/dL — ABNORMAL LOW (ref 12.0–15.0)
Immature Granulocytes: 1 %
Lymphocytes Relative: 13 %
Lymphs Abs: 1.5 10*3/uL (ref 0.7–4.0)
MCH: 26.6 pg (ref 26.0–34.0)
MCHC: 32.1 g/dL (ref 30.0–36.0)
MCV: 83 fL (ref 80.0–100.0)
Monocytes Absolute: 0.3 10*3/uL (ref 0.1–1.0)
Monocytes Relative: 3 %
Neutro Abs: 9.9 10*3/uL — ABNORMAL HIGH (ref 1.7–7.7)
Neutrophils Relative %: 83 %
Platelets: 380 10*3/uL (ref 150–400)
RBC: 3.83 MIL/uL — ABNORMAL LOW (ref 3.87–5.11)
RDW: 14.4 % (ref 11.5–15.5)
WBC: 11.8 10*3/uL — ABNORMAL HIGH (ref 4.0–10.5)
nRBC: 0 % (ref 0.0–0.2)

## 2021-08-17 LAB — COMPREHENSIVE METABOLIC PANEL
ALT: 17 U/L (ref 0–44)
AST: 19 U/L (ref 15–41)
Albumin: 3.5 g/dL (ref 3.5–5.0)
Alkaline Phosphatase: 61 U/L (ref 38–126)
Anion gap: 10 (ref 5–15)
BUN: 8 mg/dL (ref 6–20)
CO2: 21 mmol/L — ABNORMAL LOW (ref 22–32)
Calcium: 8.3 mg/dL — ABNORMAL LOW (ref 8.9–10.3)
Chloride: 104 mmol/L (ref 98–111)
Creatinine, Ser: 0.61 mg/dL (ref 0.44–1.00)
GFR, Estimated: >60 mL/min (ref >60)
Glucose, Bld: 129 mg/dL — ABNORMAL HIGH (ref 70–99)
Potassium: 4 mmol/L (ref 3.5–5.1)
Sodium: 135 mmol/L (ref 135–145)
Total Bilirubin: 0.7 mg/dL (ref 0.3–1.2)
Total Protein: 6.7 g/dL (ref 6.5–8.1)

## 2021-08-17 LAB — MAGNESIUM: Magnesium: 1.9 mg/dL (ref 1.7–2.4)

## 2021-08-17 MED ORDER — ONDANSETRON 4 MG PO TBDP
4.0000 mg | ORAL_TABLET | Freq: Four times a day (QID) | ORAL | 0 refills | Status: DC | PRN
  Filled 2021-08-17: qty 20, 5d supply, fill #0

## 2021-08-17 MED ORDER — GABAPENTIN 100 MG PO CAPS
200.0000 mg | ORAL_CAPSULE | Freq: Two times a day (BID) | ORAL | 0 refills | Status: DC
  Filled 2021-08-17: qty 20, 5d supply, fill #0

## 2021-08-17 MED ORDER — TRAMADOL HCL 50 MG PO TABS
50.0000 mg | ORAL_TABLET | Freq: Four times a day (QID) | ORAL | 0 refills | Status: DC | PRN
  Filled 2021-08-17: qty 10, 3d supply, fill #0

## 2021-08-17 MED ORDER — PANTOPRAZOLE SODIUM 40 MG PO TBEC
40.0000 mg | DELAYED_RELEASE_TABLET | Freq: Every day | ORAL | 0 refills | Status: DC
  Filled 2021-08-17: qty 90, 90d supply, fill #0

## 2021-08-17 MED ORDER — ACETAMINOPHEN 500 MG PO TABS: 1000.0000 mg | ORAL_TABLET | Freq: Three times a day (TID) | ORAL | 0 refills | Status: AC

## 2021-08-17 NOTE — Progress Notes (Signed)
S: Slept well overnight, denies any pain currently.  Tolerating liquids without difficulty.  Has walked in the halls.  O: Vitals, labs, intake/output, and orders reviewed at this time.  Afebrile, no tachycardia, normotensive, sats 96% on room air.  P.o. 300, urine output 2400.  CMP unremarkable.  WBC 11.8, hemoglobin 10.2 (11.2 preop)   Gen: A&Ox3, no distress  H&N: EOMI, atraumatic, neck supple Chest: unlabored respirations, RRR Abd: soft, nontender, nondistended, incision(s) c/d/i with Steri-Strips, no cellulitis or hematoma Ext: warm, no edema Neuro: grossly normal  Lines/tubes/drains: piv  A/P: Postop day 1 status post laparoscopic sleeve gastrectomy, doing well -Continue liquids and protein shakes -Continue SCDs, Lovenox, pulmonary toilet, ambulate -Discharge home today   Phylliss Blakes, MD [facility], [place]

## 2021-08-17 NOTE — Discharge Summary (Signed)
Physician Discharge Summary  Julie Mcneil WVP:[id number] DOB: [birth year] DOA: 08/16/2021  PCP: Biagio Borg, MD  Admit date: 08/16/2021 Discharge date: 08/17/2021  Recommendations for Outpatient Follow-up:    Follow-up Information     Clovis Riley, MD. Go on 09/09/2021.   Specialty: General Surgery Why: at 2:20pm.  Please arrive 15 minutes prior to your appointment time. Thank you. Contact information: [address] [place] [address] [phone]         Surgery, [place]. Go on 10/05/2021.   Specialty: General Surgery Why: at 9:20am. Please arrive 15 minutes prior to your appointment time.  Thank you. Contact information: [place] [address] [phone]                Discharge Diagnoses:  Active Problems:   Morbid obesity ([place])   Surgical Procedure: Laparoscopic Sleeve Gastrectomy, upper endoscopy  Discharge Condition: Good Disposition: Home  Diet recommendation: Postoperative sleeve gastrectomy diet (liquids only)  Filed Weights   08/16/21 1303  Weight: 122.9 kg     Hospital Course:  The patient was admitted for a planned laparoscopic sleeve gastrectomy. Please see operative note. Preoperatively the patient was given 5000 units of subcutaneous heparin for DVT prophylaxis. Postoperative prophylactic Lovenox dosing was started on the evening of postoperative day 0. ERAS protocol was used. On the evening of postoperative day 0, the patient was started on water and ice chips. On postoperative day 1 the patient had no fever or tachycardia and was tolerating water in their diet was gradually advanced throughout the day. The patient was ambulating without difficulty. Their vital signs are stable without fever or tachycardia. Their hemoglobin had remained stable.  The patient had received discharge instructions and counseling. They were deemed stable for discharge and had met discharge  criteria   Discharge Instructions   Allergies as of 08/17/2021       Reactions   Sumatriptan Shortness Of Breath   Tight chest   Levaquin [levofloxacin In D5w] Nausea Only   Nitrofurantoin Monohyd Macro Hives, Nausea And Vomiting        Medication List     STOP taking these medications    amoxicillin 400 MG/5ML suspension Commonly known as: AMOXIL   nystatin-triamcinolone ointment Commonly known as: MYCOLOG       TAKE these medications    acetaminophen 500 MG tablet Commonly known as: TYLENOL Take 2 tablets (1,000 mg total) by mouth every 8 (eight) hours for 5 days.   buPROPion 150 MG 12 hr tablet Commonly known as: WELLBUTRIN SR Take 1 tablet (150 mg total) by mouth daily.   citalopram 20 MG tablet Commonly known as: CELEXA Take 1 tablet (20 mg total) by mouth daily.   gabapentin 100 MG capsule Commonly known as: NEURONTIN Take 2 capsules (200 mg total) by mouth every 12 (twelve) hours.   IRON PO Take 1 tablet by mouth 2 (two) times a week.   ondansetron 4 MG disintegrating tablet Commonly known as: ZOFRAN-ODT Dissolve 1 tablet by mouth every 6 (six) hours as needed for nausea or vomiting.   pantoprazole 40 MG tablet Commonly known as: PROTONIX Take 1 tablet (40 mg total) by mouth daily.   traMADol 50 MG tablet Commonly known as: ULTRAM Take 1 tablet by mouth every 6 (six) hours as needed for pain.   Vitamin D (Ergocalciferol) 1.25 MG (50000 UNIT) Caps capsule Commonly known as: DRISDOL Take 1 capsule (50,000 Units total) by mouth every  7 (seven) days. What changed: when to take this        Follow-up Information     Clovis Riley, MD. Go on 09/09/2021.   Specialty: General Surgery Why: at 2:20pm.  Please arrive 15 minutes prior to your appointment time. Thank you. Contact information: [address] [place] [address] [phone]         Surgery, [place]. Go on 10/05/2021.   Specialty: General  Surgery Why: at 9:20am. Please arrive 15 minutes prior to your appointment time.  Thank you. Contact information: [place] [address] [phone]                  The results of significant diagnostics from this hospitalization (including imaging, microbiology, ancillary and laboratory) are listed below for reference.    Significant Diagnostic Studies: No results found.  Labs: Basic Metabolic Panel: Recent Labs  Lab 08/17/21 0415  NA 135  K 4.0  CL 104  CO2 21*  GLUCOSE 129*  BUN 8  CREATININE 0.61  CALCIUM 8.3*  MG 1.9   Liver Function Tests: Recent Labs  Lab 08/17/21 0415  AST 19  ALT 17  ALKPHOS 61  BILITOT 0.7  PROT 6.7  ALBUMIN 3.5    CBC: Recent Labs  Lab 08/17/21 0415  WBC 11.8*  NEUTROABS 9.9*  HGB 10.2*  HCT 31.8*  MCV 83.0  PLT 380    CBG: Recent Labs  Lab 08/16/21 1306  GLUCAP 91    Active Problems:   Morbid obesity ([place])    Signed:  [facility], [place] 08/17/2021, 4:36 PM

## 2021-08-17 NOTE — Progress Notes (Signed)
Patient alert and oriented, pain is controlled. Patient is tolerating fluids, advanced to protein shake today, patient is tolerating well.  Reviewed Gastric sleeve discharge instructions with patient and patient is able to articulate understanding.  Provided information on BELT program, Support Group and [facility] outpatient pharmacy. All questions answered, will continue to monitor.  

## 2021-08-17 NOTE — Discharge Instructions (Signed)

## 2021-08-17 NOTE — Progress Notes (Signed)
24hr fluid recall: 600mL.  Per dehydration protocol, will call pt to f/u within one week post op. 

## 2021-08-17 NOTE — Progress Notes (Signed)
Nutrition Education Note  Received consult for diet education for patient s/p bariatric surgery. She is POD #1 sleeve gastrectomy.  Discussed 2 week post op diet with pt. Emphasized that liquids must be non carbonated, non caffeinated, and sugar free. Fluid goals discussed. Pt to follow up with outpatient bariatric RD for further diet progression after 2 weeks. Multivitamins and minerals also reviewed. Teach back method used, pt expressed understanding, expect good compliance.  She has purchased protein shakes for home use. She has calcium supplement and plans to get multivitamin from Outpatient Pharmacy. Patient asks about unsweetened vanilla almond milk and Healthy Choice strained soups.   If nutrition issues arise, please consult RD.     Trenton Gammon, MS, RD, LDN, CNSC Inpatient Clinical Dietitian RD pager # available in [facility]  After hours/weekend pager # available in [facility]

## 2021-08-17 NOTE — Progress Notes (Signed)
Patient was given discharge instructions by [place], and all questions were answered.  Patient was stable for discharge and was taken to the main exit by wheelchair. 

## 2021-08-17 NOTE — Progress Notes (Signed)
Patient alert and oriented, Post op day 1.  Provided support and encouragement.  Encouraged pulmonary toilet, ambulation and small sips of liquids.  All questions answered.  Will continue to monitor. 

## 2021-08-18 ENCOUNTER — Telehealth: Payer: Self-pay

## 2021-08-18 LAB — SURGICAL PATHOLOGY

## 2021-08-18 NOTE — Telephone Encounter (Signed)
Transition Care Management Follow-up Telephone Call Date of discharge and from where: 08/17/21 from [facility] How have you been since you were released from the hospital? "My pain is a little bit better today" Any questions or concerns? No  Items Reviewed: Did the pt receive and understand the discharge instructions provided? Yes  Medications obtained and verified? Yes  Other? No  Any new allergies since your discharge? No  Dietary orders reviewed? Yes Do you have support at home? Yes   Home Care and Equipment/Supplies: Were home health services ordered? no If so, what is the name of the agency? Not applicable  Has the agency set up a time to come to the patient's home? not applicable Were any new equipment or medical supplies ordered?  No What is the name of the medical supply agency? Not applicable Were you able to get the supplies/equipment? not applicable Do you have any questions related to the use of the equipment or supplies? No  Functional Questionnaire: (I = Independent and D = Dependent) ADLs: I  Bathing/Dressing- I  Meal Prep- I  Eating- I  Maintaining continence- I  Transferring/Ambulation- I  Managing Meds- I  Follow up appointments reviewed:  PCP Hospital f/u appt confirmed? No  will follow up with specialist Specialist Hospital f/u appt confirmed? Yes  Scheduled for Bariatric class 60 08/30/21 at 3:15 pm; surgeon Dr. Leeroy Bock A. Fredricka Bonine 09/09/21 @ 2:20 pm and [facility] surgery 10/05/21 @ 9:20am. Are transportation arrangements needed? No  If their condition worsens, is the pt aware to call PCP or go to the Emergency Dept.? Yes Was the patient provided with contact information for the PCP's office or ED? Yes Was to pt encouraged to call back with questions or concerns? Yes   Kathyrn Sheriff, RN, MSN, BSN, CCM [facility] Care Management Coordinator [phone]

## 2021-08-19 ENCOUNTER — Telehealth (HOSPITAL_COMMUNITY): Payer: Self-pay | Admitting: *Deleted

## 2021-08-19 NOTE — Telephone Encounter (Signed)
1.  Tell me about your pain and pain management? Pt states that she has been experiencing some intermittent abdominal cramping while drinking that lasts for a few seconds and then "goes away".  Pt states that "you can just feel it going down".  Pt can tolerate protein shakes and water.  Pt instructed to call [facility] if pain worsens.  Pt c/o right lower abdominal incisional pain with movement and exertion.  Pt states that she has not taken any pain medication. Discussed with patient to try and splint her abdomen with changing positions to assist with the discomfort.  Encouraged pt to try options and/or contact [facility] if still concerned.   2.  Let's talk about fluid intake.  How much total fluid are you taking in? Pt states that she is working to meet goal of 64 oz of fluid today.  Pt states that she was able to meet fluid goal yesterday. Pt has consumed one protein shake and some water today.  Pt plans to drink rest of protein and water for the remainder of the day to meet goal. Pt instructed to assess status and suggestions daily utilizing Hydration Action Plan on discharge folder and to call [facility] if in the "red zone".   3.  How much protein have you taken in the last 2 days? Pt states that she is working to meet goal of 60g of protein today.  Pt has already consumed one protein shake.  Pt plans to drink remainder of protein throughout the rest of the day to meet goal.  4.  Have you had nausea?  Tell me about when have experienced nausea and what you did to help? Pt denies nausea.   5.  Has the frequency or color changed with your urine? Pt states that she is urinating "fine" with no changes in frequency or urgency.     6.  Tell me what your incisions look like? "Incisions look fine". Pt denies a fever, chills.  Pt states incisions are not swollen, open, or draining.  Pt encouraged to call [facility] if incisions change.   7.  Have you been passing gas? BM? Pt states that she is having BMs. Last BM  08/19/21.  8.  If a problem or question were to arise who would you call?  Do you know contact numbers for [facility], [facility], and [facility]? Pt denies dehydration symptoms.  Pt can describe s/sx of dehydration.  Pt knows to call [facility] for surgical, [facility] for nutrition, and [facility] for non-urgent questions or concerns.   9.  How has the walking going? Pt states she is walking around and able to be active without difficulty. Pt has already been able to run "errands" since surgery.     10. Are you still using your incentive spirometer?  If so, how often? Pt encouraged to use incentive spirometer, at least 10x every hour while awake until she sees the surgeon.  11.  How are your vitamins and calcium going?  How are you taking them? Pt states that she is taking her supplements and vitamins without difficulty.  Reminded patient that the first 30 days post-operatively are important for successful recovery.  Practice good hand hygiene, wearing a mask when appropriate (since optional in most places), and minimizing exposure to people who live outside of the home, especially if they are exhibiting any respiratory, GI, or illness-like symptoms.

## 2021-08-22 ENCOUNTER — Telehealth: Payer: Self-pay | Admitting: Skilled Nursing Facility1

## 2021-08-22 NOTE — Telephone Encounter (Signed)
Takes all day to get her protein shake in.   Advised: Try warm tea or decaf coffee with unflavored protein in.   Pt states broth smells too gross.

## 2021-08-30 ENCOUNTER — Other Ambulatory Visit: Payer: Self-pay

## 2021-08-30 ENCOUNTER — Encounter: Payer: 59 | Attending: Surgery | Admitting: Skilled Nursing Facility1

## 2021-08-31 NOTE — Progress Notes (Signed)
2 Week Post-Operative Nutrition Class   Patient was seen on 08/30/2021 for Post-Operative Nutrition education at the [facility].    Surgery date: 08/16/2021 Surgery type: sleeve gastrectomy  Start weight at [facility]: 275 pounds Weight today: 257.6 pounds Bowel Habits: Every day to every other day no complaints   Body Composition Scale 08/30/2021  Current Body Weight 257.6  Total Body Fat % 46  Visceral Fat 16  Fat-Free Mass % 53.9   Total Body Water % 41.4  Muscle-Mass lbs 31.9  BMI 45.2  Body Fat Displacement          Torso  lbs 73.5         Left Leg  lbs 14.7         Right Leg  lbs 14.7         Left Arm  lbs 7.3         Right Arm   lbs 7.3      The following the learning objectives were met by the patient during this course: Identifies Phase 3 (Soft, High Proteins) Dietary Goals and will begin from 2 weeks post-operatively to 2 months post-operatively Identifies appropriate sources of fluids and proteins  Identifies appropriate fat sources and healthy verses unhealthy fat types   States protein recommendations and appropriate sources post-operatively Identifies the need for appropriate texture modifications, mastication, and bite sizes when consuming solids Identifies appropriate fat consumption and sources Identifies appropriate multivitamin and calcium sources post-operatively Describes the need for physical activity post-operatively and will follow MD recommendations States when to call healthcare provider regarding medication questions or post-operative complications   Handouts given during class include: Phase 3A: Soft, High Protein Diet Handout Phase 3 High Protein Meals Healthy Fats   Follow-Up Plan: Patient will follow-up at [facility] in 6 weeks for 2 month post-op nutrition visit for diet advancement per MD.

## 2021-09-05 ENCOUNTER — Telehealth: Payer: Self-pay | Admitting: Skilled Nursing Facility1

## 2021-09-05 NOTE — Telephone Encounter (Signed)
RD called pt to verify fluid intake once starting soft, solid proteins 2 week post-bariatric surgery.   Daily Fluid intake: 40-50 oz Daily Protein intake: 60 g Bowel Habits: every day once to twice  Concerns/issues:   Pt states she was over other fluid options beside water which is why she backed off but now that she has not been reaching her fluid goals she will add them back. Pt states she is a bit overwhelmed with all the change and all the focus needed.   Dietitian encouraged pt to keep it up and she is doing great.

## 2021-10-17 ENCOUNTER — Other Ambulatory Visit: Payer: Self-pay

## 2021-10-17 ENCOUNTER — Encounter: Payer: 59 | Attending: Surgery | Admitting: Skilled Nursing Facility1

## 2021-10-17 NOTE — Progress Notes (Signed)
Bariatric Nutrition Follow-Up Visit Medical Nutrition Therapy    NUTRITION ASSESSMENT   Surgery date: 08/16/2021 Surgery type: sleeve gastrectomy  Start weight at [facility]: 275 pounds Weight today: 238.3 pounds   Body Composition Scale 08/30/2021 10/17/2021  Current Body Weight 257.6 238.3  Total Body Fat % 46 44.2  Visceral Fat 16 14  Fat-Free Mass % 53.9 55.7   Total Body Water % 41.4 42.3  Muscle-Mass lbs 31.9 31.5  BMI 45.2 41.8  Body Fat Displacement           Torso  lbs 73.5 65.3         Left Leg  lbs 14.7 13         Right Leg  lbs 14.7 13         Left Arm  lbs 7.3 6.5         Right Arm   lbs 7.3 6.5   Clinical  Medical hx: anemia, anxiety, GERD Medications: iron, zinc, vitamin C, multivitamin, Zyrtec , vitamin D, wellbutrin   Labs: none updated in EMR Notable signs/symptoms: none reported  Any previous deficiencies? Yes: iron, vitamin D   Lifestyle & Dietary Hx  Pt states she plans on getting back in tthe gym after the holidays.    Estimated daily fluid intake: 30-50 oz Estimated daily protein intake: 60+ g Supplements: iron free multivitamin and calcium  Current average weekly physical activity: walking 7 days a week 30 minutes   24-Hr Dietary Recall First Meal: coffee + protein shake Snack:  egg + cheese Second Meal: pack of tuna or chicken Snack:   Third Meal: pack of tuna or chicken Snack:  Beverages: water, coffee  Post-Op Goals/ Signs/ Symptoms Using straws: no Drinking while eating: no Chewing/swallowing difficulties: no Changes in vision: no Changes to mood/headaches: no Hair loss/changes to skin/nails: no Difficulty focusing/concentrating: no Sweating: no Limb weakness: no Dizziness/lightheadedness: no Palpitations: no  Carbonated/caffeinated beverages: no N/V/D/C/Gas: no Abdominal pain: no Dumping syndrome: no    NUTRITION DIAGNOSIS  Overweight/obesity ([place]-3.3) related to past poor dietary habits and physical inactivity as evidenced  by completed bariatric surgery and following dietary guidelines for continued weight loss and healthy nutrition status.     NUTRITION INTERVENTION Nutrition counseling (C-1) and education (E-2) to facilitate bariatric surgery goals, including: Diet advancement to the next phase (phase 4) now including non starchy vegetables The importance of consuming adequate calories as well as certain nutrients daily due to the body's need for essential vitamins, minerals, and fats The importance of daily physical activity and to reach a goal of at least 150 minutes of moderate to vigorous physical activity weekly (or as directed by their physician) due to benefits such as increased musculature and improved lab values The importance of intuitive eating specifically learning hunger-satiety cues and understanding the importance of learning a new body: The importance of mindful eating to avoid grazing behaviors   Goals: -Continue to aim for a minimum of 64 fluid ounces 7 days a week with at least 30 ounces being plain water  -Eat non-starchy vegetables 2 times a day 7 days a week  -Start out with soft cooked vegetables today and tomorrow; if tolerated begin to eat raw vegetables or cooked including salads  -Eat your 3 ounces of protein first then start in on your non-starchy vegetables; once you understand how much of your meal leads to satisfaction and not full while still eating 3 ounces of protein and non-starchy vegetables you can eat them in any order   -  Continue to aim for 30 minutes of activity at least 5 times a week  -Do NOT cook with/add to your food: alfredo sauce, cheese sauce, barbeque sauce, ketchup, fat back, butter, bacon grease, grease, Crisco, OR SUGAR   Handouts Provided Include  Phase 3 and 4  Learning Style & Readiness for Change Teaching method utilized: Visual & Auditory  Demonstrated degree of understanding via: Teach Back  Readiness Level: Action Barriers to learning/adherence  to lifestyle change: none identified   RD's Notes for Next Visit Assess adherence to pt chosen goals    MONITORING & EVALUATION Dietary intake, weekly physical activity, body weight  Next Steps Patient is to follow-up in 3-4 months

## 2021-10-24 ENCOUNTER — Encounter: Payer: Self-pay | Admitting: Nurse Practitioner

## 2021-10-24 NOTE — Telephone Encounter (Signed)
Patient is scheduled on 10/27/21

## 2021-10-27 ENCOUNTER — Ambulatory Visit (INDEPENDENT_AMBULATORY_CARE_PROVIDER_SITE_OTHER): Payer: 59 | Admitting: Nurse Practitioner

## 2021-10-27 ENCOUNTER — Other Ambulatory Visit: Payer: Self-pay

## 2021-10-27 VITALS — BP 124/82

## 2021-10-27 DIAGNOSIS — L989 Disorder of the skin and subcutaneous tissue, unspecified: Secondary | ICD-10-CM | POA: Diagnosis not present

## 2021-10-27 NOTE — Progress Notes (Signed)
° °  Acute Office Visit  Subjective:    Patient ID: Julie Mcneil, female    DOB: 08-28-1981, 40 y.o.   MRN: [id number]   HPI 40 y.o. presents today for moles on breasts. She noticed the one on left breast 1-2 months ago but she thinks it may have changed slightly in color. The other she just noticed recently on right lower breast. She has always had many moles and cherry angiomas. No personal history or family history of skin cancers. She has appointment with dermatology January 6th but she was concerned it could be related to a breast issue. Has not had screening mammogram yet.    Review of Systems  Constitutional: Negative.   Skin:        Two new skin lesions one on left breast and one on right  Right breast: Negative for swelling, redness, nipple discharge, mass, or nipple inversion. Positive for skin lesion.  Left breast: Negative for swelling, redness, nipple discharge, mass, or nipple inversion. Positive for skin lesion.    Objective:    Physical Exam Chest:  Breasts:    Right: No swelling, bleeding, inverted nipple, mass, nipple discharge, skin change (Other than mole) or tenderness.     Left: No swelling, bleeding, inverted nipple, mass, nipple discharge, skin change (Other then new skin lesion) or tenderness.    Skin:    Comments: Cherry angiomas and many brown (some raised) nevi along torso    BP 124/82    LMP 10/06/2021  Wt Readings from Last 3 Encounters:  10/17/21 238 lb 4.8 oz (108.1 kg)  08/31/21 257 lb 9.6 oz (116.8 kg)  08/16/21 271 lb (122.9 kg)        Assessment & Plan:   Problem List Items Addressed This Visit   None Visit Diagnoses     Skin lesions    -  Primary      Plan: Left breast lesion appears benign in nature and is likely a seborrheic keratosis. Right breast lesion is questionable. She already has appointment with dermatology for evaluation January 6th. Recommend keeping that appointment and she will also schedule screening mammogram  now. Reassured areas are very likely dermatology-related but it is best to go ahead and have mammogram performed. She is agreeable to plan.      Olivia Mackie DNP, 11:20 AM 10/27/2021

## 2021-12-21 ENCOUNTER — Other Ambulatory Visit (INDEPENDENT_AMBULATORY_CARE_PROVIDER_SITE_OTHER): Payer: Self-pay | Admitting: Family Medicine

## 2021-12-21 DIAGNOSIS — F3289 Other specified depressive episodes: Secondary | ICD-10-CM

## 2021-12-21 NOTE — Telephone Encounter (Signed)
Julie Mcneil 

## 2022-01-17 ENCOUNTER — Encounter: Payer: 59 | Attending: Surgery | Admitting: Skilled Nursing Facility1

## 2022-01-17 ENCOUNTER — Other Ambulatory Visit: Payer: Self-pay

## 2022-01-18 NOTE — Progress Notes (Signed)
Follow-up visit:  Post-Operative sleeve Surgery ? ?Medical Nutrition Therapy:  Appt start time: 6:00pm end time:  7:00pm ? ?Primary concerns today: Post-operative Bariatric Surgery Nutrition Management 6 Month Post-Op Class ? ?Surgery date: 08/16/2021 ?Surgery type: sleeve gastrectomy  ?Start weight at [facility]: 275 pounds ?Weight today: 222.5 pounds ?  ?Body Composition Scale 08/30/2021 10/17/2021 01/18/2022  ?Current Body Weight 257.6 238.3 222.5  ?Total Body Fat % 46 44.2 42.4  ?Visceral Fat 16 14 13   ?Fat-Free Mass % 53.9 55.7 57.5  ? Total Body Water % 41.4 42.3 43.2  ?Muscle-Mass lbs 31.9 31.5 31.4  ?BMI 45.2 41.8 39  ?Body Fat Displacement     ?       Torso  lbs 73.5 65.3 58.5  ?       Left Leg  lbs 14.7 13 11.7  ?       Right Leg  lbs 14.7 13 11.7  ?       Left Arm  lbs 7.3 6.5 5.8  ?       Right Arm   lbs 7.3 6.5 5.8  ? ?Clinical  ?Medical hx: anemia, anxiety, GERD ?Medications: iron, zinc, vitamin C, multivitamin, Zyrtec , vitamin D, wellbutrin   ?Labs: none updated in EMR ?Notable signs/symptoms: none reported  ?Any previous deficiencies? Yes: iron, vitamin D ? ? ? ?Information Reviewed/ Discussed During Appointment: ?-Review of composition scale numbers ?-Fluid requirements (64-100 ounces) ?-Protein requirements (60-80g) ?-Strategies for tolerating diet ?-Advancement of diet to include Starchy vegetables ?-Barriers to inclusion of new foods ?-Inclusion of appropriate multivitamin and calcium supplements  ?-Exercise recommendations  ? ?Fluid intake: adequate  ? ?Medications: See List ?Supplementation: appropriate  ? ? ?Using straws: no ?Drinking while eating: no ?Having you been chewing well: yes ?Chewing/swallowing difficulties: no ?Changes in vision: no ?Changes to mood/headaches: no ?Hair loss/Cahnges to skin/Changes to nails: no ?Any difficulty focusing or concentrating: no ?Sweating: no ?Dizziness/Lightheaded: no ?Palpitations: no  ?Carbonated beverages: no ?N/V/D/C/GAS: no ?Abdominal Pain: no ?Dumping  syndrome: no ? ?Recent physical activity:  ADL's ? ?Progress Towards Goal(s):  In Progress ?Teaching method utilized: Visual & Auditory  ?Demonstrated degree of understanding via: Teach Back  ?Readiness Level: Action ?Barriers to learning/adherence to lifestyle change: none identified ? ?Handouts given during visit include: ?Phase V diet Progression  ?Goals Sheet ?The Benefits of Exercise are endless..... ?Support Group Topics ? ? ?Teaching Method Utilized:  ?Visual ?Auditory ?Hands on ? ?Demonstrated degree of understanding via:  Teach Back  ? ?Monitoring/Evaluation:  Dietary intake, exercise, and body weight. Follow up in 3 months for 9 month post-op visit. ? ?

## 2022-01-22 ENCOUNTER — Other Ambulatory Visit (INDEPENDENT_AMBULATORY_CARE_PROVIDER_SITE_OTHER): Payer: Self-pay | Admitting: Family Medicine

## 2022-01-22 DIAGNOSIS — F3289 Other specified depressive episodes: Secondary | ICD-10-CM

## 2022-01-30 ENCOUNTER — Other Ambulatory Visit (INDEPENDENT_AMBULATORY_CARE_PROVIDER_SITE_OTHER): Payer: Self-pay | Admitting: Family Medicine

## 2022-01-30 DIAGNOSIS — F3289 Other specified depressive episodes: Secondary | ICD-10-CM

## 2022-01-31 ENCOUNTER — Encounter: Payer: Self-pay | Admitting: Internal Medicine

## 2022-02-01 NOTE — Telephone Encounter (Signed)
Patient would need to be seen by provider prior to receiving refills. 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 Will have OB doctor refill medications.  ?

## 2022-03-22 ENCOUNTER — Other Ambulatory Visit (HOSPITAL_COMMUNITY): Payer: Self-pay

## 2022-03-22 ENCOUNTER — Telehealth: Payer: 59 | Admitting: Physician Assistant

## 2022-03-22 DIAGNOSIS — J020 Streptococcal pharyngitis: Secondary | ICD-10-CM

## 2022-03-22 MED ORDER — PANTOPRAZOLE SODIUM 40 MG PO TBEC
40.0000 mg | DELAYED_RELEASE_TABLET | Freq: Every day | ORAL | 3 refills | Status: DC
  Filled 2022-03-22 (×2): qty 30, 30d supply, fill #0

## 2022-03-22 MED ORDER — AMOXICILLIN 500 MG PO CAPS
500.0000 mg | ORAL_CAPSULE | Freq: Two times a day (BID) | ORAL | 0 refills | Status: AC
Start: 2022-03-22 — End: 2022-04-01
  Filled 2022-03-22: qty 20, 10d supply, fill #0

## 2022-03-22 NOTE — Progress Notes (Signed)

## 2022-05-15 ENCOUNTER — Telehealth: Payer: 59 | Admitting: Physician Assistant

## 2022-05-15 ENCOUNTER — Other Ambulatory Visit (INDEPENDENT_AMBULATORY_CARE_PROVIDER_SITE_OTHER): Payer: Self-pay | Admitting: Family Medicine

## 2022-05-15 DIAGNOSIS — R3989 Other symptoms and signs involving the genitourinary system: Secondary | ICD-10-CM | POA: Diagnosis not present

## 2022-05-15 DIAGNOSIS — F3289 Other specified depressive episodes: Secondary | ICD-10-CM

## 2022-05-16 MED ORDER — CEPHALEXIN 500 MG PO CAPS: 500.0000 mg | ORAL_CAPSULE | Freq: Two times a day (BID) | ORAL | 0 refills | Status: AC

## 2022-05-16 NOTE — Progress Notes (Signed)

## 2022-05-16 NOTE — Progress Notes (Signed)
I have spent 5 minutes in review of e-visit questionnaire, review and updating patient chart, medical decision making and response to patient.   Wendle Kina Cody Joeseph Verville, PA-C    

## 2022-05-19 ENCOUNTER — Ambulatory Visit: Payer: Self-pay | Admitting: Nurse Practitioner

## 2022-05-19 DIAGNOSIS — Z0289 Encounter for other administrative examinations: Secondary | ICD-10-CM

## 2022-05-19 NOTE — Progress Notes (Deleted)
   Acute Office Visit  Subjective:    Patient ID: Max Sane, female    DOB: Aug 24, 1981, 41 y.o.   MRN: [id number]   HPI 41 y.o. presents today for anxiety and hemorrhoids. Anxiety being managed by PCP previously, was on Wellbutrin and Celexa previously. On antibiotics for UTI.    Review of Systems     Objective:    Physical Exam  There were no vitals taken for this visit. Wt Readings from Last 3 Encounters:  01/18/22 222 lb 8 oz (100.9 kg)  10/17/21 238 lb 4.8 oz (108.1 kg)  08/31/21 257 lb 9.6 oz (116.8 kg)        Patient informed chaperone available to be present for breast and/or pelvic exam. Patient has requested no chaperone to be present. Patient has been advised what will be completed during breast and pelvic exam.   Assessment & Plan:   Problem List Items Addressed This Visit   None       Olivia Mackie DNP, 11:47 AM 05/19/2022

## 2022-06-14 ENCOUNTER — Encounter (INDEPENDENT_AMBULATORY_CARE_PROVIDER_SITE_OTHER): Payer: Self-pay

## 2022-07-18 ENCOUNTER — Telehealth: Payer: 59 | Admitting: Physician Assistant

## 2022-07-18 DIAGNOSIS — L239 Allergic contact dermatitis, unspecified cause: Secondary | ICD-10-CM | POA: Diagnosis not present

## 2022-07-19 ENCOUNTER — Other Ambulatory Visit (HOSPITAL_COMMUNITY): Payer: Self-pay

## 2022-07-19 MED ORDER — PREDNISONE 10 MG PO TABS
ORAL_TABLET | ORAL | 0 refills | Status: AC
Start: 2022-07-19 — End: 2022-08-02
  Filled 2022-07-19: qty 37, 14d supply, fill #0

## 2022-07-19 NOTE — Progress Notes (Signed)
E Visit for Rash  We are sorry that you are not feeling well. Here is how we plan to help!  Based on what you shared with me it looks like you have contact dermatitis.  Contact dermatitis is a skin rash caused by something that touches the skin and causes irritation or inflammation.  Your skin may be red, swollen, dry, cracked, and itch.  The rash should go away in a few days but can last a few weeks.  If you get a rash, it's important to figure out what caused it so the irritant can be avoided in the future. and I am prescribing a two week course of steroids (37 tablets of 10 mg prednisone).  Days 1-4 take 4 tablets (40 mg) daily  Days 5-8 take 3 tablets (30 mg) daily, Days 9-11 take 2 tablets (20 mg) daily, Days 12-14 take 1 tablet (10 mg) daily.       HOME CARE:  Take cool showers and avoid direct sunlight. Apply cool compress or wet dressings. Take a bath in an oatmeal bath.  Sprinkle content of one Aveeno packet under running faucet with comfortably warm water.  Bathe for 15-20 minutes, 1-2 times daily.  Pat dry with a towel. Do not rub the rash. Use hydrocortisone cream. Take an antihistamine like Benadryl for widespread rashes that itch.  The adult dose of Benadryl is 25-50 mg by mouth 4 times daily. Caution:  This type of medication may cause sleepiness.  Do not drink alcohol, drive, or operate dangerous machinery while taking antihistamines.  Do not take these medications if you have prostate enlargement.  Read package instructions thoroughly on all medications that you take.  GET HELP RIGHT AWAY IF:  Symptoms don't go away after treatment. Severe itching that persists. If you rash spreads or swells. If you rash begins to smell. If it blisters and opens or develops a yellow-brown crust. You develop a fever. You have a sore throat. You become short of breath.  MAKE SURE YOU:  Understand these instructions. Will watch your condition. Will get help right away if you are not  doing well or get worse.  Thank you for choosing an e-visit.  Your e-visit answers were reviewed by a board certified advanced clinical practitioner to complete your personal care plan. Depending upon the condition, your plan could have included both over the counter or prescription medications.  Please review your pharmacy choice. Make sure the pharmacy is open so you can pick up prescription now. If there is a problem, you may contact your provider through MyChart messaging and have the prescription routed to another pharmacy.  Your safety is important to us. If you have drug allergies check your prescription carefully.   For the next 24 hours you can use MyChart to ask questions about today's visit, request a non-urgent call back, or ask for a work or school excuse. You will get an email in the next two days asking about your experience. I hope that your e-visit has been valuable and will speed your recovery.  

## 2022-07-19 NOTE — Progress Notes (Signed)
I have spent 5 minutes in review of e-visit questionnaire, review and updating patient chart, medical decision making and response to patient.   Ernestine Rohman Cody Daylon Lafavor, PA-C    

## 2022-07-20 ENCOUNTER — Other Ambulatory Visit (HOSPITAL_COMMUNITY): Payer: Self-pay

## 2022-07-22 ENCOUNTER — Other Ambulatory Visit (HOSPITAL_COMMUNITY): Payer: Self-pay

## 2022-07-23 ENCOUNTER — Other Ambulatory Visit (HOSPITAL_COMMUNITY): Payer: Self-pay

## 2022-07-23 ENCOUNTER — Telehealth: Payer: 59 | Admitting: Nurse Practitioner

## 2022-07-23 DIAGNOSIS — B359 Dermatophytosis, unspecified: Secondary | ICD-10-CM

## 2022-07-23 MED ORDER — CLOTRIMAZOLE-BETAMETHASONE 1-0.05 % EX CREA
1.0000 | TOPICAL_CREAM | Freq: Every day | CUTANEOUS | 0 refills | Status: DC
  Filled 2022-07-23: qty 30, 15d supply, fill #0

## 2022-07-23 NOTE — Progress Notes (Signed)
E Visit for Rash  We are sorry that you are not feeling well. Here is how we plan to help!  Based on what you shared with me it looks like you have contact dermatitis.  Contact dermatitis is a skin rash caused by something that touches the skin and causes irritation or inflammation.  Your skin may be red, swollen, dry, cracked, and itch.  The rash should go away in a few days but can last a few weeks.  If you get a rash, it's important to figure out what caused it so the irritant can be avoided in the future.   I have prescribed:clotrimazole today for your rash   HOME CARE:  Take cool showers and avoid direct sunlight. Apply cool compress or wet dressings. Take a bath in an oatmeal bath.  Sprinkle content of one Aveeno packet under running faucet with comfortably warm water.  Bathe for 15-20 minutes, 1-2 times daily.  Pat dry with a towel. Do not rub the rash. Use hydrocortisone cream. Take an antihistamine like Benadryl for widespread rashes that itch.  The adult dose of Benadryl is 25-50 mg by mouth 4 times daily. Caution:  This type of medication may cause sleepiness.  Do not drink alcohol, drive, or operate dangerous machinery while taking antihistamines.  Do not take these medications if you have prostate enlargement.  Read package instructions thoroughly on all medications that you take.  GET HELP RIGHT AWAY IF:  Symptoms don't go away after treatment. Severe itching that persists. If you rash spreads or swells. If you rash begins to smell. If it blisters and opens or develops a yellow-brown crust. You develop a fever. You have a sore throat. You become short of breath.  MAKE SURE YOU:  Understand these instructions. Will watch your condition. Will get help right away if you are not doing well or get worse.  Thank you for choosing an e-visit.  Your e-visit answers were reviewed by a board certified advanced clinical practitioner to complete your personal care plan.  Depending upon the condition, your plan could have included both over the counter or prescription medications.  Please review your pharmacy choice. Make sure the pharmacy is open so you can pick up prescription now. If there is a problem, you may contact your provider through [employer] and have the prescription routed to another pharmacy.  Your safety is important to [place]. If you have drug allergies check your prescription carefully.   For the next 24 hours you can use MyChart to ask questions about today's visit, request a non-urgent call back, or ask for a work or school excuse. You will get an email in the next two days asking about your experience. I hope that your e-visit has been valuable and will speed your recovery.

## 2022-07-23 NOTE — Progress Notes (Signed)
I have spent 5 minutes in review of e-visit questionnaire, review and updating patient chart, medical decision making and response to patient.  ° °Daley Mooradian W Maicey Barrientez, NP ° °  °

## 2022-07-29 ENCOUNTER — Other Ambulatory Visit (HOSPITAL_COMMUNITY): Payer: Self-pay

## 2022-08-17 ENCOUNTER — Telehealth: Payer: 59 | Admitting: Family Medicine

## 2022-08-17 DIAGNOSIS — R3989 Other symptoms and signs involving the genitourinary system: Secondary | ICD-10-CM

## 2022-08-17 MED ORDER — CEPHALEXIN 250 MG/5ML PO SUSR: 500.0000 mg | Freq: Two times a day (BID) | ORAL | 0 refills | Status: AC

## 2022-08-17 NOTE — Progress Notes (Signed)

## 2022-08-29 ENCOUNTER — Telehealth: Payer: 59 | Admitting: Nurse Practitioner

## 2022-08-29 DIAGNOSIS — J0100 Acute maxillary sinusitis, unspecified: Secondary | ICD-10-CM | POA: Diagnosis not present

## 2022-08-29 MED ORDER — AMOXICILLIN-POT CLAVULANATE 600-42.9 MG/5ML PO SUSR: ORAL | 0 refills | Status: DC

## 2022-08-29 NOTE — Progress Notes (Signed)

## 2022-08-29 NOTE — Addendum Note (Signed)
Addended by: Chevis Pretty on: 08/29/2022 05:36 PM   Modules accepted: Orders

## 2022-09-18 ENCOUNTER — Encounter (INDEPENDENT_AMBULATORY_CARE_PROVIDER_SITE_OTHER): Payer: Self-pay

## 2022-09-18 DIAGNOSIS — Z0289 Encounter for other administrative examinations: Secondary | ICD-10-CM

## 2022-09-26 ENCOUNTER — Other Ambulatory Visit (HOSPITAL_COMMUNITY): Payer: Self-pay

## 2022-10-03 ENCOUNTER — Other Ambulatory Visit (HOSPITAL_COMMUNITY): Payer: Self-pay

## 2022-10-04 ENCOUNTER — Ambulatory Visit (INDEPENDENT_AMBULATORY_CARE_PROVIDER_SITE_OTHER): Payer: 59 | Admitting: Family Medicine

## 2022-10-08 ENCOUNTER — Telehealth: Payer: 59 | Admitting: Family

## 2022-10-08 DIAGNOSIS — R112 Nausea with vomiting, unspecified: Secondary | ICD-10-CM | POA: Diagnosis not present

## 2022-10-08 MED ORDER — ONDANSETRON HCL 4 MG PO TABS
4.0000 mg | ORAL_TABLET | Freq: Three times a day (TID) | ORAL | 0 refills | Status: DC | PRN
  Filled 2022-10-08: qty 20, 7d supply, fill #0

## 2022-10-08 NOTE — Progress Notes (Signed)
E-Visit for Vomiting  We are sorry that you are not feeling well. Here is how we plan to help!  Based on what you have shared with me it looks like you have a Virus that is irritating your GI tract.  Vomiting is the forceful emptying of a portion of the stomach's content through the mouth.  Although nausea and vomiting can make you feel miserable, it's important to remember that these are not diseases, but rather symptoms of an underlying illness.  When we treat short term symptoms, we always caution that any symptoms that persist should be fully evaluated in a medical office.  I have prescribed a medication that will help alleviate your symptoms and allow you to stay hydrated:  Zofran 4 mg 1 tablet every 8 hours as needed for nausea and vomiting  HOME CARE: Drink clear liquids.  This is very important! Dehydration (the lack of fluid) can lead to a serious complication.  Start off with 1 tablespoon every 5 minutes for 8 hours. You may begin eating bland foods after 8 hours without vomiting.  Start with saltine crackers, white bread, rice, mashed potatoes, applesauce. After 48 hours on a bland diet, you may resume a normal diet. Try to go to sleep.  Sleep often empties the stomach and relieves the need to vomit.  GET HELP RIGHT AWAY IF:  Your symptoms do not improve or worsen within 2 days after treatment. You have a fever for over 3 days. You cannot keep down fluids after trying the medication.  MAKE SURE YOU:  Understand these instructions. Will watch your condition. Will get help right away if you are not doing well or get worse.   Thank you for choosing an e-visit.  Your e-visit answers were reviewed by a board certified advanced clinical practitioner to complete your personal care plan. Depending upon the condition, your plan could have included both over the counter or prescription medications.  Please review your pharmacy choice. Make sure the pharmacy is open so you can pick  up prescription now. If there is a problem, you may contact your provider through MyChart messaging and have the prescription routed to another pharmacy.  Your safety is important to us. If you have drug allergies check your prescription carefully.   For the next 24 hours you can use MyChart to ask questions about today's visit, request a non-urgent call back, or ask for a work or school excuse. You will get an email in the next two days asking about your experience. I hope that your e-visit has been valuable and will speed your recovery.   Approximately 5 minutes was spent documenting and reviewing patient's chart.    

## 2022-10-09 ENCOUNTER — Other Ambulatory Visit (HOSPITAL_COMMUNITY): Payer: Self-pay

## 2022-10-11 ENCOUNTER — Ambulatory Visit: Payer: 59 | Admitting: Nurse Practitioner

## 2022-10-12 ENCOUNTER — Other Ambulatory Visit (HOSPITAL_COMMUNITY): Payer: Self-pay

## 2022-10-13 ENCOUNTER — Other Ambulatory Visit (HOSPITAL_COMMUNITY): Payer: Self-pay

## 2022-10-17 ENCOUNTER — Ambulatory Visit (INDEPENDENT_AMBULATORY_CARE_PROVIDER_SITE_OTHER): Payer: 59 | Admitting: Family Medicine

## 2022-10-19 ENCOUNTER — Other Ambulatory Visit (HOSPITAL_COMMUNITY): Payer: Self-pay

## 2022-10-24 ENCOUNTER — Ambulatory Visit: Payer: 59 | Admitting: Nurse Practitioner

## 2022-10-24 NOTE — Progress Notes (Deleted)
   Julie Mcneil 1981/04/18 [id number]   History:  41 y.o. [id number] presents for annual exam. Monthly cycles. Normal pap history. She does have intermittent groin redness/itching. She applies Aquafor with some relief. In the process of planning bariatric surgery.   Gynecologic History No LMP recorded.   Contraception/Family planning: tubal ligation Sexually active: Yes  Health Maintenance Last Pap: 04/09/2017. Results were: Normal neg HPV, 5-year repeat Last mammogram: Never Last colonoscopy: Not indicated Last Dexa: Not indicated   Past medical history, past surgical history, family history and social history were all reviewed and documented in the EPIC chart. Married. 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 3 daughters - 4th, 39th, and 10th graders.   ROS:  A ROS was performed and pertinent positives and negatives are included.  Exam:  There were no vitals filed for this visit.  There is no height or weight on file to calculate BMI.  General appearance:  Normal Thyroid:  Symmetrical, normal in size, without palpable masses or nodularity. Respiratory  Auscultation:  Clear without wheezing or rhonchi Cardiovascular  Auscultation:  Regular rate, without rubs, murmurs or gallops  Edema/varicosities:  Not grossly evident Abdominal  Soft,nontender, without masses, guarding or rebound.  Liver/spleen:  No organomegaly noted  Hernia:  None appreciated  Skin  Inspection:  Redness, excoriation to bilateral groin Breasts: Examined lying and sitting.   Right: Without masses, retractions, nipple discharge or axillary adenopathy.   Left: Without masses, retractions, nipple discharge or axillary adenopathy. Genitourinary   Inguinal/mons:  Normal without inguinal adenopathy  External genitalia:  Normal appearing vulva with no masses, tenderness, or lesions  BUS/Urethra/Skene's glands:  Normal  Vagina:  Normal appearing with normal color and discharge, no lesions  Cervix:   Normal appearing without discharge or lesions  Uterus:  Normal in size, shape and contour.  Midline and mobile, nontender  Adnexa/parametria:     Rt: Normal in size, without masses or tenderness.   Lt: Normal in size, without masses or tenderness.  Anus and perineum: Normal  Patient informed chaperone available to be present for breast and pelvic exam. Patient has requested no chaperone to be present. Patient has been advised what will be completed during breast and pelvic exam.   Assessment/Plan:  41 y.o. [id number] for annual exam.   Well female exam with routine gynecological exam - Education provided on SBEs, importance of preventative screenings, current guidelines, high calcium diet, regular exercise, and multivitamin daily.   Screening for cervical cancer - Normal Pap history.  Pap today.   Screening for breast cancer - Has not had screening mammogram. Normal breast exam today.   Return in 1 year for annual.     Olivia Mackie DNP, 1:51 PM 10/24/2022

## 2022-11-06 ENCOUNTER — Telehealth: Payer: 59 | Admitting: Physician Assistant

## 2022-11-06 DIAGNOSIS — R6889 Other general symptoms and signs: Secondary | ICD-10-CM | POA: Diagnosis not present

## 2022-11-06 MED ORDER — OSELTAMIVIR PHOSPHATE 75 MG PO CAPS
75.0000 mg | ORAL_CAPSULE | Freq: Two times a day (BID) | ORAL | 0 refills | Status: DC
Start: 2022-11-06 — End: 2023-06-18

## 2022-11-06 NOTE — Progress Notes (Signed)
I have spent 5 minutes in review of e-visit questionnaire, review and updating patient chart, medical decision making and response to patient.   Sophy Mesler Cody Zeniyah Peaster, PA-C    

## 2022-11-06 NOTE — Progress Notes (Signed)
E visit for Flu like symptoms   We are sorry that you are not feeling well.  Here is how we plan to help! Based on what you have shared with me it looks like you may have a respiratory virus that may be influenza.  Influenza or "the flu" is   an infection caused by a respiratory virus. The flu virus is highly contagious and persons who did not receive their yearly flu vaccination may "catch" the flu from close contact.  We have anti-viral medications to treat the viruses that cause this infection. They are not a "cure" and only shorten the course of the infection. These prescriptions are most effective when they are given within the first 2 days of "flu" symptoms. Antiviral medication are indicated if you have a high risk of complications from the flu. You should  also consider an antiviral medication if you are in close contact with someone who is at risk. These medications can help patients avoid complications from the flu  but have side effects that you should know. Possible side effects from Tamiflu or oseltamivir include nausea, vomiting, diarrhea, dizziness, headaches, eye redness, sleep problems or other respiratory symptoms. You should not take Tamiflu if you have an allergy to oseltamivir or any to the ingredients in Tamiflu.  Based upon your symptoms and potential risk factors I have prescribed Oseltamivir (Tamiflu).  It has been sent to your designated pharmacy.  You will take one 75 mg capsule orally twice a day for the next 5 days. and I recommend that you follow the flu symptoms recommendation that I have listed below.  Please keep well-hydrated and try to get plenty of rest. If you have a humidifier, place it in the bedroom and run it at night. Start a saline nasal rinse for nasal congestion. You can consider use of a nasal steroid spray like Flonase or Nasacort OTC. You can alternate between Tylenol and Ibuprofen if needed for fever, body aches, headache and/or throat pain. Salt  water-gargles and chloraseptic spray can be very beneficial for sore throat. Mucinex-DM for congestion or cough. Please take all prescribed medications as directed.  Remain out of work until fever-free for 24 hours without a fever-reducing medication, and you are feeling better.  You should mask until symptoms are resolved.  If anything worsens despite treatment, you need to be evaluated in-person. Please do not delay care.   ANYONE WHO HAS FLU SYMPTOMS SHOULD: Stay home. The flu is highly contagious and going out or to work exposes others! Be sure to drink plenty of fluids. Water is fine as well as fruit juices, sodas and electrolyte beverages. You may want to stay away from caffeine or alcohol. If you are nauseated, try taking small sips of liquids. How do you know if you are getting enough fluid? Your urine should be a pale yellow or almost colorless. Get rest. Taking a steamy shower or using a humidifier may help nasal congestion and ease sore throat pain. Using a saline nasal spray works much the same way. Cough drops, hard candies and sore throat lozenges may ease your cough. Line up a caregiver. Have someone check on you regularly.   GET HELP RIGHT AWAY IF: You cannot keep down liquids or your medications. You become short of breath Your fell like you are going to pass out or loose consciousness. Your symptoms persist after you have completed your treatment plan MAKE SURE YOU  Understand these instructions. Will watch your condition. Will get help right   away if you are not doing well or get worse.  Your e-visit answers were reviewed by a board certified advanced clinical practitioner to complete your personal care plan.  Depending on the condition, your plan could have included both over the counter or prescription medications.  If there is a problem please reply  once you have received a response from your provider.  Your safety is important to us.  If you have drug allergies  check your prescription carefully.    You can use MyChart to ask questions about today's visit, request a non-urgent call back, or ask for a work or school excuse for 24 hours related to this e-Visit. If it has been greater than 24 hours you will need to follow up with your provider, or enter a new e-Visit to address those concerns.  You will get an e-mail in the next two days asking about your experience.  I hope that your e-visit has been valuable and will speed your recovery. Thank you for using e-visits.  

## 2022-11-30 ENCOUNTER — Telehealth: Payer: 59 | Admitting: Family

## 2022-11-30 DIAGNOSIS — R399 Unspecified symptoms and signs involving the genitourinary system: Secondary | ICD-10-CM

## 2022-11-30 DIAGNOSIS — R509 Fever, unspecified: Secondary | ICD-10-CM

## 2022-11-30 DIAGNOSIS — R109 Unspecified abdominal pain: Secondary | ICD-10-CM

## 2022-11-30 DIAGNOSIS — R197 Diarrhea, unspecified: Secondary | ICD-10-CM

## 2022-11-30 NOTE — Progress Notes (Signed)
Because you are having these symptoms, I feel your condition warrants further evaluation and I recommend that you be seen in a face to face visit.   NOTE: There will be NO CHARGE for this eVisit   If you are having a true medical emergency please call 911.      For an urgent face to face visit, [place] has eight urgent care centers for your convenience:   NEW!! [facility] [place] [facility] [place] Get Driving Directions [phone] [address] [place], [place] [facility] [place] [facility] [place] Get Driving Directions [phone] [place], [place] [address]   [place] [facility]) Get Driving Directions [phone] [address], [place] [address]  [place] [facility] [place] ([place]) Get Driving Directions [phone] [address] [place],  [place]  [address]  [place] [facility] - at [address] Get Driving Directions  [phone] [place],  [place] [address]   [facility] Get Driving Directions [phone] [place], [place], [place] [address]   [place] [facility] Get Driving Directions  [phone] [address] [place], [place] [address]   [place] [place] Get Driving Directions [phone] [address] [place], [place] [address]  Your MyChart E-visit questionnaire answers were reviewed by a board certified advanced clinical practitioner to complete your personal care plan based on your specific symptoms.  Thank you for using e-Visits.

## 2022-11-30 NOTE — Progress Notes (Signed)
Because you are having UTI symptoms, with flank pain, and fever, I feel your condition warrants further evaluation and I recommend that you be seen in a face to face visit.   NOTE: There will be NO CHARGE for this eVisit   If you are having a true medical emergency please call 911.      For an urgent face to face visit, [place] has eight urgent care centers for your convenience:   NEW!! [place] [facility] [place] [facility] [place] Get Driving Directions [phone] [address] [place], [place] [facility] [place] [facility] [place] Get Driving Directions [phone] [place], [place] [address]   [place] [facility] [place] [facility]) Get Driving Directions [phone] [address] [place], [place] [address]  [place] [facility] [place] ([place]) Get Driving Directions [phone] [address] [place],  [place]  [address]  [place] [facility] - at [address] Get Driving Directions  [phone] [place],  [place] [address]   [facility] Get Driving Directions [phone] [place], [place], [place] [address]   [place] [facility] Get Driving Directions  [phone] [address] [place], [place] [address]   [facility] [place] Get Driving Directions [phone] [address], [place], [place] [address]  Your MyChart E-visit questionnaire answers were reviewed by a board certified advanced clinical practitioner to complete your personal care plan based on your specific symptoms.  Thank you for using e-Visits.

## 2022-12-01 ENCOUNTER — Telehealth: Payer: 59 | Admitting: Physician Assistant

## 2022-12-01 DIAGNOSIS — Z91199 Patient's noncompliance with other medical treatment and regimen due to unspecified reason: Secondary | ICD-10-CM

## 2022-12-01 NOTE — Progress Notes (Signed)
The patient no-showed for appointment despite this provider sending direct link x 2 with no response and waiting for at least 10 minutes from appointment time for patient to join. They will be marked as a NS for this appointment/time.   Romond Pipkins M Esaul Dorwart, PA-C    

## 2023-01-15 ENCOUNTER — Telehealth: Payer: 59 | Admitting: Physician Assistant

## 2023-01-15 DIAGNOSIS — R3989 Other symptoms and signs involving the genitourinary system: Secondary | ICD-10-CM

## 2023-01-15 MED ORDER — CEPHALEXIN 500 MG PO CAPS: 500.0000 mg | ORAL_CAPSULE | Freq: Two times a day (BID) | ORAL | 0 refills | Status: DC

## 2023-01-15 NOTE — Progress Notes (Signed)

## 2023-03-13 ENCOUNTER — Other Ambulatory Visit: Payer: Self-pay

## 2023-03-13 ENCOUNTER — Other Ambulatory Visit (HOSPITAL_COMMUNITY): Payer: Self-pay

## 2023-03-13 MED ORDER — ZEPBOUND 2.5 MG/0.5ML ~~LOC~~ SOAJ
2.5000 mg | SUBCUTANEOUS | 0 refills | Status: DC
  Filled 2023-03-13: qty 2, 28d supply, fill #0

## 2023-03-15 ENCOUNTER — Encounter (HOSPITAL_COMMUNITY): Payer: Self-pay | Admitting: *Deleted

## 2023-04-05 ENCOUNTER — Other Ambulatory Visit (HOSPITAL_COMMUNITY): Payer: Self-pay

## 2023-04-11 ENCOUNTER — Telehealth: Payer: 59 | Admitting: Nurse Practitioner

## 2023-04-11 DIAGNOSIS — Z20818 Contact with and (suspected) exposure to other bacterial communicable diseases: Secondary | ICD-10-CM | POA: Diagnosis not present

## 2023-04-11 DIAGNOSIS — J069 Acute upper respiratory infection, unspecified: Secondary | ICD-10-CM

## 2023-04-11 MED ORDER — AMOXICILLIN 500 MG PO CAPS: 500.0000 mg | ORAL_CAPSULE | Freq: Two times a day (BID) | ORAL | 0 refills | Status: AC

## 2023-04-11 MED ORDER — IPRATROPIUM BROMIDE 0.03 % NA SOLN: 2.0000 | Freq: Two times a day (BID) | NASAL | 12 refills | Status: DC

## 2023-04-11 NOTE — Progress Notes (Signed)
E-Visit for Upper Respiratory Infection   We are sorry you are not feeling well.  Here is how we plan to help!  Based on what you have shared with me, it looks like you may have a viral upper respiratory infection.  Upper respiratory infections are caused by a large number of viruses; however, rhinovirus is the most common cause.   Symptoms vary from person to person, with common symptoms including sore throat, cough, fatigue or lack of energy and feeling of general discomfort.  A low-grade fever of up to 100.4 may present, but is often uncommon.  Symptoms vary however, and are closely related to a person's age or underlying illnesses.  The most common symptoms associated with an upper respiratory infection are nasal discharge or congestion, cough, sneezing, headache and pressure in the ears and face.  These symptoms usually persist for about 3 to 10 days, but can last up to 2 weeks.  It is important to know that upper respiratory infections do not cause serious illness or complications in most cases.    Upper respiratory infections can be transmitted from person to person, with the most common method of transmission being a person's hands.  The virus is able to live on the skin and can infect other persons for up to 2 hours after direct contact.  Also, these can be transmitted when someone coughs or sneezes; thus, it is important to cover the mouth to reduce this risk.  To keep the spread of the illness at bay, good hand hygiene is very important.  This is an infection that is most likely caused by a virus. There are no specific treatments other than to help you with the symptoms until the infection runs its course.  We are sorry you are not feeling well.  Here is how we plan to help!   For nasal congestion, you may use an oral decongestants such as Mucinex D or if you have glaucoma or high blood pressure use plain Mucinex.  Saline nasal spray or nasal drops can help and can safely be used as often as  needed for congestion.  For your congestion, I have prescribed Ipratropium Bromide nasal spray 0.03% two sprays in each nostril 2-3 times a day  If you do not have a history of heart disease, hypertension, diabetes or thyroid disease, prostate/bladder issues or glaucoma, you may also use Sudafed to treat nasal congestion.  It is highly recommended that you consult with a pharmacist or your primary care physician to ensure this medication is safe for you to take.     If you have a cough, you may use cough suppressants such as Delsym and Robitussin.  If you have glaucoma or high blood pressure, you can also use Coricidin HBP.     If you have a sore or scratchy throat, use a saltwater gargle-  to  teaspoon of salt dissolved in a 4-ounce to 8-ounce glass of warm water.  Gargle the solution for approximately 15-30 seconds and then spit.  It is important not to swallow the solution.  You can also use throat lozenges/cough drops and Chloraseptic spray to help with throat pain or discomfort.  Warm or cold liquids can also be helpful in relieving throat pain. Sore throats associated with nasal congestion are typically from post nasal drainage.   For headache, pain or general discomfort, you can use Ibuprofen or Tylenol as directed.   Some authorities believe that zinc sprays or the use of Echinacea may shorten the  course of your symptoms.   HOME CARE Only take medications as instructed by your medical team. Be sure to drink plenty of fluids. Water is fine as well as fruit juices, sodas and electrolyte beverages. You may want to stay away from caffeine or alcohol. If you are nauseated, try taking small sips of liquids. How do you know if you are getting enough fluid? Your urine should be a pale yellow or almost colorless. Get rest. Taking a steamy shower or using a humidifier may help nasal congestion and ease sore throat pain. You can place a towel over your head and breathe in the steam from hot water  coming from a faucet. Using a saline nasal spray works much the same way. Cough drops, hard candies and sore throat lozenges may ease your cough. Avoid close contacts especially the very young and the elderly Cover your mouth if you cough or sneeze Always remember to wash your hands.   GET HELP RIGHT AWAY IF: You develop worsening fever. If your symptoms do not improve within 10 days You develop yellow or green discharge from your nose over 3 days. You have coughing fits You develop a severe head ache or visual changes. You develop shortness of breath, difficulty breathing or start having chest pain Your symptoms persist after you have completed your treatment plan  MAKE SURE YOU  Understand these instructions. Will watch your condition. Will get help right away if you are not doing well or get worse.  Thank you for choosing an e-visit.  Your e-visit answers were reviewed by a board certified advanced clinical practitioner to complete your personal care plan. Depending upon the condition, your plan could have included both over the counter or prescription medications.  Please review your pharmacy choice. Make sure the pharmacy is open so you can pick up prescription now. If there is a problem, you may contact your provider through [employer] and have the prescription routed to another pharmacy.  Your safety is important to [place]. If you have drug allergies check your prescription carefully.   For the next 24 hours you can use MyChart to ask questions about today's visit, request a non-urgent call back, or ask for a work or school excuse. You will get an email in the next two days asking about your experience. I hope that your e-visit has been valuable and will speed your recovery.  Meds ordered this encounter  Medications   ipratropium (ATROVENT) 0.03 % nasal spray    Sig: Place 2 sprays into both nostrils every 12 (twelve) hours.    Dispense:  30 mL    Refill:  12     I  spent approximately 5 minutes reviewing the patient's history, current symptoms and coordinating their care today.

## 2023-04-11 NOTE — Addendum Note (Signed)
Addended by: Harlow Mares on: 04/11/2023 06:48 PM   Modules accepted: Orders

## 2023-04-13 ENCOUNTER — Other Ambulatory Visit (HOSPITAL_COMMUNITY): Payer: Self-pay

## 2023-04-13 MED ORDER — MOUNJARO 2.5 MG/0.5ML ~~LOC~~ SOAJ
2.5000 mg | SUBCUTANEOUS | 0 refills | Status: DC
  Filled 2023-04-13: qty 2, 28d supply, fill #0

## 2023-04-16 ENCOUNTER — Other Ambulatory Visit (HOSPITAL_COMMUNITY): Payer: Self-pay

## 2023-04-16 MED ORDER — ZEPBOUND 2.5 MG/0.5ML ~~LOC~~ SOAJ
2.5000 mg | SUBCUTANEOUS | 0 refills | Status: DC
  Filled 2023-04-16: qty 2, 28d supply, fill #0

## 2023-04-19 ENCOUNTER — Other Ambulatory Visit (HOSPITAL_COMMUNITY): Payer: Self-pay

## 2023-04-26 ENCOUNTER — Other Ambulatory Visit (HOSPITAL_COMMUNITY): Payer: Self-pay

## 2023-04-27 ENCOUNTER — Other Ambulatory Visit (HOSPITAL_COMMUNITY): Payer: Self-pay

## 2023-06-18 ENCOUNTER — Telehealth: Payer: Self-pay | Admitting: *Deleted

## 2023-06-18 ENCOUNTER — Ambulatory Visit: Payer: 59 | Admitting: Nurse Practitioner

## 2023-06-18 ENCOUNTER — Encounter: Payer: Self-pay | Admitting: Nurse Practitioner

## 2023-06-18 VITALS — BP 132/88 | HR 78

## 2023-06-18 DIAGNOSIS — N644 Mastodynia: Secondary | ICD-10-CM

## 2023-06-18 DIAGNOSIS — D509 Iron deficiency anemia, unspecified: Secondary | ICD-10-CM | POA: Diagnosis not present

## 2023-06-18 DIAGNOSIS — N6452 Nipple discharge: Secondary | ICD-10-CM

## 2023-06-18 LAB — CBC WITH DIFFERENTIAL/PLATELET
Absolute Monocytes: 439 cells/uL (ref 200–950)
Basophils Absolute: 61 cells/uL (ref 0–200)
Basophils Relative: 1 %
Eosinophils Absolute: 171 cells/uL (ref 15–500)
Eosinophils Relative: 2.8 %
HCT: 28.9 % — ABNORMAL LOW (ref 35.0–45.0)
Hemoglobin: 8.6 g/dL — ABNORMAL LOW (ref 11.7–15.5)
Lymphs Abs: 2001 cells/uL (ref 850–3900)
MCH: 22.6 pg — ABNORMAL LOW (ref 27.0–33.0)
MCHC: 29.8 g/dL — ABNORMAL LOW (ref 32.0–36.0)
MCV: 75.9 fL — ABNORMAL LOW (ref 80.0–100.0)
MPV: 11.2 fL (ref 7.5–12.5)
Monocytes Relative: 7.2 %
Neutro Abs: 3428 cells/uL (ref 1500–7800)
Neutrophils Relative %: 56.2 %
Platelets: 419 10*3/uL — ABNORMAL HIGH (ref 140–400)
RBC: 3.81 10*6/uL (ref 3.80–5.10)
RDW: 16.4 % — ABNORMAL HIGH (ref 11.0–15.0)
Total Lymphocyte: 32.8 %
WBC: 6.1 10*3/uL (ref 3.8–10.8)

## 2023-06-18 NOTE — Telephone Encounter (Signed)
-----   Message from Olivia Mackie sent at 06/18/2023  9:06 AM EDT ----- Regarding: Diagnostic imaging Please send referral for diagnostic imaging of left breast for pain and nipple discharge. Has not had screening mammogram.

## 2023-06-18 NOTE — Progress Notes (Signed)
   Acute Office Visit  Subjective:    Patient ID: Julie Mcneil, female    DOB: 11/21/1980, 42 y.o.   MRN: [id number]   HPI 42 y.o. [id number] presents today for intermittent left breast pain x 6-7 weeks. Becoming more consistent. Initially, pain was sharp/shooting from outer breast to nipple and now more of a dull pain, tender with palpation. Has not noticed any lumps. Has seen very small amount of clear nipple discharge in left breast only. No trauma to area. Has not had screening mammogram yet. No family history of breast cancer. H/O iron deficiency anemia. Would like levels checked today. Last done in 2022. Taking OTC iron supplement daily. Has been eating ice more.   No LMP recorded.    Review of Systems  Constitutional: Negative.   Hematological:  Negative for adenopathy.  Left breast: Positive for pain and nipple discharge. Negative for lump, redness, swelling or skin changes Right breast: Negative.     Objective:    Physical Exam Constitutional:      Appearance: Normal appearance.  Chest:  Breasts:    Right: Normal.     Left: Tenderness present. No swelling, inverted nipple, mass, nipple discharge or skin change.    Lymphadenopathy:     Upper Body:     Right upper body: No supraclavicular or axillary adenopathy.     Left upper body: No supraclavicular or axillary adenopathy.     BP 132/88   Pulse 78   SpO2 99%  Wt Readings from Last 3 Encounters:  01/18/22 222 lb 8 oz (100.9 kg)  10/17/21 238 lb 4.8 oz (108.1 kg)  08/31/21 257 lb 9.6 oz (116.8 kg)        Patient informed chaperone available to be present for breast and/or pelvic exam. Patient has requested no chaperone to be present. Patient has been advised what will be completed during breast and pelvic exam.   Assessment & Plan:   Problem List Items Addressed This Visit       Other   Absolute anemia   Relevant Orders   CBC w/Diff   Iron, TIBC and Ferritin Panel   Other Visit Diagnoses     Pain  of left breast    -  Primary   Nipple discharge       Relevant Orders   Prolactin      Plan: No definitive mass or nipple discharge present today, tender on exam. Will send referral for diagnostic imaging. Prolactin, CBC, iron panel today.      Olivia Mackie DNP, 9:08 AM 06/18/2023

## 2023-06-18 NOTE — Telephone Encounter (Signed)
Call placed to [facility], spoke with Laquita. Patient has not had screening MMG, scheduled for Bilateral Dx MMG and Left breast US on 07/03/23, 0830, arrive at 0810.   Spoke with patient, advised of appt as seen above. Patient verbalizes understanding and is agreeable.   Routing to provider for final review. Patient is agreeable to disposition. Will close encounter.

## 2023-06-19 ENCOUNTER — Other Ambulatory Visit (HOSPITAL_COMMUNITY): Payer: Self-pay

## 2023-06-19 ENCOUNTER — Other Ambulatory Visit: Payer: Self-pay | Admitting: Nurse Practitioner

## 2023-06-19 DIAGNOSIS — D509 Iron deficiency anemia, unspecified: Secondary | ICD-10-CM

## 2023-06-19 MED ORDER — FERROUS SULFATE 325 (65 FE) MG PO TBEC: 325.0000 mg | DELAYED_RELEASE_TABLET | Freq: Two times a day (BID) | ORAL | 0 refills | Status: DC

## 2023-06-21 ENCOUNTER — Other Ambulatory Visit (HOSPITAL_COMMUNITY): Payer: Self-pay

## 2023-06-21 MED ORDER — ZEPBOUND 5 MG/0.5ML ~~LOC~~ SOAJ
5.0000 mg | SUBCUTANEOUS | 0 refills | Status: DC
  Filled 2023-06-21 – 2023-06-30 (×2): qty 2, 28d supply, fill #0

## 2023-06-28 ENCOUNTER — Other Ambulatory Visit (HOSPITAL_COMMUNITY): Payer: Self-pay

## 2023-06-28 MED ORDER — PANTOPRAZOLE SODIUM 40 MG PO TBEC
40.0000 mg | DELAYED_RELEASE_TABLET | Freq: Every day | ORAL | 2 refills | Status: AC
  Filled 2023-06-28 – 2023-06-30 (×2): qty 30, 30d supply, fill #0
  Filled 2023-09-29 (×2): qty 30, 30d supply, fill #1

## 2023-06-30 ENCOUNTER — Other Ambulatory Visit (HOSPITAL_COMMUNITY): Payer: Self-pay

## 2023-06-30 ENCOUNTER — Other Ambulatory Visit (HOSPITAL_BASED_OUTPATIENT_CLINIC_OR_DEPARTMENT_OTHER): Payer: Self-pay

## 2023-07-03 ENCOUNTER — Ambulatory Visit
Admission: RE | Admit: 2023-07-03 | Discharge: 2023-07-03 | Disposition: A | Discharge status: Home / Self Care | Payer: 59 | Source: Ambulatory Visit | Attending: Nurse Practitioner | Admitting: Nurse Practitioner

## 2023-07-03 ENCOUNTER — Other Ambulatory Visit (HOSPITAL_COMMUNITY): Payer: Self-pay

## 2023-07-03 ENCOUNTER — Ambulatory Visit: Payer: 59 | Admitting: Nurse Practitioner

## 2023-07-03 DIAGNOSIS — N644 Mastodynia: Secondary | ICD-10-CM

## 2023-07-03 DIAGNOSIS — N6452 Nipple discharge: Secondary | ICD-10-CM

## 2023-07-15 ENCOUNTER — Other Ambulatory Visit (HOSPITAL_BASED_OUTPATIENT_CLINIC_OR_DEPARTMENT_OTHER): Payer: Self-pay

## 2023-07-15 ENCOUNTER — Encounter: Payer: Self-pay | Admitting: Nurse Practitioner

## 2023-07-16 NOTE — Telephone Encounter (Signed)
Encounter closed. Patient called.

## 2023-07-26 ENCOUNTER — Other Ambulatory Visit (HOSPITAL_COMMUNITY): Payer: Self-pay

## 2023-07-26 ENCOUNTER — Other Ambulatory Visit: Payer: Self-pay | Admitting: Nurse Practitioner

## 2023-07-26 DIAGNOSIS — Z0289 Encounter for other administrative examinations: Secondary | ICD-10-CM

## 2023-07-26 DIAGNOSIS — B359 Dermatophytosis, unspecified: Secondary | ICD-10-CM

## 2023-07-26 MED ORDER — ZEPBOUND 7.5 MG/0.5ML ~~LOC~~ SOAJ
7.5000 mg | SUBCUTANEOUS | 0 refills | Status: DC
Start: 2023-07-26 — End: 2023-08-15
  Filled 2023-07-26: qty 2, 28d supply, fill #0

## 2023-07-27 ENCOUNTER — Other Ambulatory Visit (HOSPITAL_COMMUNITY): Payer: Self-pay

## 2023-07-30 ENCOUNTER — Other Ambulatory Visit (HOSPITAL_BASED_OUTPATIENT_CLINIC_OR_DEPARTMENT_OTHER): Payer: Self-pay

## 2023-07-31 ENCOUNTER — Other Ambulatory Visit (HOSPITAL_COMMUNITY): Payer: Self-pay

## 2023-08-11 ENCOUNTER — Other Ambulatory Visit (HOSPITAL_COMMUNITY): Payer: Self-pay

## 2023-08-15 ENCOUNTER — Other Ambulatory Visit (HOSPITAL_BASED_OUTPATIENT_CLINIC_OR_DEPARTMENT_OTHER): Payer: Self-pay

## 2023-08-15 ENCOUNTER — Encounter (HOSPITAL_COMMUNITY): Payer: Self-pay

## 2023-08-15 ENCOUNTER — Encounter (INDEPENDENT_AMBULATORY_CARE_PROVIDER_SITE_OTHER): Payer: Self-pay | Admitting: Family Medicine

## 2023-08-15 ENCOUNTER — Ambulatory Visit (INDEPENDENT_AMBULATORY_CARE_PROVIDER_SITE_OTHER): Payer: 59 | Admitting: Family Medicine

## 2023-08-15 ENCOUNTER — Other Ambulatory Visit: Payer: Self-pay

## 2023-08-15 ENCOUNTER — Other Ambulatory Visit (HOSPITAL_COMMUNITY): Payer: Self-pay

## 2023-08-15 VITALS — BP 107/72 | HR 80 | Temp 97.7°F | Ht 64.0 in | Wt 183.0 lb

## 2023-08-15 DIAGNOSIS — Z1331 Encounter for screening for depression: Secondary | ICD-10-CM

## 2023-08-15 DIAGNOSIS — E559 Vitamin D deficiency, unspecified: Secondary | ICD-10-CM

## 2023-08-15 DIAGNOSIS — Z9884 Bariatric surgery status: Secondary | ICD-10-CM

## 2023-08-15 DIAGNOSIS — R7303 Prediabetes: Secondary | ICD-10-CM | POA: Diagnosis not present

## 2023-08-15 DIAGNOSIS — E538 Deficiency of other specified B group vitamins: Secondary | ICD-10-CM | POA: Diagnosis not present

## 2023-08-15 DIAGNOSIS — R0602 Shortness of breath: Secondary | ICD-10-CM | POA: Diagnosis not present

## 2023-08-15 DIAGNOSIS — R5383 Other fatigue: Secondary | ICD-10-CM | POA: Diagnosis not present

## 2023-08-15 DIAGNOSIS — D508 Other iron deficiency anemias: Secondary | ICD-10-CM | POA: Diagnosis not present

## 2023-08-15 DIAGNOSIS — Z6831 Body mass index (BMI) 31.0-31.9, adult: Secondary | ICD-10-CM

## 2023-08-15 DIAGNOSIS — E669 Obesity, unspecified: Secondary | ICD-10-CM

## 2023-08-15 MED ORDER — FUSION PLUS PO CAPS
2.0000 | ORAL_CAPSULE | Freq: Every day | ORAL | 0 refills | Status: DC
  Filled 2023-08-15 – 2023-08-29 (×2): qty 60, 30d supply, fill #0
  Filled 2023-09-29: qty 60, 30d supply, fill #1

## 2023-08-15 MED ORDER — ZEPBOUND 5 MG/0.5ML ~~LOC~~ SOAJ
5.0000 mg | SUBCUTANEOUS | 0 refills | Status: DC
Start: 2023-08-15 — End: 2023-08-29
  Filled 2023-08-15 (×2): qty 2, 28d supply, fill #0

## 2023-08-15 NOTE — Telephone Encounter (Signed)
Please advise 

## 2023-08-15 NOTE — Progress Notes (Signed)
Chief Complaint:   OBESITY Julie Mcneil (MR# [id number]) is a 42 y.o. female who presents for evaluation and treatment of obesity and related comorbidities. Current BMI is Body mass index is 31.41 kg/m. Julie Mcneil has been struggling with her weight for many years and has been unsuccessful in either losing weight, maintaining weight loss, or reaching her healthy weight goal.  Julie Mcneil is currently in the action stage of change and ready to dedicate time achieving and maintaining a healthier weight. Julie Mcneil is interested in becoming our patient and working on intensive lifestyle modifications including (but not limited to) diet and exercise for weight loss.  Returning patient.  She was seen in 2022 and then underwent sleeve gastrectomy.  She lost from 271 to around 200lbs before stalling.  She was placed Zepbound and is now down to 183. She is on Zepbound 7.5mg .  History of 4 pregnancies- 3 c-sections, 1 tubal. 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 Lives at home with her husband Julie Mcneil, 3 daughters Julie Mcneil, Julie Mcneil and Julie Mcneil.  Family is supportive of her and they eat meals together.  Desired weight 145lb- last time she was that weight was perhaps in high school. Biggest obstacle to cooking is time. Tries to meal prep for the week.  She feels she is successful in her meal prepping. Occasionally she skips dinner.   Food Recall: Protein coffee premiere with coffee.  Takes her 2-3 hours to drink it all. Around 11-12 she will have boiled eggs or rolled up [place] to subs but no bread- she will get 3 inch sub and eat inside and may or may not have a couple of fries.  Feels full.  May have a mid afternoon snack and will have power crunch red velvet or trail mix in a bag. Dinner is 6/6:30 and sometimes she is hungry and other times she isn't.  She will eat chicken, ground [place], fish.  Sinclaire's habits were reviewed today and are as follows: Her  family eats meals together, she thinks her family will eat healthier with her, her desired weight loss is 38 lbs, she has been heavy most of her life, she started gaining excessive weight as a kid (on and off from there), her heaviest weight ever was 286 pounds, she has significant food cravings issues, she skips meals frequently, she is frequently drinking liquids with calories, and she struggles with emotional eating.  Depression Screen Edithe's Food and Mood (modified PHQ-9) score was 4.  Subjective:   1. Other fatigue Sharnise admits to daytime somnolence and admits to waking up still tired. Patient has a history of symptoms of daytime fatigue, morning fatigue, and morning headache. Julie Mcneil generally gets 6 or 8 hours of sleep per night, and states that she has nightime awakenings and generally restful sleep. Snoring is not present. Apneic episodes are not present. Epworth Sleepiness Score is 4.  EKG-normal sinus rhythm at 79 bpm.  2. SOBOE (shortness of breath on exertion) Lanora Manis notes increasing shortness of breath with exercising and seems to be worsening over time with weight gain. She notes getting out of breath sooner with activity than she used to. This has not gotten worse recently. Ardath denies shortness of breath at rest or orthopnea.  3. S/P laparoscopic sleeve gastrectomy Patient has surgery on 08/16/2021; starting weight of 271.  Her current weight is 183, and she stalled at 200 then started Zepbound.  4. Other iron deficiency anemia Patient's last MCV  was within normal limits, hemoglobin low, iron low at 23, and ferritin 2, and percent sat 5.  5. Prediabetes Patient has been prediabetic in the past, most recent A1c was 5.5 two years ago.  She is on GLP/GIP medication.  6. Vitamin D deficiency Patient is on daily multivitamins, and she notes fatigue.  7. Vitamin B12 deficiency Patient's diagnosis was prior to surgery.  She is on multivitamins  daily.  Assessment/Plan:   1. Other fatigue Amara does feel that her weight is causing her energy to be lower than it should be. Fatigue may be related to obesity, depression or many other causes. Labs will be ordered, and in the meanwhile, Gennifer will focus on self care including making healthy food choices, increasing physical activity and focusing on stress reduction.  - EKG 12-Lead - T4, free - T3 - TSH  2. SOBOE (shortness of breath on exertion) Jenell does feel that she gets out of breath more easily that she used to when she exercises. Julie Mcneil's shortness of breath appears to be obesity related and exercise induced. She has agreed to work on weight loss and gradually increase exercise to treat her exercise induced shortness of breath. Will continue to monitor closely.  - Lipid Panel With LDL/HDL Ratio  3. S/P laparoscopic sleeve gastrectomy We will follow-up on a restriction and quantity of food at patient's next appointment.  4. Other iron deficiency anemia We will check labs today, and patient will change to Fusion Plus 2 capsules daily with no refills.  - CBC with Differential/Platelet - Folate - Iron-FA-B Cmp-C-Biot-Probiotic (FUSION PLUS) CAPS; Take 2 capsules by mouth daily.  Dispense: 180 capsule; Refill: 0 - Anemia panel  5. Prediabetes We will check labs today, we will follow-up at patient's next appointment.  - Comprehensive metabolic panel - Hemoglobin A1c - Insulin, random  6. Vitamin D deficiency We will check labs today, we will follow-up at patient's next appointment.  - VITAMIN D 25 Hydroxy (Vit-D Deficiency, Fractures)  7. Vitamin B12 deficiency We will check labs today, we will follow-up at patient's next appointment.  - Vitamin B12  8. Depression screening Julie Mcneil had a negative depression screening.   9. BMI 31.0-31.9,adult  10. Obesity with starting BMI of 46.8 Julie Mcneil is currently in the action stage of change and her  goal is to continue with weight loss efforts. I recommend Lace begin the structured treatment plan as follows:  She has agreed to the Category 1 Plan and keeping a food journal and adhering to recommended goals of 350-450 calories and 30+ grams of protein at supper daily.  Exercise goals: All adults should avoid inactivity. Some physical activity is better than none, and adults who participate in any amount of physical activity gain some health benefits.   Behavioral modification strategies: increasing lean protein intake, meal planning and cooking strategies, keeping healthy foods in the home, and planning for success.  She was informed of the importance of frequent follow-up visits to maximize her success with intensive lifestyle modifications for her multiple health conditions. She was informed we would discuss her lab results at her next visit unless there is a critical issue that needs to be addressed sooner. Jannel agreed to keep her next visit at the agreed upon time to discuss these results.  Objective:   Blood pressure 107/72, pulse 80, temperature 97.7 F (36.5 C), height 5\' 4"  (1.626 m), weight 183 lb (83 kg), last menstrual period 08/03/2023, SpO2 100%. Body mass index is 31.41 kg/m.  EKG:  Normal sinus rhythm, rate 79 BPM.  Indirect Calorimeter completed today shows a VO2 of 181 and a REE of 1253.  Her calculated basal metabolic rate is 1610 thus her basal metabolic rate is worse than expected.  General: Cooperative, alert, well developed, in no acute distress. HEENT: Conjunctivae and lids unremarkable. Cardiovascular: Regular rhythm.  Lungs: Normal work of breathing. Neurologic: No focal deficits.   Lab Results  Component Value Date   CREATININE 0.61 08/17/2021   BUN 8 08/17/2021   NA 135 08/17/2021   K 4.0 08/17/2021   CL 104 08/17/2021   CO2 21 (L) 08/17/2021   Lab Results  Component Value Date   ALT 17 08/17/2021   AST 19 08/17/2021   ALKPHOS 61  08/17/2021   BILITOT 0.7 08/17/2021   Lab Results  Component Value Date   HGBA1C 5.5 08/10/2021   HGBA1C 5.7 (H) 06/09/2020   HGBA1C 5.7 (H) 01/08/2020   HGBA1C 5.5 04/09/2017   HGBA1C 5.6 09/20/2015   Lab Results  Component Value Date   INSULIN 17.0 06/09/2020   INSULIN 18.0 01/08/2020   Lab Results  Component Value Date   TSH 1.990 01/08/2020   Lab Results  Component Value Date   CHOL 201 (H) 06/09/2020   HDL 51 06/09/2020   LDLCALC 125 (H) 06/09/2020   TRIG 138 06/09/2020   CHOLHDL 3.7 01/08/2020   Lab Results  Component Value Date   WBC 6.1 06/18/2023   HGB 8.6 (L) 06/18/2023   HCT 28.9 (L) 06/18/2023   MCV 75.9 (L) 06/18/2023   PLT 419 (H) 06/18/2023   Lab Results  Component Value Date   IRON 23 (L) 06/18/2023   TIBC 425 06/18/2023   FERRITIN 2 (L) 06/18/2023   Attestation Statements:   Reviewed by clinician on day of visit: allergies, medications, problem list, medical history, surgical history, family history, social history, and previous encounter notes.  Time spent on visit including pre-visit chart review and post-visit charting and care was 45 minutes.   I, Burt Knack, am acting as transcriptionist for [name], MD.  This is the patient's first visit at [facility]. The patient's NEW PATIENT PACKET was reviewed at length. Included in the packet: current and past health history, medications, allergies, ROS, gynecologic history (women only), surgical history, family history, social history, weight history, weight loss surgery history (for those that have had weight loss surgery), nutritional evaluation, mood and food questionnaire, PHQ9, Epworth questionnaire, sleep habits questionnaire, patient life and health improvement goals questionnaire. These will all be scanned into the patient's chart under media.   During the visit, I independently reviewed the patient's EKG, bioimpedance scale results, and indirect calorimeter  results. I used this information to tailor a meal plan for the patient that will help her to lose weight and will improve her obesity-related conditions going forward. I performed a medically necessary appropriate examination and/or evaluation. I discussed the assessment and treatment plan with the patient. The patient was provided an opportunity to ask questions and all were answered. The patient agreed with the plan and demonstrated an understanding of the instructions. Labs were ordered at this visit and will be reviewed at the next visit unless more critical results need to be addressed immediately. Clinical information was updated and documented in the EMR.    I have reviewed the above documentation for accuracy and completeness, and I agree with the above. - [name], MD

## 2023-08-18 LAB — COMPREHENSIVE METABOLIC PANEL
ALT: 8 [IU]/L (ref 0–32)
AST: 12 [IU]/L (ref 0–40)
Albumin: 4.2 g/dL (ref 3.9–4.9)
Alkaline Phosphatase: 84 [IU]/L (ref 44–121)
BUN/Creatinine Ratio: 11 (ref 9–23)
BUN: 7 mg/dL (ref 6–24)
Bilirubin Total: 1.1 mg/dL (ref 0.0–1.2)
CO2: 23 mmol/L (ref 20–29)
Calcium: 9.3 mg/dL (ref 8.7–10.2)
Chloride: 105 mmol/L (ref 96–106)
Creatinine, Ser: 0.66 mg/dL (ref 0.57–1.00)
Globulin, Total: 2.6 g/dL (ref 1.5–4.5)
Glucose: 80 mg/dL (ref 70–99)
Potassium: 4.5 mmol/L (ref 3.5–5.2)
Sodium: 142 mmol/L (ref 134–144)
Total Protein: 6.8 g/dL (ref 6.0–8.5)
eGFR: 113 mL/min/{1.73_m2} (ref >59)

## 2023-08-18 LAB — ANEMIA PANEL
Ferritin: 6 ng/mL — ABNORMAL LOW (ref 15–150)
Hematocrit: 34.4 % (ref 34.0–46.6)
Iron Saturation: 8 % — CL (ref 15–55)
Iron: 28 ug/dL (ref 27–159)
Retic Ct Pct: 0.7 % (ref 0.6–2.6)
Total Iron Binding Capacity: 331 ug/dL (ref 250–450)
UIBC: 303 ug/dL (ref 131–425)
Vitamin B-12: 732 pg/mL (ref 232–1245)

## 2023-08-18 LAB — CBC WITH DIFFERENTIAL/PLATELET
Basophils Absolute: 0.1 10*3/uL (ref 0.0–0.2)
Basos: 1 %
EOS (ABSOLUTE): 0.1 10*3/uL (ref 0.0–0.4)
Eos: 2 %
Hemoglobin: 10.1 g/dL — ABNORMAL LOW (ref 11.1–15.9)
Immature Grans (Abs): 0 10*3/uL (ref 0.0–0.1)
Immature Granulocytes: 0 %
Lymphocytes Absolute: 1.7 10*3/uL (ref 0.7–3.1)
Lymphs: 29 %
MCH: 23.5 pg — ABNORMAL LOW (ref 26.6–33.0)
MCHC: 29.4 g/dL — ABNORMAL LOW (ref 31.5–35.7)
MCV: 80 fL (ref 79–97)
Monocytes Absolute: 0.3 10*3/uL (ref 0.1–0.9)
Monocytes: 6 %
Neutrophils Absolute: 3.6 10*3/uL (ref 1.4–7.0)
Neutrophils: 62 %
Platelets: 379 10*3/uL (ref 150–450)
RBC: 4.3 x10E6/uL (ref 3.77–5.28)
RDW: 17.4 % — ABNORMAL HIGH (ref 11.7–15.4)
WBC: 5.8 10*3/uL (ref 3.4–10.8)

## 2023-08-18 LAB — FOLATE: Folate: 5.4 ng/mL (ref >3.0)

## 2023-08-18 LAB — LIPID PANEL WITH LDL/HDL RATIO
Cholesterol, Total: 170 mg/dL (ref 100–199)
HDL: 56 mg/dL (ref >39)
LDL Chol Calc (NIH): 99 mg/dL (ref 0–99)
LDL/HDL Ratio: 1.8 {ratio} (ref 0.0–3.2)
Triglycerides: 78 mg/dL (ref 0–149)
VLDL Cholesterol Cal: 15 mg/dL (ref 5–40)

## 2023-08-18 LAB — T4, FREE: Free T4: 1.1 ng/dL (ref 0.82–1.77)

## 2023-08-18 LAB — VITAMIN D 25 HYDROXY (VIT D DEFICIENCY, FRACTURES): Vit D, 25-Hydroxy: 31.4 ng/mL (ref 30.0–100.0)

## 2023-08-18 LAB — TSH: TSH: 1.6 u[IU]/mL (ref 0.450–4.500)

## 2023-08-18 LAB — INSULIN, RANDOM: INSULIN: 5.8 u[IU]/mL (ref 2.6–24.9)

## 2023-08-18 LAB — T3: T3, Total: 88 ng/dL (ref 71–180)

## 2023-08-18 LAB — HEMOGLOBIN A1C
Est. average glucose Bld gHb Est-mCnc: 108 mg/dL
Hgb A1c MFr Bld: 5.4 % (ref 4.8–5.6)

## 2023-08-28 ENCOUNTER — Other Ambulatory Visit (HOSPITAL_BASED_OUTPATIENT_CLINIC_OR_DEPARTMENT_OTHER): Payer: Self-pay

## 2023-08-29 ENCOUNTER — Encounter (INDEPENDENT_AMBULATORY_CARE_PROVIDER_SITE_OTHER): Payer: Self-pay | Admitting: Family Medicine

## 2023-08-29 ENCOUNTER — Ambulatory Visit (INDEPENDENT_AMBULATORY_CARE_PROVIDER_SITE_OTHER): Payer: 59 | Admitting: Family Medicine

## 2023-08-29 ENCOUNTER — Other Ambulatory Visit: Payer: Self-pay

## 2023-08-29 ENCOUNTER — Other Ambulatory Visit (HOSPITAL_BASED_OUTPATIENT_CLINIC_OR_DEPARTMENT_OTHER): Payer: Self-pay

## 2023-08-29 VITALS — BP 120/28 | HR 78 | Temp 98.0°F | Ht 64.0 in | Wt 179.0 lb

## 2023-08-29 DIAGNOSIS — E559 Vitamin D deficiency, unspecified: Secondary | ICD-10-CM | POA: Diagnosis not present

## 2023-08-29 DIAGNOSIS — E669 Obesity, unspecified: Secondary | ICD-10-CM | POA: Diagnosis not present

## 2023-08-29 DIAGNOSIS — D508 Other iron deficiency anemias: Secondary | ICD-10-CM

## 2023-08-29 DIAGNOSIS — R7303 Prediabetes: Secondary | ICD-10-CM

## 2023-08-29 DIAGNOSIS — Z6830 Body mass index (BMI) 30.0-30.9, adult: Secondary | ICD-10-CM

## 2023-08-29 MED ORDER — VITAMIN D (ERGOCALCIFEROL) 1.25 MG (50000 UNIT) PO CAPS
50000.0000 [IU] | ORAL_CAPSULE | ORAL | 0 refills | Status: DC
Start: 2023-08-29 — End: 2023-09-18
  Filled 2023-08-29: qty 4, 28d supply, fill #0

## 2023-08-29 MED ORDER — ZEPBOUND 5 MG/0.5ML ~~LOC~~ SOAJ
5.0000 mg | SUBCUTANEOUS | 0 refills | Status: DC
  Filled 2023-08-29: qty 2, 28d supply, fill #0

## 2023-08-29 NOTE — Progress Notes (Signed)
Chief Complaint:   OBESITY Julie Mcneil is here to discuss her progress with her obesity treatment plan along with follow-up of her obesity related diagnoses. Julie Mcneil is on the Category 1 Plan and keeping a food journal and adhering to recommended goals of 350-450 calories and 30+ grams of protein at supper and states she is following her eating plan approximately 95% of the time. Julie Mcneil states she is walking 1-2 miles 7 times per week.    Today's visit was #: 2 Starting weight: 183 lbs Starting date: 08/15/2023 Today's weight: 179 lbs Today's date: 08/29/2023 Total lbs lost to date: 4 Total lbs lost since last in-office visit: 4  Interim History: Patient voices she ate more and is surprised she lost weight.  She ate more than she normally does. She ate smaller meals more often over the last few weeks. She is starting to feel hungry.   She has not journaled due to time restraints.  Denies any changes needing to be made in the next few weeks.  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    Subjective:   1. Other iron deficiency anemia Patient's recent iron level was 28, ferritin 6, iron saturation 8, MCV 80, hemoglobin 10.1, and RDW 17.4.  She is on Fusion Plus but it is not covered by her insurance.  2. Vitamin D deficiency Patient is not on vitamin D, and she notes fatigue.  She is on an OTC multivitamin.  She was previously on prescription vitamin D with no issues.  3. Prediabetes Patient's recent A1c was within normal limits and insulin within normal limits.  She is on incretin therapy with no GI side effects noted.  Assessment/Plan:   1. Other iron deficiency anemia Patient will continue Fusion Plus, and a 58-month supply was sent in.  We will follow-up on her labs in 3 months.  2. Vitamin D deficiency Patient agreed to start prescription vitamin D 50,000 IU every 7 days with no refills.  - Vitamin D, Ergocalciferol, (DRISDOL) 1.25 MG (50000 UNIT) CAPS capsule; Take 1  capsule (50,000 Units total) by mouth every 7 (seven) days.  Dispense: 4 capsule; Refill: 0  3. Prediabetes Patient will continue Zepbound with no change in dose.  4. BMI 30.0-30.9,adult  5. Obesity with starting BMI of 46.8 We will refill Zepbound 5 mg subcu weekly for 1 month.  - tirzepatide (ZEPBOUND) 5 MG/0.5ML Pen; Inject 5 mg into the skin once a week.  Dispense: 2 mL; Refill: 0  Julie Mcneil is currently in the action stage of change. As such, her goal is to continue with weight loss efforts. She has agreed to the Category 1 Plan.   Exercise goals: No exercise has been prescribed at this time.  Behavioral modification strategies: increasing lean protein intake, meal planning and cooking strategies, keeping healthy foods in the home, and planning for success.  Julie Mcneil has agreed to follow-up with our clinic in 2 to 3 weeks. She was informed of the importance of frequent follow-up visits to maximize her success with intensive lifestyle modifications for her multiple health conditions.   Objective:   Blood pressure (!) 120/28, pulse 78, temperature 98 F (36.7 C), height 5\' 4"  (1.626 m), weight 179 lb (81.2 kg), last menstrual period 08/03/2023, SpO2 100%. Body mass index is 30.73 kg/m.  General: Cooperative, alert, well developed, in no acute distress. HEENT: Conjunctivae and lids unremarkable. Cardiovascular: Regular rhythm.  Lungs: Normal work of breathing. Neurologic: No focal deficits.   Lab  Results  Component Value Date   CREATININE 0.66 08/15/2023   BUN 7 08/15/2023   NA 142 08/15/2023   K 4.5 08/15/2023   CL 105 08/15/2023   CO2 23 08/15/2023   Lab Results  Component Value Date   ALT 8 08/15/2023   AST 12 08/15/2023   ALKPHOS 84 08/15/2023   BILITOT 1.1 08/15/2023   Lab Results  Component Value Date   HGBA1C 5.4 08/15/2023   HGBA1C 5.5 08/10/2021   HGBA1C 5.7 (H) 06/09/2020   HGBA1C 5.7 (H) 01/08/2020   HGBA1C 5.5 04/09/2017   Lab Results   Component Value Date   INSULIN 5.8 08/15/2023   INSULIN 17.0 06/09/2020   INSULIN 18.0 01/08/2020   Lab Results  Component Value Date   TSH 1.600 08/15/2023   Lab Results  Component Value Date   CHOL 170 08/15/2023   HDL 56 08/15/2023   LDLCALC 99 08/15/2023   TRIG 78 08/15/2023   CHOLHDL 3.7 01/08/2020   Lab Results  Component Value Date   VD25OH 31.4 08/15/2023   VD25OH 25.1 (L) 06/09/2020   VD25OH 25.0 (L) 01/08/2020   Lab Results  Component Value Date   WBC 5.8 08/15/2023   HGB 10.1 (L) 08/15/2023   HCT 34.4 08/15/2023   MCV 80 08/15/2023   PLT 379 08/15/2023   Lab Results  Component Value Date   IRON 28 08/15/2023   TIBC 331 08/15/2023   FERRITIN 6 (L) 08/15/2023   Attestation Statements:   Reviewed by clinician on day of visit: allergies, medications, problem list, medical history, surgical history, family history, social history, and previous encounter notes.  Time spent on visit including pre-visit chart review and post-visit care and charting was 45 minutes.   I, Burt Knack, am acting as transcriptionist for [name], MD.  I have reviewed the above documentation for accuracy and completeness, and I agree with the above. - [name], MD

## 2023-08-30 ENCOUNTER — Encounter: Payer: Self-pay | Admitting: Nurse Practitioner

## 2023-08-30 ENCOUNTER — Other Ambulatory Visit (HOSPITAL_COMMUNITY): Payer: Self-pay

## 2023-08-30 ENCOUNTER — Other Ambulatory Visit: Payer: Self-pay

## 2023-09-01 ENCOUNTER — Other Ambulatory Visit (HOSPITAL_COMMUNITY): Payer: Self-pay

## 2023-09-11 ENCOUNTER — Ambulatory Visit (INDEPENDENT_AMBULATORY_CARE_PROVIDER_SITE_OTHER): Payer: 59 | Admitting: Nurse Practitioner

## 2023-09-11 ENCOUNTER — Other Ambulatory Visit (HOSPITAL_COMMUNITY)
Admission: RE | Admit: 2023-09-11 | Discharge: 2023-09-11 | Disposition: A | Discharge status: Home / Self Care | Payer: 59 | Source: Ambulatory Visit | Attending: Nurse Practitioner | Admitting: Nurse Practitioner

## 2023-09-11 ENCOUNTER — Other Ambulatory Visit (HOSPITAL_BASED_OUTPATIENT_CLINIC_OR_DEPARTMENT_OTHER): Payer: Self-pay

## 2023-09-11 VITALS — BP 120/68 | HR 74 | Ht 64.0 in | Wt 185.0 lb

## 2023-09-11 DIAGNOSIS — N760 Acute vaginitis: Secondary | ICD-10-CM | POA: Diagnosis not present

## 2023-09-11 DIAGNOSIS — L292 Pruritus vulvae: Secondary | ICD-10-CM

## 2023-09-11 DIAGNOSIS — Z01419 Encounter for gynecological examination (general) (routine) without abnormal findings: Secondary | ICD-10-CM | POA: Diagnosis not present

## 2023-09-11 DIAGNOSIS — Z124 Encounter for screening for malignant neoplasm of cervix: Secondary | ICD-10-CM | POA: Insufficient documentation

## 2023-09-11 DIAGNOSIS — B9689 Other specified bacterial agents as the cause of diseases classified elsewhere: Secondary | ICD-10-CM

## 2023-09-11 LAB — WET PREP FOR TRICH, YEAST, CLUE

## 2023-09-11 MED ORDER — METRONIDAZOLE 0.75 % VA GEL
1.0000 | Freq: Every day | VAGINAL | 0 refills | Status: AC
Start: 2023-09-11 — End: 2023-09-16
  Filled 2023-09-11: qty 70, 7d supply, fill #0

## 2023-09-11 NOTE — Progress Notes (Signed)
Julie Mcneil [birth year] [id number]   History:  42 y.o. [id number] presents for annual exam. Monthly cycles. Normal pap history. Complains of vulvar irritation without discharge or odor. 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   Gynecologic History Patient's last menstrual period was 08/31/2023. Period Cycle (Days): 28 Period Duration (Days): 8 Period Pattern: Regular Menstrual Flow: Heavy Menstrual Control: Tampon, Maxi pad Menstrual Control Change Freq (Hours): 2 Dysmenorrhea: None Contraception/Family planning: tubal ligation Sexually active: Yes  Health Maintenance Last Pap: 04/09/2017. Results were: Normal neg HPV, 5-year repeat Last mammogram: 07/03/2023 (diagnostic). Results were: Normal Last colonoscopy: Not indicated Last Dexa: Not indicated   Past medical history, past surgical history, family history and social history were all reviewed and documented in the EPIC chart. Married. Works in [employer] for [employer]. 3 daughters.  ROS:  A ROS was performed and pertinent positives and negatives are included.  Exam:  Vitals:   09/11/23 1452  BP: 120/68  Pulse: 74  SpO2: 100%  Weight: 185 lb (83.9 kg)  Height: 5\' 4"  (1.626 m)    Body mass index is 31.76 kg/m.  General appearance:  Normal Thyroid:  Symmetrical, normal in size, without palpable masses or nodularity. Respiratory  Auscultation:  Clear without wheezing or rhonchi Cardiovascular  Auscultation:  Regular rate, without rubs, murmurs or gallops  Edema/varicosities:  Not grossly evident Abdominal  Soft,nontender, without masses, guarding or rebound.  Liver/spleen:  No organomegaly noted  Hernia:  None appreciated  Skin  Inspection:  Redness, excoriation to bilateral groin Breasts: Examined lying and sitting.   Right: Without masses, retractions, nipple discharge or axillary adenopathy.   Left: Without masses, retractions, nipple discharge or axillary adenopathy. Pelvic: External genitalia:  no lesions               Urethra:  normal appearing urethra with no masses, tenderness or lesions              Bartholins and Skenes: normal                 Vagina: normal appearing vagina with normal color and discharge, no lesions              Cervix: no lesions Bimanual Exam:  Uterus:  no masses or tenderness              Adnexa: no mass, fullness, tenderness              Rectovaginal: Deferred              Anus:  normal, no lesions  Patient informed chaperone available to be present for breast and pelvic exam. Patient has requested no chaperone to be present. Patient has been advised what will be completed during breast and pelvic exam.   Wet prep + clue cells (+ odor)  Assessment/Plan:  42 y.o. [id number] for annual exam.   Well female exam with routine gynecological exam - Education provided on SBEs, importance of preventative screenings, current guidelines, high calcium diet, regular exercise, and multivitamin daily. Labs with PCP.   Cervical cancer screening - Plan: Cytology - PAP( [facility]). Normal pap history.   Vulvar itching - Plan: WET PREP FOR TRICH, YEAST, CLUE. + clue cells  Bacterial vaginosis - Plan: metroNIDAZOLE (METROGEL) 0.75 % vaginal gel nightly x 5 nights.   Screening for breast cancer - Continue annual screenings. Normal breast exam today.   Return in about 1 year (around 09/10/2024) for Annual.    Olivia Mackie DNP, 3:15 PM  09/11/2023  

## 2023-09-14 ENCOUNTER — Telehealth: Payer: 59 | Admitting: Physician Assistant

## 2023-09-14 DIAGNOSIS — J020 Streptococcal pharyngitis: Secondary | ICD-10-CM | POA: Diagnosis not present

## 2023-09-14 LAB — CYTOLOGY - PAP
Comment: NEGATIVE
Diagnosis: NEGATIVE
High risk HPV: NEGATIVE

## 2023-09-14 MED ORDER — AMOXICILLIN 400 MG/5ML PO SUSR
800.0000 mg | Freq: Two times a day (BID) | ORAL | 0 refills | Status: DC
Start: 2023-09-14 — End: 2023-09-18

## 2023-09-14 NOTE — Progress Notes (Signed)

## 2023-09-18 ENCOUNTER — Encounter: Payer: Self-pay | Admitting: Nurse Practitioner

## 2023-09-18 ENCOUNTER — Other Ambulatory Visit (HOSPITAL_BASED_OUTPATIENT_CLINIC_OR_DEPARTMENT_OTHER): Payer: Self-pay

## 2023-09-18 ENCOUNTER — Ambulatory Visit (INDEPENDENT_AMBULATORY_CARE_PROVIDER_SITE_OTHER): Payer: 59 | Admitting: Family Medicine

## 2023-09-18 ENCOUNTER — Encounter (INDEPENDENT_AMBULATORY_CARE_PROVIDER_SITE_OTHER): Payer: Self-pay | Admitting: Family Medicine

## 2023-09-18 VITALS — BP 127/83 | HR 68 | Temp 97.7°F | Ht 64.0 in | Wt 181.0 lb

## 2023-09-18 DIAGNOSIS — Z6831 Body mass index (BMI) 31.0-31.9, adult: Secondary | ICD-10-CM

## 2023-09-18 DIAGNOSIS — D508 Other iron deficiency anemias: Secondary | ICD-10-CM

## 2023-09-18 DIAGNOSIS — D509 Iron deficiency anemia, unspecified: Secondary | ICD-10-CM | POA: Diagnosis not present

## 2023-09-18 DIAGNOSIS — E559 Vitamin D deficiency, unspecified: Secondary | ICD-10-CM | POA: Diagnosis not present

## 2023-09-18 MED ORDER — ZEPBOUND 7.5 MG/0.5ML ~~LOC~~ SOAJ
7.5000 mg | SUBCUTANEOUS | 0 refills | Status: DC
  Filled 2023-09-18 – 2023-10-13 (×4): qty 2, 28d supply, fill #0

## 2023-09-18 MED ORDER — VITAMIN D (ERGOCALCIFEROL) 1.25 MG (50000 UNIT) PO CAPS
50000.0000 [IU] | ORAL_CAPSULE | ORAL | 0 refills | Status: DC
  Filled 2023-09-18 – 2023-10-13 (×3): qty 4, 28d supply, fill #0

## 2023-09-18 MED ORDER — ZEPBOUND 7.5 MG/0.5ML ~~LOC~~ SOAJ
7.5000 mg | SUBCUTANEOUS | 0 refills | Status: DC
Start: 2023-09-18 — End: 2023-09-18

## 2023-09-18 NOTE — Assessment & Plan Note (Signed)
>>  ASSESSMENT AND PLAN FOR IRON DEFICIENCY ANEMIA WRITTEN ON 09/18/2023  2:50 PM BY Langston Reusing, MD  Patient on fusion plus daily.  She couldn't tolerate twice a day supplementation. Follow up labs in 2-3 months.  Last labs done at initial appointment.  Patient to continue current daily supplementation.

## 2023-09-18 NOTE — Assessment & Plan Note (Signed)
 Discussed importance of vitamin d supplementation.  Vitamin d supplementation has been shown to decrease fatigue, decrease risk of progression to insulin resistance and then prediabetes, decreases risk of falling in older age and can even assist in decreasing depressive symptoms in PTSD.   Prescription for Vitamin D sent in.

## 2023-09-18 NOTE — Assessment & Plan Note (Addendum)
Patient on fusion plus daily.  She couldn't tolerate twice a day supplementation. Follow up labs in 2-3 months.  Last labs done at initial appointment.  Patient to continue current daily supplementation.

## 2023-09-18 NOTE — Progress Notes (Signed)
   SUBJECTIVE:  Chief Complaint: Obesity  Interim History: Patient has had a good few weeks.  Her daughter had COVID recently. She has been walking and keeping up with the kids and going to cheer competitions. She is not going to beat herself up about her weight gain.  She will be going to her family's house for Thanksgiving.  She isn't feeling much restriction currently on 5mg  of Zepbound so is interested in increasing her dose.  Alenna is here to discuss her progress with her obesity treatment plan. She is on the Category 1 Plan and states she is following her eating plan approximately 90 % of the time. She states she is walking 30 minutes 7 times per week.   OBJECTIVE: Visit Diagnoses: Problem List Items Addressed This Visit       Other   Vitamin D deficiency    Discussed importance of vitamin d supplementation.  Vitamin d supplementation has been shown to decrease fatigue, decrease risk of progression to insulin resistance and then prediabetes, decreases risk of falling in older age and can even assist in decreasing depressive symptoms in PTSD.   Prescription for Vitamin D sent in.        Relevant Medications   Vitamin D, Ergocalciferol, (DRISDOL) 1.25 MG (50000 UNIT) CAPS capsule   Morbid obesity (HCC)   Relevant Medications   tirzepatide (ZEPBOUND) 7.5 MG/0.5ML Pen   Iron deficiency anemia - Primary    Patient on fusion plus daily.  She couldn't tolerate twice a day supplementation. Follow up labs in 2-3 months.  Last labs done at initial appointment.  Patient to continue current daily supplementation.      Other Visit Diagnoses     BMI 31.0-31.9,adult           Vitals Temp: 97.7 F (36.5 C) BP: 127/83 Pulse Rate: 68 SpO2: 100 %   Anthropometric Measurements Height: 5\' 4"  (1.626 m) Weight: 181 lb (82.1 kg) BMI (Calculated): 31.05 Weight at Last Visit: 179 lb Weight Lost Since Last Visit: 0 Weight Gained Since Last Visit: 2 Starting Weight: 273 lb Total  Weight Loss (lbs): 92 lb (41.7 kg)   Body Composition  Body Fat %: 40.4 % Fat Mass (lbs): 73.4 lbs Muscle Mass (lbs): 103 lbs Total Body Water (lbs): 81.4 lbs Visceral Fat Rating : 8   Other Clinical Data Today's Visit #: 23 Starting Date: 01/08/20     ASSESSMENT AND PLAN:  Diet: Felis is currently in the action stage of change. As such, her goal is to continue with weight loss efforts. She has agreed to Category 1 Plan.  Exercise: Krishonda has been instructed that some exercise is better than none for weight loss and overall health benefits.   Behavior Modification:  We discussed the following Behavioral Modification Strategies today: increasing lean protein intake, increasing vegetables, decreasing eating out, meal planning and cooking strategies, and keeping healthy foods in the home. We discussed various medication options to help [facility] with her weight loss efforts and we both agreed to increase Zepbound to 7.5mg .  No follow-ups on file.Marland Kitchen She was informed of the importance of frequent follow up visits to maximize her success with intensive lifestyle modifications for her multiple health conditions.  Attestation Statements:   Reviewed by clinician on day of visit: allergies, medications, problem list, medical history, surgical history, family history, social history, and previous encounter notes.   [name], MD

## 2023-09-20 ENCOUNTER — Encounter (INDEPENDENT_AMBULATORY_CARE_PROVIDER_SITE_OTHER): Payer: Self-pay

## 2023-09-21 ENCOUNTER — Other Ambulatory Visit (HOSPITAL_BASED_OUTPATIENT_CLINIC_OR_DEPARTMENT_OTHER): Payer: Self-pay

## 2023-09-25 ENCOUNTER — Other Ambulatory Visit (HOSPITAL_COMMUNITY): Payer: Self-pay

## 2023-09-29 ENCOUNTER — Telehealth: Payer: 59 | Admitting: Family Medicine

## 2023-09-29 ENCOUNTER — Other Ambulatory Visit (HOSPITAL_BASED_OUTPATIENT_CLINIC_OR_DEPARTMENT_OTHER): Payer: Self-pay

## 2023-09-29 DIAGNOSIS — R3989 Other symptoms and signs involving the genitourinary system: Secondary | ICD-10-CM

## 2023-09-29 MED ORDER — CEPHALEXIN 500 MG PO CAPS: 500.0000 mg | ORAL_CAPSULE | Freq: Two times a day (BID) | ORAL | 0 refills | Status: DC

## 2023-09-29 NOTE — Progress Notes (Signed)

## 2023-10-01 ENCOUNTER — Telehealth: Payer: 59 | Admitting: Physician Assistant

## 2023-10-01 ENCOUNTER — Other Ambulatory Visit (HOSPITAL_BASED_OUTPATIENT_CLINIC_OR_DEPARTMENT_OTHER): Payer: Self-pay

## 2023-10-01 DIAGNOSIS — J020 Streptococcal pharyngitis: Secondary | ICD-10-CM

## 2023-10-01 MED ORDER — CEPHALEXIN 500 MG PO CAPS: 500.0000 mg | ORAL_CAPSULE | Freq: Two times a day (BID) | ORAL | 0 refills | Status: DC

## 2023-10-01 MED ORDER — AMOXICILLIN 500 MG PO CAPS: 500.0000 mg | ORAL_CAPSULE | Freq: Two times a day (BID) | ORAL | 0 refills | Status: AC

## 2023-10-01 NOTE — Addendum Note (Signed)
Addended by: Christeen Douglas Z on: 10/01/2023 05:50 PM   Modules accepted: Orders

## 2023-10-01 NOTE — Progress Notes (Addendum)
E-Visit for Sore Throat - Strep Symptoms  We are sorry that you are not feeling well.  Here is how we plan to help!  Based on what you have shared with me it is likely that you have strep pharyngitis.  Strep pharyngitis is inflammation and infection in the back of the throat.  This is an infection cause by bacteria and is treated with antibiotics.   You are currently on Keflex which will also treat strep throat.  I will send in three more days of antibiotic so you can complete a 10 day course instead of 7 day course. If no improvement make an appointment with your primary care physician or go to Urgent Care for evaluation.    For throat pain, we recommend over the counter oral pain relief medications such as acetaminophen or aspirin, or anti-inflammatory medications such as ibuprofen or naproxen sodium. Topical treatments such as oral throat lozenges or sprays may be used as needed. Strep infections are not as easily transmitted as other respiratory infections, however we still recommend that you avoid close contact with loved ones, especially the very young and elderly.  Remember to wash your hands thoroughly throughout the day as this is the number one way to prevent the spread of infection and wipe down door knobs and counters with disinfectant.   Home Care: Only take medications as instructed by your medical team. Complete the entire course of an antibiotic. Do not take these medications with alcohol. A steam or ultrasonic humidifier can help congestion.  You can place a towel over your head and breathe in the steam from hot water coming from a faucet. Avoid close contacts especially the very young and the elderly. Cover your mouth when you cough or sneeze. Always remember to wash your hands.  Get Help Right Away If: You develop worsening fever or sinus pain. You develop a severe head ache or visual changes. Your symptoms persist after you have completed your treatment plan.  Make sure  you Understand these instructions. Will watch your condition. Will get help right away if you are not doing well or get worse.   Thank you for choosing an e-visit.  Your e-visit answers were reviewed by a board certified advanced clinical practitioner to complete your personal care plan. Depending upon the condition, your plan could have included both over the counter or prescription medications.  Please review your pharmacy choice. Make sure the pharmacy is open so you can pick up prescription now. If there is a problem, you may contact your provider through [employer] and have the prescription routed to another pharmacy.  Your safety is important to [place]. If you have drug allergies check your prescription carefully.   For the next 24 hours you can use MyChart to ask questions about today's visit, request a non-urgent call back, or ask for a work or school excuse. You will get an email in the next two days asking about your experience. I hope that your e-visit has been valuable and will speed your recovery.   I have spent 5 minutes in review of e-visit questionnaire, review and updating patient chart, medical decision making and response to patient.   Tylene Fantasia Ward, PA-C  Addendum: pt reports she never picked up keflex.  Will send in amoxicillin instead.

## 2023-10-01 NOTE — Telephone Encounter (Signed)
Call placed to patient to further discuss via telephone. No answer, mailbox full, unable to leave message.   MyChart message to patient.

## 2023-10-09 ENCOUNTER — Other Ambulatory Visit (HOSPITAL_BASED_OUTPATIENT_CLINIC_OR_DEPARTMENT_OTHER): Payer: Self-pay

## 2023-10-11 ENCOUNTER — Other Ambulatory Visit (HOSPITAL_BASED_OUTPATIENT_CLINIC_OR_DEPARTMENT_OTHER): Payer: Self-pay

## 2023-10-13 ENCOUNTER — Other Ambulatory Visit (HOSPITAL_BASED_OUTPATIENT_CLINIC_OR_DEPARTMENT_OTHER): Payer: Self-pay

## 2023-10-15 ENCOUNTER — Other Ambulatory Visit (HOSPITAL_BASED_OUTPATIENT_CLINIC_OR_DEPARTMENT_OTHER): Payer: Self-pay

## 2023-10-17 ENCOUNTER — Other Ambulatory Visit (HOSPITAL_BASED_OUTPATIENT_CLINIC_OR_DEPARTMENT_OTHER): Payer: Self-pay

## 2023-10-17 ENCOUNTER — Ambulatory Visit (INDEPENDENT_AMBULATORY_CARE_PROVIDER_SITE_OTHER): Payer: 59 | Admitting: Family Medicine

## 2023-10-17 ENCOUNTER — Encounter (INDEPENDENT_AMBULATORY_CARE_PROVIDER_SITE_OTHER): Payer: Self-pay | Admitting: Family Medicine

## 2023-10-17 VITALS — BP 109/77 | HR 74 | Ht 64.0 in | Wt 180.0 lb

## 2023-10-17 DIAGNOSIS — Z6830 Body mass index (BMI) 30.0-30.9, adult: Secondary | ICD-10-CM | POA: Diagnosis not present

## 2023-10-17 DIAGNOSIS — E559 Vitamin D deficiency, unspecified: Secondary | ICD-10-CM

## 2023-10-17 DIAGNOSIS — D508 Other iron deficiency anemias: Secondary | ICD-10-CM | POA: Diagnosis not present

## 2023-10-17 DIAGNOSIS — E66813 Obesity, class 3: Secondary | ICD-10-CM

## 2023-10-17 MED ORDER — FUSION PLUS PO CAPS
2.0000 | ORAL_CAPSULE | Freq: Every day | ORAL | 0 refills | Status: DC
  Filled 2023-10-17: qty 60, 30d supply, fill #0

## 2023-10-17 MED ORDER — VITAMIN D (ERGOCALCIFEROL) 1.25 MG (50000 UNIT) PO CAPS: 50000.0000 [IU] | ORAL_CAPSULE | ORAL | 0 refills | Status: DC

## 2023-10-17 MED ORDER — ZEPBOUND 7.5 MG/0.5ML ~~LOC~~ SOAJ
7.5000 mg | SUBCUTANEOUS | 0 refills | Status: DC
  Filled 2023-11-09 – 2023-11-10 (×2): qty 2, 28d supply, fill #0

## 2023-10-17 NOTE — Assessment & Plan Note (Signed)
Patient on RX Vitamin D. Last Vit D level of 31.   No nausea, vomiting or muscle weakness.  Needs refill today.

## 2023-10-17 NOTE — Assessment & Plan Note (Signed)
>>  ASSESSMENT AND PLAN FOR IRON DEFICIENCY ANEMIA WRITTEN ON 10/17/2023  4:21 PM BY Langston Reusing, MD  Patient doing well on fusion plus supplement.  She is taking 1 capsule a day.  Will repeat labs in February which will be 3 months since starting fusion plus.

## 2023-10-17 NOTE — Assessment & Plan Note (Signed)
Patient doing well on fusion plus supplement.  She is taking 1 capsule a day.  Will repeat labs in February which will be 3 months since starting fusion plus.

## 2023-10-17 NOTE — Progress Notes (Signed)
   SUBJECTIVE:  Chief Complaint: Obesity  Interim History: Patient has been nonstop since last appointment.  She thought she had an urinary tract infection and then had strep and then experienced GI issues.  She and her family stayed home for Thanksgiving.  Currently her kids are home with influenza.  She is planning to leave in 2 days for a cheer competition in [place].  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 She hasn't done as well as she could food wise but has been mindful and ate what she could tolerate during her illness.  Julie Mcneil is here to discuss her progress with her obesity treatment plan. She is on the Category 1 Plan and states she is following her eating plan approximately 50 % of the time. She states she is walking.   OBJECTIVE: Visit Diagnoses: Problem List Items Addressed This Visit       Other   Class 3 severe obesity with serious comorbidity and body mass index (BMI) of 45.0 to 49.9 in adult [facility])   Relevant Medications   tirzepatide (ZEPBOUND) 7.5 MG/0.5ML Pen   Vitamin D deficiency - Primary   Patient on RX Vitamin D. Last Vit D level of 31.   No nausea, vomiting or muscle weakness.  Needs refill today.      Relevant Medications   Vitamin D, Ergocalciferol, (DRISDOL) 1.25 MG (50000 UNIT) CAPS capsule   Morbid obesity (HCC)   Relevant Medications   tirzepatide (ZEPBOUND) 7.5 MG/0.5ML Pen   RESOLVED: Absolute anemia (Chronic)    >>ASSESSMENT AND PLAN FOR IRON DEFICIENCY ANEMIA WRITTEN ON 10/17/2023  4:21 PM BY Julie Reusing, MD  Patient doing well on fusion plus supplement.  She is taking 1 capsule a day.  Will repeat labs in February which will be 3 months since starting fusion plus.      Relevant Medications   Iron-FA-B Cmp-C-Biot-Probiotic (FUSION PLUS) CAPS   Other Visit Diagnoses       BMI 30.0-30.9,adult           No data recorded  No data recorded  No data recorded  No data recorded    ASSESSMENT AND  PLAN:  Diet: Julie Mcneil is currently in the action stage of change. As such, her goal is to continue with weight loss efforts. She has agreed to Category 1 Plan.  Exercise: Julie Mcneil has been instructed that some exercise is better than none for weight loss and overall health benefits.   Behavior Modification:  We discussed the following Behavioral Modification Strategies today: increasing lean protein intake, increasing vegetables, meal planning and cooking strategies, better snacking choices, travel eating strategies, and celebration eating strategies. We discussed various medication options to help Julie Mcneil with her weight loss efforts and we both agreed to continue current dose of Zepbound- re-evaluate dose at next appointment.  No follow-ups on file.Marland Kitchen She was informed of the importance of frequent follow up visits to maximize her success with intensive lifestyle modifications for her multiple health conditions.  Attestation Statements:   Reviewed by clinician on day of visit: allergies, medications, problem list, medical history, surgical history, family history, social history, and previous encounter notes.   Julie Likes, MD

## 2023-10-18 ENCOUNTER — Other Ambulatory Visit (HOSPITAL_BASED_OUTPATIENT_CLINIC_OR_DEPARTMENT_OTHER): Payer: Self-pay

## 2023-10-26 ENCOUNTER — Other Ambulatory Visit (HOSPITAL_BASED_OUTPATIENT_CLINIC_OR_DEPARTMENT_OTHER): Payer: Self-pay

## 2023-11-09 ENCOUNTER — Other Ambulatory Visit (HOSPITAL_BASED_OUTPATIENT_CLINIC_OR_DEPARTMENT_OTHER): Payer: Self-pay

## 2023-11-10 ENCOUNTER — Other Ambulatory Visit (HOSPITAL_BASED_OUTPATIENT_CLINIC_OR_DEPARTMENT_OTHER): Payer: Self-pay

## 2023-11-13 ENCOUNTER — Other Ambulatory Visit (HOSPITAL_BASED_OUTPATIENT_CLINIC_OR_DEPARTMENT_OTHER): Payer: Self-pay

## 2023-12-05 ENCOUNTER — Encounter (INDEPENDENT_AMBULATORY_CARE_PROVIDER_SITE_OTHER): Payer: Self-pay | Admitting: Family Medicine

## 2023-12-05 ENCOUNTER — Other Ambulatory Visit (HOSPITAL_BASED_OUTPATIENT_CLINIC_OR_DEPARTMENT_OTHER): Payer: Self-pay

## 2023-12-05 ENCOUNTER — Ambulatory Visit (INDEPENDENT_AMBULATORY_CARE_PROVIDER_SITE_OTHER): Payer: 59 | Admitting: Family Medicine

## 2023-12-05 ENCOUNTER — Telehealth: Payer: 59 | Admitting: Physician Assistant

## 2023-12-05 VITALS — BP 105/72 | HR 73 | Temp 98.2°F | Ht 64.0 in | Wt 171.0 lb

## 2023-12-05 DIAGNOSIS — J019 Acute sinusitis, unspecified: Secondary | ICD-10-CM

## 2023-12-05 DIAGNOSIS — E559 Vitamin D deficiency, unspecified: Secondary | ICD-10-CM

## 2023-12-05 DIAGNOSIS — Z6829 Body mass index (BMI) 29.0-29.9, adult: Secondary | ICD-10-CM

## 2023-12-05 DIAGNOSIS — B9689 Other specified bacterial agents as the cause of diseases classified elsewhere: Secondary | ICD-10-CM | POA: Diagnosis not present

## 2023-12-05 DIAGNOSIS — D509 Iron deficiency anemia, unspecified: Secondary | ICD-10-CM | POA: Diagnosis not present

## 2023-12-05 DIAGNOSIS — D508 Other iron deficiency anemias: Secondary | ICD-10-CM

## 2023-12-05 MED ORDER — VITAMIN D (ERGOCALCIFEROL) 1.25 MG (50000 UNIT) PO CAPS
50000.0000 [IU] | ORAL_CAPSULE | ORAL | 0 refills | Status: DC
  Filled 2023-12-05 – 2024-04-11 (×2): qty 4, 28d supply, fill #0

## 2023-12-05 MED ORDER — AZITHROMYCIN 250 MG PO TABS
ORAL_TABLET | ORAL | 0 refills | Status: DC
Start: 2023-12-05 — End: 2023-12-07

## 2023-12-05 MED ORDER — ZEPBOUND 7.5 MG/0.5ML ~~LOC~~ SOAJ
7.5000 mg | SUBCUTANEOUS | 0 refills | Status: DC
  Filled 2023-12-05 – 2024-01-01 (×2): qty 2, 28d supply, fill #0

## 2023-12-05 MED ORDER — FUSION PLUS PO CAPS
2.0000 | ORAL_CAPSULE | Freq: Every day | ORAL | 0 refills | Status: DC
  Filled 2023-12-05: qty 60, 30d supply, fill #0

## 2023-12-05 NOTE — Progress Notes (Signed)
SUBJECTIVE:  Chief Complaint: Obesity  Interim History: Patient has had influenza and another URI.  She has been to [place], [place] and going to [place] this weekend.  Over the course of this month she has walked .  She is doing a protein shake for breakfast, [place] sandwich at lunch and then a protein, vegetable and starch for supper. She is looking for easy fast options for her dinners the night they have cheer.   Gisella is here to discuss her progress with her obesity treatment plan. She is on the Category 1 Plan and states she is following her eating plan approximately 75 % of the time. She states she is walked 100 miles this past month.   OBJECTIVE: Visit Diagnoses: Problem List Items Addressed This Visit       Other   Vitamin D deficiency   Last vitamin D level of 31.4 in October 2024.  Patient needs a refill on her vitamin D prescription today.  She has been able to tolerate prescription strength vitamin D with no appreciable side effects.  She denies nausea, vomiting, and muscle weakness.  Refill sent into pharmacy today.      Relevant Medications   Vitamin D, Ergocalciferol, (DRISDOL) 1.25 MG (50000 UNIT) CAPS capsule   Morbid obesity (HCC)   Starting weight of 273 pounds on January 08, 2020.  She is currently at 171 pounds after bariatric surgery as well as treatment with Zepbound.  She is doing well on the 7.5 mg dose of Zepbound weekly.  She does not need an increase in her dose today.  She does however need a refill.      Relevant Medications   tirzepatide (ZEPBOUND) 7.5 MG/0.5ML Pen   Iron deficiency anemia - Primary   Patient is status post bariatric surgery.  She has iron deficiency when she does not take iron supplements.  She has been able to tolerate the fusion plus capsules much easier with no GI side effects as compared to when she is taking iron alone.  She needs a refill on the fusion plus today.      Relevant Medications   Iron-FA-B  Cmp-C-Biot-Probiotic (FUSION PLUS) CAPS   Other Visit Diagnoses       BMI 29.0-29.9,adult             12/05/2023    3:00 PM 10/17/2023    4:00 PM 09/18/2023    2:00 PM  Vitals with BMI  Height 5\' 4"  5\' 4"  5\' 4"   Weight 171 lbs 180 lbs 181 lbs  BMI 29.34 30.88 31.05  Systolic 105 109 161  Diastolic 72 77 83  Pulse 73 74 68    No data recorded  No data recorded  No data recorded  No data recorded    ASSESSMENT AND PLAN:  Diet: Geniyah is currently in the action stage of change. As such, her goal is to continue with weight loss efforts. She has agreed to Category 1 Plan.  Exercise: Fontaine has been instructed that some exercise is better than none for weight loss and overall health benefits.   Behavior Modification:  We discussed the following Behavioral Modification Strategies today: increasing lean protein intake, increasing vegetables, decreasing eating out, meal planning and cooking strategies, better snacking choices, and planning for success.   No follow-ups on file.Marland Kitchen She was informed of the importance of frequent follow up visits to maximize her success with intensive lifestyle modifications for her multiple health conditions.  Attestation Statements:   Reviewed by clinician  on day of visit: allergies, medications, problem list, medical history, surgical history, family history, social history, and previous encounter notes.    [name], MD

## 2023-12-05 NOTE — Progress Notes (Signed)
E-Visit for Sinus Problems  We are sorry that you are not feeling well.  Here is how we plan to help!  Based on what you have shared with me it looks like you have sinusitis.  Sinusitis is inflammation and infection in the sinus cavities of the head.  Based on your presentation I believe you most likely have Acute Bacterial Sinusitis.  This is an infection caused by bacteria and is treated with antibiotics. I have prescribed zithromax. You may use an oral decongestant such as Mucinex D or if you have glaucoma or high blood pressure use plain Mucinex. Saline nasal spray help and can safely be used as often as needed for congestion.  If you develop worsening sinus pain, fever or notice severe headache and vision changes, or if symptoms are not better after completion of antibiotic, please schedule an appointment with a health care provider.    Sinus infections are not as easily transmitted as other respiratory infection, however we still recommend that you avoid close contact with loved ones, especially the very young and elderly.  Remember to wash your hands thoroughly throughout the day as this is the number one way to prevent the spread of infection!  Home Care: Only take medications as instructed by your medical team. Complete the entire course of an antibiotic. Do not take these medications with alcohol. A steam or ultrasonic humidifier can help congestion.  You can place a towel over your head and breathe in the steam from hot water coming from a faucet. Avoid close contacts especially the very young and the elderly. Cover your mouth when you cough or sneeze. Always remember to wash your hands.  Get Help Right Away If: You develop worsening fever or sinus pain. You develop a severe head ache or visual changes. Your symptoms persist after you have completed your treatment plan.  Make sure you Understand these instructions. Will watch your condition. Will get help right away if you are not  doing well or get worse.  Thank you for choosing an e-visit.  Your e-visit answers were reviewed by a board certified advanced clinical practitioner to complete your personal care plan. Depending upon the condition, your plan could have included both over the counter or prescription medications.  Please review your pharmacy choice. Make sure the pharmacy is open so you can pick up prescription now. If there is a problem, you may contact your provider through [employer] and have the prescription routed to another pharmacy.  Your safety is important to [place]. If you have drug allergies check your prescription carefully.   For the next 24 hours you can use MyChart to ask questions about today's visit, request a non-urgent call back, or ask for a work or school excuse. You will get an email in the next two days asking about your experience. I hope that your e-visit has been valuable and will speed your recovery.    have provided 5 minutes of non face to face time during this encounter for chart review and documentation.

## 2023-12-07 MED ORDER — AMOXICILLIN-POT CLAVULANATE 400-57 MG/5ML PO SUSR: 800.0000 mg | Freq: Two times a day (BID) | ORAL | 0 refills | Status: AC

## 2023-12-07 MED ORDER — CEFDINIR 250 MG/5ML PO SUSR: 300.0000 mg | Freq: Two times a day (BID) | ORAL | 0 refills | Status: DC

## 2023-12-07 NOTE — Addendum Note (Signed)
Addended by: Margaretann Loveless on: 12/07/2023 01:12 PM   Modules accepted: Orders

## 2023-12-07 NOTE — Addendum Note (Signed)
Addended by: Georgana Curio on: 12/07/2023 12:00 PM   Modules accepted: Orders

## 2023-12-11 DIAGNOSIS — D509 Iron deficiency anemia, unspecified: Secondary | ICD-10-CM | POA: Insufficient documentation

## 2023-12-11 NOTE — Assessment & Plan Note (Signed)
 Last vitamin D  level of 31.4 in October 2024.  Patient needs a refill on her vitamin D  prescription today.  She has been able to tolerate prescription strength vitamin D  with no appreciable side effects.  She denies nausea, vomiting, and muscle weakness.  Refill sent into pharmacy today.

## 2023-12-11 NOTE — Assessment & Plan Note (Signed)
 Starting weight of 273 pounds on January 08, 2020.  She is currently at 171 pounds after bariatric surgery as well as treatment with Zepbound .  She is doing well on the 7.5 mg dose of Zepbound  weekly.  She does not need an increase in her dose today.  She does however need a refill.

## 2023-12-11 NOTE — Assessment & Plan Note (Signed)
 Patient is status post bariatric surgery.  She has iron  deficiency when she does not take iron  supplements.  She has been able to tolerate the fusion plus capsules much easier with no GI side effects as compared to when she is taking iron  alone.  She needs a refill on the fusion plus today.

## 2023-12-17 ENCOUNTER — Other Ambulatory Visit (HOSPITAL_BASED_OUTPATIENT_CLINIC_OR_DEPARTMENT_OTHER): Payer: Self-pay

## 2024-01-01 ENCOUNTER — Other Ambulatory Visit: Payer: Self-pay

## 2024-01-01 ENCOUNTER — Other Ambulatory Visit (HOSPITAL_BASED_OUTPATIENT_CLINIC_OR_DEPARTMENT_OTHER): Payer: Self-pay

## 2024-01-02 ENCOUNTER — Other Ambulatory Visit (HOSPITAL_BASED_OUTPATIENT_CLINIC_OR_DEPARTMENT_OTHER): Payer: Self-pay

## 2024-01-02 ENCOUNTER — Encounter (INDEPENDENT_AMBULATORY_CARE_PROVIDER_SITE_OTHER): Payer: Self-pay | Admitting: Family Medicine

## 2024-01-02 ENCOUNTER — Telehealth (INDEPENDENT_AMBULATORY_CARE_PROVIDER_SITE_OTHER): Payer: 59 | Admitting: Family Medicine

## 2024-01-02 DIAGNOSIS — E559 Vitamin D deficiency, unspecified: Secondary | ICD-10-CM

## 2024-01-02 DIAGNOSIS — D508 Other iron deficiency anemias: Secondary | ICD-10-CM | POA: Diagnosis not present

## 2024-01-02 MED ORDER — ZEPBOUND 10 MG/0.5ML ~~LOC~~ SOAJ
10.0000 mg | SUBCUTANEOUS | 0 refills | Status: DC
Start: 2024-01-02 — End: 2024-02-14
  Filled 2024-01-02: qty 2, 28d supply, fill #0

## 2024-01-02 MED ORDER — FUSION PLUS PO CAPS
2.0000 | ORAL_CAPSULE | Freq: Every day | ORAL | 0 refills | Status: AC
  Filled 2024-01-02: qty 60, 30d supply, fill #0
  Filled 2024-04-11 (×2): qty 180, 90d supply, fill #0

## 2024-01-02 MED ORDER — TIRZEPATIDE 10 MG/0.5ML ~~LOC~~ SOAJ
10.0000 mg | SUBCUTANEOUS | 0 refills | Status: DC
Start: 2024-01-02 — End: 2024-01-02
  Filled 2024-01-02: qty 2, 28d supply, fill #0

## 2024-01-02 NOTE — Assessment & Plan Note (Signed)
 Doing well on fusion plus with no GI side effects of the iron.  Needs a refil of the fusion plus prior to next appointment.  Will order labs at next appointment.

## 2024-01-02 NOTE — Progress Notes (Signed)
 TeleHealth Visit:   Julie Mcneil has verbally consented to this TeleHealth visit. The patient is located at home, the provider is located at the [facility] office. The participants in this visit include the listed provider and patient. The visit was conducted today via MyChart.   Chief Complaint: OBESITY Julie Mcneil is here to discuss her progress with her obesity treatment plan along with follow-up of her obesity related diagnoses. Julie Mcneil is on the Category 1 Plan and states she is following her eating plan approximately 80% of the time. Julie Mcneil states she is walking 1 mile 7 times per week.  No data recorded Anthropometric Measurements Height: 5\' 4"  (1.626 m) Weight at Last Visit: 171 lb   No data recorded Other Clinical Data Today's Visit #: 54 Starting Date: 01/08/20 Comments: Cat 1    Reported Weight:Her entire house has gotten sick with the stomach virus.  She is currently dealing with symptoms that started yesterday.  Life has been busy and she had been following her meal plan most of the time.  She has been having significant sweet cravings but has had her period and has had cake in the house for her daughter's birthday.  Thinks she could use an increase of her mounjaro.  Subjective:   There are no diagnoses linked to this encounter. Assessment/Plan:   Problem List Items Addressed This Visit       Other   Vitamin D deficiency   Doing well on prescription strength Vitamin d.  Will need repeat level ordered at next appointment.      Morbid obesity (HCC) - Primary   Starting weight of 273 pounds on January 08, 2020.  As this is a virtual visit there was no reported weight.  Noticing an increase and hunger and cravings and voices she could go up on her dosage of Zepbound- 10mg  dose sent in.       Relevant Medications   tirzepatide [facility]) 10 MG/0.5ML Pen   Iron deficiency anemia   Doing well on fusion plus with no GI side effects of the iron.   Needs a refil of the fusion plus prior to next appointment.  Will order labs at next appointment.      Relevant Medications   Iron-FA-B Cmp-C-Biot-Probiotic (FUSION PLUS) CAPS    There are no diagnoses linked to this encounter.  Julie Mcneil is currently in the action stage of change. As such, her goal is to continue with weight loss efforts and has agreed to the Category 1 Plan.   Exercise goals: For substantial health benefits, adults should do at least 150 minutes (2 hours and 30 minutes) a week of moderate-intensity, or 75 minutes (1 hour and 15 minutes) a week of vigorous-intensity aerobic physical activity, or an equivalent combination of moderate- and vigorous-intensity aerobic activity. Aerobic activity should be performed in episodes of at least 10 minutes, and preferably, it should be spread throughout the week.  Behavioral modification strategies: increasing lean protein intake, increasing vegetables, meal planning and cooking strategies, better snacking choices, and travel eating strategies.  [name] has agreed to follow-up with our clinic in 4 weeks. She was informed of the importance of frequent follow-up visits to maximize her success with intensive lifestyle modifications for her multiple health conditions.   Objective:   VITALS: Per patient if applicable, see vitals. GENERAL: Alert and in no acute distress. CARDIOPULMONARY: No increased WOB. Speaking in clear sentences.  PSYCH: Pleasant and cooperative. Speech normal rate and rhythm. Affect is appropriate. Insight and  judgement are appropriate. Attention is focused, linear, and appropriate.  NEURO: Oriented as arrived to appointment on time with no prompting.   Lab Results  Component Value Date   CREATININE 0.66 08/15/2023   BUN 7 08/15/2023   NA 142 08/15/2023   K 4.5 08/15/2023   CL 105 08/15/2023   CO2 23 08/15/2023   Lab Results  Component Value Date   ALT 8 08/15/2023   AST 12 08/15/2023   ALKPHOS 84  08/15/2023   BILITOT 1.1 08/15/2023   Lab Results  Component Value Date   HGBA1C 5.4 08/15/2023   HGBA1C 5.5 08/10/2021   HGBA1C 5.7 (H) 06/09/2020   HGBA1C 5.7 (H) 01/08/2020   HGBA1C 5.5 04/09/2017   Lab Results  Component Value Date   INSULIN 5.8 08/15/2023   INSULIN 17.0 06/09/2020   INSULIN 18.0 01/08/2020   Lab Results  Component Value Date   TSH 1.600 08/15/2023   Lab Results  Component Value Date   CHOL 170 08/15/2023   HDL 56 08/15/2023   LDLCALC 99 08/15/2023   TRIG 78 08/15/2023   CHOLHDL 3.7 01/08/2020   Lab Results  Component Value Date   VD25OH 31.4 08/15/2023   VD25OH 25.1 (L) 06/09/2020   VD25OH 25.0 (L) 01/08/2020   Lab Results  Component Value Date   WBC 5.8 08/15/2023   HGB 10.1 (L) 08/15/2023   HCT 34.4 08/15/2023   MCV 80 08/15/2023   PLT 379 08/15/2023   Lab Results  Component Value Date   IRON 28 08/15/2023   TIBC 331 08/15/2023   FERRITIN 6 (L) 08/15/2023    Attestation Statements:   Reviewed by clinician on day of visit: allergies, medications, problem list, medical history, surgical history, family history, social history, and previous encounter notes.    [name], MD

## 2024-01-02 NOTE — Assessment & Plan Note (Signed)
 Starting weight of 273 pounds on January 08, 2020.  As this is a virtual visit there was no reported weight.  Noticing an increase and hunger and cravings and voices she could go up on her dosage of Zepbound- 10mg  dose sent in.

## 2024-01-02 NOTE — Addendum Note (Signed)
 Addended by: Langston Reusing on: 01/02/2024 03:28 PM   Modules accepted: Orders

## 2024-01-02 NOTE — Assessment & Plan Note (Signed)
 Doing well on prescription strength Vitamin d.  Will need repeat level ordered at next appointment.

## 2024-01-05 ENCOUNTER — Other Ambulatory Visit (HOSPITAL_BASED_OUTPATIENT_CLINIC_OR_DEPARTMENT_OTHER): Payer: Self-pay

## 2024-01-21 ENCOUNTER — Encounter (INDEPENDENT_AMBULATORY_CARE_PROVIDER_SITE_OTHER): Payer: Self-pay | Admitting: Family Medicine

## 2024-01-22 NOTE — Telephone Encounter (Signed)
 Julie Mcneil's ([birth year]) missed new pt appts: 09/13/23, 10/10/23, 01/21/23  Please note documentation of all messages sent with 1 hour arrival prior to the appointment and address. Thanks

## 2024-01-30 ENCOUNTER — Ambulatory Visit (INDEPENDENT_AMBULATORY_CARE_PROVIDER_SITE_OTHER): Payer: 59 | Admitting: Family Medicine

## 2024-02-13 ENCOUNTER — Other Ambulatory Visit (INDEPENDENT_AMBULATORY_CARE_PROVIDER_SITE_OTHER): Payer: Self-pay | Admitting: Family Medicine

## 2024-02-14 ENCOUNTER — Other Ambulatory Visit (INDEPENDENT_AMBULATORY_CARE_PROVIDER_SITE_OTHER): Payer: Self-pay | Admitting: Family Medicine

## 2024-02-14 ENCOUNTER — Encounter (INDEPENDENT_AMBULATORY_CARE_PROVIDER_SITE_OTHER): Payer: Self-pay | Admitting: Family Medicine

## 2024-02-14 ENCOUNTER — Telehealth (INDEPENDENT_AMBULATORY_CARE_PROVIDER_SITE_OTHER): Admitting: Family Medicine

## 2024-02-14 ENCOUNTER — Other Ambulatory Visit (HOSPITAL_BASED_OUTPATIENT_CLINIC_OR_DEPARTMENT_OTHER): Payer: Self-pay

## 2024-02-14 ENCOUNTER — Encounter (HOSPITAL_BASED_OUTPATIENT_CLINIC_OR_DEPARTMENT_OTHER): Payer: Self-pay

## 2024-02-14 VITALS — Ht 64.0 in

## 2024-02-14 DIAGNOSIS — Z6829 Body mass index (BMI) 29.0-29.9, adult: Secondary | ICD-10-CM | POA: Diagnosis not present

## 2024-02-14 DIAGNOSIS — D508 Other iron deficiency anemias: Secondary | ICD-10-CM

## 2024-02-14 DIAGNOSIS — E785 Hyperlipidemia, unspecified: Secondary | ICD-10-CM | POA: Insufficient documentation

## 2024-02-14 DIAGNOSIS — E7849 Other hyperlipidemia: Secondary | ICD-10-CM

## 2024-02-14 MED ORDER — ZEPBOUND 10 MG/0.5ML ~~LOC~~ SOAJ
10.0000 mg | SUBCUTANEOUS | 0 refills | Status: DC
  Filled 2024-02-14 – 2024-02-27 (×2): qty 2, 28d supply, fill #0
  Filled 2024-03-17 – 2024-03-19 (×2): qty 2, 28d supply, fill #1
  Filled 2024-04-11 – 2024-05-06 (×2): qty 2, 28d supply, fill #2

## 2024-02-14 NOTE — Progress Notes (Signed)
 TeleHealth Visit:   Julie Mcneil has verbally consented to this TeleHealth visit. The patient is located at home, the provider is located at the [facility] office. The participants in this visit include the listed provider and patient . The visit was conducted today via MyChart.    Chief Complaint: OBESITY Julie Mcneil is here to discuss her progress with her obesity treatment plan along with follow-up of her obesity related diagnoses. Julie Mcneil is on the Category 1 Plan and states she is following her eating plan approximately 80% of the time. Julie Mcneil states she is walking 30 minutes 7 times per week.  She joined [employer] and wants to try to make it a point to go 3-4 days a week.  No data recorded No data recorded  No data recorded No data recorded   Reported Weight:161lb this am  Subjective:  Patient has been trying to meal prep and plan for the week and is making sure she follows it to limit other food intake.  She tries to plan out what she knows agrees with her food wise. She does feel like she has gotten all her food in daily as she is eating frequently throughout the day. There are no diagnoses linked to this encounter. Assessment/Plan:   Problem List Items Addressed This Visit       Other   Morbid obesity (HCC)   Doing well on zepbound and feels ready to go up to 10mg  for better hunger control.  3 month prescription sent in      Relevant Medications   tirzepatide (ZEPBOUND) 10 MG/0.5ML Pen   Iron deficiency anemia - Primary   Patient tolerating Fusion Plus MVI well with no associated GI side effects.  No refill needed today but will order labs to check iron and CBC at next appointment.      Hyperlipidemia   Prior elevated cholesterols.  Needs recent labs- will repeat fasting lipid panel at next appointment.  Patient aware to come fasting.        There are no diagnoses linked to this encounter.  Julie Mcneil is currently in the action stage  of change. As such, her goal is to continue with weight loss efforts and has agreed to the Category 1 Plan.   Exercise goals: For substantial health benefits, adults should do at least 150 minutes (2 hours and 30 minutes) a week of moderate-intensity, or 75 minutes (1 hour and 15 minutes) a week of vigorous-intensity aerobic physical activity, or an equivalent combination of moderate- and vigorous-intensity aerobic activity. Aerobic activity should be performed in episodes of at least 10 minutes, and preferably, it should be spread throughout the week.  Behavioral modification strategies: increasing lean protein intake, increasing vegetables, meal planning and cooking strategies, travel eating strategies, and planning for success.  Julie Mcneil has agreed to follow-up with our clinic in 6 weeks. She was informed of the importance of frequent follow-up visits to maximize her success with intensive lifestyle modifications for her multiple health conditions.   Objective:   VITALS: Per patient if applicable, see vitals. GENERAL: Alert and in no acute distress. CARDIOPULMONARY: No increased WOB. Speaking in clear sentences.  PSYCH: Pleasant and cooperative. Speech normal rate and rhythm. Affect is appropriate. Insight and judgement are appropriate. Attention is focused, linear, and appropriate.  NEURO: Oriented as arrived to appointment on time with no prompting.   Lab Results  Component Value Date   CREATININE 0.66 08/15/2023   BUN 7 08/15/2023   NA 142 08/15/2023  K 4.5 08/15/2023   CL 105 08/15/2023   CO2 23 08/15/2023   Lab Results  Component Value Date   ALT 8 08/15/2023   AST 12 08/15/2023   ALKPHOS 84 08/15/2023   BILITOT 1.1 08/15/2023   Lab Results  Component Value Date   HGBA1C 5.4 08/15/2023   HGBA1C 5.5 08/10/2021   HGBA1C 5.7 (H) 06/09/2020   HGBA1C 5.7 (H) 01/08/2020   HGBA1C 5.5 04/09/2017   Lab Results  Component Value Date   INSULIN 5.8 08/15/2023   INSULIN 17.0  06/09/2020   INSULIN 18.0 01/08/2020   Lab Results  Component Value Date   TSH 1.600 08/15/2023   Lab Results  Component Value Date   CHOL 170 08/15/2023   HDL 56 08/15/2023   LDLCALC 99 08/15/2023   TRIG 78 08/15/2023   CHOLHDL 3.7 01/08/2020   Lab Results  Component Value Date   VD25OH 31.4 08/15/2023   VD25OH 25.1 (L) 06/09/2020   VD25OH 25.0 (L) 01/08/2020   Lab Results  Component Value Date   WBC 5.8 08/15/2023   HGB 10.1 (L) 08/15/2023   HCT 34.4 08/15/2023   MCV 80 08/15/2023   PLT 379 08/15/2023   Lab Results  Component Value Date   IRON 28 08/15/2023   TIBC 331 08/15/2023   FERRITIN 6 (L) 08/15/2023    Attestation Statements:   Reviewed by clinician on day of visit: allergies, medications, problem list, medical history, surgical history, family history, social history, and previous encounter notes.    Donaciano Frizzle, MD

## 2024-02-19 NOTE — Assessment & Plan Note (Signed)
 Doing well on zepbound and feels ready to go up to 10mg  for better hunger control.  3 month prescription sent in

## 2024-02-19 NOTE — Assessment & Plan Note (Signed)
 Prior elevated cholesterols.  Needs recent labs- will repeat fasting lipid panel at next appointment.  Patient aware to come fasting.

## 2024-02-19 NOTE — Assessment & Plan Note (Signed)
 Patient tolerating Fusion Plus MVI well with no associated GI side effects.  No refill needed today but will order labs to check iron and CBC at next appointment.

## 2024-02-25 ENCOUNTER — Other Ambulatory Visit (HOSPITAL_BASED_OUTPATIENT_CLINIC_OR_DEPARTMENT_OTHER): Payer: Self-pay

## 2024-02-27 ENCOUNTER — Other Ambulatory Visit (HOSPITAL_BASED_OUTPATIENT_CLINIC_OR_DEPARTMENT_OTHER): Payer: Self-pay

## 2024-03-05 ENCOUNTER — Ambulatory Visit (INDEPENDENT_AMBULATORY_CARE_PROVIDER_SITE_OTHER): Admitting: Family Medicine

## 2024-03-12 ENCOUNTER — Ambulatory Visit (INDEPENDENT_AMBULATORY_CARE_PROVIDER_SITE_OTHER): Admitting: Family Medicine

## 2024-03-17 ENCOUNTER — Other Ambulatory Visit (HOSPITAL_BASED_OUTPATIENT_CLINIC_OR_DEPARTMENT_OTHER): Payer: Self-pay

## 2024-04-11 ENCOUNTER — Telehealth: Admitting: Physician Assistant

## 2024-04-11 ENCOUNTER — Other Ambulatory Visit (HOSPITAL_BASED_OUTPATIENT_CLINIC_OR_DEPARTMENT_OTHER): Payer: Self-pay

## 2024-04-11 DIAGNOSIS — R051 Acute cough: Secondary | ICD-10-CM

## 2024-04-11 DIAGNOSIS — R509 Fever, unspecified: Secondary | ICD-10-CM

## 2024-04-11 MED ORDER — LEVOCETIRIZINE DIHYDROCHLORIDE 5 MG PO TABS
5.0000 mg | ORAL_TABLET | Freq: Every evening | ORAL | 0 refills | Status: DC
  Filled 2024-04-11: qty 14, 14d supply, fill #0

## 2024-04-11 MED ORDER — BENZONATATE 200 MG PO CAPS
200.0000 mg | ORAL_CAPSULE | Freq: Two times a day (BID) | ORAL | 0 refills | Status: DC | PRN
  Filled 2024-04-11: qty 20, 10d supply, fill #0

## 2024-04-11 NOTE — Progress Notes (Signed)
 I have spent 5 minutes in review of e-visit questionnaire, review and updating patient chart, medical decision making and response to patient.   Laure Kidney, PA-C

## 2024-04-11 NOTE — Progress Notes (Signed)
 E-Visit for Cough   We are sorry that you are not feeling well.  Here is how we plan to help!  Based on your presentation I believe you most likely have A cough due to allergies.  I recommend that you start the an over-the counter-allergy medication such as Claritin 10 mg or Zyrtec 10 mg daily.  I have prescribed xyzal.   In addition you may use A prescription cough medication called Tessalon  Perles 100mg . You may take 1-2 capsules every 8 hours as needed for your cough.   From your responses in the eVisit questionnaire you describe inflammation in the upper respiratory tract which is causing a significant cough.  This is commonly called Bronchitis and has four common causes:   Allergies Viral Infections Acid Reflux Bacterial Infection Allergies, viruses and acid reflux are treated by controlling symptoms or eliminating the cause. An example might be a cough caused by taking certain blood pressure medications. You stop the cough by changing the medication. Another example might be a cough caused by acid reflux. Controlling the reflux helps control the cough.  USE OF BRONCHODILATOR ("RESCUE") INHALERS: There is a risk from using your bronchodilator too frequently.  The risk is that over-reliance on a medication which only relaxes the muscles surrounding the breathing tubes can reduce the effectiveness of medications prescribed to reduce swelling and congestion of the tubes themselves.  Although you feel brief relief from the bronchodilator inhaler, your asthma may actually be worsening with the tubes becoming more swollen and filled with mucus.  This can delay other crucial treatments, such as oral steroid medications. If you need to use a bronchodilator inhaler daily, several times per day, you should discuss this with your provider.  There are probably better treatments that could be used to keep your asthma under control.     HOME CARE Only take medications as instructed by your medical  team. Complete the entire course of an antibiotic. Drink plenty of fluids and get plenty of rest. Avoid close contacts especially the very young and the elderly Cover your mouth if you cough or cough into your sleeve. Always remember to wash your hands A steam or ultrasonic humidifier can help congestion.   GET HELP RIGHT AWAY IF: You develop worsening fever. You become short of breath You cough up blood. Your symptoms persist after you have completed your treatment plan MAKE SURE YOU  Understand these instructions. Will watch your condition. Will get help right away if you are not doing well or get worse.    Thank you for choosing an e-visit.  Your e-visit answers were reviewed by a board certified advanced clinical practitioner to complete your personal care plan. Depending upon the condition, your plan could have included both over the counter or prescription medications.  Please review your pharmacy choice. Make sure the pharmacy is open so you can pick up prescription now. If there is a problem, you may contact your provider through [employer] and have the prescription routed to another pharmacy.  Your safety is important to us . If you have drug allergies check your prescription carefully.   For the next 24 hours you can use MyChart to ask questions about today's visit, request a non-urgent call back, or ask for a work or school excuse. You will get an email in the next two days asking about your experience. I hope that your e-visit has been valuable and will speed your recovery.

## 2024-04-11 NOTE — Progress Notes (Signed)
   I feel your condition warrants further evaluation and I recommend that you be seen in a face-to-face visit.   NOTE: There will be NO CHARGE for this E-Visit   If you are having a true medical emergency, please call 911.     For an urgent face to face visit, [facility] has multiple urgent care centers for your convenience.  Click the link below for the full list of locations and hours, walk-in wait times, appointment scheduling options and driving directions:  Urgent Care - [place], [place], [place], [place], [place], [place]  [place]     Your MyChart E-visit questionnaire answers were reviewed by a board certified advanced clinical practitioner to complete your personal care plan based on your specific symptoms.    Thank you for using e-Visits.

## 2024-04-11 NOTE — Addendum Note (Signed)
 Addended byMarciana Settle on: 04/11/2024 01:02 PM   Modules accepted: Level of Service

## 2024-04-21 ENCOUNTER — Other Ambulatory Visit (HOSPITAL_BASED_OUTPATIENT_CLINIC_OR_DEPARTMENT_OTHER): Payer: Self-pay

## 2024-05-15 ENCOUNTER — Other Ambulatory Visit (HOSPITAL_BASED_OUTPATIENT_CLINIC_OR_DEPARTMENT_OTHER): Payer: Self-pay

## 2024-05-15 ENCOUNTER — Ambulatory Visit (INDEPENDENT_AMBULATORY_CARE_PROVIDER_SITE_OTHER): Admitting: Family Medicine

## 2024-05-15 VITALS — BP 113/73 | HR 66 | Temp 98.0°F | Ht 64.0 in | Wt 156.0 lb

## 2024-05-15 DIAGNOSIS — E559 Vitamin D deficiency, unspecified: Secondary | ICD-10-CM | POA: Diagnosis not present

## 2024-05-15 DIAGNOSIS — E7849 Other hyperlipidemia: Secondary | ICD-10-CM

## 2024-05-15 DIAGNOSIS — Z6826 Body mass index (BMI) 26.0-26.9, adult: Secondary | ICD-10-CM

## 2024-05-15 DIAGNOSIS — R7303 Prediabetes: Secondary | ICD-10-CM | POA: Diagnosis not present

## 2024-05-15 DIAGNOSIS — D508 Other iron deficiency anemias: Secondary | ICD-10-CM

## 2024-05-15 MED ORDER — VITAMIN D (ERGOCALCIFEROL) 1.25 MG (50000 UNIT) PO CAPS
50000.0000 [IU] | ORAL_CAPSULE | ORAL | 0 refills | Status: DC
  Filled 2024-05-15 – 2024-07-04 (×4): qty 4, 28d supply, fill #0

## 2024-05-15 MED ORDER — ZEPBOUND 10 MG/0.5ML ~~LOC~~ SOAJ
10.0000 mg | SUBCUTANEOUS | 0 refills | Status: DC
  Filled 2024-05-15 – 2024-05-27 (×2): qty 6, 84d supply, fill #0
  Filled 2024-05-31 – 2024-06-18 (×2): qty 2, 28d supply, fill #0
  Filled 2024-06-18: qty 6, 84d supply, fill #0
  Filled 2024-07-01: qty 2, 28d supply, fill #1
  Filled 2024-07-01: qty 2, 28d supply, fill #0
  Filled 2024-07-02 – 2024-07-22 (×2): qty 2, 28d supply, fill #1

## 2024-05-15 NOTE — Progress Notes (Signed)
 SUBJECTIVE:  Chief Complaint: Obesity  Interim History: Patient has been very busy over the summer with driving her kids around, working and volunteering at swim meets.  She is following meal plan fairly strictly. Breakfast is a protein shake, lunch is a grilled chicken wrap with low carb tortilla.  Dinner is often the same because it is easy.  No planned travel until September.    Julie Mcneil is here to discuss her progress with her obesity treatment plan. She is on the Category 1 Plan and states she is following her eating plan approximately 80 % of the time. She states she is walking 1 mile 7 times per week.   OBJECTIVE: Visit Diagnoses: Problem List Items Addressed This Visit       Other   Prediabetes   Relevant Orders   Comprehensive metabolic panel with GFR   Hemoglobin A1c   Insulin , random   Vitamin D  deficiency   Relevant Medications   Vitamin D , Ergocalciferol , (DRISDOL ) 1.25 MG (50000 UNIT) CAPS capsule   Other Relevant Orders   VITAMIN D  25 Hydroxy (Vit-D Deficiency, Fractures)   Morbid obesity (HCC)   Relevant Medications   tirzepatide  (ZEPBOUND ) 10 MG/0.5ML Pen   Iron  deficiency anemia - Primary   Relevant Orders   CBC w/Diff/Platelet   Anemia panel   Hyperlipidemia   Relevant Orders   Lipid Panel With LDL/HDL Ratio    Vitals Temp: 98 F (36.7 C) BP: 113/73 Pulse Rate: 66 SpO2: 100 %   Anthropometric Measurements Height: 5' 4 (1.626 m) Weight: 156 lb (70.8 kg) BMI (Calculated): 26.76 Weight at Last Visit: 171 lb Weight Lost Since Last Visit: 15 Weight Gained Since Last Visit: 0 Starting Weight: 273 lb Total Weight Loss (lbs): 117 lb (53.1 kg)   Body Composition  Body Fat %: 35.8 % Fat Mass (lbs): 55.8 lbs Muscle Mass (lbs): 95.2 lbs Total Body Water  (lbs): 76.6 lbs Visceral Fat Rating : 6   Other Clinical Data Fasting: yes Labs: no Today's Visit #: 28 Starting Date: 01/08/20 Comments: Cat 1     ASSESSMENT AND PLAN: Assessment  & Plan Other iron  deficiency anemia Last iron  level normal but ferritin low and iron  saturation low.  She is taking fusion plus and needs CBC and anemia panel today. Other hyperlipidemia LDL elevated previously before her bariatric surgery.  Repeat FLP ordered today.  Will risk stratify when results in. Vitamin D  deficiency On prescription strength Vitamin D ,  Last Vitamin D  level October 2024 of 31.4.  Needs repeat Vitamin D  level today. Prediabetes Patient doing well on tirzepatide .  Need to repeat CMP, A1c and Insulin  today. Will discuss lab results at next appointment.  Morbid obesity (HCC) Doing well on tirzepatide  with no GI side effects mentioned.  Needs refill of tirzepatide  today.    Diet: Dorianna is currently in the action stage of change. As such, her goal is to continue with weight loss efforts and has agreed to the Category 1 Plan.   Exercise:  For substantial health benefits, adults should do at least 150 minutes (2 hours and 30 minutes) a week of moderate-intensity, or 75 minutes (1 hour and 15 minutes) a week of vigorous-intensity aerobic physical activity, or an equivalent combination of moderate- and vigorous-intensity aerobic activity. Aerobic activity should be performed in episodes of at least 10 minutes, and preferably, it should be spread throughout the week.  Behavior Modification:  We discussed the following Behavioral Modification Strategies today: increasing lean protein intake, decreasing simple carbohydrates, meal  planning and cooking strategies, and planning for success. We discussed various medication options to help [facility] with her weight loss efforts and we both agreed to continue Zepbound  at current doses.  Return in about 10 weeks (around 07/24/2024).   She was informed of the importance of frequent follow up visits to maximize her success with intensive lifestyle modifications for her multiple health conditions.  Attestation Statements:   Reviewed  by clinician on day of visit: allergies, medications, problem list, medical history, surgical history, family history, social history, and previous encounter notes.     Adelita Cho, MD

## 2024-05-21 NOTE — Assessment & Plan Note (Signed)
 Doing well on tirzepatide  with no GI side effects mentioned.  Needs refill of tirzepatide  today.

## 2024-05-21 NOTE — Assessment & Plan Note (Signed)
 LDL elevated previously before her bariatric surgery.  Repeat FLP ordered today.  Will risk stratify when results in.

## 2024-05-21 NOTE — Assessment & Plan Note (Signed)
 On prescription strength Vitamin D ,  Last Vitamin D  level October 2024 of 31.4.  Needs repeat Vitamin D  level today.

## 2024-05-21 NOTE — Assessment & Plan Note (Signed)
 Last iron  level normal but ferritin low and iron  saturation low.  She is taking fusion plus and needs CBC and anemia panel today.

## 2024-05-21 NOTE — Assessment & Plan Note (Signed)
 Patient doing well on tirzepatide .  Need to repeat CMP, A1c and Insulin  today. Will discuss lab results at next appointment.

## 2024-05-26 ENCOUNTER — Other Ambulatory Visit (HOSPITAL_BASED_OUTPATIENT_CLINIC_OR_DEPARTMENT_OTHER): Payer: Self-pay

## 2024-05-27 ENCOUNTER — Other Ambulatory Visit (HOSPITAL_BASED_OUTPATIENT_CLINIC_OR_DEPARTMENT_OTHER): Payer: Self-pay

## 2024-05-27 ENCOUNTER — Other Ambulatory Visit: Payer: Self-pay

## 2024-05-28 ENCOUNTER — Encounter: Payer: Self-pay | Admitting: Nurse Practitioner

## 2024-06-01 ENCOUNTER — Telehealth: Admitting: Physician Assistant

## 2024-06-01 DIAGNOSIS — R3989 Other symptoms and signs involving the genitourinary system: Secondary | ICD-10-CM

## 2024-06-01 MED ORDER — CEPHALEXIN 500 MG PO CAPS: 500.0000 mg | ORAL_CAPSULE | Freq: Two times a day (BID) | ORAL | 0 refills | Status: AC

## 2024-06-01 NOTE — Progress Notes (Signed)
 I have spent 5 minutes in review of e-visit questionnaire, review and updating patient chart, medical decision making and response to patient.   Laure Kidney, PA-C

## 2024-06-01 NOTE — Progress Notes (Signed)

## 2024-06-02 ENCOUNTER — Other Ambulatory Visit (HOSPITAL_BASED_OUTPATIENT_CLINIC_OR_DEPARTMENT_OTHER): Payer: Self-pay

## 2024-06-05 ENCOUNTER — Ambulatory Visit: Admitting: Radiology

## 2024-06-12 ENCOUNTER — Other Ambulatory Visit (HOSPITAL_BASED_OUTPATIENT_CLINIC_OR_DEPARTMENT_OTHER): Payer: Self-pay

## 2024-06-19 ENCOUNTER — Other Ambulatory Visit (HOSPITAL_COMMUNITY): Payer: Self-pay

## 2024-06-30 ENCOUNTER — Other Ambulatory Visit (HOSPITAL_COMMUNITY): Payer: Self-pay

## 2024-07-01 ENCOUNTER — Other Ambulatory Visit (HOSPITAL_COMMUNITY): Payer: Self-pay

## 2024-07-02 ENCOUNTER — Other Ambulatory Visit: Payer: Self-pay

## 2024-07-02 ENCOUNTER — Other Ambulatory Visit (HOSPITAL_COMMUNITY): Payer: Self-pay

## 2024-07-04 ENCOUNTER — Other Ambulatory Visit: Payer: Self-pay

## 2024-07-04 ENCOUNTER — Other Ambulatory Visit (HOSPITAL_COMMUNITY): Payer: Self-pay

## 2024-07-04 ENCOUNTER — Telehealth: Admitting: Physician Assistant

## 2024-07-04 DIAGNOSIS — L304 Erythema intertrigo: Secondary | ICD-10-CM | POA: Diagnosis not present

## 2024-07-04 MED ORDER — NYSTATIN 100000 UNIT/GM EX CREA: 1.0000 | TOPICAL_CREAM | Freq: Two times a day (BID) | CUTANEOUS | 0 refills | Status: AC

## 2024-07-04 NOTE — Progress Notes (Signed)
E Visit for Rash  We are sorry that you are not feeling well. Here is how we plan to help!  Based upon your presentation it appears you have a fungal infection.  I have prescribed: and Nystatin cream apply to the affected area twice daily   HOME CARE:  Take cool showers and avoid direct sunlight. Apply cool compress or wet dressings. Take a bath in an oatmeal bath.  Sprinkle content of one Aveeno packet under running faucet with comfortably warm water.  Bathe for 15-20 minutes, 1-2 times daily.  Pat dry with a towel. Do not rub the rash. Use hydrocortisone cream. Take an antihistamine like Benadryl for widespread rashes that itch.  The adult dose of Benadryl is 25-50 mg by mouth 4 times daily. Caution:  This type of medication may cause sleepiness.  Do not drink alcohol, drive, or operate dangerous machinery while taking antihistamines.  Do not take these medications if you have prostate enlargement.  Read package instructions thoroughly on all medications that you take.  GET HELP RIGHT AWAY IF:  Symptoms don't go away after treatment. Severe itching that persists. If you rash spreads or swells. If you rash begins to smell. If it blisters and opens or develops a yellow-brown crust. You develop a fever. You have a sore throat. You become short of breath.  MAKE SURE YOU:  Understand these instructions. Will watch your condition. Will get help right away if you are not doing well or get worse.  Thank you for choosing an e-visit.  Your e-visit answers were reviewed by a board certified advanced clinical practitioner to complete your personal care plan. Depending upon the condition, your plan could have included both over the counter or prescription medications.  Please review your pharmacy choice. Make sure the pharmacy is open so you can pick up prescription now. If there is a problem, you may contact your provider through MyChart messaging and have the prescription routed to  another pharmacy.  Your safety is important to us. If you have drug allergies check your prescription carefully.   For the next 24 hours you can use MyChart to ask questions about today's visit, request a non-urgent call back, or ask for a work or school excuse. You will get an email in the next two days asking about your experience. I hope that your e-visit has been valuable and will speed your recovery.  I have spent 5 minutes in review of e-visit questionnaire, review and updating patient chart, medical decision making and response to patient.   Sherrol Vicars M Tamarick Kovalcik, PA-C  

## 2024-07-13 ENCOUNTER — Other Ambulatory Visit (HOSPITAL_COMMUNITY): Payer: Self-pay

## 2024-07-24 ENCOUNTER — Telehealth (INDEPENDENT_AMBULATORY_CARE_PROVIDER_SITE_OTHER): Admitting: Family Medicine

## 2024-07-24 ENCOUNTER — Other Ambulatory Visit (HOSPITAL_BASED_OUTPATIENT_CLINIC_OR_DEPARTMENT_OTHER): Payer: Self-pay

## 2024-07-24 ENCOUNTER — Other Ambulatory Visit (HOSPITAL_COMMUNITY): Payer: Self-pay

## 2024-07-24 VITALS — Ht 64.0 in

## 2024-07-24 DIAGNOSIS — R7303 Prediabetes: Secondary | ICD-10-CM

## 2024-07-24 DIAGNOSIS — Z6826 Body mass index (BMI) 26.0-26.9, adult: Secondary | ICD-10-CM

## 2024-07-24 DIAGNOSIS — E559 Vitamin D deficiency, unspecified: Secondary | ICD-10-CM

## 2024-07-24 DIAGNOSIS — D508 Other iron deficiency anemias: Secondary | ICD-10-CM

## 2024-07-24 DIAGNOSIS — E7849 Other hyperlipidemia: Secondary | ICD-10-CM

## 2024-07-24 MED ORDER — ZEPBOUND 10 MG/0.5ML ~~LOC~~ SOAJ
10.0000 mg | SUBCUTANEOUS | 0 refills | Status: DC
  Filled 2024-07-24 – 2024-07-31 (×2): qty 6, 84d supply, fill #0
  Filled 2024-08-01 (×2): qty 2, 28d supply, fill #0
  Filled 2024-08-12 – 2024-09-11 (×4): qty 2, 28d supply, fill #1
  Filled 2024-09-18 – 2024-10-11 (×2): qty 2, 28d supply, fill #2

## 2024-07-24 NOTE — Progress Notes (Signed)
 TeleHealth Visit:   Julie Mcneil has verbally consented to this TeleHealth visit. The patient is located at home, the provider is located at the [facility] office. The participants in this visit include the listed provider and patient. The visit was conducted today via MyChart.   Chief Complaint: OBESITY Julie Mcneil is here to discuss her progress with her obesity treatment plan along with follow-up of her obesity related diagnoses. Julie Mcneil is on the Category 1 Plan and states she is following her eating plan approximately 90% of the time. Julie Mcneil states she is walking 1-2 miles 7 times per week.  No data recorded Anthropometric Measurements Height: 5' 4 (1.626 m) Weight at Last Visit: 156 lb Starting Weight: 273 lb   No data recorded Other Clinical Data Today's Visit #: 9 Starting Date: 01/18/20 Comments: Cat 1- virtual visit      Subjective:  Patient had one daughter starting college and normal work schedule has made life busy.  She has been trying to keep up with schedules and normal. Doing more mindful eating and watching her intake.  She does feel like she is getting enough in. She is walking 1-2 miles almost daily.  She is hoping to incorporate resistance training in the next few weeks.  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 There are no diagnoses linked to this encounter. Assessment/Plan:   Assessment & Plan Other iron  deficiency anemia Diagnosed after bariatric surgery.  CBC and Anemia panel ordered today for patient to get done at her earliest convenience.  Other hyperlipidemia Fasting lipid panel ordered today.  Patient has been working on lifestyle changes and using pharmacotherapy to help manage food intake and cholesterols.  Fasting lipid panel ordered today. Vitamin D  deficiency Patient previously on prescription strength Vitamin D  with no nausea, vomiting or muscle weakness.  Needs  repeat Vitamin D  level today. Morbid obesity (HCC)  BMI 26.0-26.9,adult  Prediabetes On tirzepatide  and making dietary modifications to manage and control blood sugars.  CMP, A1c and Insulin  level ordered today.  Patient to get labs done prior to next appointment and will discuss results at next appointment.    Julie Mcneil is currently in the action stage of change. As such, her goal is to continue with weight loss efforts and has agreed to the Category 1 Plan.   Exercise goals: For substantial health benefits, adults should do at least 150 minutes (2 hours and 30 minutes) a week of moderate-intensity, or 75 minutes (1 hour and 15 minutes) a week of vigorous-intensity aerobic physical activity, or an equivalent combination of moderate- and vigorous-intensity aerobic activity. Aerobic activity should be performed in episodes of at least 10 minutes, and preferably, it should be spread throughout the week.  Behavioral modification strategies: increasing lean protein intake, decreasing simple carbohydrates, increasing vegetables, meal planning and cooking strategies, and planning for success.  Julie Mcneil has agreed to follow-up with our clinic in 7 weeks. She was informed of the importance of frequent follow-up visits to maximize her success with intensive lifestyle modifications for her multiple health conditions.   Objective:   VITALS: Per patient if applicable, see vitals. GENERAL: Alert and in no acute distress. CARDIOPULMONARY: No increased WOB. Speaking in clear sentences.  PSYCH: Pleasant and cooperative. Speech normal rate and rhythm. Affect is appropriate. Insight and judgement are appropriate. Attention is focused, linear, and appropriate.  NEURO: Oriented as arrived to appointment on time with no prompting.   Lab Results  Component Value Date  CREATININE 0.66 08/15/2023   BUN 7 08/15/2023   NA 142 08/15/2023   K 4.5 08/15/2023   CL 105 08/15/2023   CO2 23 08/15/2023   Lab  Results  Component Value Date   ALT 8 08/15/2023   AST 12 08/15/2023   ALKPHOS 84 08/15/2023   BILITOT 1.1 08/15/2023   Lab Results  Component Value Date   HGBA1C 5.4 08/15/2023   HGBA1C 5.5 08/10/2021   HGBA1C 5.7 (H) 06/09/2020   HGBA1C 5.7 (H) 01/08/2020   HGBA1C 5.5 04/09/2017   Lab Results  Component Value Date   INSULIN  5.8 08/15/2023   INSULIN  17.0 06/09/2020   INSULIN  18.0 01/08/2020   Lab Results  Component Value Date   TSH 1.600 08/15/2023   Lab Results  Component Value Date   CHOL 170 08/15/2023   HDL 56 08/15/2023   LDLCALC 99 08/15/2023   TRIG 78 08/15/2023   CHOLHDL 3.7 01/08/2020   Lab Results  Component Value Date   VD25OH 31.4 08/15/2023   VD25OH 25.1 (L) 06/09/2020   VD25OH 25.0 (L) 01/08/2020   Lab Results  Component Value Date   WBC 5.8 08/15/2023   HGB 10.1 (L) 08/15/2023   HCT 34.4 08/15/2023   MCV 80 08/15/2023   PLT 379 08/15/2023   Lab Results  Component Value Date   IRON  28 08/15/2023   TIBC 331 08/15/2023   FERRITIN 6 (L) 08/15/2023    Attestation Statements:   Reviewed by clinician on day of visit: allergies, medications, problem list, medical history, surgical history, family history, social history, and previous encounter notes.    Adelita Cho, MD

## 2024-07-26 ENCOUNTER — Other Ambulatory Visit (HOSPITAL_COMMUNITY): Payer: Self-pay

## 2024-07-31 ENCOUNTER — Other Ambulatory Visit (HOSPITAL_COMMUNITY): Payer: Self-pay

## 2024-07-31 NOTE — Assessment & Plan Note (Signed)
 Diagnosed after bariatric surgery.  CBC and Anemia panel ordered today for patient to get done at her earliest convenience.

## 2024-07-31 NOTE — Assessment & Plan Note (Signed)
 Patient previously on prescription strength Vitamin D  with no nausea, vomiting or muscle weakness.  Needs repeat Vitamin D  level today.

## 2024-07-31 NOTE — Assessment & Plan Note (Signed)
 Fasting lipid panel ordered today.  Patient has been working on lifestyle changes and using pharmacotherapy to help manage food intake and cholesterols.  Fasting lipid panel ordered today.

## 2024-07-31 NOTE — Assessment & Plan Note (Signed)
 On tirzepatide  and making dietary modifications to manage and control blood sugars.  CMP, A1c and Insulin  level ordered today.  Patient to get labs done prior to next appointment and will discuss results at next appointment.

## 2024-08-01 ENCOUNTER — Other Ambulatory Visit (HOSPITAL_COMMUNITY): Payer: Self-pay

## 2024-08-06 ENCOUNTER — Telehealth: Admitting: Physician Assistant

## 2024-08-06 DIAGNOSIS — B9689 Other specified bacterial agents as the cause of diseases classified elsewhere: Secondary | ICD-10-CM

## 2024-08-06 DIAGNOSIS — J019 Acute sinusitis, unspecified: Secondary | ICD-10-CM | POA: Diagnosis not present

## 2024-08-06 MED ORDER — DOXYCYCLINE HYCLATE 100 MG PO TABS: 100.0000 mg | ORAL_TABLET | Freq: Two times a day (BID) | ORAL | 0 refills | Status: DC

## 2024-08-06 NOTE — Progress Notes (Signed)

## 2024-08-12 ENCOUNTER — Other Ambulatory Visit (HOSPITAL_COMMUNITY): Payer: Self-pay

## 2024-08-20 ENCOUNTER — Other Ambulatory Visit (HOSPITAL_COMMUNITY): Payer: Self-pay

## 2024-08-20 ENCOUNTER — Other Ambulatory Visit: Payer: Self-pay

## 2024-08-26 ENCOUNTER — Emergency Department (HOSPITAL_BASED_OUTPATIENT_CLINIC_OR_DEPARTMENT_OTHER): Admission: EM | Admit: 2024-08-26 | Discharge: 2024-08-26 | Disposition: A | Discharge status: Home / Self Care

## 2024-08-26 ENCOUNTER — Emergency Department (HOSPITAL_BASED_OUTPATIENT_CLINIC_OR_DEPARTMENT_OTHER): Admitting: Radiology

## 2024-08-26 ENCOUNTER — Other Ambulatory Visit: Payer: Self-pay

## 2024-08-26 DIAGNOSIS — R079 Chest pain, unspecified: Secondary | ICD-10-CM | POA: Diagnosis present

## 2024-08-26 DIAGNOSIS — D509 Iron deficiency anemia, unspecified: Secondary | ICD-10-CM | POA: Insufficient documentation

## 2024-08-26 LAB — URINALYSIS, ROUTINE W REFLEX MICROSCOPIC
Bacteria, UA: NONE SEEN
Bilirubin Urine: NEGATIVE
Glucose, UA: NEGATIVE mg/dL
Ketones, ur: NEGATIVE mg/dL
Leukocytes,Ua: NEGATIVE
Nitrite: NEGATIVE
Protein, ur: NEGATIVE mg/dL
Specific Gravity, Urine: 1.011 (ref 1.005–1.030)
pH: 7.5 (ref 5.0–8.0)

## 2024-08-26 LAB — COMPREHENSIVE METABOLIC PANEL WITH GFR
ALT: 9 U/L (ref 0–44)
AST: 16 U/L (ref 15–41)
Albumin: 4.3 g/dL (ref 3.5–5.0)
Alkaline Phosphatase: 82 U/L (ref 38–126)
Anion gap: 11 (ref 5–15)
BUN: 9 mg/dL (ref 6–20)
CO2: 24 mmol/L (ref 22–32)
Calcium: 9.5 mg/dL (ref 8.9–10.3)
Chloride: 105 mmol/L (ref 98–111)
Creatinine, Ser: 0.83 mg/dL (ref 0.44–1.00)
GFR, Estimated: >60 mL/min (ref >60)
Glucose, Bld: 92 mg/dL (ref 70–99)
Potassium: 4.4 mmol/L (ref 3.5–5.1)
Sodium: 140 mmol/L (ref 135–145)
Total Bilirubin: 1 mg/dL (ref 0.0–1.2)
Total Protein: 7.4 g/dL (ref 6.5–8.1)

## 2024-08-26 LAB — CBC
HCT: 28.6 % — ABNORMAL LOW (ref 36.0–46.0)
Hemoglobin: 8.7 g/dL — ABNORMAL LOW (ref 12.0–15.0)
MCH: 23.6 pg — ABNORMAL LOW (ref 26.0–34.0)
MCHC: 30.4 g/dL (ref 30.0–36.0)
MCV: 77.7 fL — ABNORMAL LOW (ref 80.0–100.0)
Platelets: 460 K/uL — ABNORMAL HIGH (ref 150–400)
RBC: 3.68 MIL/uL — ABNORMAL LOW (ref 3.87–5.11)
RDW: 14.4 % (ref 11.5–15.5)
WBC: 5.1 K/uL (ref 4.0–10.5)
nRBC: 0 % (ref 0.0–0.2)

## 2024-08-26 LAB — PREGNANCY, URINE: Preg Test, Ur: NEGATIVE

## 2024-08-26 LAB — TROPONIN T, HIGH SENSITIVITY: Troponin T High Sensitivity: <15 ng/L (ref 0–19)

## 2024-08-26 LAB — CBG MONITORING, ED: Glucose-Capillary: 75 mg/dL (ref 70–99)

## 2024-08-26 MED ORDER — SODIUM CHLORIDE 0.9 % IV BOLUS
1000.0000 mL | Freq: Once | INTRAVENOUS | Status: AC
  Administered 2024-08-26: 1000 mL via INTRAVENOUS

## 2024-08-26 NOTE — Discharge Instructions (Signed)
 Restart your iron  supplement, you may find 325 mg ferrous sulfate  over-the-counter, take this every other day and with a stool softener/increase your fluid intake to avoid constipation.  You may also add MiraLAX as a part of this regimen if you do become constipated.  Discuss your iron  deficiency anemia with your provider at your follow-up appointment next week.  Return to the emergency department if your symptoms worsen.

## 2024-08-26 NOTE — ED Triage Notes (Addendum)
 Woke up yesterday with a HA. Later in the day developed CP., SOB when standing. -N/-V/-D. Near syncope when standing too long. Just finished heavy menstrual cycle- anemic-has not been compliant with Fe+ supplement.

## 2024-08-26 NOTE — ED Provider Notes (Signed)
 [facility] EMERGENCY DEPARTMENT AT [facility] Provider Note   CSN: [id number] Arrival date & time: 08/26/24  1609     Patient presents with: Chest Pain   Julie Mcneil is a 43 y.o. female.   43 year old female presenting with multiple complaints.  Patient notes last night that she began to develop a mild dull headache, she did not think much of it but when she woke up this morning her headache persisted, she took ibuprofen  with minimal relief of her symptoms.  She states I think my anxiety got the best to me, she began to feel anxious about her symptoms and then developed chest tightness today, this is since resolved.  Patient notes that she when she was up and about today she felt more fatigued/rundown than usual, denies syncope.  History of iron  deficiency anemia, she is wondering if her symptoms today may be due to baseline IDA, she admits that she has not been taking her iron  supplements due to severe constipation.  She also recently came off her menstrual cycle, which she notes was heavier for her.  She is followed for IDA by her weight loss specialist, who she follows up with next week.  She is also on Zepbound  with her last dose being Sunday night, denies nausea/vomiting/diarrhea.  Denies vision changes, melena/hematochezia.   Chest Pain      Prior to Admission medications   Medication Sig Start Date End Date Taking? Authorizing Provider  doxycycline  (VIBRA -TABS) 100 MG tablet Take 1 tablet (100 mg total) by mouth 2 (two) times daily. 08/06/24   Gladis Elsie BROCKS, PA-C  Iron -FA-B Cmp-C-Biot-Probiotic (FUSION PLUS) CAPS Take 2 capsules by mouth daily. 01/02/24   [place] Adelita PENNER, MD  Multiple Vitamin (MULTIVITAMIN ADULT PO) Take by mouth.    [provider]  nystatin  cream (MYCOSTATIN ) Apply 1 Application topically 2 (two) times daily. 07/04/24   Vivienne Delon HERO, PA-C  pantoprazole  (PROTONIX ) 40 MG tablet Take 1 tablet (40 mg total) by mouth daily.  06/28/23     tirzepatide  (ZEPBOUND ) 10 MG/0.5ML Pen Inject 10 mg into the skin once a week. 07/24/24   [place] Adelita PENNER, MD  Vitamin D , Ergocalciferol , (DRISDOL ) 1.25 MG (50000 UNIT) CAPS capsule Take 1 capsule (50,000 Units total) by mouth every 7 (seven) days. 05/15/24   [place] Adelita PENNER, MD    Allergies: Sumatriptan , Azithromycin , Levaquin  [levofloxacin  in d5w], Nitrofurantoin monohyd macro, and Cefdinir     Review of Systems  Cardiovascular:  Positive for chest pain.    Updated Vital Signs  Vitals:   08/26/24 1616 08/26/24 1945  BP: (!) 145/94 (!) 138/90  Pulse: 78 78  Resp: 16 16  Temp: 98.1 F (36.7 C) 98.2 F (36.8 C)  TempSrc:  Oral  SpO2: 100% 100%     Physical Exam Vitals and nursing note reviewed.  HENT:     Head: Normocephalic.  Eyes:     Extraocular Movements: Extraocular movements intact.     Pupils: Pupils are equal, round, and reactive to light.  Cardiovascular:     Rate and Rhythm: Normal rate and regular rhythm.     Heart sounds: Normal heart sounds.  Pulmonary:     Effort: Pulmonary effort is normal.     Breath sounds: Normal breath sounds.  Abdominal:     Palpations: Abdomen is soft.  Musculoskeletal:     Cervical back: Normal range of motion.     Right lower leg: No edema.     Left lower leg: No edema.  Comments: Moves all extremities spontaneously without difficulty  Skin:    General: Skin is warm and dry.  Neurological:     General: No focal deficit present.     Mental Status: She is alert and oriented to person, place, and time.     Comments: Facial expressions are symmetric and intact without evidence of facial droop Normal cerebellar testing without ataxia, including finger to nose     (all labs ordered are listed, but only abnormal results are displayed) Labs Reviewed  CBC - Abnormal; Notable for the following components:      Result Value   RBC 3.68 (*)    Hemoglobin 8.7 (*)    HCT 28.6 (*)    MCV 77.7 (*)    MCH 23.6  (*)    Platelets 460 (*)    All other components within normal limits  COMPREHENSIVE METABOLIC PANEL WITH GFR  PREGNANCY, URINE  URINALYSIS, ROUTINE W REFLEX MICROSCOPIC  CBG MONITORING, ED  TROPONIN T, HIGH SENSITIVITY    EKG: None  Radiology: DG Chest 2 View Result Date: 08/26/2024 EXAM: 2 VIEW(S) XRAY OF THE CHEST 08/26/2024 04:41:00 PM COMPARISON: 3 / 6 / 22 CLINICAL HISTORY: chest pain x24 hrs. Triage note: Woke up yesterday with a HA. Later in the day developed CP, SOB when standing. -N/-V/-D. Near syncope when standing too long. Just finished heavy menstrual cycle- anemic-has not been compliant with Fe+ supplement. FINDINGS: LUNGS AND PLEURA: No focal pulmonary opacity. No pulmonary edema. No pleural effusion. No pneumothorax. HEART AND MEDIASTINUM: No acute abnormality of the cardiac and mediastinal silhouettes. BONES AND SOFT TISSUES: No acute osseous abnormality. IMPRESSION: 1. Normal chest radiograph. No acute cardiopulmonary process. Electronically signed by: Norman Gatlin MD 08/26/2024 04:48 PM EDT RP Workstation: [id number]     Procedures   Medications Ordered in the ED  sodium chloride  0.9 % bolus 1,000 mL (1,000 mLs Intravenous New Bag/Given 08/26/24 1944)                                    Medical Decision Making This patient presents to the ED for concern of multiple complaints, this involves an extensive number of treatment options, and is a complaint that carries with it a high risk of complications and morbidity.  The differential diagnosis includes iron  deficiency anemia, electrolyte disturbance, dehydration, orthostatic hypotension, tension headache   Co morbidities that complicate the patient evaluation  Iron  deficiency anemia   Additional history obtained:  Additional history obtained from record review External records from outside source obtained and reviewed including previous PCP note   Lab Tests:  I Ordered, and personally interpreted labs.   The pertinent results include: CBC notable for hemoglobin of 8.7, most recent baseline was 10.1 approximately 1 year ago.  CMP within normal limits. Initial troponin < 15.  Urine hCG negative.   Imaging Studies ordered:  I ordered imaging studies including CXR  I independently visualized and interpreted imaging which showed 1. Normal chest radiograph. No acute cardiopulmonary process.  I agree with the radiologist interpretation   Cardiac Monitoring: / EKG:  The patient was maintained on a cardiac monitor.  I personally viewed and interpreted the cardiac monitored which showed an underlying rhythm of: NSR   Problem List / ED Course / Critical interventions / Medication management  I ordered medication including IV fluid bolus for rehydration I have reviewed the patients home medicines and have made adjustments as  needed   Test / Admission - Considered:  Physical exam is largely unremarkable as above.  I suspect that the patient's symptoms today are secondary to iron  deficiency anemia, as she has a hemoglobin of 8.7 today and has not been compliant with her iron  supplements due to severe constipation, she also recently came off of her menstrual cycle which she said was heavier than usual for her.  She has no focal neurologic deficits on exam, PERC negative.  ACS workup is reassuring as above, initial troponin is <15, CXR is unremarkable, patient is not experiencing chest pain or shortness of breath at this time and believes that her symptoms earlier in the day were secondary to anxiety, low suspicion for ACS based on these reassuring findings. Vitals are reassuring, she is mildly hypertensive but remains afebrile/not tachycardic. Orthostatic vitals obtained by nursing staff, as follows: Orthostatic Lying BP- Lying: 130/83Pulse- Lying: 73 Orthostatic Sitting BP- Sitting: 141/99 (!) (no dizziness)Pulse- Sitting: 76 Orthostatic Standing at 0 minutes BP- Standing at 0 minutes: 135/93 (!)  (feels a little light-headed)Pulse- Standing at 0 minutes: 79 Orthostatic Standing at 3 minutes BP- Standing at 3 minutes: 139/95 (!) (still light-headed)Pulse- Standing at 3 minutes: 78  Although patient did not become hypotensive, she did become lightheaded with positional changes while checking orthostatic vitals.  Will rehydrate with 1 L IV fluid bolus and reassess. Patient does endorse mild dull frontal headache, I offered migraine cocktail and discussed with patient that typically Compazine /Benadryl  is my preferred regiment given the fact that she took ibuprofen  before presenting to the emergency department today, she declines migraine cocktail as she does not want to feel sleepy.  I feel that this is reasonable plan, no focal neurologic deficits, I do not feel that any imaging of her brain is warranted and suspect that this may also be secondary to IDA versus tension headache. I recommend that patient restart an iron  supplement, recommend 325 mg ferrous sulfate  every other day along with stool softener and increase fluid intake to prevent constipation, I recommend that if she goes more than 1 day without having a bowel movement she may add on MiraLAX to this regiment.  She voiced understanding is in agreement this plan.  She is scheduled to follow-up with her PCP next week and will discuss her IDA at that visit as well, she may benefit from a recheck of her blood counts or an iron  panel for further evaluation of this.  Return precautions discussed, she is appropriate for discharge at this time.   Amount and/or Complexity of Data Reviewed Labs: ordered. Radiology: ordered.        Final diagnoses:  Iron  deficiency anemia, unspecified iron  deficiency anemia type    ED Discharge Orders     None          Glendia Rocky LOISE DEVONNA 08/26/24 2012    Simon Lavonia LOISE, MD 08/26/24 2318

## 2024-08-27 ENCOUNTER — Encounter (INDEPENDENT_AMBULATORY_CARE_PROVIDER_SITE_OTHER): Payer: Self-pay | Admitting: Family Medicine

## 2024-08-28 NOTE — Telephone Encounter (Signed)
**Note De-identified  [place] Obfuscation** Please advise 

## 2024-09-04 ENCOUNTER — Ambulatory Visit (INDEPENDENT_AMBULATORY_CARE_PROVIDER_SITE_OTHER): Admitting: Family Medicine

## 2024-09-04 ENCOUNTER — Telehealth: Admitting: Emergency Medicine

## 2024-09-04 VITALS — BP 94/63 | HR 77 | Temp 98.1°F | Ht 64.0 in | Wt 157.0 lb

## 2024-09-04 DIAGNOSIS — R3989 Other symptoms and signs involving the genitourinary system: Secondary | ICD-10-CM

## 2024-09-04 DIAGNOSIS — D508 Other iron deficiency anemias: Secondary | ICD-10-CM | POA: Diagnosis not present

## 2024-09-04 DIAGNOSIS — Z6826 Body mass index (BMI) 26.0-26.9, adult: Secondary | ICD-10-CM

## 2024-09-04 MED ORDER — SULFAMETHOXAZOLE-TRIMETHOPRIM 800-160 MG PO TABS: 1.0000 | ORAL_TABLET | Freq: Two times a day (BID) | ORAL | 0 refills | Status: AC

## 2024-09-04 NOTE — Progress Notes (Signed)
   SUBJECTIVE:  Chief Complaint: Obesity  Interim History: Patient here for routine follow up.  She has been employing mindful eating and thinks she is staying around her total calorie budget and protein intake.  She is trying to focus on total protein intake.  Cheerleading has started winding down.    Julie Mcneil is here to discuss her progress with her obesity treatment plan. She is on the Category 1 Plan and states she is following her eating plan approximately 75% of the time. She states she is walking.   OBJECTIVE: Visit Diagnoses: Problem List Items Addressed This Visit   None   Vitals Temp: 98.1 F (36.7 C) BP: 94/63 Pulse Rate: 77 SpO2: 100 %   Anthropometric Measurements Height: 5' 4 (1.626 m) Weight: 157 lb (71.2 kg) BMI (Calculated): 26.94 Weight at Last Visit: 156 lb Weight Lost Since Last Visit: 0 Weight Gained Since Last Visit: 1 Starting Weight: 273 lb Total Weight Loss (lbs): 116 lb (52.6 kg)   Body Composition  Body Fat %: 34.4 % Fat Mass (lbs): 54.2 lbs Muscle Mass (lbs): 98 lbs Total Body Water  (lbs): 80.6 lbs Visceral Fat Rating : 6   Other Clinical Data Today's Visit #: 30 Starting Date: 01/18/20 Comments: Cat 1     ASSESSMENT AND PLAN: Assessment & Plan Other iron  deficiency anemia Recent labs reviewed today.  H/H and MCV low despite continued oral supplementation of iron .  Will refer to hematology for further evaluation and treatment- may ultimately need iron  infusions given poor PO absorption. Morbid obesity (HCC)  BMI 26.0-26.9,adult    Diet: Julie Mcneil is currently in the action stage of change. As such, her goal is to continue with weight loss efforts and has agreed to the Category 1 Plan.   Exercise:  For substantial health benefits, adults should do at least 150 minutes (2 hours and 30 minutes) a week of moderate-intensity, or 75 minutes (1 hour and 15 minutes) a week of vigorous-intensity aerobic physical activity, or an  equivalent combination of moderate- and vigorous-intensity aerobic activity. Aerobic activity should be performed in episodes of at least 10 minutes, and preferably, it should be spread throughout the week.  Behavior Modification:  We discussed the following Behavioral Modification Strategies today: increasing lean protein intake, decreasing simple carbohydrates, meal planning and cooking strategies, keeping healthy foods in the home, and holiday eating strategies. We discussed various medication options to help [name] with her weight loss efforts and we both agreed to continue Zepbound  at current dose with no change in dosage.  Follow up in 6 weeks.   She was informed of the importance of frequent follow up visits to maximize her success with intensive lifestyle modifications for her multiple health conditions.  Attestation Statements:   Reviewed by clinician on day of visit: allergies, medications, problem list, medical history, surgical history, family history, social history, and previous encounter notes.   Adelita Cho, MD

## 2024-09-04 NOTE — Progress Notes (Signed)

## 2024-09-11 ENCOUNTER — Other Ambulatory Visit (HOSPITAL_COMMUNITY): Payer: Self-pay

## 2024-09-18 ENCOUNTER — Other Ambulatory Visit (HOSPITAL_COMMUNITY): Payer: Self-pay

## 2024-09-22 ENCOUNTER — Encounter (INDEPENDENT_AMBULATORY_CARE_PROVIDER_SITE_OTHER): Payer: Self-pay | Admitting: Family Medicine

## 2024-09-23 NOTE — Assessment & Plan Note (Signed)
 Recent labs reviewed today.  H/H and MCV low despite continued oral supplementation of iron .  Will refer to hematology for further evaluation and treatment- may ultimately need iron  infusions given poor PO absorption.

## 2024-10-11 ENCOUNTER — Other Ambulatory Visit (HOSPITAL_COMMUNITY): Payer: Self-pay

## 2024-10-14 ENCOUNTER — Other Ambulatory Visit (HOSPITAL_COMMUNITY): Payer: Self-pay

## 2024-10-14 ENCOUNTER — Encounter (INDEPENDENT_AMBULATORY_CARE_PROVIDER_SITE_OTHER): Payer: Self-pay | Admitting: Family Medicine

## 2024-10-15 ENCOUNTER — Other Ambulatory Visit (HOSPITAL_COMMUNITY): Payer: Self-pay

## 2024-10-16 ENCOUNTER — Telehealth: Admitting: Physician Assistant

## 2024-10-16 ENCOUNTER — Inpatient Hospital Stay

## 2024-10-16 ENCOUNTER — Other Ambulatory Visit (HOSPITAL_COMMUNITY): Payer: Self-pay

## 2024-10-16 ENCOUNTER — Inpatient Hospital Stay: Attending: Hematology and Oncology | Admitting: Hematology and Oncology

## 2024-10-16 VITALS — BP 137/88 | HR 73 | Temp 98.1°F | Resp 13 | Wt 167.8 lb

## 2024-10-16 DIAGNOSIS — D509 Iron deficiency anemia, unspecified: Secondary | ICD-10-CM | POA: Insufficient documentation

## 2024-10-16 DIAGNOSIS — Z9884 Bariatric surgery status: Secondary | ICD-10-CM | POA: Diagnosis not present

## 2024-10-16 DIAGNOSIS — N920 Excessive and frequent menstruation with regular cycle: Secondary | ICD-10-CM | POA: Diagnosis not present

## 2024-10-16 DIAGNOSIS — R7303 Prediabetes: Secondary | ICD-10-CM | POA: Insufficient documentation

## 2024-10-16 DIAGNOSIS — B353 Tinea pedis: Secondary | ICD-10-CM | POA: Diagnosis not present

## 2024-10-16 DIAGNOSIS — D500 Iron deficiency anemia secondary to blood loss (chronic): Secondary | ICD-10-CM

## 2024-10-16 LAB — CBC WITH DIFFERENTIAL (CANCER CENTER ONLY)
Abs Immature Granulocytes: 0.01 K/uL (ref 0.00–0.07)
Basophils Absolute: 0.1 K/uL (ref 0.0–0.1)
Basophils Relative: 2 %
Eosinophils Absolute: 0.2 K/uL (ref 0.0–0.5)
Eosinophils Relative: 3 %
HCT: 27.3 % — ABNORMAL LOW (ref 36.0–46.0)
Hemoglobin: 8.1 g/dL — ABNORMAL LOW (ref 12.0–15.0)
Immature Granulocytes: 0 %
Lymphocytes Relative: 36 %
Lymphs Abs: 1.8 K/uL (ref 0.7–4.0)
MCH: 21.9 pg — ABNORMAL LOW (ref 26.0–34.0)
MCHC: 29.7 g/dL — ABNORMAL LOW (ref 30.0–36.0)
MCV: 73.8 fL — ABNORMAL LOW (ref 80.0–100.0)
Monocytes Absolute: 0.3 K/uL (ref 0.1–1.0)
Monocytes Relative: 7 %
Neutro Abs: 2.6 K/uL (ref 1.7–7.7)
Neutrophils Relative %: 52 %
Platelet Count: 449 K/uL — ABNORMAL HIGH (ref 150–400)
RBC: 3.7 MIL/uL — ABNORMAL LOW (ref 3.87–5.11)
RDW: 15.2 % (ref 11.5–15.5)
WBC Count: 5 K/uL (ref 4.0–10.5)
nRBC: 0 % (ref 0.0–0.2)

## 2024-10-16 LAB — CMP (CANCER CENTER ONLY)
ALT: 13 U/L (ref 0–44)
AST: 22 U/L (ref 15–41)
Albumin: 4.3 g/dL (ref 3.5–5.0)
Alkaline Phosphatase: 80 U/L (ref 38–126)
Anion gap: 9 (ref 5–15)
BUN: 12 mg/dL (ref 6–20)
CO2: 26 mmol/L (ref 22–32)
Calcium: 9 mg/dL (ref 8.9–10.3)
Chloride: 105 mmol/L (ref 98–111)
Creatinine: 0.75 mg/dL (ref 0.44–1.00)
GFR, Estimated: >60 mL/min (ref >60)
Glucose, Bld: 93 mg/dL (ref 70–99)
Potassium: 4.2 mmol/L (ref 3.5–5.1)
Sodium: 140 mmol/L (ref 135–145)
Total Bilirubin: 0.9 mg/dL (ref 0.0–1.2)
Total Protein: 7.3 g/dL (ref 6.5–8.1)

## 2024-10-16 LAB — FOLATE: Folate: 4.5 ng/mL — ABNORMAL LOW (ref >5.9)

## 2024-10-16 LAB — FERRITIN: Ferritin: 5 ng/mL — ABNORMAL LOW (ref 11–307)

## 2024-10-16 LAB — RETIC PANEL
Immature Retic Fract: 11.2 % (ref 2.3–15.9)
RBC.: 3.72 MIL/uL — ABNORMAL LOW (ref 3.87–5.11)
Retic Count, Absolute: 29.8 K/uL (ref 19.0–186.0)
Retic Ct Pct: 0.8 % (ref 0.4–3.1)
Reticulocyte Hemoglobin: 18.9 pg — ABNORMAL LOW (ref >27.9)

## 2024-10-16 LAB — IRON AND IRON BINDING CAPACITY (CC-WL,HP ONLY)
Iron: 16 ug/dL — ABNORMAL LOW (ref 28–170)
Saturation Ratios: 3 % — ABNORMAL LOW (ref 10.4–31.8)
TIBC: 482 ug/dL — ABNORMAL HIGH (ref 250–450)
UIBC: 465 ug/dL

## 2024-10-16 LAB — VITAMIN B12: Vitamin B-12: 524 pg/mL (ref 180–914)

## 2024-10-16 MED ORDER — FLUCONAZOLE 150 MG PO TABS
150.0000 mg | ORAL_TABLET | ORAL | 0 refills | Status: AC
  Filled 2024-10-16: qty 3, 21d supply, fill #0

## 2024-10-16 NOTE — Progress Notes (Signed)
 Julie Mcneil [phone]   [phone]  INITIAL CONSULT NOTE  Patient Care Team: Norleen Lynwood ORN, MD as PCP - General (Internal Medicine) Claudene Arthea HERO, DO (Sports Medicine)  Hematological/Oncological History # Iron  Deficiency Anemia in Setting of Gastric Bypass/ GYN Bleeding 08/26/2024: White blood cell 5.1, hemoglobin 8.7, MCV 77.7, platelets 460  10/16/2024: Establish care with Dr. Federico   CHIEF COMPLAINTS/PURPOSE OF CONSULTATION:   Iron  Deficiency Anemia   HISTORY OF PRESENTING ILLNESS:  Julie Mcneil 43 y.o. female with medical history significant for anxiety, GERD, gastric sleeve surgery in 2023, prediabetes, and vertigo who presents for evaluation of iron  deficiency anemia.  On review of the previous records Julie Mcneil had labs collected on 08/26/2024 which showed a white blood cell count 5.1, hemoglobin 8.7, MCV 77.7, and platelets of 460.  Additionally ferritin levels were found to be 6 on 08/15/2023 and 2 on 06/18/2023.  Due to concern for her persistent iron  deficiency anemia she was referred to hematology for further evaluation and management.  On exam today Julie Mcneil reports she underwent her gastric sleeve procedure in 2023 and has been having issues of feeling like her iron  has been low since that time.  She reports that she has been taking iron  pills but recently switched over to liquid iron .  She reports it tastes bad.  She reports that she has not noticed much benefit from the oral iron .  She reports her menstrual cycles last for 7 to 10 days and typically she goes through a pad every 2-3 hours.  Sometimes she goes through pads and tampons and they are both soaked.  She reports she has had her tubes tied and is not currently on any other medications for her menstrual cycles.  She reports that she is on a nonrestrictive diet.  Her energy levels today are about a 5 out of 10.  She has had no issues with bleeding elsewhere such as  nosebleeds, gum bleeding, or blood in the urine/stool.  She reports she is never received IV iron  therapy before.  On further discussion she reports that her mother passed away in a house fire and her paternal great grandmother had some form of blood issues.  Her father has hypertension and type 2 diabetes.  She has 2 sisters who also have heavy menstrual cycles.  She has 3 children who are healthy.  She is a never smoker and does drink alcohol once every 6 months or so.  She currently works as an Interior And Spatial Designer for [employer].  Otherwise she denies any fevers, chills, sweats, nausea, vomiting or diarrhea.  A full 10 point ROS is otherwise negative.  MEDICAL HISTORY:  Past Medical History:  Diagnosis Date   Anemia    Anxiety    Panic attacks   Cervical dysplasia    Family history of adverse reaction to anesthesia    Sister BP drops   GERD (gastroesophageal reflux disease)    occasional   Obesity    Palpitations    Pneumonia    Pre-diabetes    UTI (urinary tract infection)    recurrent   Vaginal yeast infection    recurrent   Vertigo    Vitamin D  deficiency     SURGICAL HISTORY: Past Surgical History:  Procedure Laterality Date   CESAREAN SECTION  2007,2011,2013   11-28-11   COLPOSCOPY     LAPAROSCOPIC GASTRIC SLEEVE RESECTION N/A 08/16/2021   Procedure: LAPAROSCOPIC GASTRIC SLEEVE RESECTION;  Surgeon: Signe Mitzie DELENA,  MD;  Location: [facility];  Service: General;  Laterality: N/A;   TUBAL LIGATION     2013 C-SECTION   UPPER GI ENDOSCOPY N/A 08/16/2021   Procedure: UPPER GI ENDOSCOPY;  Surgeon: Signe Mitzie LABOR, MD;  Location: [facility];  Service: General;  Laterality: N/A;    SOCIAL HISTORY: Social History   Socioeconomic History   Marital status: Married    Spouse name: Jackquelyn Sundberg   Number of children: 3   Years of education: 15   Highest education level: Not on file  Occupational History   Occupation: Advertising Account Planner    Comment: Development Worker, [employer]   Occupation: recruiting and Occupational Psychologist  Tobacco Use   Smoking status: Never   Smokeless tobacco: Never  Vaping Use   Vaping status: Never Used  Substance and Sexual Activity   Alcohol use: Yes    Comment: rare   Drug use: No   Sexual activity: Yes    Birth control/protection: Surgical    Comment: BTL  Other Topics Concern   Not on file  Social History Narrative   Fun: Going to [employer]   Social Drivers of Health   Tobacco Use: Low Risk (02/14/2024)   Patient History    Smoking Tobacco Use: Never    Smokeless Tobacco Use: Never    Passive Exposure: Not on file  Financial Resource Strain: Not on file  Food Insecurity: No Food Insecurity (10/16/2024)   Epic    Worried About Programme Researcher, Broadcasting/film/video in the Last Year: Never true    Ran Out of Food in the Last Year: Never true  Transportation Needs: No Transportation Needs (10/16/2024)   Epic    Lack of Transportation (Medical): No    Lack of Transportation (Non-Medical): No  Physical Activity: Not on file  Stress: Not on file  Social Connections: Not on file  Intimate Partner Violence: Not At Risk (10/16/2024)   Epic    Fear of Current or Ex-Partner: No    Emotionally Abused: No    Physically Abused: No    Sexually Abused: No  Depression (PHQ2-9): Low Risk (10/16/2024)   Depression (PHQ2-9)    PHQ-2 Score: 0  Alcohol Screen: Not on file  Housing: Unknown (10/16/2024)   Epic    Unable to Pay for Housing in the Last Year: Patient declined    Number of Times Moved in the Last Year: 0    Homeless in the Last Year: No  Utilities: Not At Risk (10/16/2024)   Epic    Threatened with loss of utilities: No  Health Literacy: Not on file    FAMILY HISTORY: Family History  Problem Relation Age of Onset   Early death Mother        House Fire   Heart disease Maternal Grandmother    Diabetes Maternal Grandmother    Cancer Maternal Grandmother        Lymphoma   Diabetes Father    Hypertension Father     Hyperlipidemia Father    Hypertension Paternal Grandmother    Stroke Paternal Grandmother    Hypertension Paternal Grandfather    Anesthesia problems Neg Hx    Hypotension Neg Hx    Malignant hyperthermia Neg Hx    Pseudochol deficiency Neg Hx     ALLERGIES:  is allergic to sumatriptan , azithromycin , levaquin  [levofloxacin  in d5w], nitrofurantoin monohyd macro, and cefdinir .  MEDICATIONS:  Current Outpatient Medications  Medication Sig Dispense Refill   fluconazole  (DIFLUCAN ) 150 MG tablet Take  1 tablet (150 mg total) by mouth once a week. 3 tablet 0   Multiple Vitamin (MULTIVITAMIN ADULT PO) Take by mouth.     nystatin  cream (MYCOSTATIN ) Apply 1 Application topically 2 (two) times daily. 30 g 0   pantoprazole  (PROTONIX ) 40 MG tablet Take 1 tablet (40 mg total) by mouth daily. 30 tablet 2   tirzepatide  (ZEPBOUND ) 10 MG/0.5ML Pen Inject 10 mg into the skin once a week. 6 mL 0   Vitamin D , Ergocalciferol , (DRISDOL ) 1.25 MG (50000 UNIT) CAPS capsule Take 1 capsule (50,000 Units total) by mouth every 7 (seven) days. 4 capsule 0   Iron -FA-B Cmp-C-Biot-Probiotic (FUSION PLUS) CAPS Take 2 capsules by mouth daily. (Patient not taking: Reported on 10/16/2024) 180 capsule 0   No current facility-administered medications for this visit.    REVIEW OF SYSTEMS:   Constitutional: ( - ) fevers, ( - )  chills , ( - ) night sweats Eyes: ( - ) blurriness of vision, ( - ) double vision, ( - ) watery eyes Ears, nose, mouth, throat, and face: ( - ) mucositis, ( - ) sore throat Respiratory: ( - ) cough, ( - ) dyspnea, ( - ) wheezes Cardiovascular: ( - ) palpitation, ( - ) chest discomfort, ( - ) lower extremity swelling Gastrointestinal:  ( - ) nausea, ( - ) heartburn, ( - ) change in bowel habits Skin: ( - ) abnormal skin rashes Lymphatics: ( - ) new lymphadenopathy, ( - ) easy bruising Neurological: ( - ) numbness, ( - ) tingling, ( - ) new weaknesses Behavioral/Psych: ( - ) mood change, ( - ) new  changes  All other systems were reviewed with the patient and are negative.  PHYSICAL EXAMINATION:  Vitals:   10/16/24 1305  BP: 137/88  Pulse: 73  Resp: 13  Temp: 98.1 F (36.7 C)  SpO2: 100%   Filed Weights   10/16/24 1305  Weight: 167 lb 12.8 oz (76.1 kg)    GENERAL: well appearing middle-age Caucasian female in NAD  SKIN: skin color, texture, turgor are normal, no rashes or significant lesions EYES: conjunctiva are pink and non-injected, sclera clear LUNGS: clear to auscultation and percussion with normal breathing effort HEART: regular rate & rhythm and no murmurs and no lower extremity edema Musculoskeletal: no cyanosis of digits and no clubbing  PSYCH: alert & oriented x 3, fluent speech NEURO: no focal motor/sensory deficits  LABORATORY DATA:  I have reviewed the data as listed    Latest Ref Rng & Units 08/26/2024    4:50 PM 08/15/2023    9:33 AM 06/18/2023    9:32 AM  CBC  WBC 4.0 - 10.5 K/uL 5.1  5.8  6.1   Hemoglobin 12.0 - 15.0 g/dL 8.7  89.8  8.6   Hematocrit 36.0 - 46.0 % 28.6  34.4  28.9   Platelets 150 - 400 K/uL 460  379  419        Latest Ref Rng & Units 08/26/2024    4:50 PM 08/15/2023    9:33 AM 08/17/2021    4:15 AM  CMP  Glucose 70 - 99 mg/dL 92  80  870   BUN 6 - 20 mg/dL 9  7  8    Creatinine 0.44 - 1.00 mg/dL 9.16  9.33  9.38   Sodium 135 - 145 mmol/L 140  142  135   Potassium 3.5 - 5.1 mmol/L 4.4  4.5  4.0   Chloride 98 - 111 mmol/L 105  105  104   CO2 22 - 32 mmol/L 24  23  21    Calcium 8.9 - 10.3 mg/dL 9.5  9.3  8.3   Total Protein 6.5 - 8.1 g/dL 7.4  6.8  6.7   Total Bilirubin 0.0 - 1.2 mg/dL 1.0  1.1  0.7   Alkaline Phos 38 - 126 U/L 82  84  61   AST 15 - 41 U/L 16  12  19    ALT 0 - 44 U/L 9  8  17       ASSESSMENT & PLAN Almarie DELENA Pyles 43 y.o. female with medical history significant for anxiety, GERD, gastric sleeve surgery in 2023, prediabetes, and vertigo who presents for evaluation of iron  deficiency anemia.  After  review of the labs, review of the records, and discussion with the patient the patients findings are most consistent with iron  deficiency anemia due to gastric bypass surgery as well as GYN bleeding.  # Iron  Deficiency Anemia 2/2 to Gastric Bypass # Iron  Deficiency Anemia 2/2 to GYN Bleeding  -- Findings are consistent with iron  deficiency anemia secondary to gastric bypass --Patient's who have undergone gastric bypass surgery are unable to adequately absorb iron  from their diet and p.o. iron  therapy.  Patient will require IV iron  therapy. --Patient is undergone tubal ligation, encouraged her to follow with her OB/GYN for consideration of treatment of her heavy menstrual cycles --We will confirm iron  deficiency anemia by ordering iron  panel and ferritin as well as reticulocytes, CBC, and CMP --Recommend holding further p.o. iron  therapy. --additionally will check Vitamin b12 and folate levels.  --We will plan to proceed with IV iron  therapy in order to help bolster the patient's blood counts --Plan for return to clinic in 4 to 6 weeks time after last dose of IV iron    Orders Placed This Encounter  Procedures   CBC with Differential ([facility] Only)    Standing Status:   Future    Number of Occurrences:   1    Expiration Date:   10/16/2025   CMP ([facility] only)    Standing Status:   Future    Number of Occurrences:   1    Expiration Date:   10/16/2025   Ferritin    Standing Status:   Future    Number of Occurrences:   1    Expiration Date:   10/16/2025   Iron  and Iron  Binding Capacity ([facility] only)    Standing Status:   Future    Number of Occurrences:   1    Expiration Date:   10/16/2025   Retic Panel    Standing Status:   Future    Number of Occurrences:   1    Expiration Date:   10/16/2025   Vitamin B12    Standing Status:   Future    Number of Occurrences:   1    Expiration Date:   10/16/2025   Methylmalonic acid, serum    Standing Status:   Future    Number  of Occurrences:   1    Expiration Date:   10/16/2025   Folate, Serum    Standing Status:   Future    Number of Occurrences:   1    Expiration Date:   10/16/2025    All questions were answered. The patient knows to call the clinic with any problems, questions or concerns.  A total of more than 60 minutes were spent on this encounter with face-to-face time and non-face-to-face  time, including preparing to see the patient, ordering tests and/or medications, counseling the patient and coordination of care as outlined above.   Norleen IVAR Kidney, MD Department of Hematology/Oncology [facility] Phone: [phone] Pager: [phone] Email: [email or web address]  10/16/2024 1:55 PM

## 2024-10-16 NOTE — Progress Notes (Signed)
 E-Visit for Athlete's Foot  We are sorry that you are not feeling well. Here is how we plan to help!  Based on what you shared with me it looks like you have tinea pedis, or Athletes Foot.  This type of rash can spread through shared towels, clothing, bedding, etc., as well as hard surfaces (particularly in moist areas) such as shower stalls, locker room floors, pool areas, etc. The symptoms of Athletes Foot include red, swollen, peeling, itchy skin between the toes (especially between the pinky toe and the one next to it). The sole and heel of the foot may also be affected. In severe cases, the skin on the feet can blister.  Athletes foot can usually be treated with over-the-counter topical antifungal products; but sometimes with chronic or extensive tinea pedis, prescription oral medications are needed.   I am recommending:Terbinafine  1% cream or gel, apply to area once or twice per day   Prescription medications are only indicated for an extensive rash or if over the counter treatments have failed.  I am prescribing:Fluconazole  150 mg once weekly for two to four weeks  HOME CARE:  Keep feet clean, dry, and cool. Avoid using swimming pools, public showers, or foot baths. Wear sandals when possible or air shoes out by alternating them every 2-3 days. Avoid wearing closed shoes and wearing socks made from fabric that doesnt dry easily (for example, nylon). Treat the infection with recommended medication  GET HELP RIGHT AWAY IF:  Symptoms that dont go away after treatment. Severe itching that persists. If your rash spreads or swells. If your rash begins to have drainage or smell. You develop a fever.  MAKE SURE YOU   Understand these instructions. Will watch your condition. Will get help right away if you are not doing well or get worse.   Thank you for choosing an e-visit.  Your e-visit answers were reviewed by a board certified advanced clinical practitioner  to complete your personal care plan. Depending upon the condition, your plan could have included both over the counter or prescription medications.  Please review your pharmacy choice. Make sure the pharmacy is open so you can pick up prescription now. If there is a problem, you may contact your provider through [employer] and have the prescription routed to another pharmacy.  Your safety is important to us . If you have drug allergies check your prescription carefully.   For the next 24 hours you can use MyChart to ask questions about todays visit, request a non-urgent call back, or ask for a work or school excuse.  You will get an email in the next two days asking about your experience. I hope that your e-visit has been valuable and will speed your recovery  References or for more information:  fatmenus.com.au?search=athletes%26foot%20treatment&source=search_result&selectedTitle=1~104&usage_type=default&display_rank=1  [email or web address].com.ee    I have spent 5 minutes in review of e-visit questionnaire, review and updating patient chart, medical decision making and response to patient.   Elsie Velma Lunger, PA-C

## 2024-10-20 LAB — METHYLMALONIC ACID, SERUM: Methylmalonic Acid, Quantitative: 98 nmol/L (ref 0–378)

## 2024-10-21 ENCOUNTER — Other Ambulatory Visit: Payer: Self-pay | Admitting: Hematology and Oncology

## 2024-10-21 ENCOUNTER — Encounter: Payer: Self-pay | Admitting: Hematology and Oncology

## 2024-10-21 MED ORDER — FOLIC ACID 1 MG PO TABS: 1.0000 mg | ORAL_TABLET | Freq: Every day | ORAL | 1 refills | Status: AC

## 2024-10-22 ENCOUNTER — Other Ambulatory Visit (HOSPITAL_COMMUNITY): Payer: Self-pay | Admitting: Hematology and Oncology

## 2024-10-22 ENCOUNTER — Telehealth (HOSPITAL_COMMUNITY): Payer: Self-pay | Admitting: Pharmacy Technician

## 2024-10-22 NOTE — Telephone Encounter (Signed)
 Dr Federico,  Monoferric is non-preferred med on insurance, must have tried and failed preferred meds first. Preferred med is Venofer. Would you like to change to Venofer?  Dagoberto Barrows Submission: NO AUTH NEEDED Site of care: [facility] Payer: [employer] commercial Medication & CPT/J Code(s) submitted: Venofer (Iron  Sucrose) J1756 Diagnosis Code: D50.0 Route of submission (phone, fax, portal):  Phone # Fax # Auth type: Buy/Bill HB Units/visits requested: 200MG  X 5 DOSES OR 300MG  X 3 DOSES Reference number: [id number] Approval from: 10/22/2024 to 01/20/25    Dagoberto Armour, CPhT [facility] Phone: [phone] 10/22/2024

## 2024-10-23 ENCOUNTER — Other Ambulatory Visit (HOSPITAL_COMMUNITY): Payer: Self-pay | Admitting: Hematology and Oncology

## 2024-10-24 ENCOUNTER — Encounter (HOSPITAL_COMMUNITY): Payer: Self-pay | Admitting: Hematology and Oncology

## 2024-10-24 ENCOUNTER — Telehealth: Payer: Self-pay | Admitting: Hematology and Oncology

## 2024-10-24 ENCOUNTER — Telehealth: Admitting: Family Medicine

## 2024-10-24 DIAGNOSIS — U071 COVID-19: Secondary | ICD-10-CM | POA: Diagnosis not present

## 2024-10-24 NOTE — Telephone Encounter (Signed)
 I spoke with patient to schedule lab and MD for 12/12/24 and 12/19/24. Patient aware of dates/times.

## 2024-10-24 NOTE — Telephone Encounter (Signed)
 Auth Submission: NO AUTH NEEDED Site of care: Site of care: [facility] Payer: [employer]c Medication & CPT/J Code(s) submitted: Venofer  (Iron  Sucrose) J1756 Diagnosis Code:  Route of submission (phone, fax, portal): portal Phone # Fax # Auth type: Buy/Bill PB Units/visits requested: 200mg  x 5 doses Reference number:  Approval from: 10/24/24 to 11/05/24

## 2024-10-25 ENCOUNTER — Other Ambulatory Visit (HOSPITAL_COMMUNITY): Payer: Self-pay

## 2024-10-25 MED ORDER — FLUTICASONE PROPIONATE 50 MCG/ACT NA SUSP
2.0000 | Freq: Every day | NASAL | 0 refills | Status: AC
  Filled 2024-10-25: qty 16, 30d supply, fill #0

## 2024-10-25 MED ORDER — BENZONATATE 100 MG PO CAPS
100.0000 mg | ORAL_CAPSULE | Freq: Three times a day (TID) | ORAL | 0 refills | Status: AC | PRN
  Filled 2024-10-25: qty 21, 7d supply, fill #0

## 2024-10-25 NOTE — Addendum Note (Signed)
 Addended byBETHA ROLAN BERTHOLD on: 10/25/2024 03:50 PM   Modules accepted: Level of Service

## 2024-10-25 NOTE — Progress Notes (Signed)
 Your test for COVID-19 was positive, meaning that you were infected with the novel coronavirus and could give the germ to others.    Most people with these infections have a mild illness and can recover at home without medical care. Do not leave your home, except to get medical care.  DO not visit public areas and do not go to places where you are unable to wear a mask. It is important for you to stay home to take care of yourself and to help protect other people in your home and community.   Isolation Instructions:  You are to isolate at home for now until you have taken your home COVID/Flu test and notified our team of your results, at which time further isolation instructions will be given.  If you must be around other household members who do not have symptoms, you need to make sure that both you and the family members are masking consistently with a high-quality mask, even while in the home.  If you note any worsening of symptoms despite treatment, please seek an IN-PERSON evaluation ASAP. If you note any significant shortness of breath or any chest pain, please seek immediate ER evaluation. Please do not delay care!  Go to the nearest hospital ED for assessment if fever/cough/breathlessness are severe or illness seems like a threat to life.    The following symptoms may appear 2-14 days after exposure: Fever Cough Shortness of breath or difficulty breathing Chills Repeated shaking with chills Muscle pain Headache Sore throat New loss of taste or smell Fatigue Congestion or runny nose Nausea or vomiting Diarrhea   For symptoms,  I have prescribed Tessalon  Perles 100 mg. You may take 1-2 capsules every 8 hours as needed for cough and I have prescribed Fluticasone  nasal spray 2 sprays in each nostril one time per dayasal spray 2 sprays in each nostril one time per day You may also take acetaminophen  (Tylenol ) as needed for fever.   Reduce your risk of any infection by using the  same precautions used for avoiding the common cold or flu:  Wash your hands often with soap and warm water  for at least 20 seconds.  If soap and water  are not readily available, use an alcohol-based hand sanitizer with at least 60% alcohol.  If coughing or sneezing, cover your mouth and nose by coughing or sneezing into the elbow areas of your shirt or coat, into a tissue or into your sleeve (not your hands). Avoid shaking hands with others and consider head nods or verbal greetings only. Avoid touching your eyes, nose, or mouth with unwashed hands.  Avoid close contact with people who are sick. Avoid places or events with large numbers of people in one location, like concerts or sporting events. Carefully consider travel plans you have or are making. If you are planning any travel outside or inside the [place] , visit the CDC's Travelers' Health webpage for the latest health notices. If you have some symptoms but not all symptoms, continue to monitor at home and seek medical attention if your symptoms worsen. If you are having a medical emergency, call 911.  HOME CARE Only take medications as instructed by your medical team. Drink plenty of fluids and get plenty of rest. A steam or ultrasonic humidifier can help if you have congestion.   GET HELP RIGHT AWAY IF YOU HAVE EMERGENCY WARNING SIGNS** FOR COVID-19. If you or someone is showing any of these signs seek emergency medical care immediately. Call 911 or proceed  to your closest emergency facility if: You develop worsening high fever. Trouble breathing. Bluish lips or face. Persistent pain or pressure in the chest. New confusion. Inability to wake or stay awake. You cough up blood. Your symptoms become more severe.  **This list is not all possible symptoms. Contact your medical provider for any symptoms that are sever or concerning to you.   MAKE SURE YOU  Understand these instructions. Will watch your condition. Will get help right  away if you are not doing well or get worse.  Your e-visit answers were reviewed by a board certified advanced clinical practitioner to complete your personal care plan.  Depending on the condition, your plan could have included both over the counter or prescription medications.  If there is a problem, please reply once you have received a response from your provider.  Your safety is important to us .  If you have drug allergies check your prescription carefully.    You can use MyChart to ask questions about today's visit, request a non-urgent call back, or ask for a work or school excuse for 24 hours related to this e-Visit. If it has been greater than 24 hours you will need to follow up with your provider, or enter a new e-Visit to address those concerns. You will get an e-mail in the next two days asking about your experience.  I hope that your e-visit has been valuable and will speed your recovery. Thank you for using e-visits.   I have spent 5 minutes in review of e-visit questionnaire, review and updating patient chart, medical decision making and response to patient.   Roosvelt Mater, PA-C

## 2024-10-28 ENCOUNTER — Other Ambulatory Visit (INDEPENDENT_AMBULATORY_CARE_PROVIDER_SITE_OTHER): Payer: Self-pay | Admitting: Family Medicine

## 2024-10-28 ENCOUNTER — Other Ambulatory Visit (HOSPITAL_COMMUNITY): Payer: Self-pay

## 2024-10-28 ENCOUNTER — Encounter (INDEPENDENT_AMBULATORY_CARE_PROVIDER_SITE_OTHER): Payer: Self-pay | Admitting: Family Medicine

## 2024-10-28 DIAGNOSIS — F411 Generalized anxiety disorder: Secondary | ICD-10-CM

## 2024-10-28 DIAGNOSIS — F339 Major depressive disorder, recurrent, unspecified: Secondary | ICD-10-CM

## 2024-10-28 MED ORDER — BUPROPION HCL ER (SR) 150 MG PO TB12
150.0000 mg | ORAL_TABLET | Freq: Every day | ORAL | 0 refills | Status: AC
  Filled 2024-10-28: qty 30, 30d supply, fill #0

## 2024-10-28 MED ORDER — BUSPIRONE HCL 5 MG PO TABS
5.0000 mg | ORAL_TABLET | Freq: Three times a day (TID) | ORAL | 0 refills | Status: AC | PRN
  Filled 2024-10-28: qty 45, 15d supply, fill #0

## 2024-10-28 MED ORDER — CITALOPRAM HYDROBROMIDE 20 MG PO TABS
20.0000 mg | ORAL_TABLET | Freq: Every day | ORAL | 0 refills | Status: AC
  Filled 2024-10-28: qty 30, 30d supply, fill #0

## 2024-10-31 ENCOUNTER — Encounter (HOSPITAL_COMMUNITY)
Admission: RE | Admit: 2024-10-31 | Discharge: 2024-10-31 | Disposition: A | Discharge status: Home / Self Care | Source: Ambulatory Visit | Attending: Hematology and Oncology | Admitting: Hematology and Oncology

## 2024-10-31 VITALS — BP 127/79 | HR 71 | Temp 97.5°F | Resp 16

## 2024-10-31 DIAGNOSIS — D500 Iron deficiency anemia secondary to blood loss (chronic): Secondary | ICD-10-CM | POA: Insufficient documentation

## 2024-10-31 MED ORDER — IRON SUCROSE 200 MG IVPB - SIMPLE MED
200.0000 mg | Freq: Once | Status: AC
  Administered 2024-10-31: 200 mg via INTRAVENOUS
  Filled 2024-10-31: qty 200

## 2024-11-07 ENCOUNTER — Ambulatory Visit (HOSPITAL_COMMUNITY)
Admission: RE | Admit: 2024-11-07 | Discharge: 2024-11-07 | Disposition: A | Discharge status: Home / Self Care | Source: Ambulatory Visit | Attending: Hematology and Oncology | Admitting: Hematology and Oncology

## 2024-11-07 VITALS — BP 103/75 | HR 72 | Temp 98.5°F | Resp 15

## 2024-11-07 DIAGNOSIS — D500 Iron deficiency anemia secondary to blood loss (chronic): Secondary | ICD-10-CM | POA: Insufficient documentation

## 2024-11-07 MED ORDER — IRON SUCROSE 200 MG IVPB - SIMPLE MED
Status: AC
  Filled 2024-11-07: qty 110

## 2024-11-07 MED ORDER — IRON SUCROSE 200 MG IVPB - SIMPLE MED
200.0000 mg | Freq: Once | Status: AC
  Administered 2024-11-07: 200 mg via INTRAVENOUS

## 2024-11-10 ENCOUNTER — Ambulatory Visit (INDEPENDENT_AMBULATORY_CARE_PROVIDER_SITE_OTHER): Admitting: Family Medicine

## 2024-11-10 ENCOUNTER — Other Ambulatory Visit (HOSPITAL_COMMUNITY): Payer: Self-pay

## 2024-11-10 ENCOUNTER — Encounter (HOSPITAL_COMMUNITY): Payer: Self-pay | Admitting: Hematology and Oncology

## 2024-11-10 VITALS — BP 133/84 | HR 65 | Temp 98.0°F | Ht 64.0 in | Wt 172.0 lb

## 2024-11-10 DIAGNOSIS — E7849 Other hyperlipidemia: Secondary | ICD-10-CM | POA: Diagnosis not present

## 2024-11-10 DIAGNOSIS — E559 Vitamin D deficiency, unspecified: Secondary | ICD-10-CM | POA: Diagnosis not present

## 2024-11-10 DIAGNOSIS — R7303 Prediabetes: Secondary | ICD-10-CM | POA: Diagnosis not present

## 2024-11-10 DIAGNOSIS — Z6829 Body mass index (BMI) 29.0-29.9, adult: Secondary | ICD-10-CM | POA: Diagnosis not present

## 2024-11-10 MED ORDER — ZEPBOUND 10 MG/0.5ML ~~LOC~~ SOAJ
10.0000 mg | SUBCUTANEOUS | 0 refills | Status: AC
  Filled 2024-11-10: qty 2, 28d supply, fill #0

## 2024-11-10 MED ORDER — VITAMIN D (ERGOCALCIFEROL) 1.25 MG (50000 UNIT) PO CAPS
50000.0000 [IU] | ORAL_CAPSULE | ORAL | 0 refills | Status: AC
  Filled 2024-11-10: qty 4, 28d supply, fill #0

## 2024-11-10 NOTE — Assessment & Plan Note (Signed)
 Last labs done over 6 months ago.  She has been limited in her food intake due to holiday demands as well and illnesses that have circulated her home over the holiday season.

## 2024-11-10 NOTE — Assessment & Plan Note (Signed)
 Last LDL at goal as well as HDL.  This is better controlled than previously.  Will continue to work on dietary changes and limiting saturated fats to less than 20% of total daily intake.  FLP ordered today- will follow up on results at next appointment and risk stratify with next lab results.

## 2024-11-10 NOTE — Assessment & Plan Note (Signed)
 Last Vitamin D  level below goal.  She is on prescription strength Vitamin D .  No nausea, vomiting or muscle weakness.  Repeat Vitamin D  level ordered to reassess response to treatment.

## 2024-11-10 NOTE — Progress Notes (Signed)
 "  SUBJECTIVE:  Chief Complaint: Obesity  Interim History: Patient started iron  infusions since last appointment.  She is feeling tired and has a terrible headache today.  She is feeling drained overall. She has not been on plan but wants to get back to eating more nutritious foods.  She has restarted folate replacement.  Julie Mcneil is here to discuss her progress with her obesity treatment plan. She is on the Category 1 Plan and states she is following her eating plan approximately 50 % of the time. She states she is not exercising.   OBJECTIVE: Visit Diagnoses: Problem List Items Addressed This Visit       Other   Prediabetes   Relevant Orders   Comprehensive metabolic panel with GFR   Hemoglobin A1c   Insulin , random   Vitamin D  deficiency - Primary   Relevant Medications   Vitamin D , Ergocalciferol , (DRISDOL ) 1.25 MG (50000 UNIT) CAPS capsule   Other Relevant Orders   VITAMIN D  25 Hydroxy (Vit-D Deficiency, Fractures)   Morbid obesity (HCC)   Relevant Medications   tirzepatide  (ZEPBOUND ) 10 MG/0.5ML Pen   Hyperlipidemia   Relevant Orders   Lipid Panel With LDL/HDL Ratio   Other Visit Diagnoses       BMI 29.0-29.9,adult           Vitals Temp: 98 F (36.7 C) BP: 133/84 Pulse Rate: 65 SpO2: 100 %   Anthropometric Measurements Height: 5' 4 (1.626 m) Weight: 172 lb (78 kg) BMI (Calculated): 29.51 Weight at Last Visit: 157 lb Weight Lost Since Last Visit: 0 Weight Gained Since Last Visit: 15 Starting Weight: 273 lb   Body Composition  Body Fat %: 40.2 % Fat Mass (lbs): 69.4 lbs Muscle Mass (lbs): 97.8 lbs Total Body Water  (lbs): 85.8 lbs Visceral Fat Rating : 8   Other Clinical Data Today's Visit #: 31 Starting Date: 01/18/20 Comments: Cat 1     ASSESSMENT AND PLAN: Assessment & Plan Vitamin D  deficiency Last Vitamin D  level below goal.  She is on prescription strength Vitamin D .  No nausea, vomiting or muscle weakness.  Repeat Vitamin D   level ordered to reassess response to treatment.  Other hyperlipidemia Last LDL at goal as well as HDL.  This is better controlled than previously.  Will continue to work on dietary changes and limiting saturated fats to less than 20% of total daily intake.  FLP ordered today- will follow up on results at next appointment and risk stratify with next lab results. Prediabetes Last labs done over 6 months ago.  She has been limited in her food intake due to holiday demands as well and illnesses that have circulated her home over the holiday season.   BMI 29.0-29.9,adult  Morbid obesity (HCC)    Diet: Julie Mcneil is currently in the action stage of change. As such, her goal is to continue with weight loss efforts and has agreed to the Category 1 Plan.   Exercise:  For substantial health benefits, adults should do at least 150 minutes (2 hours and 30 minutes) a week of moderate-intensity, or 75 minutes (1 hour and 15 minutes) a week of vigorous-intensity aerobic physical activity, or an equivalent combination of moderate- and vigorous-intensity aerobic activity. Aerobic activity should be performed in episodes of at least 10 minutes, and preferably, it should be spread throughout the week.  Behavior Modification:  We discussed the following Behavioral Modification Strategies today: increasing lean protein intake, decreasing simple carbohydrates, increasing vegetables, meal planning and cooking strategies, keeping healthy  foods in the home, and planning for success. We discussed various medication options to help Julie Mcneil with her weight loss efforts and we both agreed to continue zepbound  at current 10mg .  Return in about 5 weeks (around 12/15/2024).   She was informed of the importance of frequent follow up visits to maximize her success with intensive lifestyle modifications for her multiple health conditions.  Attestation Statements:   Reviewed by clinician on day of visit: allergies,  medications, problem list, medical history, surgical history, family history, social history, and previous encounter notes.   Adelita Cho, MD "

## 2024-11-11 ENCOUNTER — Other Ambulatory Visit (HOSPITAL_COMMUNITY): Payer: Self-pay

## 2024-11-12 ENCOUNTER — Encounter (HOSPITAL_COMMUNITY): Payer: Self-pay | Admitting: Hematology and Oncology

## 2024-11-12 ENCOUNTER — Other Ambulatory Visit (HOSPITAL_COMMUNITY): Payer: Self-pay

## 2024-11-13 ENCOUNTER — Other Ambulatory Visit (HOSPITAL_COMMUNITY): Payer: Self-pay

## 2024-11-13 ENCOUNTER — Encounter (HOSPITAL_COMMUNITY): Payer: Self-pay | Admitting: Hematology and Oncology

## 2024-11-13 ENCOUNTER — Encounter: Payer: Self-pay | Admitting: Hematology and Oncology

## 2024-11-14 ENCOUNTER — Ambulatory Visit (HOSPITAL_COMMUNITY)
Admission: RE | Admit: 2024-11-14 | Discharge: 2024-11-14 | Disposition: A | Source: Ambulatory Visit | Attending: Hematology and Oncology

## 2024-11-14 ENCOUNTER — Other Ambulatory Visit: Payer: Self-pay

## 2024-11-14 VITALS — BP 104/72 | HR 74 | Temp 98.0°F | Resp 16

## 2024-11-14 DIAGNOSIS — D500 Iron deficiency anemia secondary to blood loss (chronic): Secondary | ICD-10-CM

## 2024-11-14 MED ORDER — IRON SUCROSE 200 MG IVPB - SIMPLE MED
Status: AC
  Filled 2024-11-14: qty 110

## 2024-11-14 MED ORDER — IRON SUCROSE 200 MG IVPB - SIMPLE MED
200.0000 mg | Freq: Once | Status: AC
  Administered 2024-11-14: 200 mg via INTRAVENOUS

## 2024-11-20 ENCOUNTER — Ambulatory Visit (HOSPITAL_COMMUNITY)
Admission: RE | Admit: 2024-11-20 | Discharge: 2024-11-20 | Disposition: A | Source: Ambulatory Visit | Attending: Hematology and Oncology

## 2024-11-20 VITALS — BP 133/82 | HR 67 | Temp 97.9°F | Resp 16

## 2024-11-20 DIAGNOSIS — D500 Iron deficiency anemia secondary to blood loss (chronic): Secondary | ICD-10-CM | POA: Diagnosis not present

## 2024-11-20 MED ORDER — IRON SUCROSE 200 MG IVPB - SIMPLE MED
Status: AC
  Filled 2024-11-20: qty 110

## 2024-11-20 MED ORDER — IRON SUCROSE 200 MG IVPB - SIMPLE MED
200.0000 mg | Freq: Once | Status: AC
  Administered 2024-11-20: 200 mg via INTRAVENOUS

## 2024-11-22 ENCOUNTER — Other Ambulatory Visit (HOSPITAL_COMMUNITY): Payer: Self-pay

## 2024-11-28 ENCOUNTER — Ambulatory Visit (HOSPITAL_COMMUNITY)
Admission: RE | Admit: 2024-11-28 | Discharge: 2024-11-28 | Disposition: A | Source: Ambulatory Visit | Attending: Hematology and Oncology

## 2024-11-28 VITALS — BP 120/79 | HR 67 | Temp 98.1°F | Resp 15

## 2024-11-28 DIAGNOSIS — D500 Iron deficiency anemia secondary to blood loss (chronic): Secondary | ICD-10-CM | POA: Diagnosis not present

## 2024-11-28 MED ORDER — IRON SUCROSE 200 MG IVPB - SIMPLE MED
Status: AC
  Filled 2024-11-28: qty 110

## 2024-11-28 MED ORDER — IRON SUCROSE 200 MG IVPB - SIMPLE MED
200.0000 mg | Freq: Once | Status: AC
  Administered 2024-11-28: 200 mg via INTRAVENOUS

## 2024-12-02 ENCOUNTER — Other Ambulatory Visit: Payer: Self-pay

## 2024-12-02 ENCOUNTER — Telehealth: Admitting: Physician Assistant

## 2024-12-02 ENCOUNTER — Other Ambulatory Visit (HOSPITAL_COMMUNITY): Payer: Self-pay

## 2024-12-02 DIAGNOSIS — J019 Acute sinusitis, unspecified: Secondary | ICD-10-CM | POA: Diagnosis not present

## 2024-12-02 DIAGNOSIS — B9689 Other specified bacterial agents as the cause of diseases classified elsewhere: Secondary | ICD-10-CM

## 2024-12-02 MED ORDER — DOXYCYCLINE HYCLATE 100 MG PO TABS
100.0000 mg | ORAL_TABLET | Freq: Two times a day (BID) | ORAL | 0 refills | Status: DC
  Filled 2024-12-02: qty 14, 7d supply, fill #0

## 2024-12-02 MED ORDER — AMOXICILLIN-POT CLAVULANATE 400-57 MG/5ML PO SUSR
800.0000 mg | Freq: Two times a day (BID) | ORAL | 0 refills | Status: AC
  Filled 2024-12-02: qty 200, 7d supply, fill #0

## 2024-12-02 MED ORDER — AZELASTINE HCL 0.1 % NA SOLN
2.0000 | Freq: Two times a day (BID) | NASAL | 0 refills | Status: AC
  Filled 2024-12-02: qty 30, 30d supply, fill #0

## 2024-12-02 MED ORDER — AMOXICILLIN-POT CLAVULANATE 875-125 MG PO TABS
1.0000 | ORAL_TABLET | Freq: Two times a day (BID) | ORAL | 0 refills | Status: DC
  Filled 2024-12-02: qty 14, 7d supply, fill #0

## 2024-12-02 NOTE — Addendum Note (Signed)
 Addended by: Ovella Manygoats M on: 12/02/2024 11:52 AM   Modules accepted: Orders

## 2024-12-02 NOTE — Addendum Note (Signed)
 Addended by: VIVIENNE FORTUNATO HERO on: 12/02/2024 12:08 PM   Modules accepted: Orders

## 2024-12-02 NOTE — Progress Notes (Signed)
 We are sorry that you are not feeling well.  Here is how we plan to help!  Based on what you have shared with me it looks like you have sinusitis.  Sinusitis is inflammation and infection in the sinus cavities of the head.  Based on your presentation I believe you most likely have Acute Viral Sinusitis.This is an infection most likely caused by a virus. There is not specific treatment for viral sinusitis other than to help you with the symptoms until the infection runs its course.  Antibiotics are not recommended by the Infect[employer] Society of [place] unless you have severe symptoms (including high fever) or you have symptoms for more than 10 days. If you still have symptoms after 10 days, antibiotics should be considered.    You may use an oral decongestant such as Mucinex D or if you have glaucoma or high blood pressure use plain Mucinex.   Saline nasal spray help and can safely be used as often as needed for congestion. Try using saline irrigation, such as with a neti pot, several times a day while you are sick. Many neti pots come with salt packets premeasured to use to make saline. If you use your own salt, make sure it is kosher salt or sea salt (don't use table salt as it has iodine in it and you don't need that in your nose). Use distilled water to make saline. If you mix your own saline using your own salt, the recipe is 1/4 teaspoon salt in 1 cup warm water. Using saline irrigation can help prevent and treat sinus infections.     I have prescribed: Azelastine  nasal spray 2 sprays in each nostril twice a day  Some authorities believe that zinc sprays or the use of Echinacea may shorten the course of your symptoms.  Sinus infections are not as easily transmitted as other respiratory infection, however we still recommend that you avoid close contact with loved ones, especially the very young and elderly.  Remember to wash your hands thoroughly throughout the day as this is the number one way  to prevent the spread of infection!  Home Care: Only take medications as instructed by your medical team. Do not take these medications with alcohol. A steam or ultrasonic humidifier can help congestion.  You can place a towel over your head and breathe in the steam from hot water coming from a faucet. Avoid close contacts especially the very young and the elderly. Cover your mouth when you cough or sneeze. Always remember to wash your hands.  Get Help Right Away If: You develop worsening fever or sinus pain. You develop a severe head ache or visual changes. Your symptoms persist after you have completed your treatment plan.  Make sure you Understand these instructions. Will watch your condition. Will get help right away if you are not doing well or get worse.  Your e-visit answers were reviewed by a board certified advanced clinical practitioner to complete your personal care plan.  Depending on the condition, your plan could have included both over the counter or prescription medications.  If there is a problem please reply  once you have received a response from your provider.  Your safety is important to us .  If you have drug allergies check your prescription carefully.    You can use MyChart to ask questions about today's visit, request a non-urgent call back, or ask for a work or school excuse for 24 hours related to this e-Visit. If it  has been greater than 24 hours you will need to follow up with your provider, or enter a new e-Visit to address those concerns.  You will get an e-mail in the next two days asking about your experience.  I hope that your e-visit has been valuable and will speed your recovery. Thank you for using e-visits.  I have spent 5 minutes in review of e-visit questionnaire, review and updating patient chart, medical decision making and response to patient.   Jon Belt, PhD, FNP-BC

## 2024-12-02 NOTE — Addendum Note (Signed)
 Addended by: VIVIENNE FORTUNATO HERO on: 12/02/2024 12:10 PM   Modules accepted: Orders

## 2024-12-05 ENCOUNTER — Telehealth: Payer: Self-pay | Admitting: Hematology and Oncology

## 2024-12-05 NOTE — Telephone Encounter (Signed)
 Called pt to inform of time and provider change for upcoming appt.

## 2024-12-12 ENCOUNTER — Inpatient Hospital Stay: Attending: Hematology and Oncology

## 2024-12-12 ENCOUNTER — Other Ambulatory Visit: Payer: Self-pay | Admitting: Physician Assistant

## 2024-12-12 ENCOUNTER — Encounter (INDEPENDENT_AMBULATORY_CARE_PROVIDER_SITE_OTHER): Payer: Self-pay | Admitting: Family Medicine

## 2024-12-12 DIAGNOSIS — D500 Iron deficiency anemia secondary to blood loss (chronic): Secondary | ICD-10-CM

## 2024-12-12 LAB — IRON AND IRON BINDING CAPACITY (CC-WL,HP ONLY)
Iron: 49 ug/dL (ref 28–170)
Saturation Ratios: 13 % (ref 10.4–31.8)
TIBC: 395 ug/dL (ref 250–450)
UIBC: 345 ug/dL

## 2024-12-12 LAB — CBC WITH DIFFERENTIAL (CANCER CENTER ONLY)
Abs Immature Granulocytes: 0.01 10*3/uL (ref 0.00–0.07)
Basophils Absolute: 0.1 10*3/uL (ref 0.0–0.1)
Basophils Relative: 1 %
Eosinophils Absolute: 0.2 10*3/uL (ref 0.0–0.5)
Eosinophils Relative: 3 %
HCT: 35.9 % — ABNORMAL LOW (ref 36.0–46.0)
Hemoglobin: 11.3 g/dL — ABNORMAL LOW (ref 12.0–15.0)
Immature Granulocytes: 0 %
Lymphocytes Relative: 39 %
Lymphs Abs: 2.2 10*3/uL (ref 0.7–4.0)
MCH: 25.7 pg — ABNORMAL LOW (ref 26.0–34.0)
MCHC: 31.5 g/dL (ref 30.0–36.0)
MCV: 81.6 fL (ref 80.0–100.0)
Monocytes Absolute: 0.4 10*3/uL (ref 0.1–1.0)
Monocytes Relative: 7 %
Neutro Abs: 2.8 10*3/uL (ref 1.7–7.7)
Neutrophils Relative %: 50 %
Platelet Count: 323 10*3/uL (ref 150–400)
RBC: 4.4 MIL/uL (ref 3.87–5.11)
RDW: 22.5 % — ABNORMAL HIGH (ref 11.5–15.5)
WBC Count: 5.6 10*3/uL (ref 4.0–10.5)
nRBC: 0 % (ref 0.0–0.2)

## 2024-12-12 LAB — CMP (CANCER CENTER ONLY)
ALT: 6 U/L (ref 0–44)
AST: 17 U/L (ref 15–41)
Albumin: 4.5 g/dL (ref 3.5–5.0)
Alkaline Phosphatase: 89 U/L (ref 38–126)
Anion gap: 10 (ref 5–15)
BUN: 12 mg/dL (ref 6–20)
CO2: 27 mmol/L (ref 22–32)
Calcium: 9.4 mg/dL (ref 8.9–10.3)
Chloride: 103 mmol/L (ref 98–111)
Creatinine: 0.73 mg/dL (ref 0.44–1.00)
GFR, Estimated: >60 mL/min
Glucose, Bld: 78 mg/dL (ref 70–99)
Potassium: 4.3 mmol/L (ref 3.5–5.1)
Sodium: 139 mmol/L (ref 135–145)
Total Bilirubin: 0.7 mg/dL (ref 0.0–1.2)
Total Protein: 7.5 g/dL (ref 6.5–8.1)

## 2024-12-12 LAB — FERRITIN: Ferritin: 67 ng/mL (ref 11–307)

## 2024-12-18 ENCOUNTER — Ambulatory Visit (INDEPENDENT_AMBULATORY_CARE_PROVIDER_SITE_OTHER): Admitting: Family Medicine

## 2024-12-19 ENCOUNTER — Inpatient Hospital Stay: Admitting: Hematology and Oncology

## 2024-12-19 ENCOUNTER — Inpatient Hospital Stay: Attending: Hematology and Oncology | Admitting: Nurse Practitioner

## 2025-01-09 ENCOUNTER — Inpatient Hospital Stay

## 2025-01-16 ENCOUNTER — Inpatient Hospital Stay: Admitting: Hematology and Oncology
